# Patient Record
Sex: Female | Born: 1940
Health system: Southern US, Community
[De-identification: ages and names within clinical notes are randomized; demographics above are authoritative.]

## PROBLEM LIST (undated history)

## (undated) DIAGNOSIS — M069 Rheumatoid arthritis, unspecified: Secondary | ICD-10-CM

## (undated) DIAGNOSIS — K279 Peptic ulcer, site unspecified, unspecified as acute or chronic, without hemorrhage or perforation: Secondary | ICD-10-CM

## (undated) DIAGNOSIS — Z9289 Personal history of other medical treatment: Secondary | ICD-10-CM

## (undated) DIAGNOSIS — K635 Polyp of colon: Secondary | ICD-10-CM

## (undated) DIAGNOSIS — Z9221 Personal history of antineoplastic chemotherapy: Secondary | ICD-10-CM

## (undated) DIAGNOSIS — Z79811 Long term (current) use of aromatase inhibitors: Secondary | ICD-10-CM

## (undated) DIAGNOSIS — M1990 Unspecified osteoarthritis, unspecified site: Secondary | ICD-10-CM

## (undated) DIAGNOSIS — E039 Hypothyroidism, unspecified: Secondary | ICD-10-CM

## (undated) DIAGNOSIS — D649 Anemia, unspecified: Secondary | ICD-10-CM

## (undated) DIAGNOSIS — E785 Hyperlipidemia, unspecified: Secondary | ICD-10-CM

## (undated) DIAGNOSIS — I10 Essential (primary) hypertension: Secondary | ICD-10-CM

## (undated) DIAGNOSIS — K7689 Other specified diseases of liver: Secondary | ICD-10-CM

## (undated) DIAGNOSIS — Z923 Personal history of irradiation: Secondary | ICD-10-CM

## (undated) DIAGNOSIS — B9681 Helicobacter pylori [H. pylori] as the cause of diseases classified elsewhere: Secondary | ICD-10-CM

## (undated) DIAGNOSIS — C50919 Malignant neoplasm of unspecified site of unspecified female breast: Secondary | ICD-10-CM

## (undated) DIAGNOSIS — L02429 Furuncle of limb, unspecified: Secondary | ICD-10-CM

## (undated) DIAGNOSIS — I2510 Atherosclerotic heart disease of native coronary artery without angina pectoris: Secondary | ICD-10-CM

## (undated) DIAGNOSIS — G4730 Sleep apnea, unspecified: Secondary | ICD-10-CM

## (undated) DIAGNOSIS — E0500 Thyrotoxicosis with diffuse goiter without thyrotoxic crisis or storm: Secondary | ICD-10-CM

## (undated) DIAGNOSIS — R011 Cardiac murmur, unspecified: Secondary | ICD-10-CM

## (undated) DIAGNOSIS — Z8719 Personal history of other diseases of the digestive system: Secondary | ICD-10-CM

## (undated) DIAGNOSIS — M479 Spondylosis, unspecified: Secondary | ICD-10-CM

## (undated) DIAGNOSIS — K219 Gastro-esophageal reflux disease without esophagitis: Secondary | ICD-10-CM

## (undated) DIAGNOSIS — R7303 Prediabetes: Secondary | ICD-10-CM

## (undated) HISTORY — DX: Atherosclerotic heart disease of native coronary artery without angina pectoris: I25.10

## (undated) HISTORY — PX: PARTIAL HYSTERECTOMY: SHX80

## (undated) HISTORY — DX: Personal history of antineoplastic chemotherapy: Z92.21

## (undated) HISTORY — DX: Personal history of irradiation: Z92.3

## (undated) HISTORY — DX: Essential (primary) hypertension: I10

## (undated) HISTORY — PX: OTHER SURGICAL HISTORY: SHX169

## (undated) HISTORY — DX: Polyp of colon: K63.5

## (undated) HISTORY — PX: CARPAL TUNNEL RELEASE: SHX101

## (undated) HISTORY — DX: Hypothyroidism, unspecified: E03.9

## (undated) HISTORY — PX: CATARACT EXTRACTION, BILATERAL: SHX1313

## (undated) HISTORY — PX: LUMBAR LAMINECTOMY: SHX95

## (undated) HISTORY — DX: Long term (current) use of aromatase inhibitors: Z79.811

## (undated) HISTORY — DX: Sleep apnea, unspecified: G47.30

## (undated) HISTORY — PX: ABDOMINAL HYSTERECTOMY: SHX81

## (undated) HISTORY — PX: CARDIAC CATHETERIZATION: SHX172

## (undated) HISTORY — DX: Furuncle of limb, unspecified: L02.429

---

## 1996-12-21 HISTORY — PX: BREAST BIOPSY: SHX20

## 1998-09-17 ENCOUNTER — Encounter: Payer: Self-pay | Admitting: *Deleted

## 1998-09-17 ENCOUNTER — Ambulatory Visit (HOSPITAL_COMMUNITY): Admission: RE | Admit: 1998-09-17 | Discharge: 1998-09-17 | Payer: Self-pay | Admitting: *Deleted

## 1998-09-24 ENCOUNTER — Encounter: Payer: Self-pay | Admitting: *Deleted

## 1998-09-24 ENCOUNTER — Ambulatory Visit (HOSPITAL_COMMUNITY): Admission: RE | Admit: 1998-09-24 | Discharge: 1998-09-24 | Payer: Self-pay | Admitting: *Deleted

## 1999-01-27 ENCOUNTER — Ambulatory Visit (HOSPITAL_COMMUNITY): Admission: RE | Admit: 1999-01-27 | Discharge: 1999-01-27 | Payer: Self-pay | Admitting: *Deleted

## 1999-01-30 ENCOUNTER — Encounter: Payer: Self-pay | Admitting: *Deleted

## 1999-01-30 ENCOUNTER — Ambulatory Visit (HOSPITAL_COMMUNITY): Admission: RE | Admit: 1999-01-30 | Discharge: 1999-01-30 | Payer: Self-pay | Admitting: *Deleted

## 1999-01-31 ENCOUNTER — Encounter: Payer: Self-pay | Admitting: *Deleted

## 1999-01-31 ENCOUNTER — Ambulatory Visit (HOSPITAL_COMMUNITY): Admission: RE | Admit: 1999-01-31 | Discharge: 1999-01-31 | Payer: Self-pay | Admitting: *Deleted

## 1999-03-17 ENCOUNTER — Ambulatory Visit (HOSPITAL_COMMUNITY): Admission: RE | Admit: 1999-03-17 | Discharge: 1999-03-17 | Payer: Self-pay | Admitting: *Deleted

## 2000-02-11 ENCOUNTER — Emergency Department (HOSPITAL_COMMUNITY): Admission: EM | Admit: 2000-02-11 | Discharge: 2000-02-11 | Payer: Self-pay | Admitting: Emergency Medicine

## 2000-04-15 ENCOUNTER — Encounter: Payer: Self-pay | Admitting: Gynecology

## 2000-04-15 ENCOUNTER — Ambulatory Visit (HOSPITAL_COMMUNITY): Admission: RE | Admit: 2000-04-15 | Discharge: 2000-04-15 | Payer: Self-pay | Admitting: Gynecology

## 2000-10-06 ENCOUNTER — Other Ambulatory Visit: Admission: RE | Admit: 2000-10-06 | Discharge: 2000-10-06 | Payer: Self-pay | Admitting: Gynecology

## 2001-03-23 ENCOUNTER — Ambulatory Visit (HOSPITAL_COMMUNITY): Admission: RE | Admit: 2001-03-23 | Discharge: 2001-03-23 | Payer: Self-pay | Admitting: Gynecology

## 2001-03-23 ENCOUNTER — Encounter: Payer: Self-pay | Admitting: Gynecology

## 2001-10-10 ENCOUNTER — Other Ambulatory Visit: Admission: RE | Admit: 2001-10-10 | Discharge: 2001-10-10 | Payer: Self-pay | Admitting: Gynecology

## 2001-12-26 ENCOUNTER — Encounter: Payer: Self-pay | Admitting: Internal Medicine

## 2001-12-26 ENCOUNTER — Encounter: Admission: RE | Admit: 2001-12-26 | Discharge: 2001-12-26 | Payer: Self-pay | Admitting: Internal Medicine

## 2002-02-17 ENCOUNTER — Emergency Department (HOSPITAL_COMMUNITY): Admission: EM | Admit: 2002-02-17 | Discharge: 2002-02-17 | Payer: Self-pay | Admitting: Emergency Medicine

## 2002-02-17 ENCOUNTER — Encounter: Payer: Self-pay | Admitting: Emergency Medicine

## 2002-04-21 ENCOUNTER — Ambulatory Visit (HOSPITAL_COMMUNITY): Admission: RE | Admit: 2002-04-21 | Discharge: 2002-04-21 | Payer: Self-pay | Admitting: Gynecology

## 2002-04-21 ENCOUNTER — Encounter: Payer: Self-pay | Admitting: Gynecology

## 2003-01-18 ENCOUNTER — Other Ambulatory Visit: Admission: RE | Admit: 2003-01-18 | Discharge: 2003-01-18 | Payer: Self-pay | Admitting: Gynecology

## 2003-11-05 ENCOUNTER — Ambulatory Visit (HOSPITAL_COMMUNITY): Admission: RE | Admit: 2003-11-05 | Discharge: 2003-11-05 | Payer: Self-pay | Admitting: Gynecology

## 2003-12-03 ENCOUNTER — Ambulatory Visit (HOSPITAL_COMMUNITY): Admission: RE | Admit: 2003-12-03 | Discharge: 2003-12-03 | Payer: Self-pay | Admitting: Internal Medicine

## 2004-02-13 ENCOUNTER — Ambulatory Visit (HOSPITAL_COMMUNITY): Admission: RE | Admit: 2004-02-13 | Discharge: 2004-02-13 | Payer: Self-pay | Admitting: Specialist

## 2004-04-07 ENCOUNTER — Other Ambulatory Visit: Admission: RE | Admit: 2004-04-07 | Discharge: 2004-04-07 | Payer: Self-pay | Admitting: Gynecology

## 2004-05-02 ENCOUNTER — Encounter: Admission: RE | Admit: 2004-05-02 | Discharge: 2004-05-02 | Payer: Self-pay | Admitting: Cardiology

## 2004-05-07 ENCOUNTER — Ambulatory Visit (HOSPITAL_COMMUNITY): Admission: RE | Admit: 2004-05-07 | Discharge: 2004-05-07 | Payer: Self-pay | Admitting: Cardiology

## 2004-12-21 DIAGNOSIS — Z9289 Personal history of other medical treatment: Secondary | ICD-10-CM

## 2004-12-21 HISTORY — DX: Personal history of other medical treatment: Z92.89

## 2005-04-08 ENCOUNTER — Ambulatory Visit (HOSPITAL_COMMUNITY): Admission: RE | Admit: 2005-04-08 | Discharge: 2005-04-08 | Payer: Self-pay | Admitting: Specialist

## 2005-04-16 ENCOUNTER — Other Ambulatory Visit: Admission: RE | Admit: 2005-04-16 | Discharge: 2005-04-16 | Payer: Self-pay | Admitting: Gynecology

## 2005-05-08 ENCOUNTER — Ambulatory Visit (HOSPITAL_COMMUNITY): Admission: RE | Admit: 2005-05-08 | Discharge: 2005-05-08 | Payer: Self-pay | Admitting: Gynecology

## 2005-09-18 ENCOUNTER — Ambulatory Visit (HOSPITAL_BASED_OUTPATIENT_CLINIC_OR_DEPARTMENT_OTHER): Admission: RE | Admit: 2005-09-18 | Discharge: 2005-09-18 | Payer: Self-pay | Admitting: Internal Medicine

## 2005-09-27 ENCOUNTER — Ambulatory Visit: Payer: Self-pay | Admitting: Internal Medicine

## 2005-11-02 ENCOUNTER — Ambulatory Visit: Payer: Self-pay | Admitting: Internal Medicine

## 2006-07-28 ENCOUNTER — Inpatient Hospital Stay (HOSPITAL_COMMUNITY): Admission: RE | Admit: 2006-07-28 | Discharge: 2006-08-02 | Payer: Self-pay | Admitting: Orthopedic Surgery

## 2006-07-28 HISTORY — PX: TOTAL KNEE ARTHROPLASTY: SHX125

## 2007-08-23 ENCOUNTER — Encounter: Admission: RE | Admit: 2007-08-23 | Discharge: 2007-08-23 | Payer: Self-pay | Admitting: Gynecology

## 2007-09-05 ENCOUNTER — Other Ambulatory Visit: Admission: RE | Admit: 2007-09-05 | Discharge: 2007-09-05 | Payer: Self-pay | Admitting: Gynecology

## 2007-12-22 HISTORY — PX: BILATERAL OOPHORECTOMY: SHX1221

## 2008-08-14 HISTORY — PX: US ECHOCARDIOGRAPHY: HXRAD669

## 2008-11-09 ENCOUNTER — Encounter: Admission: RE | Admit: 2008-11-09 | Discharge: 2008-11-09 | Payer: Self-pay | Admitting: Internal Medicine

## 2009-08-08 ENCOUNTER — Ambulatory Visit (HOSPITAL_COMMUNITY): Admission: RE | Admit: 2009-08-08 | Discharge: 2009-08-08 | Payer: Self-pay | Admitting: Internal Medicine

## 2009-10-24 ENCOUNTER — Encounter: Admission: RE | Admit: 2009-10-24 | Discharge: 2009-11-22 | Payer: Self-pay | Admitting: Orthopedic Surgery

## 2010-02-21 ENCOUNTER — Ambulatory Visit (HOSPITAL_COMMUNITY): Admission: RE | Admit: 2010-02-21 | Discharge: 2010-02-21 | Payer: Self-pay | Admitting: Cardiology

## 2010-04-10 ENCOUNTER — Ambulatory Visit (HOSPITAL_COMMUNITY): Admission: RE | Admit: 2010-04-10 | Discharge: 2010-04-10 | Payer: Self-pay | Admitting: Cardiology

## 2011-03-10 LAB — BASIC METABOLIC PANEL
BUN: 16 mg/dL (ref 6–23)
CO2: 27 mEq/L (ref 19–32)
Calcium: 9.5 mg/dL (ref 8.4–10.5)
Chloride: 102 mEq/L (ref 96–112)
Creatinine, Ser: 0.87 mg/dL (ref 0.4–1.2)
GFR calc Af Amer: >60 mL/min (ref >60)
GFR calc non Af Amer: >60 mL/min (ref >60)
Glucose, Bld: 127 mg/dL — ABNORMAL HIGH (ref 70–99)
Potassium: 3.4 mEq/L — ABNORMAL LOW (ref 3.5–5.1)
Sodium: 138 mEq/L (ref 135–145)

## 2011-03-10 LAB — CBC
HCT: 39.4 % (ref 36.0–46.0)
Hemoglobin: 13.6 g/dL (ref 12.0–15.0)
MCHC: 34.6 g/dL (ref 30.0–36.0)
MCV: 89.9 fL (ref 78.0–100.0)
Platelets: 270 10*3/uL (ref 150–400)
RBC: 4.38 MIL/uL (ref 3.87–5.11)
RDW: 14.4 % (ref 11.5–15.5)
WBC: 6.2 10*3/uL (ref 4.0–10.5)

## 2011-03-10 LAB — PROTIME-INR
INR: 1.02 (ref 0.00–1.49)
Prothrombin Time: 13.3 seconds (ref 11.6–15.2)

## 2011-03-10 LAB — TSH: TSH: 2.87 u[IU]/mL (ref 0.350–4.500)

## 2011-03-10 LAB — GLUCOSE, CAPILLARY: Glucose-Capillary: 130 mg/dL — ABNORMAL HIGH (ref 70–99)

## 2011-05-08 NOTE — Discharge Summary (Signed)
[name], [name]                   ACCOUNT NO.:  [id number]   MEDICAL RECORD NO.:  [id number]          PATIENT TYPE:  INP   LOCATION:  1516                         FACILITY:  [facility]   PHYSICIAN:  Georges Lynch. Gioffre, M.D.DATE OF BIRTH:  21-Apr-1941   DATE OF ADMISSION:  07/28/2006  DATE OF DISCHARGE:  08/02/2006                                 DISCHARGE SUMMARY   ADMISSION DIAGNOSES:  1. End-stage osteoarthritis right knee.  2. Severe osteoarthritis left knee.  3. Asthma.  4. Hypertension.  5. Reflux disease.  6. Graves disease.   DISCHARGE DIAGNOSES:  1. Right total knee arthroplasty.  2. Postoperative blood loss anemia; corrected with 2 units of packed red      blood cells.  3. Severe osteoarthritis left knee.  4. Asthma.  5. Hypertension.  6. Reflux disease.  7. Graves disease.   HISTORY OF PRESENT ILLNESS:  The patient is a 70 year old female who has  been a patient of Dr. Jeannetta Ellis for a long period of time.  She has been  treated by conservative measures for severe bilateral knee osteoarthritis.  The patient has failed conservative treatment with her right knee and would  like to proceed with a total knee arthroplasty.  She has pain with range of  motion, ambulation, and grinding in the knee.   ALLERGIES:  Zocor.   CURRENT MEDICATIONS:  1. Aspirin 81 mg a day.  2. Benicar 20 mg a day.  3. Lipitor 20 mg a day.  4. Taztia 240 a day.  5. Prevacid.  6. Triamterene/hydrochlorothiazide 37.5/25 mg a day.  7. Tapazole 10 mg 1 tablet t.i.d.  8. Ativan.  9. Albuterol.   SURGICAL PROCEDURE:  On July 28, 2006 the patient was taken to the OR by  Dr. Ranee Gosselin assisted by Jamelle Rushing, P.A.-C.  Patient underwent a  right total knee arthroplasty under general anesthesia without any  complications with 50 mL of estimated blood loss.  The patient had the  following components implanted.  A size 4 femoral component. A size 4 keel  tibial tray.  A size 35-mm, 3-peg  patella.  A size 17.5 polyethelene  component.  All components were implanted with polymethyl methacrylate with  vancomycin impregnated.  The patient tolerated the procedure well and was  transferred to the recovery room to the orthopedic floor in good condition  with the total knee protocol.   CONSULTS:  The following routine consults were performed:  Physical therapy,  occupational therapy, and case management.   HOSPITAL COURSE:  On July 28, 2006 the patient was admitted to [facility] under the care of Dr. Ranee Gosselin.  The patient was taken to the  OR where a right total knee arthroplasty was performed without  complications.  The patient was transferred to the recovery room and then to  the orthopedic floor in good condition on total knee protocol, IV  antibiotics, pain medicines, Coumadin, and heparin for DVT prophylaxis.  The  patient had a total of 5 days postoperative course in which the patient did  have some  significant postoperative blood loss anemia.  Her hemoglobin  dropped to 7.8.  She was typed and crossed and transfused 2 units of packed  red blood cells.  Her hemoglobin improved to 9.5 without any untoward  events.  The patient's vital signs remained stable.  She had no other  symptomatic issues.  The patient's wound remained benign for any signs of  infection.  The leg remained neuromotorvascularly intact.  The patient  worked well with physical therapy.  The patient was able to transition from  IV medications to p.o. meds well without any issues.  It was felt on postop  day #5 that the patient was orthopedically and medically stable, and ready  for discharge home and she was discharged with outpatient home health  physical therapy per protocol.   LABS:  CBC on August 11 found her hemoglobin at 7.8.  She was typed,  crossed, and transfused 2 units of packed red blood cells.  The next day she  was up to 9.5 and 28.1.  INR on August 13 was 2.2.  Routine  chemistries on  admission: Sodium 138, potassium 3.9, __________ 15, BUN 29, creatinine  1.11.  Urinalysis preop and postop were normal.  Culture found 15,000  colonies, multiple bacteria.  The patient was typed, crossed, and transfused  2 units of packed red blood cells.  Postoperative knee x-ray showed good  position and alignment following total knee arthroplasty.   MEDICATIONS UPON DISCHARGE:  1. __________ 150 mg a day.  2. Zocor 40 mg a day.  3. Diltiazem 240 mg a day.  4. Protonix 40 mg a day.  5. Maxzide 1 tablet a day.  6. Albuterol 2 puffs a day.  7. Reglan 10 mg q.6 h. p.r.n.  8. Ferrous sulfate 325 mg p.o. t.i.d.  9. Percocet 1-2 tablets q.4-6 h. p.r.n.  10.Robaxin 500 mg p.o. q.6 h. p.r.n.  11.Advair 1 puff b.i.d.  12.Tapazole 10 mg p.o. t.i.d.  13.Lipitor 20 mg a day.  14.Tylenol 650 mg p.o. q.6 h. p.r.n.  15.Laxative or enema of choice p.r.n.  16.Coumadin 5 mg a day.   DISCHARGE INSTRUCTIONS:  1. Diet--no restrictions.  2. Activity--the patient is to be ambulating with the use of a walker and      with total knee safety protocol.  3. Wound care--the patient should change dressing daily.   MEDICATION ADDITION:  The patient is to continue meds as dispensed prior to  hospitalization, with the addition of:  1. Percocet 1 or 2 tablets q.4-6 h.  2. Robaxin 500 mg 1 tablet q.6 h. p.r.n.  3. Coumadin 5 mg a day unless changed by home health physical therapy.   FOLLOWUP:  1. The patient should followup with Dr. Darrelyn Hillock 2 weeks from date of      surgery, please call for an appointment.  2. Outpatient home health physical therapy provided by Genevieve Norlander per      physical therapy and Coumadin management per protocol.  3. The patient's condition on discharge home is improved and good.      Jamelle Rushing, P.A.    ______________________________  Georges Lynch Darrelyn Hillock, M.D.   RWK/MEDQ  D:  08/11/2006  T:  08/12/2006  Job:  [id number]

## 2011-05-08 NOTE — Op Note (Signed)
[name], [name]                   ACCOUNT NO.:  [id number]   MEDICAL RECORD NO.:  [id number]          PATIENT TYPE:  INP   LOCATION:  0002                         [facility]ACILITY:  [facility]   PHYSICIAN:  Georges Lynch. Gioffre, M.D.DATE OF BIRTH:  05-30-1941   DATE OF PROCEDURE:  DATE OF DISCHARGE:                                 OPERATIVE REPORT   DATE OF SURGERY:  07/28/2006.   SURGEON:  Georges Lynch. Darrelyn Hillock, M.D.   ASSISTANT:  Darrol Poke, PA.   PREOPERATIVE DIAGNOSES:  Severe degenerative arthritis, right knee.   POSTOPERATIVE DIAGNOSES:  Severe degenerative arthritis, right knee.   PROCEDURE PERFORMED:  Right total knee arthroplasty utilizing DePuy system,  all three components were cemented.  The tibial tray was a size 4, the  femoral component was a size 4, right posterior cruciate sacrificing type.  The polyethylene insert was a size 4 rotating platform 17.5 mm thickness.  The patella was a size 35 patella.  Vancomycin was used in the cement.   PROCEDURE:  Under general anesthesia, routine orthopedic prepping and  draping of the right lower extremity was carried out.  She had 1 gram of IV  Ancef.  At this time the leg was exsanguinated with an Esmarch.  Tourniquet  was elevated to 400 mm/Hg.  At this time, an incision was made over the  anterior aspect of the right knee.  Bleeders were identified, cauterized.  We then inserted self retaining retractors, carried out a median  parapatellar incision.  Following that, we reflected the patella laterally,  flexed the knee and did medial and lateral meniscectomies.  We also did an  extensive synovectomy and also removed multiple large spurs over the  patella, femur and tibia.  Once this was completed, we made our initial  drill hole in the intercondylar notch.  #1 jig was inserted. We removed 12  mm thickness off the distal femur because of a flexion contracture.  After  this cut was made, we then inserted our #2 jig for a size 4 right  femur  after the appropriate measurements were taken, and made our appropriate  anterior and posterior Chamfer cuts.  Following that, we the prepared the  tibia in the usual fashion.  Our key hole cut was made.  We utilized a size  4 tibial tray.  We then made our cut out of the distal femur in the usual  fashion.  We then inserted our trial components, went through range of  motion, had excellent stability.  We first tried a 15 mm.  It was somewhat  loose.  Then we utilized at 17.5 mm thickness which fit quite nicely.  We  then prepared our patella.  We measured the patella to be a size 35.  We  removed the appropriate amount of bone from the patella for resurfacing.  At  this time three drill holes then were made in the patella in the usual  fashion for a 35 mm patella.  All trial components were removed, thoroughly  Water-Picked out the knee.  We mixed Vancomycin with the cement, dried  the  knee out and then cemented all three components in simultaneously, removed  all loose pieces of cement.  We then went through trials again with a 17.5  mm thickness insert and it really fit quit nicely.  We had good stability,  good flexion, good extension.  We then inserted the Hemovac drain which was  later pulled out at the end of the procedure.  We then closed the wound in  layers in the usual fashion.  Skin was closed with metal staples, sterile  Neosporin dressing was applied.           ______________________________  Georges Lynch Darrelyn Hillock, M.D.     RAG/MEDQ  D:  07/28/2006  T:  07/28/2006  Job:  [id number]

## 2011-05-08 NOTE — H&P (Signed)
[name], [name]                   ACCOUNT NO.:  [id number]   MEDICAL RECORD NO.:  [id number]          PATIENT TYPE:  INP   LOCATION:  NA                           FACILITY:  [facility]   PHYSICIAN:  Georges Lynch. Gioffre, M.D.DATE OF BIRTH:  1941-06-15   DATE OF ADMISSION:  07/28/2006  DATE OF DISCHARGE:                                HISTORY & PHYSICAL   CHIEF COMPLAINT:  Painful right knee.   HISTORY OF PRESENT ILLNESS:  The patient is a 70 year old female who has  been a patient of Dr. Jeannetta Ellis for a long period of time.  He has been  monitoring and treating her for severe bilateral arthritis.  She has pain  with ambulation, range of motion, she does have grinding.  She is currently  wishing to proceed with a total knee arthroplasty due to her arthritic  issues in her right knee, which is severely symptomatic, her left has got  bad arthritis, but it is not as symptomatic.   ALLERGIES:  ZOCOR.   CURRENT MEDICATIONS:  1.  Aspirin 81 mg a day, currently on hold.  2.  Benicar 20 mg a day.  3.  Lipitor 20 mg a day.  4.  Taztia 240 mg a day, generic for Cardizem.  5.  Prevacid 30 mg a day.  6.  Triamterene/hydrochlorothiazide 37.5/25 mg a day.  7.  Tapazole 10 mg one tablet t.i.d., a Synthroid type medication.  8.  Ativan p.r.n.  9.  Albuterol p.r.n.   CURRENT MEDICAL HISTORY:  1.  Severe osteoarthritis bilateral knees.  2.  Asthma.  3.  Hypertension.  4.  Graves' disease.   PAST SURGICAL HISTORY:  1.  Lumbar surgery x2.  2.  Hysterectomy.  3.  Bilateral cataracts.  4.  Patient denies any complications with the above-mentioned surgical      procedures.   SOCIAL HISTORY:  Patient's father is deceased from an MI, patient's mother  is deceased from complications of CHF, patient has got two brothers with  coronary artery disease and CABGs, one sister with diabetes.   SOCIAL HISTORY:  Patient is not married.  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  She does not smoke or use  alcohol.  She has no children.  She lives in  a one story house.   PRIMARY CARE PHYSICIAN:  Mark A. Perini, M.D.   PULMONOLOGIST:  Cristal Deer, M.D.   REVIEW OF SYSTEMS:  Positive for occasional flare-up of her asthma related  to dust or cologne type sprays.  She has recently had her blood pressure  medications adjusted due to a drop in blood pressure when she uses all her  meds at the same time.  She is currently adjusting medications for her  Graves' disease.  Patient otherwise denies any cardiac, GI, GU, neurologic,  hematologic, pulmonary or endocrine issues that were not indicated above or  related to her current medications.   PHYSICAL EXAM:  VITALS:  Height is 5 feet 5 inches, weight is 262, blood  pressure is 162/78.  Patient indicates she did  not take her blood pressure  medication today.  Pulse of 84 and regular, respirations 12, patient is  afebrile.  GENERAL:  This is a heavyset female conscious, alert and appropriate.  She  does walk with a significant right-sided, right-to-left direction, swaying  type ambulation.  She gets herself on and off the exam table fairly easy  without any obvious distress.  HEENT:  Head is normocephalic.  Pupils are equal, round and reactive.  Extraocular movements intact.  Sclera is slightly icteric.  Oral buccal  mucosa is pink and moist without lesions.  NECK:  Supple with no palpable lymphadenopathy.  She was slightly sore over  the thyroid region but there was no goiter type masses in there.  She had  good range of motion of her cervical spine.  CHEST:  Lung sounds were clear and equal bilaterally.  No wheezes, rales,  rhonchi.  HEART:  Regular rate and rhythm.  No murmurs, rubs or gallops.  ABDOMEN:  Soft, nontender, no CVA region tenderness.  EXTREMITIES:  Upper extremities were symmetrically sized and shaped.  She  had good range of motion of both shoulders.  Motor strength was 5/5.  LOWER EXTREMITIES:  Right and left hip had  full extension, flexion up to 120  degrees with 20 degrees internal and external rotation without any  discomfort.  Bilateral knees were symmetrically sized and shaped, no signs  of erythema or ecchymosis, they were slightly swollen.  She had no effusion,  she was tender along the joint lines on both knees.  She had full extension.  Left knee she could bend back to about 100 degrees, right knee about 95  degrees.  She did have about 5-10 degrees of valgus, varus laxity with both  knees, no instability, the calves were nontender.  Ankles were symmetrical  with good __________ flexion.  PERIPHERAL VASCULAR:  Carotid pulses were 2+ __________ radial pulses were  2+, dorsalis pedis pulses were 2+.  She had no sign of pigmentation changes  or significant varicosities in the lower extremities.  NEURO:  The patient was conscious, alert and appropriate, good historian.  She had no other gross neurologic defects noted.  BREAST/RECTUM AND GU EXAMS:  Deferred at this time.   IMPRESSION:  1.  End-stage osteoarthritis, symptomatic, right knee.  2.  Severe osteoarthritis, left knee.  3.  Asthma.  4.  Hypertension.  5.  Reflux disease.  6.  Graves disease.   PLAN:  The patient has been evaluated by Dr. Waynard Edwards and cleared for this  upcoming surgical procedure.  She has been to [facility] for  preadmission History and Physical at this time with lab work.  The patient  will undergo a right total knee arthroplasty by Dr. Darrelyn Hillock on July 28, 2006, at [facility].      Jamelle Rushing, P.A.    ______________________________  Georges Lynch. Darrelyn Hillock, M.D.    RWK/MEDQ  D:  07/23/2006  T:  07/23/2006  Job:  [id number]   cc:   Windy Fast A. Darrelyn Hillock, M.D.  Fax: [phone]

## 2011-05-08 NOTE — Cardiovascular Report (Signed)
NAME:  MINETTE, MANDERS                             ACCOUNT NO.:  [id number]   MEDICAL RECORD NO.:  [id number]                   PATIENT TYPE:  OIB   LOCATION:  2899                                 FACILITY:  [facility]   PHYSICIAN:  Madaline Savage, M.D.             DATE OF BIRTH:  [birth year]   DATE OF PROCEDURE:  05/07/2004  DATE OF DISCHARGE:                              CARDIAC CATHETERIZATION   PROCEDURES PERFORMED:  1. Selective coronary angiography via Judkins technique.  2. Retrograde left heart catheterization.  3. Left ventricular angiography.  4. Abdominal aortography.  5. Right percutaneous femoral Perclose arteriotomy.   COMPLICATIONS:  None.   ENTRY SITE:  Right femoral.   DYE USED:  Omnipaque.   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  She has  a strong family history of coronary disease including a brother who has had  left anterior descending coronary artery stenting.  She also is obese and  had a recent Cardiolite stress test that changed from being normal several  years ago to showing anterobasal and anteroapical ischemia.  The patient  does not have chest pain.  Because of this new development on her stress  test and her family history and risk factors, the patient was brought  electively for outpatient cardiac catheterization today with an eye towards  possible intervention.  This procedure was performed without complication.   RESULTS:   PRESSURES:  Left ventricular pressure was 150/13, end-diastolic pressure 24.  Central aortic pressure 145/65, mean of 101.  No aortic valve gradient by  pullback technique.   ANGIOGRAPHIC RESULTS:  1. The patient's coronary arteries were normal.  2. The LAD gave rise to three septal perforator branches, one medium size     diagonal branch and the LAD wrapped around the apex and all vessels in     the LAD system and its  branches were normal.  3. The circumflex was nondominant bifurcating distally into two posterior     lateral branches.  No lesions were seen.  4. The right coronary artery was large and dominant giving rise to two acute     marginal branches and a posterior descending branch and a bifurcating     posterior lateral branch.  No lesions were seen.   The left ventricular angiogram showed normal contractility with ejection  fraction estimate of 65%.  No wall motion abnormalities.  No LV thrombus.  No mitral regurgitation.   Abdominal aortography showed normal abdominal aorta, normal renal arteries.   FINAL DIAGNOSES:  1. Angiographic patent coronary arteries.  2. Normal left ventricular systolic function.  3. False-positive Persantine Cardiolite stress test.  4. Normal abdominal aorta and renal arteries.   PLAN:  The patient is reassured.  She will be released in two hours and have  a groin followup  in the next 48 hours.  She will also followup her diabetic  care to Dr. Adrian Prince.                                               Madaline Savage, M.D.    WHG/MEDQ  D:  05/07/2004  T:  05/07/2004  Job:  [id number]   cc:   Tera Mater. Evlyn Kanner, M.D.  [address]  [place]  [place] [address]  Fax: [phone]

## 2011-05-08 NOTE — Procedures (Signed)
NAME:  [name], [name]                   ACCOUNT NO.:  [id number]   MEDICAL RECORD NO.:  [id number]          PATIENT TYPE:  OUT   LOCATION:  SLEEP CENTER                 FACILITY:  [facility]   PHYSICIAN:  Clinton D. Maple Hudson, M.D. DATE OF BIRTH:  1941-08-31   DATE OF STUDY:  09/18/2005                              NOCTURNAL POLYSOMNOGRAM   REFERRING PHYSICIAN:  Dr. Rodrigo Ran   DATE OF STUDY:  September 18, 2005   INDICATION FOR STUDY:  Hypersomnia with sleep apnea. Epworth sleepiness  score 7/24, BMI 42, weight 255 pounds.   SLEEP ARCHITECTURE:  Total sleep time 309 minutes with sleep efficiency 80%.  Stage I was 6%, stage II 60%, stages III and IV 26%, REM 9% of total sleep  time. Sleep latency 4 minutes, REM latency 178 minutes, awake after sleep  onset 76 minutes, arousal index 26. No bedtime medication taken.   SLEEP ARCHITECTURE:  Split study protocol. Apnea hypopnea index (AHI, RDI)  14.4 obstructive events per hour indicating mild to moderate obstructive  sleep apnea/hypopnea syndrome. There were two obstructive apneas and 30  hypopneas before CPAP. Most events were recorded while on left or right  sides. REM AHI 2.3 per hour. CPAP was titrated to 12 CWP, AHI 0 per hour. A  small Respironics ComfortGel nasal mask was used with heated humidifier.   OXYGEN DATA:  Moderate to loud snoring with an oxygen desaturation nadir of  84% before CPAP. After CPAP control snoring was prevented and oxygen  saturation held 97-98% on room air.   CARDIAC DATA:  Normal sinus rhythm.   MOVEMENT/PARASOMNIA:  Occasional leg jerk with little effect on sleep.   IMPRESSION/RECOMMENDATION:  1.  Mild to moderate obstructive sleep apnea/hypopnea syndrome, apnea-      hypopnea index of 14.4 per hour with moderate snoring and oxygen      desaturation to 84%. Events were not positional.  2.  Successful continuous positive airway pressure titration to 12 CWP,      apnea-hypopnea index O per hour. A small  Respironics ComfortGel nasal      mask was used with heated humidifier.      Clinton D. Maple Hudson, M.D.  Diplomate, Biomedical engineer of Sleep Medicine  Electronically Signed     CDY/MEDQ  D:  09/27/2005 09:12:17  T:  09/27/2005 04:54:09  Job:  [id number]

## 2011-10-28 ENCOUNTER — Ambulatory Visit
Admission: RE | Admit: 2011-10-28 | Discharge: 2011-10-28 | Disposition: A | Discharge status: Home / Self Care | Payer: Medicare Other | Source: Ambulatory Visit | Attending: Gynecology | Admitting: Gynecology

## 2011-10-28 ENCOUNTER — Other Ambulatory Visit: Payer: Self-pay | Admitting: Gynecology

## 2011-10-28 DIAGNOSIS — C50919 Malignant neoplasm of unspecified site of unspecified female breast: Secondary | ICD-10-CM

## 2011-10-28 DIAGNOSIS — N6315 Unspecified lump in the right breast, overlapping quadrants: Secondary | ICD-10-CM

## 2011-10-28 HISTORY — DX: Malignant neoplasm of unspecified site of unspecified female breast: C50.919

## 2011-10-29 ENCOUNTER — Other Ambulatory Visit: Payer: Self-pay | Admitting: Gynecology

## 2011-10-29 DIAGNOSIS — C50911 Malignant neoplasm of unspecified site of right female breast: Secondary | ICD-10-CM

## 2011-11-02 ENCOUNTER — Other Ambulatory Visit: Payer: Self-pay | Admitting: *Deleted

## 2011-11-02 ENCOUNTER — Telehealth: Payer: Self-pay | Admitting: *Deleted

## 2011-11-02 DIAGNOSIS — C50919 Malignant neoplasm of unspecified site of unspecified female breast: Secondary | ICD-10-CM

## 2011-11-02 NOTE — Telephone Encounter (Signed)
Confirmed [facility] for 11/04/11 at 1215 .  Instructions and contact information given.

## 2011-11-03 ENCOUNTER — Ambulatory Visit
Admission: RE | Admit: 2011-11-03 | Discharge: 2011-11-03 | Disposition: A | Discharge status: Home / Self Care | Payer: Medicare Other | Source: Ambulatory Visit | Attending: Gynecology | Admitting: Gynecology

## 2011-11-03 DIAGNOSIS — C50911 Malignant neoplasm of unspecified site of right female breast: Secondary | ICD-10-CM

## 2011-11-03 MED ORDER — GADOBENATE DIMEGLUMINE 529 MG/ML IV SOLN
20.0000 mL | Freq: Once | INTRAVENOUS | Status: AC | PRN
  Administered 2011-11-03: 20 mL via INTRAVENOUS

## 2011-11-04 ENCOUNTER — Ambulatory Visit: Payer: Medicare Other | Attending: General Surgery | Admitting: Physical Therapy

## 2011-11-04 ENCOUNTER — Ambulatory Visit: Payer: Medicare Other

## 2011-11-04 ENCOUNTER — Encounter: Payer: Self-pay | Admitting: Oncology

## 2011-11-04 ENCOUNTER — Telehealth: Payer: Self-pay | Admitting: *Deleted

## 2011-11-04 ENCOUNTER — Other Ambulatory Visit (HOSPITAL_BASED_OUTPATIENT_CLINIC_OR_DEPARTMENT_OTHER): Payer: Medicare Other | Admitting: Lab

## 2011-11-04 ENCOUNTER — Ambulatory Visit (HOSPITAL_BASED_OUTPATIENT_CLINIC_OR_DEPARTMENT_OTHER): Payer: Medicare Other | Admitting: Oncology

## 2011-11-04 ENCOUNTER — Ambulatory Visit
Admission: RE | Admit: 2011-11-04 | Discharge: 2011-11-04 | Disposition: A | Discharge status: Home / Self Care | Payer: Medicare Other | Source: Ambulatory Visit | Attending: Radiation Oncology | Admitting: Radiation Oncology

## 2011-11-04 ENCOUNTER — Encounter (INDEPENDENT_AMBULATORY_CARE_PROVIDER_SITE_OTHER): Payer: Self-pay | Admitting: General Surgery

## 2011-11-04 ENCOUNTER — Ambulatory Visit (HOSPITAL_BASED_OUTPATIENT_CLINIC_OR_DEPARTMENT_OTHER): Payer: Medicare Other | Admitting: General Surgery

## 2011-11-04 VITALS — BP 128/69 | HR 97 | Temp 98.6°F | Ht 64.5 in | Wt 237.1 lb

## 2011-11-04 DIAGNOSIS — Z1501 Genetic susceptibility to malignant neoplasm of breast: Secondary | ICD-10-CM

## 2011-11-04 DIAGNOSIS — C50919 Malignant neoplasm of unspecified site of unspecified female breast: Secondary | ICD-10-CM | POA: Insufficient documentation

## 2011-11-04 DIAGNOSIS — C50411 Malignant neoplasm of upper-outer quadrant of right female breast: Secondary | ICD-10-CM

## 2011-11-04 DIAGNOSIS — Z170 Estrogen receptor positive status [ER+]: Secondary | ICD-10-CM

## 2011-11-04 DIAGNOSIS — C50911 Malignant neoplasm of unspecified site of right female breast: Secondary | ICD-10-CM

## 2011-11-04 DIAGNOSIS — Z1502 Genetic susceptibility to malignant neoplasm of ovary: Secondary | ICD-10-CM

## 2011-11-04 DIAGNOSIS — C50419 Malignant neoplasm of upper-outer quadrant of unspecified female breast: Secondary | ICD-10-CM

## 2011-11-04 DIAGNOSIS — IMO0001 Reserved for inherently not codable concepts without codable children: Secondary | ICD-10-CM | POA: Insufficient documentation

## 2011-11-04 HISTORY — DX: Malignant neoplasm of upper-outer quadrant of right female breast: C50.411

## 2011-11-04 LAB — CBC WITH DIFFERENTIAL/PLATELET
Eosinophils Absolute: 0.2 10*3/uL (ref 0.0–0.5)
LYMPH%: 31.5 % (ref 14.0–49.7)
MCH: 30.2 pg (ref 25.1–34.0)
MCHC: 34 g/dL (ref 31.5–36.0)
MCV: 88.8 fL (ref 79.5–101.0)
MONO%: 6 % (ref 0.0–14.0)
NEUT#: 3.4 10*3/uL (ref 1.5–6.5)
Platelets: 299 10*3/uL (ref 145–400)
RBC: 4.5 10*6/uL (ref 3.70–5.45)

## 2011-11-04 LAB — COMPREHENSIVE METABOLIC PANEL
Alkaline Phosphatase: 83 U/L (ref 39–117)
Glucose, Bld: 175 mg/dL — ABNORMAL HIGH (ref 70–99)
Sodium: 139 mEq/L (ref 135–145)
Total Bilirubin: 0.3 mg/dL (ref 0.3–1.2)
Total Protein: 7.6 g/dL (ref 6.0–8.3)

## 2011-11-04 NOTE — Patient Instructions (Signed)
You have a 2.5 cm tumor in the right breast, upper outer quadrant. There is also a suspicious area of thickening behind your right nipple. You may still be a candidate for lumpectomy and sentinel lobe biopsy. The next step in your  care is to see me in the office in a few days and we will do a punch biopsy of the nipple to to determine the extent of your disease. We will discuss options for surgical intervention at that time.

## 2011-11-04 NOTE — Telephone Encounter (Signed)
Gave patient appointment for 12-11-2011.

## 2011-11-04 NOTE — Progress Notes (Signed)
No chief complaint on file.   HPI [name] is a 70 y.o. female.  She is a former [employer]. She is referred by Dr. Cain Saupe for evaluation of a new cancer in the right breast. Dr. Rodrigo Ran is her primary care physician. Dr. Chevis Pretty is her gynecologist. Caprice Kluver is her cardiologist. She is being seen in the breast multidisciplinary clinic by Dr. Roselind Messier and Dr. Welton Flakes and me.  The patient felt a mass in the right breast, upper outer quadrant fairly near to the nipple about 3 weeks ago. She has not had any breast problems in the past. She had mammograms and ultrasound which showed a mass in the upper outer quadrant of the right breast about 3 cm from the nipple. This is 1.9 cm by mammogram and ultrasound. Biopsy showed invasive ductal carcinoma, ER positive, PR week, Ki-67 15%, HER-2-negative. This is a clinical stage T2, N0 tumor.  MRI shows a 2.5 cm area of enhancement in the upper outer quadrant of the right breast. There is also a secondary area of enhancement which extends over to the subareolar and subnipple area which is thickened and felt to be highly suspicious. The maximum dimensions of from the outer edge of the primary tumor and the outer edge of the subareolar density is 7.5 cm. Her breasts are large.  We discussed her case in conference this morning and she is being seen in [facility] today.  Significant medical problems include coronary artery disease, status post cardiac cath x3. Obesity. Asthma. Sleep apnea. Hypertension.  Family history is positive for ovarian cancer in a cousin and a niece. Breast cancer in a cousin. Abdominal carcinomatosis in another cousin. HPI  Past Medical History  Diagnosis Date  . Hyperthyroidism   . Asthma   . Sleep apnea     Past Surgical History  Procedure Date  . Total abdominal hysterectomy   . Lumbar laminectomy   . Lumbar disc surgery   . Total knee arthroplasty   . Cardiac catheterization     No family history on  file.  Social History History  Substance Use Topics  . Smoking status: Never Smoker   . Smokeless tobacco: Not on file  . Alcohol Use: No    Allergies  Allergen Reactions  . Zocor (Simvastatin - High Dose)     Leg cramps    Current Outpatient Prescriptions  Medication Sig Dispense Refill  . albuterol (PROVENTIL) (5 MG/ML) 0.5% nebulizer solution Take by nebulization as needed.        Marland Kitchen aspirin 81 MG tablet Take 81 mg by mouth daily.        . cyclobenzaprine (FLEXERIL) 10 MG tablet Take 10 mg by mouth daily.        Marland Kitchen diltiazem (CARDIZEM CD) 240 MG 24 hr capsule Take 240 mg by mouth daily.        Marland Kitchen ezetimibe (ZETIA) 10 MG tablet Take 10 mg by mouth daily.        . Fluticasone-Salmeterol (ADVAIR) 100-50 MCG/DOSE AEPB Inhale 1 puff into the lungs every 12 (twelve) hours.        . hydrochlorothiazide (HYDRODIURIL) 25 MG tablet Take 25 mg by mouth daily.        . lansoprazole (PREVACID) 30 MG capsule Take 30 mg by mouth daily.        . meloxicam (MOBIC) 7.5 MG tablet Take 5 mg by mouth daily.        . rosuvastatin (CRESTOR) 5 MG  tablet Take 5 mg by mouth daily.          Review of Systems Review of Systems  Constitutional: Negative for fever, chills and unexpected weight change.  HENT: Negative for hearing loss, congestion, sore throat, trouble swallowing and voice change.   Eyes: Negative for visual disturbance.  Respiratory: Positive for shortness of breath. Negative for cough and wheezing.   Cardiovascular: Positive for leg swelling. Negative for chest pain and palpitations.  Gastrointestinal: Negative for nausea, vomiting, abdominal pain, diarrhea, constipation, blood in stool, abdominal distention and anal bleeding.  Genitourinary: Negative for hematuria, vaginal bleeding and difficulty urinating.  Musculoskeletal: Negative for arthralgias.  Skin: Negative for rash and wound.  Neurological: Negative for seizures, syncope and headaches.  Hematological: Negative for adenopathy.  Does not bruise/bleed easily.  Psychiatric/Behavioral: Negative for confusion.    There were no vitals taken for this visit.  Physical Exam Physical Exam  Constitutional: She is oriented to person, place, and time. She appears well-developed and well-nourished. No distress.       obese  HENT:  Head: Normocephalic and atraumatic.  Nose: Nose normal.  Mouth/Throat: No oropharyngeal exudate.  Eyes: Conjunctivae and EOM are normal. Pupils are equal, round, and reactive to light. Left eye exhibits no discharge. No scleral icterus.  Neck: Neck supple. No JVD present. No tracheal deviation present. No thyromegaly present.  Cardiovascular: Normal rate, regular rhythm, normal heart sounds and intact distal pulses.   No murmur heard. Pulmonary/Chest: Effort normal and breath sounds normal. No respiratory distress. She has no wheezes. She has no rales. She exhibits no tenderness.    Abdominal: Soft. Bowel sounds are normal. She exhibits no distension and no mass. There is no tenderness. There is no rebound and no guarding.  Musculoskeletal: She exhibits edema. She exhibits no tenderness.  Lymphadenopathy:    She has no cervical adenopathy.  Neurological: She is alert and oriented to person, place, and time. She exhibits normal muscle tone. Coordination normal.  Skin: Skin is warm. No rash noted. She is not diaphoretic. No erythema. No pallor.  Psychiatric: She has a normal mood and affect. Her behavior is normal. Judgment and thought content normal.    Data Reviewed I have reviewed her x-rays and pathology slides. I have discussed her case and conference this morning. I have consulted with Dr. Welton Flakes and Dr. Roselind Messier for decision making.  Assessment    Invasive ductal carcinoma right breast, upper outer quadrant. ER positive. PR week. Ki-67 15%. HER-2 negative.  Second area of enhancement in subareolar area suggests multifocal disease.  Physical exam suggestsw nipple involvement. Further  biopsy warranted.  Obesity  Sleep apnea  Asthma  Hypertension  Coronary disease.    Plan    She will return to see me in my office in 2 days and we will do a punch biopsy of the nipple. We did not have any equipment in the breast clinic today.  After we obtain those results we will discuss options. Because her breasts are so large we could consider a generous central lumpectomy, although she would probably lose 25% of her breast that she would probably need a left breast reduction for symmetry at some point. Alternatively we could consider mastectomy with or without reconstruction following consultation with plastic surgery.  We will discuss options for surgical intervention at her next office visit and after the biopsy results are known.  I will discuss plans for her care with Dr. Welton Flakes and Dr. Roselind Messier prior to surgery.  Donasia Wimes M 11/04/2011, 3:21 PM

## 2011-11-04 NOTE — Progress Notes (Signed)
CC:   Howard C. Mezer, M.D. Redge Gainer. Waynard Edwards, M.D. Drue Second, M.D. Angelia Mould. Derrell Lolling, M.D.  REFERRING PHYSICIAN:  Leatha Gilding. Mezer, M.D.   HISTORY OF PRESENT ILLNESS:  [name] is a very pleasant 70 year old female who is seen out of the courtesy of Dr. Chevis Pretty as part of the multidisciplinary breast clinic.  Earlier this year, [name] palpated a mass in the upper aspect of her right breast.  She subsequently underwent digital diagnostic bilateral mammography and bilateral breast ultrasound.  In the right breast, there was a spiculated mass with microcalcifications in the right anterior upper outer quadrant.  This measured approximately 2 cm.  In addition, there were fine microcalcifications extending to and into the nipple area from the dominant mass.  There was no abnormality noted in the left breast on mammography or ultrasonography.  The patient subsequently proceeded to undergo biopsy of the right breast mass  which revealed invasive mammary carcinoma, probably grade 2.  E-cadherin stain was performed which confirmed it to be positive, confirming the ductal nature of the malignancy.  The tumor was estrogen receptor positive at 100% and progesterone receptor positive at 3% with moderate staining intensity. Ki-67 was elevated at 38%.  In light of the above findings, the patient proceeded to undergo an MRI of the breast/chest area.  Within the right breast, the patient was noted to have a 2.5 x 2.4 x 1 cm biopsy-proven invasive ductal carcinoma.  This was located in the anterior aspect of the upper outer quadrant of the breast.  In addition, there was associated linear and nodular enhancing masses extending from the mass into the nipple-areolar complex area with abnormal thickening and enhancement of the nipple and areola area.  This was suspicious for tumor extension into the nipple-areolar complex area.  Maximum diameter of the combined area was large at 7.5 cm.  With the above  findings, the patient is now seen in the multidisciplinary breast clinic.  ALLERGIES:  The patient has an allergy to Zocor, which causes extreme leg cramps.  CURRENT MEDICATIONS:  Include Proventil inhaler, Advair, hydrochlorothiazide, Prevacid, Crestor, Zetia, aspirin, diltiazem, Mobic, Flexeril; doses are recorded in the electronic medical record.  MEDICAL HISTORY:  Significant for hyperthyroidism, asthma, sleep apnea. Prior surgeries include a hysterectomy, as well as bilateral ovarian removal.  The patient has had a prior lumbar laminectomy, discectomy, right total knee replacement.  She has had cardiac catheterization x3, which has been negative.  SOCIAL HISTORY:  The patient is a retired Nutritional therapist.  She worked approximately 35 years at [employer].  There is no history of tobacco use or alcohol intake.  The patient is single.  The patient is accompanied by her niece on evaluation today.  FAMILY HISTORY:  Significant for a niece with a history of ovarian cancer, first cousin with a history of breast cancer with subsequent spread to the brain, also significant for another first cousin with a history of ovarian cancer in her 66s to 34s, and also a questionable history of pancreatic cancer in a first cousin.  REVIEW OF SYSTEMS:  As above, the patient palpated the area of concern on self-exam.  The patient denied any nipple discharge or bleeding prior to diagnosis.  In addition, the patient did notice some changes in her nipple area which prompted the initial self-evaluation.  The patient denies any nipple discharge or bleeding from either breast.  The patient denies any new bony pain, headaches, dizziness or blurred vision.  A complete review of  systems is undertaken with the patient, reviewed, and placed in the electronic medical record.  PHYSICAL EXAMINATION:  General: This is a very pleasant 70 year old female in no acute distress.  Lymphatic:  Examination of the neck  and supraclavicular region reveals no evidence of adenopathy.  The axillary areas are free of adenopathy.  Respiratory:  Examination of the lungs reveals them to be clear.  Cardiovascular:  The heart has a regular rhythm and rate.  Breasts:  Examination of the left breast reveals it to be large and pendulous without mass or nipple discharge.  Examination of the right breast reveals a palpable mass in the upper outer quadrant adjacent to the areola border.  This is estimated to be approximately 3 x 3 cm in size.  In addition, there is a small nodule along the areolar area adjacent to this mass which is estimated to be approximately 2 to 3 mm.  The right nipple area does show some slight enlargement compared the left side, and it is somewhat firm and suspicious for possible tumor infiltration.  There is no nipple discharge or bleeding noted.  IMPRESSION AND PLAN:  Invasive ductal carcinoma of the right breast.  As above, on MRI, the patient may have an area of involvement extending over approximately 7 cm.  To further evaluate this issue, the patient will be seeing Dr. Derrell Lolling in the near future for a punch biopsy of the nipple area.  Depending on the results of this biopsy, additional therapy may be recommended.  Given the patient's rather large breast size, she could still proceed with breast conserving therapy with removal of the dominant mass within the central portion of the breast, as well as the nipple areolar complex area, if this is the patient's desire.  Alternatively, the patient could proceed with mastectomy as part of her primary local management.  Final details concerning patient's management are pending additional biopsy, as above.  At this point, I am unsure whether [name] would prefer to proceed with breast conserving therapy versus a mastectomy.    ______________________________ Billie Lade, Ph.D., M.D. JDK/MEDQ  D:  11/04/2011  T:  11/04/2011  Job:  [id number]

## 2011-11-05 ENCOUNTER — Encounter: Payer: Self-pay | Admitting: *Deleted

## 2011-11-05 NOTE — Progress Notes (Signed)
Mailed after appt letter to pt. 

## 2011-11-06 ENCOUNTER — Ambulatory Visit (INDEPENDENT_AMBULATORY_CARE_PROVIDER_SITE_OTHER): Payer: Medicare Other | Admitting: General Surgery

## 2011-11-06 ENCOUNTER — Encounter (INDEPENDENT_AMBULATORY_CARE_PROVIDER_SITE_OTHER): Payer: Self-pay | Admitting: General Surgery

## 2011-11-06 ENCOUNTER — Other Ambulatory Visit (INDEPENDENT_AMBULATORY_CARE_PROVIDER_SITE_OTHER): Payer: Self-pay | Admitting: General Surgery

## 2011-11-06 VITALS — BP 142/78 | HR 80 | Temp 96.6°F | Resp 18 | Ht 66.0 in | Wt 238.0 lb

## 2011-11-06 DIAGNOSIS — C50919 Malignant neoplasm of unspecified site of unspecified female breast: Secondary | ICD-10-CM

## 2011-11-06 DIAGNOSIS — C50911 Malignant neoplasm of unspecified site of right female breast: Secondary | ICD-10-CM

## 2011-11-06 NOTE — Patient Instructions (Signed)
The biopsy report on the right nipple should be available next Monday or Tuesday. Keep your appointment with Dr. Konrad Dolores, November 21 to decide about further treatments. And if you will  get preoperative chemotherapy or postop chemotherapy. I will see you back in the office in about 10-12 days for final planning. You may take a shower starting tomorrow. We will remove the suture at the next office visit.

## 2011-11-06 NOTE — Progress Notes (Signed)
Subjective:     Patient ID: [name], female   DOB: 1941/05/06, 70 y.o.   MRN: [id number]  HPI I saw this patient in the breast multidisciplinary clinic 2 days ago. She has large breasts and a 2.5 cm area in the upper-outer quadrant of the right breast which was biopsied and showed invasive ductal carcinoma. She also had a very suspicious area of enhancement immediately behind the nipple and areolar complex. We can feel a firmness and enlargement of the nipple although there is no ulceration. She is in the office today for biopsy of the nipple areola complex.  Review of Systems 12 system review of systems is performed and is negative except as described above.    Objective:   Physical Exam Constitutional:  The patient looks well and is in no distress.  Breasts:    the right breast is examined. Once again I can feel about a 3 cm area in the upper outer quadrant of the right breast about 3 or 4 cm away from the areolar margin. The nipple is enlarged and firm and little bit nodular but there is no drainage or ulceration.  The right breast was prepped with Betadine and anesthetized with 1% plain Xylocaine. I took a large punch biopsied at the base of the nipple at about the 8:00 position and got a nice biopsy. The skin was closed with a nylon suture. It was no bleeding. Neosporin ointment and a dry bandage was placed. She tolerated this very well.    Assessment:     Invasive ductal carcinoma right breast, upper outer quadrant, ER positive. PR week. Ki-67 50%. Her tonegative.  Suspect secondary area  of invasive cancer This suggests either confluent or multifocal disease.  Obesity  Sleep apnea  Asthma  Hypertension  Coronary artery disease.    Plan:     Right nipple biopsy performed today. Pathology report should be out Monday or Tuesday and we will call the patient with this.  Wound care advice discussed and written instructions given to patient  The patient will see Dr. Drue Second on November 21. At that time further decisions can be made as to whether she gets neoadjuvant chemotherapy, or whether we proceed with surgery first  and she gets postop adjuvant chemotherapy.  The patient has appointment to see me on November 26 to be sure that the treatment plan has been finalized.  She is aware that she is a candidate for a generous central partial mastectomy if she chooses, including sentinel node biopsy. She is also a candidate for mastectomy with or without reconstruction she is aware of that. We will discuss that further at the next visit.

## 2011-11-09 ENCOUNTER — Ambulatory Visit: Payer: Medicare Other

## 2011-11-09 ENCOUNTER — Telehealth: Payer: Self-pay | Admitting: *Deleted

## 2011-11-09 ENCOUNTER — Telehealth (INDEPENDENT_AMBULATORY_CARE_PROVIDER_SITE_OTHER): Payer: Self-pay

## 2011-11-09 NOTE — Telephone Encounter (Signed)
Unable to leave a message on home or cell # d/t full in boxes.  Will attempt to reach pt again to discuss [facility] from 11/04/11.

## 2011-11-09 NOTE — Progress Notes (Signed)
Robie Ridge M.D.  [name] M.D. Antony Blackbird M.D. Teodora Medici M.D. 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   REFERRING PHYSICIAN: Dr. Margaretmary Lombard  HISTORY OF PRESENT ILLNESS:  [name] is a 70 y.o. female.  Who is seen in the multidisciplinary breast clinic today with new diagnosis of invasive mammary carcinoma of the right breast. It does not involve the nipple of. Patient began having screening mammograms about 30 years ago. Her last Pap smear was in November 2012. She's also had her colonoscopies performed as well as bone density. Most recently patient felt a mass in her right breast. Because of this she went on to have a diagnostic mammogram performed the mammogram confirmed the mass. And she went on to have an ultrasound-guided biopsy of the mass at the 11:00 position 3 cm from the night from the right nipple. The pathology revealed an invasive mammary carcinoma with memory carcinoma in situ. Compatible with a grade 2. The tumor was estrogen receptor +100% progesterone receptor +3% proliferation marker 38% and HER-2/neu was not amplified. Patient went on to have MRI of the breasts performed was on 11/03/2011. The MRI showed 2.5 x 2.4 x 1.8 cm spiculated enhancing mass in the anterior aspect of the upper outer quadrant of the right breast appear there were also smaller linear and nodular enhancing masses extending to the biopsied mass to the right nipple. There was also diffuse enhancement and diffuse abnormal enhancement of the right nipple and areola. The combined diameter of the masses and abnormal nipple and aarolat enhancement was noted to be 7.5 cm. Patient is now seen in the multidisciplinary breast clinic by myself Dr. Derrell Lolling as well as Dr. Antony Blackbird.   Past Medical History  Diagnosis Date  . Hyperthyroidism    . Asthma   . Sleep apnea     Past Surgical History  Procedure Date  . Total abdominal hysterectomy   . Lumbar laminectomy   . Lumbar disc surgery   . Total knee arthroplasty   . Cardiac catheterization   . Punch biopsy 11/06/11    right breast/ in office    Family History: Patient's niece had ovarian cancer at the age of 54. 2 cousins had ovarian and breast cancer in their 61s. Patient's father deceased at age 95 one mother deceased at age 61. Patient has 2 brothers and one sister. Social History History  Substance Use Topics  . Smoking status: Never Smoker   . Smokeless tobacco: Not on file  . Alcohol Use: No   patient is retired she is single she has one foster daughter Lynann Beaver was 62 and lives in [place]. Patient is G0 P0 00.  She has never had a cancer in the past.  Allergies  Allergen Reactions  . Zocor (Simvastatin - High Dose)     Leg cramps    Current Outpatient Prescriptions  Medication Sig Dispense Refill  . albuterol (PROVENTIL) (5 MG/ML) 0.5% nebulizer solution Take by nebulization as needed.        Marland Kitchen aspirin 81 MG tablet Take 81 mg by mouth daily.        . cyclobenzaprine (FLEXERIL) 10 MG tablet Take 10 mg by mouth daily.        Marland Kitchen diltiazem (CARDIZEM CD) 240 MG 24 hr capsule Take 240 mg by mouth daily.        Marland Kitchen  ezetimibe (ZETIA) 10 MG tablet Take 10 mg by mouth daily.        . Fluticasone-Salmeterol (ADVAIR) 100-50 MCG/DOSE AEPB Inhale 1 puff into the lungs every 12 (twelve) hours.        . hydrochlorothiazide (HYDRODIURIL) 25 MG tablet Take 25 mg by mouth daily.        . lansoprazole (PREVACID) 30 MG capsule Take 30 mg by mouth daily.        . meloxicam (MOBIC) 7.5 MG tablet Take 5 mg by mouth daily.        . rosuvastatin (CRESTOR) 5 MG tablet Take 5 mg by mouth daily.        GYN HISTORY: Menarche at age 59. Patient had her last period at about 61 year she has been on hormone replacement therapy for about 4 years she stopped at  70 years of age.  REVIEW OF SYSTEMS:  Constitutional: negative Eyes: negative Ears, nose, mouth, throat, and face: negative Respiratory: negative Cardiovascular: negative Gastrointestinal: negative Genitourinary:negative Integument/breast: negative Hematologic/lymphatic: negative Musculoskeletal:negative Neurological: negative Behavioral/Psych: negative  PHYSICAL EXAMINATION:  ZOX:WRUEA, healthy, no distress, well nourished, well developed and smiling SKIN: skin color, texture, turgor are normal, no rashes or significant lesions HEAD: Normocephalic, No masses, lesions, tenderness or abnormalities EYES: normal, PERRLA, EOMI, Conjunctiva are pink and non-injected EARS: External ears normal OROPHARYNX:no exudate, no erythema, lips, buccal mucosa, and tongue normal and dentition normal  NECK: supple, no adenopathy, no bruits, no JVD, thyroid normal size, non-tender, without nodularity LYMPH:  no palpable lymphadenopathy BREAST: Left breast appears well without any masses nipple discharge or skin changes. Right breast does reveal a palpable mass in the upper-outer quadrant. It are also noted to be changes in the nipple with mild retraction. There is thickening of the skin. LUNGS: clear to auscultation , and palpation, clear to auscultation and percussion HEART: regular rate & rhythm, no murmurs and no gallops ABDOMEN:abdomen soft, non-tender, obese, normal bowel sounds and no masses or organomegaly BACK: Back symmetric, no curvature., No CVA tenderness, Range of motion is normal EXTREMITIES:no edema, no clubbing, no cyanosis  NEURO: alert & oriented x 3 with fluent speech, no focal motor/sensory deficits, gait normal, reflexes normal and symmetric    STUDIES/RESULTS: US Breast Bilateral  10/28/2011  *RADIOLOGY REPORT*  Clinical Data:  The patient has felt a lump in the right upper outer quadrant anteriorly for 1 week.  Her physician felt a lump in the left lower inner quadrant.   DIGITAL DIAGNOSTIC BILATERAL MAMMOGRAM WITH CAD AND BILATERAL BREAST ULTRASOUND:  Comparison:  08/08/2009, 08/23/2007  Findings:  There are scattered fibroglandular densities.  There is a spiculated mass with microcalcifications in the right anterior upper outer quadrant.  Mammographically this measures approximately 2 cm.  There are fine microcalcifications extending to and into the nipple from the mass.  No abnormality is noted on the left. Mammographic images were processed with CAD.  On physical exam, I palpate a 2 cm mass at 11 o'clock 3 cm from the right nipple. No mass is palpated in the left lower inner quadrant.  Ultrasound is performed, showing an irregularly marginated solid mass with posterior shadowing at 11 o'clock 3 cm from the right nipple measuring approximately 1.8 x 1.7 x 1.9 cm.  Sonography of the right axilla demonstrates no abnormal nodes. No abnormality is noted in the left lower inner quadrant.  Findings on the right are highly suspicious for invasive mammary carcinoma.  Biopsy is recommended.  Ultrasound-guided core needle biopsy was discussed  with the patient and she agreed with this plan.  IMPRESSION: Highly suspicious mass and microcalcifications in the right anterior upper outer quadrant.  Ultrasound-guided core needle biopsy is recommended and will be performed and reported separately.  No mammographic or sonographic evidence of malignancy on the left.  BI-RADS CATEGORY 5:  Highly suggestive of malignancy - appropriate action should be taken.  Original Report Authenticated By: [name], M.D.   Mr Breast Bilateral W Wo Contrast  11/03/2011  *RADIOLOGY REPORT*  Clinical Data: Recently diagnosed right breast invasive ductal carcinoma and ductal carcinoma in situ.  BUN and creatinine were obtained on site at [facility] at [address] Results:  BUN 13 mg/dL,  Creatinine 0.9 mg/dL.  BILATERAL BREAST MRI WITH AND WITHOUT CONTRAST  Technique: Multiplanar,  multisequence MR images of both breasts were obtained prior to and following the intravenous administration of 20ml of MultiHance.  Three dimensional images were evaluated at the independent DynaCad workstation.  Comparison:  Previous examinations, including the recent mammogram, ultrasound and biopsy examinations.  Findings: Minimal background parenchymal enhancement in both breasts.  2.5 x 2.4 x 1.8 cm spiculated enhancing mass in the anterior aspect of the upper outer quadrant of the right breast.  This contains a biopsy marker clip artifact and corresponds to the recently biopsied invasive ductal carcinoma and ductal carcinoma in situ. This has predominately persistent and plateau enhancement kinetics. There are smaller linear and nodular enhancing masses extending from the biopsied mass to the right nipple.  There is also diffuse enlargement and diffuse abnormal enhancement of the right nipple and areola.  The maximum combined diameter of the masses and abnormal nipple and areola enhancement is 7.5 cm.  No additional masses or areas of enhancement suspicious for malignancy in either breast.  No abnormal appearing lymph nodes.  IMPRESSION:  1.  2.5 x 2.4 x 1.7 cm biopsy-proven invasive ductal carcinoma and ductal carcinoma in situ in the anterior aspect of the upper outer quadrant of the right breast.  There are associated linear and nodular enhancing masses extending from this mass to the nipple/areolar complex with abnormal thickening and enhancement of the nipple and areola, compatible with extension of malignancy into the nipple/areolar complex.  The maximum diameter of the combined area of malignancy is 7.5 cm. 2.  Otherwise, unremarkable examination.  THREE-DIMENSIONAL MR IMAGE RENDERING ON INDEPENDENT WORKSTATION:  Three-dimensional MR images were rendered by post-processing of the original MR data on an independent workstation.  The three- dimensional MR images were interpreted, and findings were  reported in the accompanying complete MRI report for this study.  BI-RADS CATEGORY 6:  Known biopsy-proven malignancy - appropriate action should be taken.  Recommendation:  Treatment plan.  Original Report Authenticated By: Darrol Angel, M.D.   [place] Core Biopsy  10/30/2011  **ADDENDUM** CREATED: 10/30/2011 16:38:27  I spoke with the patient by telephone on 10/30/2011 at 4:45 p.m. to discuss pathology results at her request.  Pathology demonstrates invasive ductal carcinoma and DCIS, which is concordant with the imaging appearance.  She reports no problems at the biopsy site overnight.  Appointment at the multidisciplinary clinic has been scheduled on 11/04/2011 and bilateral breast MRI on 11/03/2011. All questions were answered.  Addended by:  Harrel Lemon, M.D. on 10/30/2011 16:38:27.  **END ADDENDUM** SIGNED BY: Harrel Lemon, M.D.   10/30/2011  *RADIOLOGY REPORT*  Clinical Data:  Palpable mass at 11 o'clock 3 cm from the right nipple.  ULTRASOUND GUIDED VACUUM ASSISTED CORE BIOPSY  OF THE RIGHT BREAST  The patient and I discussed the procedure of ultrasound-guided biopsy, including risks, benefits and alternatives.  Specifically, we discussed the risks of infection, bleeding, tissue injury, clip migration and inadequate sampling.  Informed written consent was given.  Using sterile technique, 2% lidocaine, ultrasound guidance and a 12 gauge vacuum assisted needle, biopsy was performed of the mass at 11 o'clock 3 cm from the right nipple.  At the conclusion of the procedure, a ribbon tissue marker clip was deployed into the biopsy cavity.  Follow-up 2-view mammogram was performed and dictated separately.  IMPRESSION: Ultrasound-guided biopsy of a mass at 11 o'clock 3 cm from the right nipple.  Pathologic results are pending.  No apparent complications.  Original Report Authenticated By: Harrel Lemon, M.D.   Mm Digital Diagnostic Bilat  10/28/2011  *RADIOLOGY REPORT*  Clinical Data:  The patient  has felt a lump in the right upper outer quadrant anteriorly for 1 week.  Her physician felt a lump in the left lower inner quadrant.  DIGITAL DIAGNOSTIC BILATERAL MAMMOGRAM WITH CAD AND BILATERAL BREAST ULTRASOUND:  Comparison:  08/08/2009, 08/23/2007  Findings:  There are scattered fibroglandular densities.  There is a spiculated mass with microcalcifications in the right anterior upper outer quadrant.  Mammographically this measures approximately 2 cm.  There are fine microcalcifications extending to and into the nipple from the mass.  No abnormality is noted on the left. Mammographic images were processed with CAD.  On physical exam, I palpate a 2 cm mass at 11 o'clock 3 cm from the right nipple. No mass is palpated in the left lower inner quadrant.  Ultrasound is performed, showing an irregularly marginated solid mass with posterior shadowing at 11 o'clock 3 cm from the right nipple measuring approximately 1.8 x 1.7 x 1.9 cm.  Sonography of the right axilla demonstrates no abnormal nodes. No abnormality is noted in the left lower inner quadrant.  Findings on the right are highly suspicious for invasive mammary carcinoma.  Biopsy is recommended.  Ultrasound-guided core needle biopsy was discussed with the patient and she agreed with this plan.  IMPRESSION: Highly suspicious mass and microcalcifications in the right anterior upper outer quadrant.  Ultrasound-guided core needle biopsy is recommended and will be performed and reported separately.  No mammographic or sonographic evidence of malignancy on the left.  BI-RADS CATEGORY 5:  Highly suggestive of malignancy - appropriate action should be taken.  Original Report Authenticated By: [name], M.D.   Mm Digital Diagnostic Unilat R  10/28/2011  *RADIOLOGY REPORT*  Clinical Data:  Suspicious palpable mass at 11 o'clock 3 cm from the right nipple.  DIGITAL DIAGNOSTIC RIGHT MAMMOGRAM  Comparison:  None.  Findings:  Films are performed following  ultrasound guided biopsy of a mass at 11 o'clock 3 cm from the right nipple.  The ribbon clip is appropriately positioned within the mass.  IMPRESSION: Appropriate clip placement following ultrasound-guided core needle biopsy of a mass at 11 o'clock, 3 cm from the right nipple.  Original Report Authenticated By: [name], M.D.    PATHOLOGY: Pathology from the 11:00 position 3 cm from the nipple on the right breast reveals an invasive mammary carcinoma with mammary carcinoma in situ. Estrogen receptor 100% progesterone receptor 3% proliferation marker 38% HER-2/neu negative.      ASSESSMENT    71 year old female with new diagnosis of what looks like a stage II or even a stage III invasive mammary carcinoma of the right breast with possibly  involvement of the nipple. By MRI the total tumor measured 7.5 cm. However it is unclear whether it whether it involves the nipple or not. Recommendation is to proceed with a biopsy of the nipple. Dr. [name] and Dr. Antony Blackbird  met and evaluated the patient as well    PLAN:    Plan at this time is first to proceed with the biopsy of the skin around the nipple an area LOC complex. If this is positive then the recommendation could possibly be either a lumpectomy or a mastectomy of front versus giving her as neoadjuvant chemotherapy. He did discuss today the rationale for neoadjuvant chemotherapy. She understands that it would be to shrink the tumor so that we could indeed do breast conservation. She has demonstrated understanding. She would like to proceed with the biopsy. We did try to attempt a biopsy here unfortunately there were not equipment problems. Therefore Dr. Derrell Lolling has opted to have the biopsy done in his office. Once the results of the biopsy are available then the final decision regarding what type of intervention should be made will be discussed with the patient by Dr. Derrell Lolling. If it is chosen that she should have neoadjuvant  chemotherapy then I will plan on seeing her sooner than December to discuss this with her. The patient and I discussed her family history she does have early-onset ovarian cancer history in the family as well as breast cancer. It was recommended that we certainly could get her to genetic testing and counseling. And she concurs and this referral will be made. I will plan on seeing the patient back as soon as possible but tentatively I have set her up to see me after Dr. Jacinto Halim visit.     All questions were answered. The patient knows to call the clinic with any problems, questions or concerns. We can certainly see the patient much sooner if necessary.  Thank you so much for allowing me to participate in the care of [name]. I will continue to follow up the patient with you and assist in her care.  I spent 60 minutes counseling the patient face to face. The total time spent in the appointment was 40 minutes. Drue Second, MD Medical/Oncology [facility] [phone] (beeper) [phone] (Office)  11/04/11

## 2011-11-09 NOTE — Telephone Encounter (Signed)
Pt called and given punch biopsy result. Pt advised that I have reviewed this with Dr Derrell Lolling and Dawn at [facility].  Pt states she wants to try to keep as much of her breast as possible. Pt advised that if she wants partial mastectomy, she will need to proceed with preop therapy under Dr Welton Flakes in hopes to shrink area. Pt understands this and wishes to proceed. I have called Dawn at [facility] and asked her to get pt in with DR Welton Flakes asap to set up pre op treatment plan. The pt is advised to keep appt on 11-26 with Dr Derrell Lolling to finalize plans and to check wound. Pt advised to let me know if she does not hear from [facility] by end of week.

## 2011-11-09 NOTE — Progress Notes (Signed)
Blood drawn for BRCA1/BRCA2 testing and sent to [facility]. TAT ~2 weeks. CSN: [id number]

## 2011-11-10 ENCOUNTER — Telehealth (INDEPENDENT_AMBULATORY_CARE_PROVIDER_SITE_OTHER): Payer: Self-pay

## 2011-11-10 ENCOUNTER — Telehealth: Payer: Self-pay | Admitting: *Deleted

## 2011-11-10 ENCOUNTER — Encounter: Payer: Self-pay | Admitting: *Deleted

## 2011-11-10 NOTE — Telephone Encounter (Signed)
Per [name] pt has been given appt with Dr [name] for 11-26 after office visit with Dr [name]. Seeing Dr [name] to discuss preop therapy.

## 2011-11-10 NOTE — Telephone Encounter (Signed)
Received message from Dr. Jacinto Halim nurse that pt would like to have BCT.  Informed Dr. Welton Flakes of pt wishes.  Dr. Welton Flakes will see pt on 11/26 @ 1000 to discuss neoadjuvant chemo.  Pt denies questions or needs from [facility] on 11/04/11.  Encourage pt to call with concerns.  Received verbal understanding.  Contact information given.

## 2011-11-11 ENCOUNTER — Telehealth: Payer: Self-pay | Admitting: Oncology

## 2011-11-11 NOTE — Telephone Encounter (Signed)
called and provided appt for 11/26

## 2011-11-16 ENCOUNTER — Other Ambulatory Visit: Payer: Medicare Other | Admitting: Lab

## 2011-11-16 ENCOUNTER — Encounter (INDEPENDENT_AMBULATORY_CARE_PROVIDER_SITE_OTHER): Payer: Self-pay | Admitting: General Surgery

## 2011-11-16 ENCOUNTER — Telehealth: Payer: Self-pay | Admitting: *Deleted

## 2011-11-16 ENCOUNTER — Ambulatory Visit (INDEPENDENT_AMBULATORY_CARE_PROVIDER_SITE_OTHER): Payer: Medicare Other | Admitting: General Surgery

## 2011-11-16 ENCOUNTER — Ambulatory Visit (HOSPITAL_BASED_OUTPATIENT_CLINIC_OR_DEPARTMENT_OTHER): Payer: Medicare Other | Admitting: Oncology

## 2011-11-16 VITALS — BP 133/70 | HR 94 | Temp 98.4°F | Ht 64.0 in | Wt 240.8 lb

## 2011-11-16 VITALS — BP 160/76 | HR 80 | Temp 97.3°F | Resp 18 | Ht 64.0 in | Wt 239.5 lb

## 2011-11-16 DIAGNOSIS — C50419 Malignant neoplasm of upper-outer quadrant of unspecified female breast: Secondary | ICD-10-CM

## 2011-11-16 DIAGNOSIS — C50919 Malignant neoplasm of unspecified site of unspecified female breast: Secondary | ICD-10-CM

## 2011-11-16 DIAGNOSIS — Z170 Estrogen receptor positive status [ER+]: Secondary | ICD-10-CM

## 2011-11-16 DIAGNOSIS — C50911 Malignant neoplasm of unspecified site of right female breast: Secondary | ICD-10-CM

## 2011-11-16 MED ORDER — DEXAMETHASONE 4 MG PO TABS: 4.0000 mg | ORAL_TABLET | Freq: Two times a day (BID) | ORAL | Status: AC

## 2011-11-16 MED ORDER — PROCHLORPERAZINE MALEATE 10 MG PO TABS: 10.0000 mg | ORAL_TABLET | Freq: Four times a day (QID) | ORAL | Status: DC | PRN

## 2011-11-16 MED ORDER — LIDOCAINE-PRILOCAINE 2.5-2.5 % EX CREA: TOPICAL_CREAM | CUTANEOUS | Status: DC | PRN

## 2011-11-16 MED ORDER — ONDANSETRON HCL 8 MG PO TABS: 8.0000 mg | ORAL_TABLET | Freq: Three times a day (TID) | ORAL | Status: AC | PRN

## 2011-11-16 NOTE — Progress Notes (Signed)
Subjective:     Patient ID: [name], female   DOB: [birth year], 70 y.o.   MRN: [id number]  HPI [name] returns to discuss her multifocal right breast cancer.  Punch biopsy of her right nipple was performed on November 06, 2011, and that is positive for invasive ductal carcinoma, receptor positive, HER-2/neu negative. We have discussed this with her by phone last week and again today in the office. It is her desire to pursue breast conservation if possible, and it is  her desire to have the least amount of tissue removed. Because of this we have arranged for office visit with me today and Dr. Drue Second today to discuss the role of neoadjuvant chemotherapy in her treatment.  We reviewed the fact that she has a biopsy-proven invasive ductal carcinoma in the upper outer quadrant of the right breast and also a subareolar cancer. These areas are probably just a few centimeters apart from each other and a generous central lobectomy can be considered. Theoretically less issue would be removed if the tumors are down staged by neoadjuvant chemotherapy. A decision will be made by Dr. Drue Second and the patient today.  The patient has not had any problems with wound healing from the nipple biopsy.  Review of Systems 12 system review of systems is performed and is negative except as described above.    Objective:   Physical Exam Gen.: The patient looks well and is in no distress. We had a very nice conversation. She has very good insight considering her medical background.  Breasts: the right breast is examined. It is large. The suture is removed from the nipple. There is no hematoma or infection. There is the area of palpable thickening just outside the areolar margin in the upper outer quadrant consistent with a biopsy-proven cancer.    Assessment:     Invasive ductal carcinoma right breast, multifocal but central, upper outer quadrant and subareolar involving the nipple. Receptor positive,  HER-2-negative  Patient desires breast conservation Genetic testing as been sent, but results are not known    Plan:     Tentatively, we will schedule her for Port-A-Cath insertion. I discussed the indiations and details of this procedure with her. Risks and complications have been outlined, including bleeding, infection, pneumothorax, catheter malfunction, and other unforseen problems. Her questions were answered. She agrees with this plan.  We discussed the genetic testing and I discussed the statistics, advising her that if she does have a positive gene mutation, she has an increased risk of developing a second cancer in the future. She is aware of this.  I told her that after neoadjuvant chemotherapy she would have an end of treatment MRI  I advised her that she once we completed her chemotherapy and restaged her we would proceed with a right partial mastectomy and sentinel node biopsy. This would be a generous central lumpectomy with removal of the nipple and areolar complex. I told her she would need postop radiation therapy, and most likely postop hormonal therapy.  She will discuss all these issues with Dr. Drue Second today and we will proceed with Port-A-Cath insertion unless there is a change of plans.  [name] M 11/16/2011 9:07 AM

## 2011-11-16 NOTE — Telephone Encounter (Signed)
RECEIVED A FAX FROM [facility] CONCERNING A PRIOR AUTHORIZATION FOR ONDANSETRON. THIS REQUEST WAS PLACED IN THE MANAGED CARE BIN. 

## 2011-11-16 NOTE — Telephone Encounter (Signed)
Gave patient appointments for 10-2011 thru 11-2011

## 2011-11-16 NOTE — Progress Notes (Signed)
OFFICE PROGRESS NOTE  CC Jasmine Eastern, MD [name], MD, MD [name] [facility] [place] [address]  DIAGNOSIS: 70 year old female with invasive ductal carcinoma of the right breast involving the right nipple diagnosed November 2012 appear  PRIOR THERAPY:   #1 status post ultrasound-guided core biopsy of the mass at the 11:00 position 3 cm from the right nipple. The pathology revealed invasive mammary carcinoma with carcinoma in situ grade 2 tumor was estrogen receptor +100% progesterone receptor +3% proliferation marker 38% HER-2/neu negative.  #2 MRI of the breast showed 2.5 x 2.4 x 1.8 cm spiculated enhancing mass in the anterior aspect of the upper outer quadrant of the right breast with smaller linear and nodular enhancing masses extending to the biopsied mass to the right nipple.  #3 patient is status post punch biopsy of the right nipple with the pathology revealing an invasive mammary carcinoma as well.  CURRENT THERAPY: Patient being considered for neoadjuvant chemotherapy.  INTERVAL HISTORY: [name] 70 y.o. female returns for followup visit today. She was seen by Dr. Daryl Gutierrez earlier. She is very much interested in getting breast conservation. She therefore is being considered for neoadjuvant chemotherapy. Clinically she seems to be doing well. She today has no complaints. She understands that neoadjuvant chemotherapy will reduce the size of the tumor allowing for breast conservation but it is not a guarantee. Notable is that her combined diameter of the mass and abnormal  enhancement was 7.5 cm. She has been seen by genetic counselor and has had genetic counseling as well as her blood drawn. We do not have her results as yet.  MEDICAL HISTORY: Past Medical History  Diagnosis Date  . Hyperthyroidism   . Asthma   . Sleep apnea     ALLERGIES:  is allergic to zocor.  MEDICATIONS:  Current Outpatient Prescriptions  Medication  Sig Dispense Refill  . albuterol (PROVENTIL) (5 MG/ML) 0.5% nebulizer solution Take by nebulization as needed.        Marland Kitchen aspirin 81 MG tablet Take 81 mg by mouth daily.        . cyclobenzaprine (FLEXERIL) 10 MG tablet Take 10 mg by mouth daily.        Marland Kitchen diltiazem (CARDIZEM CD) 240 MG 24 hr capsule Take 240 mg by mouth daily.        Marland Kitchen ezetimibe (ZETIA) 10 MG tablet Take 10 mg by mouth daily.        . Fluticasone-Salmeterol (ADVAIR) 100-50 MCG/DOSE AEPB Inhale 1 puff into the lungs every 12 (twelve) hours.        . hydrochlorothiazide (HYDRODIURIL) 25 MG tablet Take 25 mg by mouth daily.        . lansoprazole (PREVACID) 30 MG capsule Take 30 mg by mouth daily.        . meloxicam (MOBIC) 7.5 MG tablet Take 5 mg by mouth daily.        . metroNIDAZOLE (METROGEL) 0.75 % vaginal gel       . rosuvastatin (CRESTOR) 5 MG tablet Take 5 mg by mouth daily.          SURGICAL HISTORY:  Past Surgical History  Procedure Date  . Total abdominal hysterectomy   . Lumbar laminectomy   . Lumbar disc surgery   . Total knee arthroplasty   . Cardiac catheterization   . Punch biopsy 11/06/11    right breast/ in office        ECOG PERFORMANCE STATUS: 0 - Asymptomatic  Blood pressure 133/70, pulse 94, temperature 98.4 F (36.9 C), temperature source Oral, height 5\' 4"  (1.626 m), weight 240 lb 12.8 oz (109.226 kg).  LABORATORY DATA: Lab Results  Component Value Date   WBC 5.8 11/04/2011   HGB 13.6 11/04/2011   HCT 40.0 11/04/2011   MCV 88.8 11/04/2011   PLT 299 11/04/2011      Chemistry      Component Value Date/Time   NA 139 11/04/2011 1215   K 3.2* 11/04/2011 1215   CL 99 11/04/2011 1215   CO2 31 11/04/2011 1215   BUN 16 11/04/2011 1215   CREATININE 0.90 11/04/2011 1215      Component Value Date/Time   CALCIUM 10.2 11/04/2011 1215   ALKPHOS 83 11/04/2011 1215   AST 16 11/04/2011 1215   ALT 19 11/04/2011 1215   BILITOT 0.3 11/04/2011 1215       RADIOGRAPHIC STUDIES:  No results  found.  ASSESSMENT: 70 year old female with 7.5 cm mass in the right breast with positive skin biopsy at the nipple of the right breast. Patient is now being seen for neoadjuvant chemotherapy. I spent 50 minutes with the patient today discussing risks and benefits and rationale for neoadjuvant chemotherapy to she understands that she will receive chemotherapy all of it first. This will then be followed by her definitive surgery. Hopefully she can have a lumpectomy. However if that is not feasible then she will have to have a mastectomy. The type of surgery will eventually depend on the type of response we obtain with chemotherapy.   PLAN: I will plan on giving the patient Taxotere and Cytoxan combination chemotherapy. It will be given every 3 weeks with day 2 Neulasta. She will receive a total of 6 cycles. She will be seen by [place] almost every week for ongoing care. She and I discussed the side effects of treatment as well as the benefits. She demonstrated understanding. All questions were answered.  Patient will need a Port-A-Cath placed. This will be done in Dr. Marzella Schlein office. She will also need chemotherapy teaching class. I will get her set up for this.  My hope is to get her started on her chemotherapy in about 2 weeks' time. Today she will have a CBC CMET performed. I will plan on seeing her back in 2 weeks' time for followup as well as to begin her chemotherapy.  I will be prescribed her prescriptions for anti-emetics including Zofran and Compazine. We will give her EMLA cream for the port. She was given instructions on how to take her Decadron. Decadron will be prescribed as 8 mg twice a day starting on day before day of and day after chemotherapy. We also discussed use of tea tree oil to help with dry skin as well as nail changes.  Again I spent 45 minutes with the patient discussing all of the potential risks and benefits she demonstrated understanding. She knows to call me with any  problems.    All questions were answered. The patient knows to call the clinic with any problems, questions or concerns. We can certainly see the patient much sooner if necessary.  I spent 40 minutes counseling the patient face to face. The total time spent in the appointment was 55 minutes.    Drue Second, MD Medical/Oncology [facility] [phone] (beeper) [phone] (Office)  11/16/2011, 11:16 AM

## 2011-11-16 NOTE — Patient Instructions (Signed)
Our office will schedule you for Port-A-Cath insertion. This will be an outpatient surgery under anesthesia. You will discuss other aspects of your breast cancer staging and treatment with Dr. Drue Second today.

## 2011-11-17 ENCOUNTER — Other Ambulatory Visit: Payer: Self-pay | Admitting: *Deleted

## 2011-11-17 ENCOUNTER — Other Ambulatory Visit: Payer: Medicare Other

## 2011-11-17 ENCOUNTER — Encounter: Payer: Self-pay | Admitting: *Deleted

## 2011-11-17 NOTE — Progress Notes (Signed)
Pt. Here for chemo class.  Gave her script for decadron for 3 days and for a cranial prosthesis from Dr. Welton Flakes.

## 2011-11-18 ENCOUNTER — Telehealth: Payer: Self-pay | Admitting: Oncology

## 2011-11-18 ENCOUNTER — Telehealth: Payer: Self-pay | Admitting: *Deleted

## 2011-11-18 NOTE — Telephone Encounter (Signed)
Ondansetron 8mg  has been approved for one year until 11/16/12 per Victorino Dike, RN at [facility].  I called [facility] [phone] and spoke to Mia to let her know it was approved.

## 2011-11-19 ENCOUNTER — Encounter (HOSPITAL_COMMUNITY): Payer: Self-pay | Admitting: Pharmacy Technician

## 2011-11-20 ENCOUNTER — Telehealth: Payer: Self-pay | Admitting: Genetic Counselor

## 2011-11-23 ENCOUNTER — Encounter (HOSPITAL_BASED_OUTPATIENT_CLINIC_OR_DEPARTMENT_OTHER)
Admission: RE | Admit: 2011-11-23 | Discharge: 2011-11-23 | Disposition: A | Discharge status: Home / Self Care | Payer: Medicare Other | Source: Ambulatory Visit | Attending: General Surgery | Admitting: General Surgery

## 2011-11-23 ENCOUNTER — Encounter (HOSPITAL_BASED_OUTPATIENT_CLINIC_OR_DEPARTMENT_OTHER): Payer: Self-pay | Admitting: *Deleted

## 2011-11-23 ENCOUNTER — Ambulatory Visit
Admission: RE | Admit: 2011-11-23 | Discharge: 2011-11-23 | Disposition: A | Discharge status: Home / Self Care | Payer: Medicare Other | Source: Ambulatory Visit | Attending: General Surgery | Admitting: General Surgery

## 2011-11-23 LAB — BASIC METABOLIC PANEL
BUN: 18 mg/dL (ref 6–23)
Chloride: 100 mEq/L (ref 96–112)
GFR calc non Af Amer: 85 mL/min — ABNORMAL LOW (ref >90)
Glucose, Bld: 117 mg/dL — ABNORMAL HIGH (ref 70–99)
Potassium: 3.6 mEq/L (ref 3.5–5.1)

## 2011-11-23 NOTE — Pre-Procedure Instructions (Signed)
States is being treated for throat irritation/URI - will finish antibiotic 11/23/2011

## 2011-11-24 NOTE — Pre-Procedure Instructions (Signed)
Office note and EKG were req. from Dr. Mindi Junker Gutierrez's office 11/23/2011

## 2011-11-26 NOTE — Pre-Procedure Instructions (Signed)
EKG and office note received from Dr. Fredirick Maudlin office - EKG > 1 yr. old, will need EKG DOS

## 2011-11-26 NOTE — H&P (Signed)
[name]   11/04/2011 9:00 AM Office Visit  MRN: [id number]   Description: 70 year old female  Provider: Ernestene Mention, MD  Department: [facility]        Diagnoses     Breast cancer, right breast   - Primary    174.9           Progress Notes     Ernestene Mention, MD  11/04/2011  3:34 PM  Signed   HPI [name] is a 70 y.o. female.  She is a former [employer]. She is referred by Dr. Cain Saupe for evaluation of a new cancer in the right breast. Dr. Rodrigo Ran is her primary care physician. Dr. Chevis Pretty is her gynecologist. Caprice Kluver is her cardiologist. She is being seen in the breast multidisciplinary clinic by Dr. Roselind Messier and Dr. Welton Flakes and me.   The patient felt a mass in the right breast, upper outer quadrant fairly near to the nipple about 3 weeks ago. She has not had any breast problems in the past. She had mammograms and ultrasound which showed a mass in the upper outer quadrant of the right breast about 3 cm from the nipple. This is 1.9 cm by mammogram and ultrasound. Biopsy showed invasive ductal carcinoma, ER positive, PR week, Ki-67 15%, HER-2-negative. This is a clinical stage T2, N0 tumor.   MRI shows a 2.5 cm area of enhancement in the upper outer quadrant of the right breast. There is also a secondary area of enhancement which extends over to the subareolar and subnipple area which is thickened and felt to be highly suspicious. The maximum dimensions of from the outer edge of the primary tumor and the outer edge of the subareolar density is 7.5 cm. Her breasts are large.   We discussed her case in conference this morning and she is being seen in [facility] today.   Significant medical problems include coronary artery disease, status post cardiac cath x3. Obesity. Asthma. Sleep apnea. Hypertension.   Family history is positive for ovarian cancer in a cousin and a niece. Breast cancer in a cousin. Abdominal carcinomatosis in another  cousin. HPI    Past Medical History   Diagnosis  Date   .  Hyperthyroidism     .  Asthma     .  Sleep apnea         Past Surgical History   Procedure  Date   .  Total abdominal hysterectomy     .  Lumbar laminectomy     .  Lumbar disc surgery     .  Total knee arthroplasty     .  Cardiac catheterization        No family history on file.   Social History History   Substance Use Topics   .  Smoking status:  Never Smoker    .  Smokeless tobacco:  Not on file   .  Alcohol Use:  No       Allergies   Allergen  Reactions   .  Zocor (Simvastatin - High Dose)         Leg cramps       Current Outpatient Prescriptions   Medication  Sig  Dispense  Refill   .  albuterol (PROVENTIL) (5 MG/ML) 0.5% nebulizer solution  Take by nebulization as needed.           Marland Kitchen  aspirin 81 MG tablet  Take 81 mg by  mouth daily.           .  cyclobenzaprine (FLEXERIL) 10 MG tablet  Take 10 mg by mouth daily.           Marland Kitchen  diltiazem (CARDIZEM CD) 240 MG 24 hr capsule  Take 240 mg by mouth daily.           Marland Kitchen  ezetimibe (ZETIA) 10 MG tablet  Take 10 mg by mouth daily.           .  Fluticasone-Salmeterol (ADVAIR) 100-50 MCG/DOSE AEPB  Inhale 1 puff into the lungs every 12 (twelve) hours.           .  hydrochlorothiazide (HYDRODIURIL) 25 MG tablet  Take 25 mg by mouth daily.           .  lansoprazole (PREVACID) 30 MG capsule  Take 30 mg by mouth daily.           .  meloxicam (MOBIC) 7.5 MG tablet  Take 5 mg by mouth daily.           .  rosuvastatin (CRESTOR) 5 MG tablet  Take 5 mg by mouth daily.              Review of Systems  Constitutional: Negative for fever, chills and unexpected weight change.  HENT: Negative for hearing loss, congestion, sore throat, trouble swallowing and voice change.   Eyes: Negative for visual disturbance.  Respiratory: Positive for shortness of breath. Negative for cough and wheezing.   Cardiovascular: Positive for leg swelling. Negative for chest pain and palpitations.    Gastrointestinal: Negative for nausea, vomiting, abdominal pain, diarrhea, constipation, blood in stool, abdominal distention and anal bleeding.  Genitourinary: Negative for hematuria, vaginal bleeding and difficulty urinating.  Musculoskeletal: Negative for arthralgias.  Skin: Negative for rash and wound.  Neurological: Negative for seizures, syncope and headaches.  Hematological: Negative for adenopathy. Does not bruise/bleed easily.  Psychiatric/Behavioral: Negative for confusion.    There were no vitals taken for this visit.   Physical Exam  Constitutional: She is oriented to person, place, and time. She appears well-developed and well-nourished. No distress.       obese  HENT:   Head: Normocephalic and atraumatic.   Nose: Nose normal.   Mouth/Throat: No oropharyngeal exudate.  Eyes: Conjunctivae and EOM are normal. Pupils are equal, round, and reactive to light. Left eye exhibits no discharge. No scleral icterus.  Neck: Neck supple. No JVD present. No tracheal deviation present. No thyromegaly present.  Cardiovascular: Normal rate, regular rhythm, normal heart sounds and intact distal pulses.    No murmur heard. Pulmonary/Chest: Effort normal and breath sounds normal. No respiratory distress. She has no wheezes. She has no rales. She exhibits no tenderness.    Abdominal: Soft. Bowel sounds are normal. She exhibits no distension and no mass. There is no tenderness. There is no rebound and no guarding.  Musculoskeletal: She exhibits edema. She exhibits no tenderness.  Lymphadenopathy:    She has no cervical adenopathy.  Neurological: She is alert and oriented to person, place, and time. She exhibits normal muscle tone. Coordination normal.  Skin: Skin is warm. No rash noted. She is not diaphoretic. No erythema. No pallor.  Psychiatric: She has a normal mood and affect. Her behavior is normal. Judgment and thought content normal.    Data Reviewed I have reviewed her x-rays and  pathology slides. I have discussed her case and conference this morning. I have consulted with Dr.  Welton Flakes and Dr. Roselind Messier for decision making.   Assessment Invasive ductal carcinoma right breast, upper outer quadrant. ER positive. PR week. Ki-67 15%. HER-2 negative.   Second area of enhancement in subareolar area suggests multifocal disease.   Physical exam suggestsw nipple involvement. Further biopsy warranted.   Obesity   Sleep apnea   Asthma   Hypertension   Coronary disease.   Plan She will return to see me in my office in 2 days and we will do a punch biopsy of the nipple. We did not have any equipment in the breast clinic today.   After we obtain those results we will discuss options. Because her breasts are so large we could consider a generous central lumpectomy, although she would probably lose 25% of her breast that she would probably need a left breast reduction for symmetry at some point. Alternatively we could consider mastectomy with or without reconstruction following consultation with plastic surgery.   We will discuss options for surgical intervention at her next office visit and after the biopsy results are known.   I will discuss plans for her care with Dr. Welton Flakes and Dr. Roselind Messier prior to surgery.     Lissandro Dilorenzo M 11/04/2011, 3:21 PM               Patient Instructions     You have a 2.5 cm tumor in the right breast, upper outer quadrant. There is also a suspicious area of thickening behind your right nipple. You may still be a candidate for lumpectomy and sentinel lobe biopsy. The next step in your  care is to see me in the office in a few days and we will do a punch biopsy of the nipple to to determine the extent of your disease. We will discuss options for surgical intervention at that time.

## 2011-11-27 ENCOUNTER — Ambulatory Visit (HOSPITAL_BASED_OUTPATIENT_CLINIC_OR_DEPARTMENT_OTHER)
Admission: RE | Admit: 2011-11-27 | Discharge: 2011-11-27 | Disposition: A | Discharge status: Home / Self Care | Payer: Medicare Other | Source: Ambulatory Visit | Attending: General Surgery | Admitting: General Surgery

## 2011-11-27 ENCOUNTER — Other Ambulatory Visit: Payer: Self-pay

## 2011-11-27 ENCOUNTER — Encounter (HOSPITAL_BASED_OUTPATIENT_CLINIC_OR_DEPARTMENT_OTHER)
Admission: RE | Disposition: A | Discharge status: Home / Self Care | Payer: Self-pay | Source: Ambulatory Visit | Attending: General Surgery

## 2011-11-27 ENCOUNTER — Ambulatory Visit (HOSPITAL_COMMUNITY): Payer: Medicare Other

## 2011-11-27 ENCOUNTER — Ambulatory Visit (HOSPITAL_BASED_OUTPATIENT_CLINIC_OR_DEPARTMENT_OTHER): Payer: Medicare Other | Admitting: Anesthesiology

## 2011-11-27 ENCOUNTER — Encounter (HOSPITAL_BASED_OUTPATIENT_CLINIC_OR_DEPARTMENT_OTHER): Payer: Self-pay | Admitting: *Deleted

## 2011-11-27 ENCOUNTER — Encounter (HOSPITAL_BASED_OUTPATIENT_CLINIC_OR_DEPARTMENT_OTHER): Payer: Self-pay | Admitting: Anesthesiology

## 2011-11-27 DIAGNOSIS — I10 Essential (primary) hypertension: Secondary | ICD-10-CM | POA: Insufficient documentation

## 2011-11-27 DIAGNOSIS — G4730 Sleep apnea, unspecified: Secondary | ICD-10-CM | POA: Insufficient documentation

## 2011-11-27 DIAGNOSIS — K219 Gastro-esophageal reflux disease without esophagitis: Secondary | ICD-10-CM | POA: Insufficient documentation

## 2011-11-27 DIAGNOSIS — E119 Type 2 diabetes mellitus without complications: Secondary | ICD-10-CM | POA: Insufficient documentation

## 2011-11-27 DIAGNOSIS — Z01812 Encounter for preprocedural laboratory examination: Secondary | ICD-10-CM | POA: Insufficient documentation

## 2011-11-27 DIAGNOSIS — C50911 Malignant neoplasm of unspecified site of right female breast: Secondary | ICD-10-CM

## 2011-11-27 DIAGNOSIS — C50419 Malignant neoplasm of upper-outer quadrant of unspecified female breast: Secondary | ICD-10-CM | POA: Insufficient documentation

## 2011-11-27 DIAGNOSIS — C50919 Malignant neoplasm of unspecified site of unspecified female breast: Secondary | ICD-10-CM

## 2011-11-27 DIAGNOSIS — I2510 Atherosclerotic heart disease of native coronary artery without angina pectoris: Secondary | ICD-10-CM | POA: Insufficient documentation

## 2011-11-27 HISTORY — DX: Malignant neoplasm of unspecified site of unspecified female breast: C50.919

## 2011-11-27 HISTORY — DX: Spondylosis, unspecified: M47.9

## 2011-11-27 HISTORY — DX: Hyperlipidemia, unspecified: E78.5

## 2011-11-27 HISTORY — PX: PORTACATH PLACEMENT: SHX2246

## 2011-11-27 HISTORY — DX: Gastro-esophageal reflux disease without esophagitis: K21.9

## 2011-11-27 HISTORY — DX: Unspecified osteoarthritis, unspecified site: M19.90

## 2011-11-27 HISTORY — DX: Thyrotoxicosis with diffuse goiter without thyrotoxic crisis or storm: E05.00

## 2011-11-27 HISTORY — PX: LESION REMOVAL: SHX5196

## 2011-11-27 LAB — POCT HEMOGLOBIN-HEMACUE: Hemoglobin: 13.3 g/dL (ref 12.0–15.0)

## 2011-11-27 SURGERY — INSERTION, TUNNELED CENTRAL VENOUS DEVICE, WITH PORT
Anesthesia: General | Site: Face | Laterality: Right | Wound class: Clean

## 2011-11-27 MED ORDER — LACTATED RINGERS IV SOLN
INTRAVENOUS | Status: DC
  Administered 2011-11-27: 14:00:00 via INTRAVENOUS

## 2011-11-27 MED ORDER — EPHEDRINE SULFATE 50 MG/ML IJ SOLN
INTRAMUSCULAR | Status: DC | PRN
  Administered 2011-11-27: 15 mg via INTRAVENOUS

## 2011-11-27 MED ORDER — HEPARIN SOD (PORK) LOCK FLUSH 100 UNIT/ML IV SOLN
INTRAVENOUS | Status: DC | PRN
  Administered 2011-11-27: 500 [IU] via INTRAVENOUS

## 2011-11-27 MED ORDER — FENTANYL CITRATE 0.05 MG/ML IJ SOLN
INTRAMUSCULAR | Status: DC | PRN
  Administered 2011-11-27: 50 ug via INTRAVENOUS
  Administered 2011-11-27: 25 ug via INTRAVENOUS

## 2011-11-27 MED ORDER — HYDROCODONE-ACETAMINOPHEN 5-325 MG PO TABS: 1.0000 | ORAL_TABLET | ORAL | Status: AC | PRN

## 2011-11-27 MED ORDER — HEPARIN (PORCINE) IN NACL 2-0.9 UNIT/ML-% IJ SOLN
INTRAMUSCULAR | Status: DC | PRN
  Administered 2011-11-27: 1 via INTRAVENOUS

## 2011-11-27 MED ORDER — MIDAZOLAM HCL 2 MG/2ML IJ SOLN: 0.5000 mg | Freq: Once | INTRAMUSCULAR | Status: DC | PRN

## 2011-11-27 MED ORDER — PROMETHAZINE HCL 25 MG/ML IJ SOLN
6.2500 mg | INTRAMUSCULAR | Status: DC | PRN
  Administered 2011-11-27: 0.25 mg via INTRAVENOUS

## 2011-11-27 MED ORDER — PROPOFOL 10 MG/ML IV EMUL
INTRAVENOUS | Status: DC | PRN
  Administered 2011-11-27: 100 mg via INTRAVENOUS

## 2011-11-27 MED ORDER — MORPHINE SULFATE 2 MG/ML IJ SOLN: 0.0500 mg/kg | INTRAMUSCULAR | Status: DC | PRN

## 2011-11-27 MED ORDER — KETOROLAC TROMETHAMINE 30 MG/ML IJ SOLN
INTRAMUSCULAR | Status: DC | PRN
  Administered 2011-11-27: 30 mg via INTRAVENOUS

## 2011-11-27 MED ORDER — MEPERIDINE HCL 25 MG/ML IJ SOLN: 6.2500 mg | INTRAMUSCULAR | Status: DC | PRN

## 2011-11-27 MED ORDER — HYDROMORPHONE HCL PF 1 MG/ML IJ SOLN
0.2500 mg | INTRAMUSCULAR | Status: DC | PRN
  Administered 2011-11-27: 0.25 mg via INTRAVENOUS

## 2011-11-27 MED ORDER — ONDANSETRON HCL 4 MG/2ML IJ SOLN
INTRAMUSCULAR | Status: DC | PRN
  Administered 2011-11-27: 4 mg via INTRAVENOUS

## 2011-11-27 MED ORDER — BUPIVACAINE-EPINEPHRINE 0.25% -1:200000 IJ SOLN
INTRAMUSCULAR | Status: DC | PRN
  Administered 2011-11-27: 7 mL
  Administered 2011-11-27: 3 mL

## 2011-11-27 MED ORDER — CEFAZOLIN SODIUM-DEXTROSE 2-3 GM-% IV SOLR
2.0000 g | INTRAVENOUS | Status: AC
Start: 2011-11-27 — End: 2011-11-27
  Administered 2011-11-27: 2 g via INTRAVENOUS

## 2011-11-27 SURGICAL SUPPLY — 51 items
ADH SKN CLS APL DERMABOND .7 (GAUZE/BANDAGES/DRESSINGS) ×1
APL SKNCLS STERI-STRIP NONHPOA (GAUZE/BANDAGES/DRESSINGS) ×2
BAG DECANTER FOR FLEXI CONT (MISCELLANEOUS) ×3 IMPLANT
BENZOIN TINCTURE PRP APPL 2/3 (GAUZE/BANDAGES/DRESSINGS) ×3 IMPLANT
BLADE HEX COATED 2.75 (ELECTRODE) ×3 IMPLANT
BLADE SURG 15 STRL LF DISP TIS (BLADE) ×4 IMPLANT
BLADE SURG 15 STRL SS (BLADE) ×6
CANISTER SUCTION 1200CC (MISCELLANEOUS) IMPLANT
CHLORAPREP W/TINT 26ML (MISCELLANEOUS) ×3 IMPLANT
CLOTH BEACON ORANGE TIMEOUT ST (SAFETY) ×3 IMPLANT
COVER KIT LFREE 14X147CM (MISCELLANEOUS) ×3 IMPLANT
COVER MAYO STAND STRL (DRAPES) ×3 IMPLANT
COVER TABLE BACK 60X90 (DRAPES) ×3 IMPLANT
DECANTER SPIKE VIAL GLASS SM (MISCELLANEOUS) ×3 IMPLANT
DERMABOND ADVANCED (GAUZE/BANDAGES/DRESSINGS) ×1
DERMABOND ADVANCED .7 DNX12 (GAUZE/BANDAGES/DRESSINGS) ×2 IMPLANT
DRAPE C-ARM 42X72 X-RAY (DRAPES) ×3 IMPLANT
DRAPE LAPAROSCOPIC ABDOMINAL (DRAPES) ×3 IMPLANT
DRAPE UTILITY XL STRL (DRAPES) ×3 IMPLANT
DRSG TEGADERM 4X10 (GAUZE/BANDAGES/DRESSINGS) ×3 IMPLANT
ELECT REM PT RETURN 9FT ADLT (ELECTROSURGICAL) ×3
ELECTRODE REM PT RTRN 9FT ADLT (ELECTROSURGICAL) ×2 IMPLANT
GAUZE SPONGE 4X4 12PLY STRL LF (GAUZE/BANDAGES/DRESSINGS) ×3 IMPLANT
GLOVE BIO SURGEON STRL SZ7 (GLOVE) ×3 IMPLANT
GLOVE EUDERMIC 7 POWDERFREE (GLOVE) ×3 IMPLANT
GOWN PREVENTION PLUS XLARGE (GOWN DISPOSABLE) IMPLANT
GOWN PREVENTION PLUS XXLARGE (GOWN DISPOSABLE) ×6 IMPLANT
IV CATH PLACEMENT UNIT 16 GA (IV SOLUTION) IMPLANT
IV HEPARIN 1000UNITS/500ML (IV SOLUTION) ×3 IMPLANT
IV KIT MINILOC 20X1 SAFETY (NEEDLE) IMPLANT
KIT BARDPORT ISP (Port) IMPLANT
KIT POWER CATH 8FR (Catheter) ×3 IMPLANT
NEEDLE BLUNT 17GA (NEEDLE) IMPLANT
NEEDLE HYPO 22GX1.5 SAFETY (NEEDLE) ×3 IMPLANT
NEEDLE HYPO 25X1 1.5 SAFETY (NEEDLE) ×3 IMPLANT
PACK BASIN DAY SURGERY FS (CUSTOM PROCEDURE TRAY) ×3 IMPLANT
PENCIL BUTTON HOLSTER BLD 10FT (ELECTRODE) ×3 IMPLANT
SET SHEATH INTRODUCER 10FR (MISCELLANEOUS) IMPLANT
SHEATH COOK PEEL AWAY SET 9F (SHEATH) IMPLANT
SLEEVE SCD COMPRESS KNEE MED (MISCELLANEOUS) ×3 IMPLANT
STRIP CLOSURE SKIN 1/2X4 (GAUZE/BANDAGES/DRESSINGS) ×3 IMPLANT
SUT MNCRL AB 4-0 PS2 18 (SUTURE) ×3 IMPLANT
SUT PROLENE 2 0 CT2 30 (SUTURE) ×3 IMPLANT
SUT VICRYL 3-0 CR8 SH (SUTURE) ×3 IMPLANT
SYR 5ML LUER SLIP (SYRINGE) ×3 IMPLANT
SYR CONTROL 10ML LL (SYRINGE) ×3 IMPLANT
TOWEL OR 17X24 6PK STRL BLUE (TOWEL DISPOSABLE) ×6 IMPLANT
TOWEL OR NON WOVEN STRL DISP B (DISPOSABLE) ×3 IMPLANT
TUBE CONNECTING 20X1/4 (TUBING) IMPLANT
WATER STERILE IRR 1000ML POUR (IV SOLUTION) ×3 IMPLANT
YANKAUER SUCT BULB TIP NO VENT (SUCTIONS) IMPLANT

## 2011-11-27 NOTE — Interval H&P Note (Signed)
History and Physical Interval Note:  11/27/2011 2:53 PM  [name]  has presented today for surgery, with the diagnosis of breast cancer  The goals of treatment and the  various methods of treatment have been discussed with the patient and family. After consideration of risks, benefits and other options for treatment, the patient has consented to  Procedure(s): INSERTION PORT-A-CATH as a surgical intervention .  The patients' history has been reviewed today, the  patient examined today , there is no change in status, she is stable for surgery.  I have reviewed the patients' chart and labs.  Questions were answered to the patient's satisfaction.     Ernestene Mention 11/27/2011 2:54 PM

## 2011-11-27 NOTE — Interval H&P Note (Signed)
History and Physical Interval Note:  11/27/2011 3:04 PM  [name]  has presented today for surgery, with the diagnosis of breast cancer  The various methods of treatment have been discussed with the patient and family. After consideration of risks, benefits and other options for treatment, the patient has consented to  Procedure(s): INSERTION PORT-A-CATH as a surgical intervention .  The patients' history has been reviewed, patient examined, no change in status, stable for surgery.  I have reviewed the patients' chart and labs.  Questions were answered to the patient's satisfaction.    Today, the patient requests that we also remove a skin tag from her right face. I see a small skin tag on the lateral inferior orbital rim. I discussed this with the patient and she also wants this done. We discussed the goals of treatment and its risks and she is comfortable with all of this. All of her questions are answered. Ernestene Mention 11/27/2011 3:05 PM

## 2011-11-27 NOTE — Anesthesia Procedure Notes (Signed)
Procedure Name: LMA Insertion Performed by: Courtney Fenlon Pre-anesthesia Checklist: Patient identified, Timeout performed, Emergency Drugs available, Suction available and Patient being monitored Patient Re-evaluated:Patient Re-evaluated prior to inductionOxygen Delivery Method: Circle System Utilized Preoxygenation: Pre-oxygenation with 100% oxygen Intubation Type: IV induction Ventilation: Mask ventilation without difficulty LMA: LMA inserted and LMA with gastric port inserted LMA Size: 4.0 Number of attempts: 1 Placement Confirmation: breath sounds checked- equal and bilateral and positive ETCO2 Tube secured with: Tape Dental Injury: Teeth and Oropharynx as per pre-operative assessment      

## 2011-11-27 NOTE — Anesthesia Preprocedure Evaluation (Addendum)
Anesthesia Evaluation  Patient identified by MRN, date of birth, ID band Patient awake    Reviewed: Allergy & Precautions, H&P , NPO status , Patient's Chart, lab work & pertinent test results  Airway Mallampati: II      Dental  (+) Dental Advisory Given   Pulmonary asthma , sleep apnea ,  clear to auscultation        Cardiovascular hypertension, Pt. on medications + CAD Regular Normal    Neuro/Psych Negative Neurological ROS  Negative Psych ROS   GI/Hepatic GERD-  ,  Endo/Other  Diabetes mellitus-Hyperthyroidism   Renal/GU      Musculoskeletal negative musculoskeletal ROS (+)   Abdominal   Peds  Hematology negative hematology ROS (+)   Anesthesia Other Findings   Reproductive/Obstetrics                           Anesthesia Physical Anesthesia Plan  ASA: III  Anesthesia Plan: General   Post-op Pain Management:    Induction: Intravenous  Airway Management Planned: Mask  Additional Equipment:   Intra-op Plan:   Post-operative Plan:   Informed Consent: I have reviewed the patients History and Physical, chart, labs and discussed the procedure including the risks, benefits and alternatives for the proposed anesthesia with the patient or authorized representative who has indicated his/her understanding and acceptance.   Dental advisory given  Plan Discussed with: CRNA  Anesthesia Plan Comments:        Anesthesia Quick Evaluation

## 2011-11-27 NOTE — Transfer of Care (Signed)
Immediate Anesthesia Transfer of Care Note  Patient: [name]  Procedure(s) Performed:  INSERTION PORT-A-CATH; LESION REMOVAL - Skin tag removed from under right eye. No specimen  Patient Location: PACU  Anesthesia Type: General  Level of Consciousness: awake, alert  and oriented  Airway & Oxygen Therapy: Patient Spontanous Breathing and Patient connected to face mask oxygen  Post-op Assessment: Report given to PACU RN and Post -op Vital signs reviewed and stable  Post vital signs: Reviewed and stable  Complications: No apparent anesthesia complications

## 2011-11-27 NOTE — Op Note (Signed)
Patient Name:           [name]   Date of Surgery:        11/27/2011  Pre op Diagnosis:      Invasive ductal carcinoma right breast, multifocal, ER-positive, HER-2-negative, clinical stage T2,N0 Ki-67 15%.  Post op Diagnosis:      same  Procedure:                 Insertion of 8 French power port venous vascular access device, use of fluoroscopy for guidance and positioning of catheter.  Surgeon:                     Angelia Mould. Derrell Lolling, M.D., FACS  Assistant:                      Operative Indications:   This is a 70 year old African  American female who recently felt a mass in her right breast in the upper outer quadrant just outside the areolar margin. Imaging studies showed a 1.9 cm mass in the upper-outer quadrant right breast about 3 cm from the nipple. This area was biopsied and showed invasive ductal carcinoma with breast diagnostic profile as described above. MRI showed a 2.5 cm. area of enhancement in the upper outer quadrant of the right breast and also showed a  secondary area of enhancement extending into the subareolar and nipple area. I biopsied the skin of the nipple in the office and that was positive for cancer. She is felt to have multifocal disease but she desires breast conservation if possible. The decision was made to give her neoadjuvant chemotherapy and she is brought to the operating room for insertion of Port-A-Cath.  Operative Findings:         Procedure in Detail:          Following the induction of general endotracheal anesthesia the patient was positioned with her arms at her side and a roll between her shoulders. The neck and chest was prepped and draped in a sterile fashion. Intravenous antibiotics were given and a surgical time out was held. 0.5% Marcaine with epinephrine was used as a local infiltration anesthetic. A right subclavian venipuncture was performed with good blood return, a guidewire was inserted into the superior vena cava and fluoroscopic guidance was  used to confirm positioning of the catheter. A small skin incision was made at this site. A transverse incision was made about 2-1/2 cm below the medial aspect of the clavicle. The patient's subcutaneous tissue breast tissue was approximately 2.5 cm. thick.   . Dissection was carried down to the pectoralis fascia. I did review some of the subcutaneous fat. The catheter was drawn between the wire insertion site and the port pocket site using a tunneling device. I placed the catheter on the chest wall. I used fluoroscopy to mark a template where  the catheter should lie  with the tip should be in the superior vena cava just above the right atrium. I then positioned the catheter and cut it an appropriate length. The catheter was cut 25 cm in length. The catheter was secured to the port with the locking device and flushed with heparinized saline. The port was sutured to the pectoralis fascia with 3 interrupted sutures of 2-0 Prolene. With the patient back in Trendelenburg position I inserted the dilator and peel-away sheath over the guidewire into the central venous circulation. A dilator and wire were removed. The catheter was easily threaded  into the peel-away sheath the peel-away sheath removed. The catheter flushed easily and had excellent blood return. Fluoroscopy confirmed the catheter was positioned well without any deformity and that the tip was in the superior vena cava just above the right atrium. The catheter was flushed with heparinized saline. Subcutaneous tissue was closed with 3-0 Vicryl sutures and the skin incisions were closed with subcuticular sutures of 4-0 Monocryl and Steri-Strips. The patient was taken to recovery in stable condition. A chest x-ray is planned for the recovery room. EBL 15 cc. Counts correct. Complications none.     Angelia Mould. Derrell Lolling, M.D., FACS General and Minimally Invasive Surgery Breast and Colorectal Surgery  11/27/2011 3:55 PM

## 2011-11-27 NOTE — Anesthesia Postprocedure Evaluation (Signed)
  Anesthesia Post-op Note  Patient: [name]  Procedure(s) Performed:  INSERTION PORT-A-CATH; LESION REMOVAL - Skin tag removed from under right eye. No specimen  Patient Location: PACU  Anesthesia Type: General  Level of Consciousness: awake, alert  and oriented  Airway and Oxygen Therapy: Patient Spontanous Breathing  Post-op Pain: none  Post-op Assessment: Post-op Vital signs reviewed, Patient's Cardiovascular Status Stable, Respiratory Function Stable, Patent Airway, No signs of Nausea or vomiting and Pain level controlled  Post-op Vital Signs: Reviewed and stable  Complications: No apparent anesthesia complications

## 2011-11-30 ENCOUNTER — Telehealth (INDEPENDENT_AMBULATORY_CARE_PROVIDER_SITE_OTHER): Payer: Self-pay | Admitting: General Surgery

## 2011-12-01 ENCOUNTER — Encounter (HOSPITAL_BASED_OUTPATIENT_CLINIC_OR_DEPARTMENT_OTHER): Payer: Self-pay | Admitting: General Surgery

## 2011-12-02 ENCOUNTER — Ambulatory Visit (HOSPITAL_BASED_OUTPATIENT_CLINIC_OR_DEPARTMENT_OTHER): Payer: Medicare Other

## 2011-12-02 ENCOUNTER — Ambulatory Visit (HOSPITAL_BASED_OUTPATIENT_CLINIC_OR_DEPARTMENT_OTHER): Payer: Medicare Other | Admitting: Oncology

## 2011-12-02 ENCOUNTER — Telehealth: Payer: Self-pay | Admitting: *Deleted

## 2011-12-02 ENCOUNTER — Other Ambulatory Visit (HOSPITAL_BASED_OUTPATIENT_CLINIC_OR_DEPARTMENT_OTHER): Payer: Medicare Other

## 2011-12-02 ENCOUNTER — Other Ambulatory Visit: Payer: Self-pay | Admitting: *Deleted

## 2011-12-02 VITALS — BP 149/71 | HR 88 | Temp 98.4°F | Ht 64.5 in | Wt 238.3 lb

## 2011-12-02 VITALS — BP 118/66 | HR 67 | Temp 98.6°F

## 2011-12-02 DIAGNOSIS — C50019 Malignant neoplasm of nipple and areola, unspecified female breast: Secondary | ICD-10-CM

## 2011-12-02 DIAGNOSIS — C50419 Malignant neoplasm of upper-outer quadrant of unspecified female breast: Secondary | ICD-10-CM

## 2011-12-02 DIAGNOSIS — Z170 Estrogen receptor positive status [ER+]: Secondary | ICD-10-CM

## 2011-12-02 DIAGNOSIS — C50911 Malignant neoplasm of unspecified site of right female breast: Secondary | ICD-10-CM

## 2011-12-02 DIAGNOSIS — Z5111 Encounter for antineoplastic chemotherapy: Secondary | ICD-10-CM

## 2011-12-02 DIAGNOSIS — C50919 Malignant neoplasm of unspecified site of unspecified female breast: Secondary | ICD-10-CM

## 2011-12-02 DIAGNOSIS — Z09 Encounter for follow-up examination after completed treatment for conditions other than malignant neoplasm: Secondary | ICD-10-CM

## 2011-12-02 LAB — COMPREHENSIVE METABOLIC PANEL
ALT: 15 U/L (ref 0–35)
Albumin: 4.3 g/dL (ref 3.5–5.2)
CO2: 25 mEq/L (ref 19–32)
Calcium: 9.7 mg/dL (ref 8.4–10.5)
Chloride: 100 mEq/L (ref 96–112)
Creatinine, Ser: 0.81 mg/dL (ref 0.50–1.10)
Potassium: 4.1 mEq/L (ref 3.5–5.3)
Total Protein: 7.5 g/dL (ref 6.0–8.3)

## 2011-12-02 LAB — CBC WITH DIFFERENTIAL/PLATELET
Eosinophils Absolute: 0 10*3/uL (ref 0.0–0.5)
HCT: 38.8 % (ref 34.8–46.6)
LYMPH%: 8.7 % — ABNORMAL LOW (ref 14.0–49.7)
MONO#: 0.3 10*3/uL (ref 0.1–0.9)
NEUT#: 12.9 10*3/uL — ABNORMAL HIGH (ref 1.5–6.5)
NEUT%: 89.1 % — ABNORMAL HIGH (ref 38.4–76.8)
Platelets: 337 10*3/uL (ref 145–400)
RBC: 4.53 10*6/uL (ref 3.70–5.45)
WBC: 14.4 10*3/uL — ABNORMAL HIGH (ref 3.9–10.3)
lymph#: 1.3 10*3/uL (ref 0.9–3.3)
nRBC: 0 % (ref 0–0)

## 2011-12-02 MED ORDER — SODIUM CHLORIDE 0.9 % IV SOLN
Freq: Once | INTRAVENOUS | Status: AC
  Administered 2011-12-02: 13:00:00 via INTRAVENOUS

## 2011-12-02 MED ORDER — DEXAMETHASONE SODIUM PHOSPHATE 4 MG/ML IJ SOLN
20.0000 mg | Freq: Once | INTRAMUSCULAR | Status: AC
  Administered 2011-12-02: 20 mg via INTRAVENOUS

## 2011-12-02 MED ORDER — HEPARIN SOD (PORK) LOCK FLUSH 100 UNIT/ML IV SOLN
500.0000 [IU] | Freq: Once | INTRAVENOUS | Status: AC | PRN
  Administered 2011-12-02: 500 [IU]
  Filled 2011-12-02: qty 5

## 2011-12-02 MED ORDER — SODIUM CHLORIDE 0.9 % IV SOLN
600.0000 mg/m2 | Freq: Once | INTRAVENOUS | Status: AC
  Administered 2011-12-02: 1340 mg via INTRAVENOUS
  Filled 2011-12-02: qty 67

## 2011-12-02 MED ORDER — SODIUM CHLORIDE 0.9 % IJ SOLN
10.0000 mL | INTRAMUSCULAR | Status: DC | PRN
  Administered 2011-12-02: 10 mL
  Filled 2011-12-02: qty 10

## 2011-12-02 MED ORDER — DOCETAXEL CHEMO INJECTION 160 MG/16ML
75.0000 mg/m2 | Freq: Once | INTRAVENOUS | Status: AC
  Administered 2011-12-02: 170 mg via INTRAVENOUS
  Filled 2011-12-02: qty 17

## 2011-12-02 MED ORDER — ONDANSETRON 16 MG/50ML IVPB (CHCC)
16.0000 mg | Freq: Once | INTRAVENOUS | Status: AC
  Administered 2011-12-02: 16 mg via INTRAVENOUS

## 2011-12-02 NOTE — Patient Instructions (Signed)
Patient discharged home with no complaints; tolerated treatment well; patient has Rxs home for nausea and pain.

## 2011-12-02 NOTE — Telephone Encounter (Signed)
gave patient appointment for 11-2011 thru 02-2012 with chemo every three weeks starting in 12-23-2011 printed out calendar and gave to the patient in the chemo room

## 2011-12-02 NOTE — Telephone Encounter (Signed)
Na

## 2011-12-02 NOTE — Progress Notes (Signed)
OFFICE PROGRESS NOTE  CC Jasmine Eastern, MD Jasmine Kayser, MD, MD [name] [facility] [name] [address]  DIAGNOSIS: 70 year old female with invasive ductal carcinoma of the right breast involving the right nipple diagnosed November 2012 appear  PRIOR THERAPY:   #1 status post ultrasound-guided core biopsy of the mass at the 11:00 position 3 cm from the right nipple. The pathology revealed invasive mammary carcinoma with carcinoma in situ grade 2 tumor was estrogen receptor +100% progesterone receptor +3% proliferation marker 38% HER-2/neu negative.  #2 MRI of the breast showed 2.5 x 2.4 x 1.8 cm spiculated enhancing mass in the anterior aspect of the upper outer quadrant of the right breast with smaller linear and nodular enhancing masses extending to the biopsied mass to the right nipple.  #3 patient is status post punch biopsy of the right nipple with the pathology revealing an invasive mammary carcinoma as well.  CURRENT THERAPY: Patient here for cycle 1 day 1 of Taxotere and Cytoxan being given neoadjuvantly she will return tomorrow for day 2 Neulasta  INTERVAL HISTORY: [name] 70 y.o. female for followup visit today to begin her neoadjuvant chemotherapy consisting of Taxotere and Cytoxan. Overall she seems to be doing well and is without any complaints. She had her Port-A-Cath placed by Dr. [name]. It is on the right side. She overall feels well she does have her and look cream on. She is denying any nausea vomiting fevers chills night sweats headaches shortness of breath chest pains palpitations. She is appropriately premedicated with the dexamethasone as I have prescribed for her prior to infusion of the Taxotere. She currently denies any myalgias arthralgias or peripheral paresthesias.[name] of the 10 point review of systems is negative. MEDICAL HISTORY: Past Medical History  Diagnosis Date  . GERD (gastroesophageal reflux disease)    . Asthma     daily and prn inhalers  . Sleep apnea     uses CPAP occ. - to bring machine DOS  . Grave's disease     no current meds.  . Arthritis     osteo - s/p right total knee  . Spondylosis     cervical and lumbar  . Hyperlipidemia   . Breast cancer   . Diabetes mellitus     diet-controlled    ALLERGIES:  is allergic to zocor.  MEDICATIONS:  Current Outpatient Prescriptions  Medication Sig Dispense Refill  . albuterol (PROVENTIL HFA;VENTOLIN HFA) 108 (90 BASE) MCG/ACT inhaler Inhale 2 puffs into the lungs as needed.        Marland Kitchen aspirin 81 MG tablet Take 81 mg by mouth daily.       . cyclobenzaprine (FLEXERIL) 10 MG tablet Take 5 mg by mouth 3 (three) times daily as needed. For muscle spasms      . dexamethasone (DECADRON) 4 MG tablet Take 1 tablet (4 mg total) by mouth 2 (two) times daily. Take 2 pills po BID day before, day of, and day after chemotherapy then stop. Repeat as directed by MD each cycle of chemothrapy  40 tablet  4  . diltiazem (CARDIZEM CD) 240 MG 24 hr capsule Take 240 mg by mouth daily. AM      . ezetimibe (ZETIA) 10 MG tablet Take 10 mg by mouth daily. AM      . Fluticasone-Salmeterol (ADVAIR) 100-50 MCG/DOSE AEPB Inhale 1 puff into the lungs every 12 (twelve) hours.       Marland Kitchen HYDROcodone-acetaminophen (NORCO) 5-325 MG per tablet Take  1-2 tablets by mouth every 4 (four) hours as needed for pain.  40 tablet  1  . lansoprazole (PREVACID) 30 MG capsule Take 30 mg by mouth 2 (two) times daily.       Marland Kitchen lidocaine-prilocaine (EMLA) cream Apply topically as needed.  30 g  4  . meloxicam (MOBIC) 7.5 MG tablet Take 5 mg by mouth daily as needed. For knee tightness      . ondansetron (ZOFRAN) 8 MG tablet Take by mouth every 8 (eight) hours as needed.        . prochlorperazine (COMPAZINE) 10 MG tablet Take 10 mg by mouth every 6 (six) hours as needed.        . triamterene-hydrochlorothiazide (MAXZIDE-25) 37.5-25 MG per tablet Take 1 tablet by mouth daily. AM      .  rosuvastatin (CRESTOR) 5 MG tablet Take 5 mg by mouth daily. AM        SURGICAL HISTORY:  Past Surgical History  Procedure Date  . Lumbar laminectomy     back surg. x 2 - 1980's and 1999  . Total knee arthroplasty 07/28/2006    right  . Cataract extraction, bilateral   . Bilateral oophorectomy 2009  . Partial hysterectomy   . Cardiac catheterization 04/10/2010, 05/07/2004    LAD lesion, blood flow OK, per pt.  . Portacath placement 11/27/2011    Procedure: INSERTION PORT-A-CATH;  Surgeon: Ernestene Mention, MD;  Location: [facility];  Service: General;  Laterality: Right;  . Lesion removal 11/27/2011    Procedure: LESION REMOVAL;  Surgeon: Ernestene Mention, MD;  Location:  SURGERY CENTER;  Service: General;  Laterality: Right;  Skin tag removed from under right eye. No specimen        ECOG PERFORMANCE STATUS: 0 - Asymptomatic  Blood pressure 149/71, pulse 88, temperature 98.4 F (36.9 C), temperature source Oral, height 5' 4.5" (1.638 m), weight 238 lb 4.8 oz (108.092 kg).  HEENT exam: EOMI PERRLA sclerae anicteric no conjunctival pallor oral mucosa is moist neck is supple   LUNGS: are clear bilaterally to auscultation and percussion  CVS: is regular rate rhythm no murmurs gallops or rubs  ABDOMEN: is soft nontender nondistended bowel sounds are present no HSM  EXTREMITITES: no clubbing edema or cyanosis  BREAST EXAM:left breast is without any masses nipple discharge right breast does reveal him palpable mass with slight nipple retraction. No other skin changes   NEURO: patient's alert oriented x3 DTRs are +4 motor and sensory is intact strength is symmetrical in upper and lower extremities.  LABORATORY DATA: Lab Results  Component Value Date   WBC 14.4* 12/02/2011   HGB 13.4 12/02/2011   HCT 38.8 12/02/2011   MCV 85.7 12/02/2011   PLT 337 12/02/2011      Chemistry      Component Value Date/Time   NA 141 11/23/2011 1120   K 3.6 11/23/2011 1120    CL 100 11/23/2011 1120   CO2 30 11/23/2011 1120   BUN 18 11/23/2011 1120   CREATININE 0.74 11/23/2011 1120      Component Value Date/Time   CALCIUM 10.1 11/23/2011 1120   ALKPHOS 83 11/04/2011 1215   AST 16 11/04/2011 1215   ALT 19 11/04/2011 1215   BILITOT 0.3 11/04/2011 1215       RADIOGRAPHIC STUDIES:  No results found.  ASSESSMENT: 70 year old female with 7.5 cm mass in the right breast with positive skin biopsy at the nipple of the right breast. Patient is  now being seen for neoadjuvant chemotherapy. Patient today will begin her cycle 1 day 1 of neoadjuvant chemotherapy consisting of Taxotere and Cytoxan given every 21 days. Risks and benefits of treatment are discussed with the patient in great detail. She has had her Port-A-Cath in place chemotherapy teaching class has been done and she has all of her anti-emetics in place.    PLAN: Patient will help patient will proceed with her scheduled chemotherapy today. She will return in one week's time for followup in the interim lapse. She knows to call with any problems questions or concerns.  All questions were answered. The patient knows to call the clinic with any problems, questions or concerns. We can certainly see the patient much sooner if necessary.  I spent 20 minutes counseling the patient face to face. The total time spent in the appointment was 30 minutes.    Drue Second, MD Medical/Oncology [facility] [phone] (beeper) [phone] (Office)  12/02/2011, 12:35 PM

## 2011-12-02 NOTE — Telephone Encounter (Signed)
gave patient appointment for 11-2011 thru 02-2012 with chemo every three weeks starting in 12-23-2011 printed out calendar and gave to the patient in the chemo room 

## 2011-12-03 ENCOUNTER — Other Ambulatory Visit: Payer: Self-pay | Admitting: Oncology

## 2011-12-03 ENCOUNTER — Telehealth: Payer: Self-pay | Admitting: *Deleted

## 2011-12-03 ENCOUNTER — Ambulatory Visit (HOSPITAL_BASED_OUTPATIENT_CLINIC_OR_DEPARTMENT_OTHER): Payer: Medicare Other

## 2011-12-03 VITALS — BP 144/77 | HR 84 | Temp 98.3°F

## 2011-12-03 DIAGNOSIS — Z5189 Encounter for other specified aftercare: Secondary | ICD-10-CM

## 2011-12-03 DIAGNOSIS — C50419 Malignant neoplasm of upper-outer quadrant of unspecified female breast: Secondary | ICD-10-CM

## 2011-12-03 DIAGNOSIS — C50911 Malignant neoplasm of unspecified site of right female breast: Secondary | ICD-10-CM

## 2011-12-03 MED ORDER — PEGFILGRASTIM INJECTION 6 MG/0.6ML
6.0000 mg | Freq: Once | SUBCUTANEOUS | Status: AC
  Administered 2011-12-03: 6 mg via SUBCUTANEOUS
  Filled 2011-12-03: qty 0.6

## 2011-12-03 NOTE — Telephone Encounter (Signed)
Message copied by Augusto Garbe on Thu Dec 03, 2011  2:22 PM ------      Message from: Kallie Locks      Created: Wed Dec 02, 2011  4:11 PM      Regarding: Chemo Follow-Up Call       Patient received Taxotere and Cytoxan today (12/02/11)..per Dr. Welton Flakes.

## 2011-12-03 NOTE — Telephone Encounter (Signed)
Patient is doing well.  No problems, side effects or questions at this time.  Has taken anti-emetics as instructed by MD.  Has drank six out of eight glasses of water today.  Hopes all treatments go as well as the first.  Reviewed how to call after hours if needed.

## 2011-12-04 ENCOUNTER — Telehealth: Payer: Self-pay | Admitting: *Deleted

## 2011-12-04 NOTE — Telephone Encounter (Signed)
DISCUSSED WITH PT. FOODS WHICH ARE A GOOD SOURCE OF PROTEIN, GOOD HAND WASHING, AND AVOIDING CROWDS. SHE VOICES UNDERSTANDING.

## 2011-12-10 ENCOUNTER — Telehealth: Payer: Self-pay | Admitting: *Deleted

## 2011-12-10 NOTE — Telephone Encounter (Signed)
PT.STATES "SHE LOOKS WRINKLED." HER TOES ON BOTH FEET AND HER THUMB ON HER LEFT HAND ARE DRAWING." PT. HAS TAKEN HER FLUID PILL EVERY DAY UNTIL THIS MORNING. SHE HAS NOT TAKEN ANY MEDICATION TODAY. FLUID INTAKE FOR THE PAST 24 HOURS HAS BEEN 56 OUNCES. THIS NOTE TO DR.KHAN'S NURSE, Garald Braver.

## 2011-12-11 ENCOUNTER — Ambulatory Visit: Payer: Medicare Other | Admitting: Oncology

## 2011-12-11 ENCOUNTER — Encounter: Payer: Self-pay | Admitting: *Deleted

## 2011-12-11 ENCOUNTER — Other Ambulatory Visit: Payer: Medicare Other | Admitting: Lab

## 2011-12-11 ENCOUNTER — Other Ambulatory Visit (HOSPITAL_BASED_OUTPATIENT_CLINIC_OR_DEPARTMENT_OTHER): Payer: Medicare Other | Admitting: Lab

## 2011-12-11 DIAGNOSIS — C50919 Malignant neoplasm of unspecified site of unspecified female breast: Secondary | ICD-10-CM

## 2011-12-11 LAB — CBC WITH DIFFERENTIAL/PLATELET
BASO%: 0.2 % (ref 0.0–2.0)
EOS%: 0.2 % (ref 0.0–7.0)
HCT: 39.8 % (ref 34.8–46.6)
MCH: 29.8 pg (ref 25.1–34.0)
MCHC: 33.8 g/dL (ref 31.5–36.0)
MONO#: 0.6 10*3/uL (ref 0.1–0.9)
RBC: 4.52 10*6/uL (ref 3.70–5.45)
RDW: 14.4 % (ref 11.2–14.5)
WBC: 17.5 10*3/uL — ABNORMAL HIGH (ref 3.9–10.3)
lymph#: 1.8 10*3/uL (ref 0.9–3.3)

## 2011-12-11 LAB — COMPREHENSIVE METABOLIC PANEL
ALT: 21 U/L (ref 0–35)
AST: 20 U/L (ref 0–37)
CO2: 28 mEq/L (ref 19–32)
Calcium: 9.4 mg/dL (ref 8.4–10.5)
Chloride: 95 mEq/L — ABNORMAL LOW (ref 96–112)
Potassium: 3.5 mEq/L (ref 3.5–5.3)
Sodium: 137 mEq/L (ref 135–145)
Total Protein: 6.7 g/dL (ref 6.0–8.3)

## 2011-12-11 NOTE — Progress Notes (Signed)
PT. HERE FOR LAB AND ASKED TO SPEAK TO A TRIAGE NURSE. PT. MENTIONED SHE WAS HAVING SOME FLUTTERING IN HER CHEST. DR.KHAN INSTRUCTED PT. TO CONTACT HER PRIMARY CARE PHYSICIAN, DR.PERINI, OR GO TO THE EMERGENCY ROOM.

## 2011-12-21 ENCOUNTER — Other Ambulatory Visit: Payer: Self-pay | Admitting: Certified Registered Nurse Anesthetist

## 2011-12-23 ENCOUNTER — Encounter: Payer: Self-pay | Admitting: Family

## 2011-12-23 ENCOUNTER — Other Ambulatory Visit: Payer: Medicare Other | Admitting: Lab

## 2011-12-23 ENCOUNTER — Ambulatory Visit (HOSPITAL_BASED_OUTPATIENT_CLINIC_OR_DEPARTMENT_OTHER): Payer: Medicare Other

## 2011-12-23 ENCOUNTER — Other Ambulatory Visit: Payer: Self-pay | Admitting: Oncology

## 2011-12-23 ENCOUNTER — Ambulatory Visit (HOSPITAL_BASED_OUTPATIENT_CLINIC_OR_DEPARTMENT_OTHER): Payer: Medicare Other | Admitting: Family

## 2011-12-23 ENCOUNTER — Encounter: Payer: Self-pay | Admitting: *Deleted

## 2011-12-23 VITALS — BP 133/66 | HR 84 | Temp 98.2°F | Ht 64.5 in | Wt 232.9 lb

## 2011-12-23 DIAGNOSIS — C50911 Malignant neoplasm of unspecified site of right female breast: Secondary | ICD-10-CM

## 2011-12-23 DIAGNOSIS — Z5111 Encounter for antineoplastic chemotherapy: Secondary | ICD-10-CM

## 2011-12-23 DIAGNOSIS — C50919 Malignant neoplasm of unspecified site of unspecified female breast: Secondary | ICD-10-CM

## 2011-12-23 DIAGNOSIS — Z09 Encounter for follow-up examination after completed treatment for conditions other than malignant neoplasm: Secondary | ICD-10-CM

## 2011-12-23 LAB — CBC WITH DIFFERENTIAL/PLATELET
BASO%: 0.1 % (ref 0.0–2.0)
Basophils Absolute: 0 10*3/uL (ref 0.0–0.1)
EOS%: 0 % (ref 0.0–7.0)
Eosinophils Absolute: 0 10*3/uL (ref 0.0–0.5)
HCT: 35.9 % (ref 34.8–46.6)
HGB: 12.3 g/dL (ref 11.6–15.9)
LYMPH%: 5.6 % — ABNORMAL LOW (ref 14.0–49.7)
MCH: 29.1 pg (ref 25.1–34.0)
MCHC: 34.3 g/dL (ref 31.5–36.0)
MCV: 84.9 fL (ref 79.5–101.0)
MONO#: 0.6 10*3/uL (ref 0.1–0.9)
MONO%: 3.1 % (ref 0.0–14.0)
NEUT#: 18 10*3/uL — ABNORMAL HIGH (ref 1.5–6.5)
NEUT%: 91.2 % — ABNORMAL HIGH (ref 38.4–76.8)
Platelets: 315 10*3/uL (ref 145–400)
RBC: 4.23 10*6/uL (ref 3.70–5.45)
RDW: 15.4 % — ABNORMAL HIGH (ref 11.2–14.5)
WBC: 19.7 10*3/uL — ABNORMAL HIGH (ref 3.9–10.3)
lymph#: 1.1 10*3/uL (ref 0.9–3.3)
nRBC: 0 % (ref 0–0)

## 2011-12-23 LAB — BASIC METABOLIC PANEL WITH GFR
BUN: 21 mg/dL (ref 6–23)
CO2: 25 meq/L (ref 19–32)
Calcium: 9.8 mg/dL (ref 8.4–10.5)
Chloride: 101 meq/L (ref 96–112)
Creatinine, Ser: 0.85 mg/dL (ref 0.50–1.10)
Glucose, Bld: 146 mg/dL — ABNORMAL HIGH (ref 70–99)
Potassium: 3.9 meq/L (ref 3.5–5.3)
Sodium: 137 meq/L (ref 135–145)

## 2011-12-23 MED ORDER — SODIUM CHLORIDE 0.9 % IJ SOLN
10.0000 mL | INTRAMUSCULAR | Status: DC | PRN
  Administered 2011-12-23: 10 mL
  Filled 2011-12-23: qty 10

## 2011-12-23 MED ORDER — ONDANSETRON 16 MG/50ML IVPB (CHCC)
16.0000 mg | Freq: Once | INTRAVENOUS | Status: AC
  Administered 2011-12-23: 16 mg via INTRAVENOUS

## 2011-12-23 MED ORDER — HEPARIN SOD (PORK) LOCK FLUSH 100 UNIT/ML IV SOLN
500.0000 [IU] | Freq: Once | INTRAVENOUS | Status: AC | PRN
  Administered 2011-12-23: 500 [IU]
  Filled 2011-12-23: qty 5

## 2011-12-23 MED ORDER — SODIUM CHLORIDE 0.9 % IV SOLN
600.0000 mg/m2 | Freq: Once | INTRAVENOUS | Status: AC
  Administered 2011-12-23: 1340 mg via INTRAVENOUS
  Filled 2011-12-23: qty 67

## 2011-12-23 MED ORDER — DOCETAXEL CHEMO INJECTION 160 MG/16ML
75.0000 mg/m2 | Freq: Once | INTRAVENOUS | Status: AC
  Administered 2011-12-23: 170 mg via INTRAVENOUS
  Filled 2011-12-23: qty 17

## 2011-12-23 MED ORDER — SODIUM CHLORIDE 0.9 % IV SOLN
Freq: Once | INTRAVENOUS | Status: AC
  Administered 2011-12-23: 50 mL via INTRAVENOUS

## 2011-12-23 MED ORDER — DEXAMETHASONE SODIUM PHOSPHATE 4 MG/ML IJ SOLN
20.0000 mg | Freq: Once | INTRAMUSCULAR | Status: AC
  Administered 2011-12-23: 20 mg via INTRAVENOUS

## 2011-12-23 NOTE — Patient Instructions (Signed)
1410 Pt ambulatory upon discharge and verbalized understanding of next appt date/time and to call if any problems.

## 2011-12-23 NOTE — Progress Notes (Signed)
PORT PLACEMENT:  Xray on 12/7 noted per Dr. Rhodia Albright (tip is located a the T8 vertebral level just above the level of the superior cavoatrial junction, 3.8 cm below the level of the carina). Called and spoke to Dr. Kyung Rudd myself to clarify above.  Per.Dr. Kyung Rudd, it is near the bottome of SVC at close to [address]. It is in proper placement.  HL

## 2011-12-23 NOTE — Progress Notes (Signed)
[facility]  Name: [name]                  DATE: 12/23/2011 MRN: [id number]                      DOB: 12-Oct-1941  CC: [name], MD [name], MD          REFERRING PHYSICIAN: [name], MD  DIAGNOSIS: Patient Active Problem List  Diagnoses Date Noted  . Breast cancer, right breast 11/04/2011      CURRENT THERAPY: TC X 6 cycles, Neulasta day 2 each cycle. She is here to begin cycle 2.    INTERIM HISTORY: Status post ultrasound-guided core biopsy of a self-detected right mass, 11:00 position 3 cm from the nipple. Pathology revealed invasive mammary carcinoma with carcinoma in situ Grade 2, ER+ 100%, PR+ 3%, proliferation marker 38%, HER-2/neu negative. Subsequent MRI of the breast showed 2.5 x 2.4 x 1.8 cm spiculated enhancing mass in the anterior aspect, upper outer quadrant of the right breast with smaller linear and nodular enhancing masses extending to the right nipple. Punch biopsy of the right nipple revealed invasive mammary carcinoma. Genetic testing for BRCA 1/2 showed no deleterious mutation.  Port-a-Cath in the right subclavian worked well during round one of chemo.   Tolerated cycle 1 of TC well with minor complaints. Mistakenly took 2 doses of diuretic several days following chemo and felt like potassium was low. No nausea or vomiting. Bowel and bladder function are normal. She is drinking and eating normally.   CURRENT THERAPY: Patient here for cycle 2, day 1 of neoadjuvant Taxotere/Cytoxan. She has Neulasta ordered for day 2 of each cycle.    PHYSICAL EXAM: BP 133/66  Pulse 84  Temp(Src) 98.2 F (36.8 C) (Oral)  Ht 5' 4.5" (1.638 m)  Wt 232 lb 14.4 oz (105.643 kg)  BMI 39.36 kg/m2 General: Well developed, well nourished black female in no acute distress. Wears a wig for chemo-associated alopecia.  EENT: No ocular or oral lesions. No stomatitis. Port site in right subclavian without edema or signs/symptoms of infection.  Respiratory:  Lungs are clear to auscultation bilaterally with normal respiratory movement and no accessory muscle use. Cardiac: No murmur, rub or tachycardia. No upper or lower extremity edema.  GI: Abdomen soft, no palpable hepatosplenomegaly. No fluid wave. No tenderness. Musculoskeletal: No kyphosis, no tenderness over the spine, ribs or hips. Lymph: No cervical, infraclavicular, axillary or inguinal adenopathy. Neuro: No focal neurological deficits. Psych: Alert and oriented X 3, appropriate mood and affect.  BREAST EXAM: Breasts are large and difficult to examine. In the supine position, with the right arm over the head, right nipple is everted. No periareolar edema or nipple discharge. Right upper outer quadrant, 1 cm firm, tethered mass. Mo mass in any other quadrant or subareolar region. No redness of the skin. No right axillary adenopathy. With the left arm over the head, left nipple is everted. No periareolar edema or nipple discharge. No mass in any quadrant or subareolar region. No redness of the skin. No left axillary adenopathy.    SOCIAL HISTORY:  . Marital Status: Single   Occupational History  . Retired Designer, jewellery    Social History Main Topics  . Smoking status: Never Smoker   . Smokeless tobacco: Never Used  . Alcohol Use: No  . Drug Use: No   LABORATORY STUDIES:   Results for orders placed in visit on 12/23/11  CBC  WITH DIFFERENTIAL      Component Value Range   WBC 19.7 (*) 3.9 - 10.3 (10e3/uL)   NEUT# 18.0 (*) 1.5 - 6.5 (10e3/uL)   HGB 12.3  11.6 - 15.9 (g/dL)   HCT 81.1  91.4 - 78.2 (%)   Platelets 315  145 - 400 (10e3/uL)   MCV 84.9  79.5 - 101.0 (fL)   MCH 29.1  25.1 - 34.0 (pg)   MCHC 34.3  31.5 - 36.0 (g/dL)   RBC 9.56  2.13 - 0.86 (10e6/uL)   RDW 15.4 (*) 11.2 - 14.5 (%)   lymph# 1.1  0.9 - 3.3 (10e3/uL)   MONO# 0.6  0.1 - 0.9 (10e3/uL)   Eosinophils Absolute 0.0  0.0 - 0.5 (10e3/uL)   Basophils Absolute 0.0  0.0 - 0.1 (10e3/uL)   NEUT% 91.2 (*) 38.4 - 76.8  (%)   LYMPH% 5.6 (*) 14.0 - 49.7 (%)   MONO% 3.1  0.0 - 14.0 (%)   EOS% 0.0  0.0 - 7.0 (%)   BASO% 0.1  0.0 - 2.0 (%)   nRBC 0  0 - 0 (%)    IMPRESSION:  71 y/o female with:  1. Neglected right breast cancer, UOQ, ER/PR+, HER-2 not overexpressed, receiving neoadjuvant TC with good tolerance.  2. Good response to Neulasta after round 1 of chemo, WBC 19.7.   PLAN:   1. Treatment today.  2. Hold Neulasta tomorrow for WBC 19.7.  3. Return to clinic to see Dr. Welton Flakes 12/31/2011 prior to next treatment.

## 2011-12-23 NOTE — Progress Notes (Signed)
Per Colman Cater, pt.'s count good and will not required to have neulasta tomorrow 12/24/11. She informed pt. Herself.  Pt. aware appt. cancelled.

## 2011-12-24 ENCOUNTER — Ambulatory Visit: Payer: Medicare Other | Admitting: Family

## 2011-12-24 ENCOUNTER — Ambulatory Visit: Payer: Medicare Other

## 2011-12-31 ENCOUNTER — Other Ambulatory Visit (HOSPITAL_BASED_OUTPATIENT_CLINIC_OR_DEPARTMENT_OTHER): Payer: Medicare Other | Admitting: Lab

## 2011-12-31 ENCOUNTER — Ambulatory Visit (HOSPITAL_BASED_OUTPATIENT_CLINIC_OR_DEPARTMENT_OTHER): Payer: Medicare Other | Admitting: Oncology

## 2011-12-31 DIAGNOSIS — C50019 Malignant neoplasm of nipple and areola, unspecified female breast: Secondary | ICD-10-CM

## 2011-12-31 DIAGNOSIS — C50911 Malignant neoplasm of unspecified site of right female breast: Secondary | ICD-10-CM

## 2011-12-31 DIAGNOSIS — Z09 Encounter for follow-up examination after completed treatment for conditions other than malignant neoplasm: Secondary | ICD-10-CM

## 2011-12-31 DIAGNOSIS — D709 Neutropenia, unspecified: Secondary | ICD-10-CM

## 2011-12-31 DIAGNOSIS — C50919 Malignant neoplasm of unspecified site of unspecified female breast: Secondary | ICD-10-CM

## 2011-12-31 LAB — BASIC METABOLIC PANEL
CO2: 30 mEq/L (ref 19–32)
Calcium: 9.2 mg/dL (ref 8.4–10.5)
Creatinine, Ser: 1 mg/dL (ref 0.50–1.10)
Sodium: 136 mEq/L (ref 135–145)

## 2011-12-31 LAB — CBC WITH DIFFERENTIAL/PLATELET
BASO%: 2.5 % — ABNORMAL HIGH (ref 0.0–2.0)
Eosinophils Absolute: 0 10*3/uL (ref 0.0–0.5)
MCHC: 34.2 g/dL (ref 31.5–36.0)
MCV: 84.4 fL (ref 79.5–101.0)
MONO%: 10.1 % (ref 0.0–14.0)
NEUT#: 0 10*3/uL — CL (ref 1.5–6.5)
RBC: 4.3 10*6/uL (ref 3.70–5.45)
RDW: 14.9 % — ABNORMAL HIGH (ref 11.2–14.5)
WBC: 0.8 10*3/uL — CL (ref 3.9–10.3)
nRBC: 0 % (ref 0–0)

## 2011-12-31 MED ORDER — CIPROFLOXACIN HCL 500 MG PO TABS: 500.0000 mg | ORAL_TABLET | Freq: Two times a day (BID) | ORAL | Status: AC

## 2011-12-31 NOTE — Progress Notes (Signed)
OFFICE PROGRESS NOTE  CC Jasmine Eastern, MD Jasmine Kayser, MD, MD [name] [facility] [place] [address]  DIAGNOSIS: 71 year old female with invasive ductal carcinoma of the right breast involving the right nipple diagnosed November 2012 appear  PRIOR THERAPY:   #1 status post ultrasound-guided core biopsy of the mass at the 11:00 position 3 cm from the right nipple. The pathology revealed invasive mammary carcinoma with carcinoma in situ grade 2 tumor was estrogen receptor +100% progesterone receptor +3% proliferation marker 38% HER-2/neu negative.  #2 MRI of the breast showed 2.5 x 2.4 x 1.8 cm spiculated enhancing mass in the anterior aspect of the upper outer quadrant of the right breast with smaller linear and nodular enhancing masses extending to the biopsied mass to the right nipple.  #3 patient is status post punch biopsy of the right nipple with the pathology revealing an invasive mammary carcinoma as well.  CURRENT THERAPY: Patient is on neoadjuvant chemotherapy consisting of Taxotere and Cytoxan. She is status post cycle 2.  INTERVAL HISTORY: [name] 71 y.o. female returns for followup visit today. Patient is seen in followup visit today for interim labs. Clinically she seems to be doing quite well. She did have a temperature of 99 at home. She otherwise denies any headaches visual disturbances shortness of breath chest pains palpitations she has no myalgias or arthralgias. Overall she is tolerating her chemotherapy very well. She is denying any nausea or vomiting she denies any abdominal pain no hematuria hematochezia melena hemoptysis or hematemesis. Remainder of the 10 point review of systems is negative. MEDICAL HISTORY: Past Medical History  Diagnosis Date  . GERD (gastroesophageal reflux disease)   . Asthma     daily and prn inhalers  . Sleep apnea     uses CPAP occ. - to bring machine DOS  . Grave's disease     no current meds.    . Arthritis     osteo - s/p right total knee  . Spondylosis     cervical and lumbar  . Hyperlipidemia   . Breast cancer   . Diabetes mellitus     diet-controlled    ALLERGIES:  is allergic to zocor.  MEDICATIONS:  Current Outpatient Prescriptions  Medication Sig Dispense Refill  . albuterol (PROVENTIL HFA;VENTOLIN HFA) 108 (90 BASE) MCG/ACT inhaler Inhale 2 puffs into the lungs as needed.        Marland Kitchen aspirin 81 MG tablet Take 81 mg by mouth daily.       . ciprofloxacin (CIPRO) 500 MG tablet Take 1 tablet (500 mg total) by mouth 2 (two) times daily.  14 tablet  5  . cyclobenzaprine (FLEXERIL) 10 MG tablet Take 5 mg by mouth 3 (three) times daily as needed. For muscle spasms      . diltiazem (CARDIZEM CD) 240 MG 24 hr capsule Take 240 mg by mouth daily. AM      . ezetimibe (ZETIA) 10 MG tablet Take 10 mg by mouth daily. AM      . Fluticasone-Salmeterol (ADVAIR) 100-50 MCG/DOSE AEPB Inhale 1 puff into the lungs every 12 (twelve) hours.       . lansoprazole (PREVACID) 30 MG capsule Take 30 mg by mouth 2 (two) times daily.       Marland Kitchen lidocaine-prilocaine (EMLA) cream Apply topically as needed.  30 g  4  . meloxicam (MOBIC) 7.5 MG tablet Take 5 mg by mouth daily as needed. For knee tightness      .  ondansetron (ZOFRAN) 8 MG tablet Take by mouth every 8 (eight) hours as needed.        . prochlorperazine (COMPAZINE) 10 MG tablet Take 10 mg by mouth every 6 (six) hours as needed.        . rosuvastatin (CRESTOR) 5 MG tablet Take 5 mg by mouth daily. AM      . triamterene-hydrochlorothiazide (MAXZIDE-25) 37.5-25 MG per tablet Take 1 tablet by mouth daily. AM        SURGICAL HISTORY:  Past Surgical History  Procedure Date  . Lumbar laminectomy     back surg. x 2 - 1980's and 1999  . Total knee arthroplasty 07/28/2006    right  . Cataract extraction, bilateral   . Bilateral oophorectomy 2009  . Partial hysterectomy   . Cardiac catheterization 04/10/2010, 05/07/2004    LAD lesion, blood flow OK,  per pt.  . Portacath placement 11/27/2011    Procedure: INSERTION PORT-A-CATH;  Surgeon: Ernestene Mention, MD;  Location: [facility];  Service: General;  Laterality: Right;  . Lesion removal 11/27/2011    Procedure: LESION REMOVAL;  Surgeon: Ernestene Mention, MD;  Location: [facility];  Service: General;  Laterality: Right;  Skin tag removed from under right eye. No specimen  . Abdominal hysterectomy         ECOG PERFORMANCE STATUS: 0 - Asymptomatic  Blood pressure 123/69, pulse 106, temperature 99 F (37.2 C), temperature source Oral, height 5' 4.5" (1.638 m), weight 227 lb 3.2 oz (103.057 kg).  LABORATORY DATA: Lab Results  Component Value Date   WBC 0.8* 12/31/2011   HGB 12.4 12/31/2011   HCT 36.3 12/31/2011   MCV 84.4 12/31/2011   PLT 311 12/31/2011      Chemistry      Component Value Date/Time   NA 136 12/31/2011 1029   K 3.4* 12/31/2011 1029   CL 96 12/31/2011 1029   CO2 30 12/31/2011 1029   BUN 21 12/31/2011 1029   CREATININE 1.00 12/31/2011 1029      Component Value Date/Time   CALCIUM 9.2 12/31/2011 1029   ALKPHOS 87 12/11/2011 1009   AST 20 12/11/2011 1009   ALT 21 12/11/2011 1009   BILITOT 0.3 12/11/2011 1009       RADIOGRAPHIC STUDIES:  No results found.  ASSESSMENT: 71 year old female with 7.5 cm mass in the right breast with positive skin biopsy at the nipple of the right breast. Patient's tumor is ER positive PR positive HER-2/neu negative. She is now receiving neoadjuvant chemotherapy she has completed a total of 2 cycles. Patient is now neutropenic with a total white count of 0.8 and an ANC of 0.  PLAN: patient was and I discussed neutropenic precautions. She will take her temperature. She was given a prescription for Cipro 500 mg twice a day. She was instructed if she developed a temperature greater than 100.4 she is to call immediately. She will be seen back in 2 weeks' time for followup or sooner if need arises.  All questions  were answered. The patient knows to call the clinic with any problems, questions or concerns. We can certainly see the patient much sooner if necessary.  I spent 25 minutes counseling the patient and coordinating care with a 30 minute appointment.    Drue Second, MD Medical/Oncology [facility] [phone] (beeper) [phone] (Office)  12/31/2011, 6:12 PM

## 2012-01-05 NOTE — Telephone Encounter (Signed)
Error, no note.

## 2012-01-13 ENCOUNTER — Other Ambulatory Visit (HOSPITAL_BASED_OUTPATIENT_CLINIC_OR_DEPARTMENT_OTHER): Payer: Medicare Other | Admitting: Lab

## 2012-01-13 ENCOUNTER — Ambulatory Visit (HOSPITAL_BASED_OUTPATIENT_CLINIC_OR_DEPARTMENT_OTHER): Payer: Medicare Other | Admitting: Oncology

## 2012-01-13 ENCOUNTER — Encounter: Payer: Self-pay | Admitting: Oncology

## 2012-01-13 ENCOUNTER — Ambulatory Visit (HOSPITAL_BASED_OUTPATIENT_CLINIC_OR_DEPARTMENT_OTHER): Payer: Medicare Other

## 2012-01-13 VITALS — BP 143/63 | HR 101 | Temp 98.6°F | Ht 64.5 in | Wt 231.2 lb

## 2012-01-13 DIAGNOSIS — Z170 Estrogen receptor positive status [ER+]: Secondary | ICD-10-CM

## 2012-01-13 DIAGNOSIS — C50911 Malignant neoplasm of unspecified site of right female breast: Secondary | ICD-10-CM

## 2012-01-13 DIAGNOSIS — Z09 Encounter for follow-up examination after completed treatment for conditions other than malignant neoplasm: Secondary | ICD-10-CM

## 2012-01-13 DIAGNOSIS — C50919 Malignant neoplasm of unspecified site of unspecified female breast: Secondary | ICD-10-CM

## 2012-01-13 DIAGNOSIS — Z5111 Encounter for antineoplastic chemotherapy: Secondary | ICD-10-CM

## 2012-01-13 LAB — BASIC METABOLIC PANEL
BUN: 25 mg/dL — ABNORMAL HIGH (ref 6–23)
Chloride: 99 mEq/L (ref 96–112)
Glucose, Bld: 154 mg/dL — ABNORMAL HIGH (ref 70–99)
Potassium: 3.8 mEq/L (ref 3.5–5.3)
Sodium: 135 mEq/L (ref 135–145)

## 2012-01-13 LAB — CBC WITH DIFFERENTIAL/PLATELET
Basophils Absolute: 0 10*3/uL (ref 0.0–0.1)
Eosinophils Absolute: 0 10*3/uL (ref 0.0–0.5)
HCT: 35.2 % (ref 34.8–46.6)
HGB: 12.2 g/dL (ref 11.6–15.9)
MONO#: 0.3 10*3/uL (ref 0.1–0.9)
NEUT#: 18.4 10*3/uL — ABNORMAL HIGH (ref 1.5–6.5)
NEUT%: 91.9 % — ABNORMAL HIGH (ref 38.4–76.8)
RDW: 15.6 % — ABNORMAL HIGH (ref 11.2–14.5)
WBC: 20 10*3/uL — ABNORMAL HIGH (ref 3.9–10.3)
lymph#: 1.4 10*3/uL (ref 0.9–3.3)

## 2012-01-13 MED ORDER — ONDANSETRON 16 MG/50ML IVPB (CHCC)
16.0000 mg | Freq: Once | INTRAVENOUS | Status: AC
  Administered 2012-01-13: 16 mg via INTRAVENOUS

## 2012-01-13 MED ORDER — SODIUM CHLORIDE 0.9 % IV SOLN
Freq: Once | INTRAVENOUS | Status: AC
  Administered 2012-01-13: 15:00:00 via INTRAVENOUS

## 2012-01-13 MED ORDER — DEXAMETHASONE SODIUM PHOSPHATE 4 MG/ML IJ SOLN
20.0000 mg | Freq: Once | INTRAMUSCULAR | Status: AC
  Administered 2012-01-13: 20 mg via INTRAVENOUS

## 2012-01-13 MED ORDER — DOCETAXEL CHEMO INJECTION 160 MG/16ML
75.0000 mg/m2 | Freq: Once | INTRAVENOUS | Status: AC
  Administered 2012-01-13: 170 mg via INTRAVENOUS
  Filled 2012-01-13: qty 17

## 2012-01-13 MED ORDER — SODIUM CHLORIDE 0.9 % IV SOLN
600.0000 mg/m2 | Freq: Once | INTRAVENOUS | Status: AC
  Administered 2012-01-13: 1340 mg via INTRAVENOUS
  Filled 2012-01-13: qty 67

## 2012-01-13 NOTE — Progress Notes (Signed)
OFFICE PROGRESS NOTE  CC [name], MD [name], MD, MD [address] [name]. [facility] [place] [address]  DIAGNOSIS: 71 year old female with invasive ductal carcinoma of the right breast involving the right nipple diagnosed November 2012 appear  PRIOR THERAPY:   #1 status post ultrasound-guided core biopsy of the mass at the 11:00 position 3 cm from the right nipple. The pathology revealed invasive mammary carcinoma with carcinoma in situ grade 2 tumor was estrogen receptor +100% progesterone receptor +3% proliferation marker 38% HER-2/neu negative.  #2 MRI of the breast showed 2.5 x 2.4 x 1.8 cm spiculated enhancing mass in the anterior aspect of the upper outer quadrant of the right breast with smaller linear and nodular enhancing masses extending to the biopsied mass to the right nipple.  #3 patient is status post punch biopsy of the right nipple with the pathology revealing an invasive mammary carcinoma as well.  CURRENT THERAPY: Patient is on neoadjuvant chemotherapy consisting of Taxotere and Cytoxan. Here for cycle 3/6  INTERVAL HISTORY: [name] 71 y.o. female returns for followup visit today. Patient is seen in followup visit today for Scheduled chemotherapy. Overall she is doing well. She did develop significant neutropenia with her last chemotherapy as we had skipped the Neulasta. Today she is doing well she denies any fevers chills night sweats headaches no shortness of breath or chest pains or palpitations. She has no myalgias or arthralgias. She is quite pleased with the chemotherapy that she is receiving. She does complain of having some nailbed changes which is normal and she is reassured. She has no hematuria hematochezia melena hemoptysis or hematemesis. She has not noticed any vaginal discharge or bleeding. Remainder of the 10 point review of systems is negative.  MEDICAL HISTORY: Past Medical History  Diagnosis Date  . GERD  (gastroesophageal reflux disease)   . Asthma     daily and prn inhalers  . Sleep apnea     uses CPAP occ. - to bring machine DOS  . Grave's disease     no current meds.  . Arthritis     osteo - s/p right total knee  . Spondylosis     cervical and lumbar  . Hyperlipidemia   . Breast cancer   . Diabetes mellitus     diet-controlled    ALLERGIES:  is allergic to zocor.  MEDICATIONS:  Current Outpatient Prescriptions  Medication Sig Dispense Refill  . albuterol (PROVENTIL HFA;VENTOLIN HFA) 108 (90 BASE) MCG/ACT inhaler Inhale 2 puffs into the lungs as needed.        Marland Kitchen aspirin 81 MG tablet Take 81 mg by mouth daily.       . cyclobenzaprine (FLEXERIL) 10 MG tablet Take 5 mg by mouth 3 (three) times daily as needed. For muscle spasms      . diltiazem (CARDIZEM CD) 240 MG 24 hr capsule Take 240 mg by mouth daily. AM      . ezetimibe (ZETIA) 10 MG tablet Take 10 mg by mouth daily. AM      . Fluticasone-Salmeterol (ADVAIR) 100-50 MCG/DOSE AEPB Inhale 1 puff into the lungs every 12 (twelve) hours.       . lansoprazole (PREVACID) 30 MG capsule Take 30 mg by mouth 2 (two) times daily.       Marland Kitchen lidocaine-prilocaine (EMLA) cream Apply topically as needed.  30 g  4  . meloxicam (MOBIC) 7.5 MG tablet Take 5 mg by mouth daily as needed. For knee tightness      .  ondansetron (ZOFRAN) 8 MG tablet Take by mouth every 8 (eight) hours as needed.        . prochlorperazine (COMPAZINE) 10 MG tablet Take 10 mg by mouth every 6 (six) hours as needed.        . rosuvastatin (CRESTOR) 5 MG tablet Take 5 mg by mouth daily. AM      . triamterene-hydrochlorothiazide (MAXZIDE-25) 37.5-25 MG per tablet Take 1 tablet by mouth daily. AM        SURGICAL HISTORY:  Past Surgical History  Procedure Date  . Lumbar laminectomy     back surg. x 2 - 1980's and 1999  . Total knee arthroplasty 07/28/2006    right  . Cataract extraction, bilateral   . Bilateral oophorectomy 2009  . Partial hysterectomy   . Cardiac  catheterization 04/10/2010, 05/07/2004    LAD lesion, blood flow OK, per pt.  . Portacath placement 11/27/2011    Procedure: INSERTION PORT-A-CATH;  Surgeon: Ernestene Mention, MD;  Location: [facility];  Service: General;  Laterality: Right;  . Lesion removal 11/27/2011    Procedure: LESION REMOVAL;  Surgeon: Ernestene Mention, MD;  Location: [facility];  Service: General;  Laterality: Right;  Skin tag removed from under right eye. No specimen  . Abdominal hysterectomy         ECOG PERFORMANCE STATUS: 0 - Asymptomatic  Blood pressure 143/63, pulse 101, temperature 98.6 F (37 C), height 5' 4.5" (1.638 m), weight 231 lb 3.2 oz (104.872 kg).  LABORATORY DATA: Lab Results  Component Value Date   WBC 20.0* 01/13/2012   HGB 12.2 01/13/2012   HCT 35.2 01/13/2012   MCV 84.2 01/13/2012   PLT 314 01/13/2012      Chemistry      Component Value Date/Time   NA 136 12/31/2011 1029   K 3.4* 12/31/2011 1029   CL 96 12/31/2011 1029   CO2 30 12/31/2011 1029   BUN 21 12/31/2011 1029   CREATININE 1.00 12/31/2011 1029      Component Value Date/Time   CALCIUM 9.2 12/31/2011 1029   ALKPHOS 87 12/11/2011 1009   AST 20 12/11/2011 1009   ALT 21 12/11/2011 1009   BILITOT 0.3 12/11/2011 1009       RADIOGRAPHIC STUDIES:  No results found.  ASSESSMENT: 71 year old female with 7.5 cm mass in the right breast with positive skin biopsy at the nipple of the right breast. Patient's tumor is ER positive PR positive HER-2/neu negative. She is now receiving neoadjuvant chemotherapy she has completed a total of 2 cycles. She is now here for cycle #3 day 1. Overall she is doing well. With this cycle I will plan on giving her Neulasta on day 2 which will be tomorrow January 14 2012. I have gone over her schedule in great detail.  PLAN: #1 overall patient is doing well with her chemotherapy. Her CBC looks great her white count today is 20. We will proceed with her scheduled Taxotere and  Cytoxan today. She will come back tomorrow for day 2 Neulasta   #2 she will be seen back in one week's time for interim labs.  #3 once patient is close to completing her chemotherapy I will plan on sending her back to Dr. [name] for discussion of surgery.  All questions were answered. The patient knows to call the clinic with any problems, questions or concerns. We can certainly see the patient much sooner if necessary.  I spent 25 minutes counseling the  patient and coordinating care with a 30 minute appointment.    Drue Second, MD Medical/Oncology [facility] [phone] (beeper) [phone] (Office)  01/13/2012, 2:33 PM

## 2012-01-14 ENCOUNTER — Ambulatory Visit (HOSPITAL_BASED_OUTPATIENT_CLINIC_OR_DEPARTMENT_OTHER): Payer: Medicare Other

## 2012-01-14 VITALS — BP 139/72 | HR 92 | Temp 99.3°F

## 2012-01-14 DIAGNOSIS — Z5189 Encounter for other specified aftercare: Secondary | ICD-10-CM

## 2012-01-14 DIAGNOSIS — C50919 Malignant neoplasm of unspecified site of unspecified female breast: Secondary | ICD-10-CM

## 2012-01-14 DIAGNOSIS — C50911 Malignant neoplasm of unspecified site of right female breast: Secondary | ICD-10-CM

## 2012-01-14 MED ORDER — PEGFILGRASTIM INJECTION 6 MG/0.6ML
6.0000 mg | Freq: Once | SUBCUTANEOUS | Status: AC
  Administered 2012-01-14: 6 mg via SUBCUTANEOUS
  Filled 2012-01-14: qty 0.6

## 2012-01-20 ENCOUNTER — Other Ambulatory Visit (HOSPITAL_BASED_OUTPATIENT_CLINIC_OR_DEPARTMENT_OTHER): Payer: Medicare Other | Admitting: Lab

## 2012-01-20 ENCOUNTER — Ambulatory Visit (HOSPITAL_BASED_OUTPATIENT_CLINIC_OR_DEPARTMENT_OTHER): Payer: Medicare Other | Admitting: Oncology

## 2012-01-20 VITALS — BP 123/70 | HR 116 | Temp 98.8°F | Ht 64.5 in | Wt 225.3 lb

## 2012-01-20 DIAGNOSIS — Z09 Encounter for follow-up examination after completed treatment for conditions other than malignant neoplasm: Secondary | ICD-10-CM

## 2012-01-20 DIAGNOSIS — C50911 Malignant neoplasm of unspecified site of right female breast: Secondary | ICD-10-CM

## 2012-01-20 DIAGNOSIS — C50919 Malignant neoplasm of unspecified site of unspecified female breast: Secondary | ICD-10-CM

## 2012-01-20 LAB — CBC WITH DIFFERENTIAL/PLATELET
EOS%: 1 % (ref 0.0–7.0)
Eosinophils Absolute: 0.1 10*3/uL (ref 0.0–0.5)
MCV: 84.5 fL (ref 79.5–101.0)
MONO%: 22.6 % — ABNORMAL HIGH (ref 0.0–14.0)
NEUT#: 3.5 10*3/uL (ref 1.5–6.5)
RBC: 4.12 10*6/uL (ref 3.70–5.45)
RDW: 15.5 % — ABNORMAL HIGH (ref 11.2–14.5)
lymph#: 1.2 10*3/uL (ref 0.9–3.3)
nRBC: 3 % — ABNORMAL HIGH (ref 0–0)

## 2012-01-20 LAB — BASIC METABOLIC PANEL
Calcium: 9.5 mg/dL (ref 8.4–10.5)
Sodium: 137 mEq/L (ref 135–145)

## 2012-01-22 NOTE — Progress Notes (Signed)
OFFICE PROGRESS NOTE  CC Jasmine Kelp, MD [name], MD, MD [address] [name]. [facility] [place] [address]  DIAGNOSIS: 71 year old female with invasive ductal carcinoma of the right breast involving the right nipple diagnosed November 2012 appear  PRIOR THERAPY:   #1 status post ultrasound-guided core biopsy of the mass at the 11:00 position 3 cm from the right nipple. The pathology revealed invasive mammary carcinoma with carcinoma in situ grade 2 tumor was estrogen receptor +100% progesterone receptor +3% proliferation marker 38% HER-2/neu negative.  #2 MRI of the breast showed 2.5 x 2.4 x 1.8 cm spiculated enhancing mass in the anterior aspect of the upper outer quadrant of the right breast with smaller linear and nodular enhancing masses extending to the biopsied mass to the right nipple.  #3 patient is status post punch biopsy of the right nipple with the pathology revealing an invasive mammary carcinoma as well.  CURRENT THERAPY: Patient is on neoadjuvant chemotherapy consisting of Taxotere and Cytoxan. Status post cycle #3 she is here for interim labs and followup  INTERVAL HISTORY: [name] 71 y.o. female returns for followup visit today. Overall she is doing well she tolerated cycle 3 of her chemotherapy well without any significant complaints. Today she denies any fevers chills night sweats headaches shortness of breath chest pains palpitations no difficulty in swallowing her porta site cath looks great she has no nausea or vomiting no hematuria hematochezia melena hemoptysis hematemesis no easy bruising she has not noticed any skin changes.[name] of the 10 point review of systems is negative.  MEDICAL HISTORY: Past Medical History  Diagnosis Date  . GERD (gastroesophageal reflux disease)   . Asthma     daily and prn inhalers  . Sleep apnea     uses CPAP occ. - to bring machine DOS  . Grave's disease     no current meds.  . Arthritis       osteo - s/p right total knee  . Spondylosis     cervical and lumbar  . Hyperlipidemia   . Breast cancer   . Diabetes mellitus     diet-controlled    ALLERGIES:  is allergic to zocor.  MEDICATIONS:  Current Outpatient Prescriptions  Medication Sig Dispense Refill  . albuterol (PROVENTIL HFA;VENTOLIN HFA) 108 (90 BASE) MCG/ACT inhaler Inhale 2 puffs into the lungs as needed.        Marland Kitchen aspirin 81 MG tablet Take 81 mg by mouth daily.       . cyclobenzaprine (FLEXERIL) 10 MG tablet Take 5 mg by mouth 3 (three) times daily as needed. For muscle spasms      . diltiazem (CARDIZEM CD) 240 MG 24 hr capsule Take 240 mg by mouth daily. AM      . ezetimibe (ZETIA) 10 MG tablet Take 10 mg by mouth daily. AM      . Fluticasone-Salmeterol (ADVAIR) 100-50 MCG/DOSE AEPB Inhale 1 puff into the lungs every 12 (twelve) hours.       . lansoprazole (PREVACID) 30 MG capsule Take 30 mg by mouth 2 (two) times daily.       Marland Kitchen lidocaine-prilocaine (EMLA) cream Apply topically as needed.  30 g  4  . meloxicam (MOBIC) 7.5 MG tablet Take 5 mg by mouth daily as needed. For knee tightness      . ondansetron (ZOFRAN) 8 MG tablet Take by mouth every 8 (eight) hours as needed.        . prochlorperazine (COMPAZINE) 10  MG tablet Take 10 mg by mouth every 6 (six) hours as needed.        . rosuvastatin (CRESTOR) 5 MG tablet Take 5 mg by mouth daily. AM      . triamterene-hydrochlorothiazide (MAXZIDE-25) 37.5-25 MG per tablet Take 1 tablet by mouth daily. AM        SURGICAL HISTORY:  Past Surgical History  Procedure Date  . Lumbar laminectomy     back surg. x 2 - 1980's and 1999  . Total knee arthroplasty 07/28/2006    right  . Cataract extraction, bilateral   . Bilateral oophorectomy 2009  . Partial hysterectomy   . Cardiac catheterization 04/10/2010, 05/07/2004    LAD lesion, blood flow OK, per pt.  . Portacath placement 11/27/2011    Procedure: INSERTION PORT-A-CATH;  Surgeon: Ernestene Mention, MD;  Location: [facility];  Service: General;  Laterality: Right;  . Lesion removal 11/27/2011    Procedure: LESION REMOVAL;  Surgeon: Ernestene Mention, MD;  Location: [facility];  Service: General;  Laterality: Right;  Skin tag removed from under right eye. No specimen  . Abdominal hysterectomy         ECOG PERFORMANCE STATUS: 0 - Asymptomatic  Blood pressure 123/70, pulse 116, temperature 98.8 F (37.1 C), temperature source Oral, height 5' 4.5" (1.638 m), weight 225 lb 4.8 oz (102.195 kg). Physical examination:  EOMI PERRLA sclerae anicteric no conjunctival pallor oral mucosa is moist neck is supple lungs are clear bilaterally to auscultation and percussion cardiovascular is regular rate rhythm no murmurs gallops or rubs abdomen is soft nontender nondistended bowel sounds are present no hepatosplenomegaly extremities no clubbing edema or cyanosis neuro patient's alert oriented x3 strength is symmetrical in upper and lower extremities.  Breast examination left breast no masses nipple discharge right breast no masses nipple discharge no skin changes or  LABORATORY DATA: Lab Results  Component Value Date   WBC 6.2 01/20/2012   HGB 11.9 01/20/2012   HCT 34.8 01/20/2012   MCV 84.5 01/20/2012   PLT 201 01/20/2012      Chemistry      Component Value Date/Time   NA 137 01/20/2012 1443   K 3.2* 01/20/2012 1443   CL 96 01/20/2012 1443   CO2 32 01/20/2012 1443   BUN 19 01/20/2012 1443   CREATININE 1.07 01/20/2012 1443      Component Value Date/Time   CALCIUM 9.5 01/20/2012 1443   ALKPHOS 87 12/11/2011 1009   AST 20 12/11/2011 1009   ALT 21 12/11/2011 1009   BILITOT 0.3 12/11/2011 1009       RADIOGRAPHIC STUDIES:  No results found.  ASSESSMENT: 71 year old female with 1.  7.5 cm mass in the right breast with positive skin biopsy at the nipple of the right breast. Patient's tumor is ER positive PR positive HER-2/neu negative.   2. She is now receiving neoadjuvant  chemotherapy she has completed a total of 2 cycles. She is s/p cycle #3.  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 She did receive Neulasta will with her cycle #3  PLAN: #1 overall patient is doing well with her chemotherapy. Her CBC looks great her white count today is 6.2.   #2 overall patient is tolerating her chemotherapy extremely well. Her counts looked terrific today.  #3 once patient is close to completing her chemotherapy I will plan on sending her back to Dr. Claud Gutierrez for discussion of surgery.  All questions were answered. The patient  knows to call the clinic with any problems, questions or concerns. We can certainly see the patient much sooner if necessary.  I spent 25 minutes counseling the patient and coordinating care with a 30 minute appointment.    Drue Second, MD Medical/Oncology [facility] [phone] (beeper) [phone] (Office)  01/22/2012, 12:44 AM

## 2012-01-25 ENCOUNTER — Encounter: Payer: Self-pay | Admitting: Oncology

## 2012-01-25 NOTE — Progress Notes (Signed)
Faxed disability form to [employer] @ [phone]; put original in registration desk.

## 2012-01-27 ENCOUNTER — Ambulatory Visit: Payer: Medicare Other | Admitting: Oncology

## 2012-01-27 ENCOUNTER — Other Ambulatory Visit: Payer: Medicare Other | Admitting: Lab

## 2012-01-27 ENCOUNTER — Telehealth (INDEPENDENT_AMBULATORY_CARE_PROVIDER_SITE_OTHER): Payer: Self-pay

## 2012-01-27 ENCOUNTER — Ambulatory Visit: Payer: Medicare Other

## 2012-01-27 NOTE — Telephone Encounter (Signed)
Received request for hydrocodone refill. Last refill was 11-26-12. I called pt to discuss need for narcotic pain med. Pt states she uses it for joint pain. I advised pt she will need to refer this request to her PCP or Dr Welton Flakes. Pt states she will contact Dr Milta Deiters office.

## 2012-01-28 ENCOUNTER — Ambulatory Visit: Payer: Medicare Other

## 2012-02-03 ENCOUNTER — Ambulatory Visit (HOSPITAL_BASED_OUTPATIENT_CLINIC_OR_DEPARTMENT_OTHER): Payer: Medicare Other | Admitting: Oncology

## 2012-02-03 ENCOUNTER — Encounter: Payer: Self-pay | Admitting: Oncology

## 2012-02-03 ENCOUNTER — Other Ambulatory Visit (HOSPITAL_BASED_OUTPATIENT_CLINIC_OR_DEPARTMENT_OTHER): Payer: Medicare Other | Admitting: Lab

## 2012-02-03 ENCOUNTER — Ambulatory Visit (HOSPITAL_BASED_OUTPATIENT_CLINIC_OR_DEPARTMENT_OTHER): Payer: Medicare Other

## 2012-02-03 ENCOUNTER — Telehealth: Payer: Self-pay | Admitting: *Deleted

## 2012-02-03 VITALS — BP 128/70 | HR 102 | Temp 98.6°F | Ht 64.5 in | Wt 228.1 lb

## 2012-02-03 DIAGNOSIS — C50919 Malignant neoplasm of unspecified site of unspecified female breast: Secondary | ICD-10-CM

## 2012-02-03 DIAGNOSIS — C50911 Malignant neoplasm of unspecified site of right female breast: Secondary | ICD-10-CM

## 2012-02-03 DIAGNOSIS — Z09 Encounter for follow-up examination after completed treatment for conditions other than malignant neoplasm: Secondary | ICD-10-CM

## 2012-02-03 DIAGNOSIS — Z5111 Encounter for antineoplastic chemotherapy: Secondary | ICD-10-CM

## 2012-02-03 DIAGNOSIS — Z170 Estrogen receptor positive status [ER+]: Secondary | ICD-10-CM

## 2012-02-03 LAB — COMPREHENSIVE METABOLIC PANEL
AST: 15 U/L (ref 0–37)
BUN: 25 mg/dL — ABNORMAL HIGH (ref 6–23)
Calcium: 9.9 mg/dL (ref 8.4–10.5)
Chloride: 99 mEq/L (ref 96–112)
Creatinine, Ser: 1 mg/dL (ref 0.50–1.10)
Total Bilirubin: 0.3 mg/dL (ref 0.3–1.2)

## 2012-02-03 LAB — CBC WITH DIFFERENTIAL/PLATELET
BASO%: 0.1 % (ref 0.0–2.0)
Basophils Absolute: 0 10*3/uL (ref 0.0–0.1)
EOS%: 0 % (ref 0.0–7.0)
Eosinophils Absolute: 0 10*3/uL (ref 0.0–0.5)
HCT: 32.3 % — ABNORMAL LOW (ref 34.8–46.6)
HGB: 11.3 g/dL — ABNORMAL LOW (ref 11.6–15.9)
LYMPH%: 5 % — ABNORMAL LOW (ref 14.0–49.7)
MCH: 29.5 pg (ref 25.1–34.0)
MCHC: 35 g/dL (ref 31.5–36.0)
MCV: 84.3 fL (ref 79.5–101.0)
MONO#: 0.8 10*3/uL (ref 0.1–0.9)
MONO%: 3.9 % (ref 0.0–14.0)
NEUT#: 17.4 10*3/uL — ABNORMAL HIGH (ref 1.5–6.5)
NEUT%: 91 % — ABNORMAL HIGH (ref 38.4–76.8)
Platelets: 335 10*3/uL (ref 145–400)
RBC: 3.83 10*6/uL (ref 3.70–5.45)
RDW: 17.2 % — ABNORMAL HIGH (ref 11.2–14.5)
WBC: 19.1 10*3/uL — ABNORMAL HIGH (ref 3.9–10.3)
lymph#: 1 10*3/uL (ref 0.9–3.3)

## 2012-02-03 MED ORDER — SODIUM CHLORIDE 0.9 % IV SOLN: Freq: Once | INTRAVENOUS | Status: DC

## 2012-02-03 MED ORDER — SODIUM CHLORIDE 0.9 % IV SOLN
600.0000 mg/m2 | Freq: Once | INTRAVENOUS | Status: AC
  Administered 2012-02-03: 1340 mg via INTRAVENOUS
  Filled 2012-02-03: qty 67

## 2012-02-03 MED ORDER — HEPARIN SOD (PORK) LOCK FLUSH 100 UNIT/ML IV SOLN
500.0000 [IU] | Freq: Once | INTRAVENOUS | Status: AC | PRN
  Administered 2012-02-03: 500 [IU]
  Filled 2012-02-03: qty 5

## 2012-02-03 MED ORDER — DEXAMETHASONE SODIUM PHOSPHATE 4 MG/ML IJ SOLN
20.0000 mg | Freq: Once | INTRAMUSCULAR | Status: AC
  Administered 2012-02-03: 20 mg via INTRAVENOUS

## 2012-02-03 MED ORDER — ONDANSETRON 16 MG/50ML IVPB (CHCC)
16.0000 mg | Freq: Once | INTRAVENOUS | Status: AC
Start: 2012-02-03 — End: 2012-02-03
  Administered 2012-02-03: 16 mg via INTRAVENOUS

## 2012-02-03 MED ORDER — DOCETAXEL CHEMO INJECTION 160 MG/16ML
75.0000 mg/m2 | Freq: Once | INTRAVENOUS | Status: AC
  Administered 2012-02-03: 170 mg via INTRAVENOUS
  Filled 2012-02-03: qty 17

## 2012-02-03 MED ORDER — SODIUM CHLORIDE 0.9 % IJ SOLN
10.0000 mL | INTRAMUSCULAR | Status: DC | PRN
  Administered 2012-02-03: 10 mL
  Filled 2012-02-03: qty 10

## 2012-02-03 NOTE — Telephone Encounter (Signed)
gave patient appointments for 01-2012 thru 02-2012 printed out calendar and gave to the patient

## 2012-02-03 NOTE — Progress Notes (Signed)
OFFICE PROGRESS NOTE  CC [name], MD [name], MD, MD [address] [name]. [facility] [place] [address]  DIAGNOSIS: 71 year old female with invasive ductal carcinoma of the right breast involving the right nipple diagnosed November 2012   PRIOR THERAPY:   #1 status post ultrasound-guided core biopsy of the mass at the 11:00 position 3 cm from the right nipple. The pathology revealed invasive mammary carcinoma with carcinoma in situ grade 2 tumor was estrogen receptor +100% progesterone receptor +3% proliferation marker 38% HER-2/neu negative.  #2 MRI of the breast showed 2.5 x 2.4 x 1.8 cm spiculated enhancing mass in the anterior aspect of the upper outer quadrant of the right breast with smaller linear and nodular enhancing masses extending to the biopsied mass to the right nipple.  #3 patient is status post punch biopsy of the right nipple with the pathology revealing an invasive mammary carcinoma as well.  CURRENT THERAPY: Patient is on neoadjuvant chemotherapy consisting of Taxotere and Cytoxan. She is here for cycle #4/6   INTERVAL HISTORY: [name] 71 y.o. female returns for followup visit today. Overall she is doing well she tolerated cycle 3 of her chemotherapy well without any significant complaints. Today she denies any fevers chills night sweats headaches shortness of breath chest pains palpitations no difficulty in swallowing her porta site cath looks great she has no nausea or vomiting no hematuria hematochezia melena hemoptysis hematemesis no easy bruising she has not noticed any skin changes. Remainder of the 10 point review of systems is negative.  MEDICAL HISTORY: Past Medical History  Diagnosis Date  . GERD (gastroesophageal reflux disease)   . Asthma     daily and prn inhalers  . Sleep apnea     uses CPAP occ. - to bring machine DOS  . Grave's disease     no current meds.  . Arthritis     osteo - s/p right total knee  .  Spondylosis     cervical and lumbar  . Hyperlipidemia   . Breast cancer   . Diabetes mellitus     diet-controlled    ALLERGIES:  is allergic to zocor.  MEDICATIONS:  Current Outpatient Prescriptions  Medication Sig Dispense Refill  . albuterol (PROVENTIL HFA;VENTOLIN HFA) 108 (90 BASE) MCG/ACT inhaler Inhale 2 puffs into the lungs as needed.        Marland Kitchen aspirin 81 MG tablet Take 81 mg by mouth daily.       . cyclobenzaprine (FLEXERIL) 10 MG tablet Take 5 mg by mouth 3 (three) times daily as needed. For muscle spasms      . diltiazem (CARDIZEM CD) 240 MG 24 hr capsule Take 240 mg by mouth daily. AM      . ezetimibe (ZETIA) 10 MG tablet Take 10 mg by mouth daily. AM      . Fluticasone-Salmeterol (ADVAIR) 100-50 MCG/DOSE AEPB Inhale 1 puff into the lungs every 12 (twelve) hours.       . lansoprazole (PREVACID) 30 MG capsule Take 30 mg by mouth 2 (two) times daily.       Marland Kitchen lidocaine-prilocaine (EMLA) cream Apply topically as needed.  30 g  4  . meloxicam (MOBIC) 7.5 MG tablet Take 5 mg by mouth daily as needed. For knee tightness      . ondansetron (ZOFRAN) 8 MG tablet Take by mouth every 8 (eight) hours as needed.        . prochlorperazine (COMPAZINE) 10 MG tablet Take 10 mg by  mouth every 6 (six) hours as needed.        . rosuvastatin (CRESTOR) 5 MG tablet Take 5 mg by mouth daily. AM      . triamterene-hydrochlorothiazide (MAXZIDE-25) 37.5-25 MG per tablet Take 1 tablet by mouth daily. AM        SURGICAL HISTORY:  Past Surgical History  Procedure Date  . Lumbar laminectomy     back surg. x 2 - 1980's and 1999  . Total knee arthroplasty 07/28/2006    right  . Cataract extraction, bilateral   . Bilateral oophorectomy 2009  . Partial hysterectomy   . Cardiac catheterization 04/10/2010, 05/07/2004    LAD lesion, blood flow OK, per pt.  . Portacath placement 11/27/2011    Procedure: INSERTION PORT-A-CATH;  Surgeon: Ernestene Mention, MD;  Location: [facility];  Service:  General;  Laterality: Right;  . Lesion removal 11/27/2011    Procedure: LESION REMOVAL;  Surgeon: Ernestene Mention, MD;  [facility];  Service: General;  Laterality: Right;  Skin tag removed from under right eye. No specimen  . Abdominal hysterectomy         ECOG PERFORMANCE STATUS: 0 - Asymptomatic  Blood pressure 128/70, pulse 102, temperature 98.6 F (37 C), temperature source Oral, height 5' 4.5" (1.638 m), weight 228 lb 1.6 oz (103.465 kg). Physical examination:  EOMI PERRLA sclerae anicteric no conjunctival pallor oral mucosa is moist neck is supple lungs are clear bilaterally to auscultation and percussion cardiovascular is regular rate rhythm no murmurs gallops or rubs abdomen is soft nontender nondistended bowel sounds are present no hepatosplenomegaly extremities no clubbing edema or cyanosis neuro patient's alert oriented x3 strength is symmetrical in upper and lower extremities.  Breast examination left breast no masses nipple discharge right breast no masses nipple discharge no skin changes or  LABORATORY DATA: Lab Results  Component Value Date   WBC 19.1* 02/03/2012   HGB 11.3* 02/03/2012   HCT 32.3* 02/03/2012   MCV 84.3 02/03/2012   PLT 335 02/03/2012      Chemistry      Component Value Date/Time   NA 137 01/20/2012 1443   K 3.2* 01/20/2012 1443   CL 96 01/20/2012 1443   CO2 32 01/20/2012 1443   BUN 19 01/20/2012 1443   CREATININE 1.07 01/20/2012 1443      Component Value Date/Time   CALCIUM 9.5 01/20/2012 1443   ALKPHOS 87 12/11/2011 1009   AST 20 12/11/2011 1009   ALT 21 12/11/2011 1009   BILITOT 0.3 12/11/2011 1009       RADIOGRAPHIC STUDIES:  No results found.  ASSESSMENT: 71 year old female with 1.  7.5 cm mass in the right breast with positive skin biopsy at the nipple of the right breast. Patient's tumor is ER positive PR positive HER-2/neu negative.   2. She is now receiving neoadjuvant chemotherapy she has completed a total  of 3 cycles. She is s/p cycle #4.  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 She will receive Neulasta with her cycle #4  PLAN: #1 overall patient is doing well with her chemotherapy. Proceed with cycle #4 of scheduled  treatemnt  #2 overall patient is tolerating her chemotherapy extremely well. Her counts looked terrific today.  #3 once patient is close to completing her chemotherapy I will plan on sending her back to Dr. [name] for discussion of surgery.  #4 She will need a surgical planning MRI of the breasts, I have ordered it for  02/29/12, POF to schedulers  All questions were answered. The patient knows to call the clinic with any problems, questions or concerns. We can certainly see the patient much sooner if necessary.  I spent 25 minutes counseling the patient and coordinating care with a 30 minute appointment.    Drue Second, MD Medical/Oncology [facility] [phone] (beeper) [phone] (Office)  02/03/2012, 11:04 AM

## 2012-02-04 ENCOUNTER — Telehealth: Payer: Self-pay | Admitting: Oncology

## 2012-02-04 ENCOUNTER — Ambulatory Visit (HOSPITAL_BASED_OUTPATIENT_CLINIC_OR_DEPARTMENT_OTHER): Payer: Medicare Other

## 2012-02-04 ENCOUNTER — Telehealth: Payer: Self-pay | Admitting: *Deleted

## 2012-02-04 VITALS — BP 128/77 | HR 74 | Temp 98.3°F

## 2012-02-04 DIAGNOSIS — C50919 Malignant neoplasm of unspecified site of unspecified female breast: Secondary | ICD-10-CM

## 2012-02-04 DIAGNOSIS — Z5189 Encounter for other specified aftercare: Secondary | ICD-10-CM

## 2012-02-04 DIAGNOSIS — C50911 Malignant neoplasm of unspecified site of right female breast: Secondary | ICD-10-CM

## 2012-02-04 MED ORDER — PEGFILGRASTIM INJECTION 6 MG/0.6ML
6.0000 mg | Freq: Once | SUBCUTANEOUS | Status: AC
  Administered 2012-02-04: 6 mg via SUBCUTANEOUS
  Filled 2012-02-04: qty 0.6

## 2012-02-04 NOTE — Telephone Encounter (Signed)
Pt called left message regarding " someone called me from this number." Called pt, reviewed last entries, spoke with scheduling -pt was called and per last note entered Regarding her appt with Dr. Derrell Lolling. Pt verbalized she was already aware of this appt and thanked [place] for the call. Pt denied needing further asistance

## 2012-02-04 NOTE — Telephone Encounter (Signed)
S/w blanca from [facility] regarding the pt's appt with dr Derrell Lolling for march per refferal

## 2012-02-04 NOTE — Telephone Encounter (Signed)
lmonvm adviisng the pt of her appt on 03/03/2012 with dr Derrell Lolling

## 2012-02-10 ENCOUNTER — Encounter: Payer: Self-pay | Admitting: Oncology

## 2012-02-10 ENCOUNTER — Ambulatory Visit: Payer: Medicare Other

## 2012-02-10 ENCOUNTER — Ambulatory Visit (HOSPITAL_BASED_OUTPATIENT_CLINIC_OR_DEPARTMENT_OTHER): Payer: Medicare Other | Admitting: Oncology

## 2012-02-10 ENCOUNTER — Other Ambulatory Visit (HOSPITAL_BASED_OUTPATIENT_CLINIC_OR_DEPARTMENT_OTHER): Payer: Medicare Other | Admitting: Lab

## 2012-02-10 VITALS — BP 121/69 | HR 102 | Temp 99.3°F | Ht 64.5 in | Wt 225.3 lb

## 2012-02-10 DIAGNOSIS — C50911 Malignant neoplasm of unspecified site of right female breast: Secondary | ICD-10-CM

## 2012-02-10 DIAGNOSIS — C50919 Malignant neoplasm of unspecified site of unspecified female breast: Secondary | ICD-10-CM

## 2012-02-10 DIAGNOSIS — Z09 Encounter for follow-up examination after completed treatment for conditions other than malignant neoplasm: Secondary | ICD-10-CM

## 2012-02-10 DIAGNOSIS — Z170 Estrogen receptor positive status [ER+]: Secondary | ICD-10-CM

## 2012-02-10 LAB — CBC WITH DIFFERENTIAL/PLATELET
BASO%: 1.2 % (ref 0.0–2.0)
EOS%: 1.8 % (ref 0.0–7.0)
MCH: 29.2 pg (ref 25.1–34.0)
MCHC: 34.2 g/dL (ref 31.5–36.0)
MONO#: 1.2 10*3/uL — ABNORMAL HIGH (ref 0.1–0.9)
RBC: 3.73 10*6/uL (ref 3.70–5.45)
WBC: 4.9 10*3/uL (ref 3.9–10.3)
lymph#: 1.3 10*3/uL (ref 0.9–3.3)
nRBC: 5 % — ABNORMAL HIGH (ref 0–0)

## 2012-02-10 LAB — COMPREHENSIVE METABOLIC PANEL
ALT: 15 U/L (ref 0–35)
AST: 14 U/L (ref 0–37)
Alkaline Phosphatase: 72 U/L (ref 39–117)
Calcium: 9.2 mg/dL (ref 8.4–10.5)
Chloride: 99 mEq/L (ref 96–112)
Creatinine, Ser: 0.78 mg/dL (ref 0.50–1.10)
Potassium: 3.5 mEq/L (ref 3.5–5.3)

## 2012-02-10 NOTE — Patient Instructions (Signed)
Lab Results  Component Value Date   WBC 4.9 02/10/2012   HGB 10.9* 02/10/2012   HCT 31.9* 02/10/2012   MCV 85.5 02/10/2012   PLT 185 02/10/2012   1. You are doing well with the chemotherapy. Counts look great today.  2. You will be back on 02/24/12 for cycle 5 of your chemotherapy  3. Use over the counter artifical tear drops for the eyes as needed, if vision worsens call  3. Call with any problems to the cancer center at [phone] and ask for triage nurse or Dr. Rosine Beat nurses [facility] or [place])

## 2012-02-10 NOTE — Progress Notes (Signed)
OFFICE PROGRESS NOTE  CC Jasmine Kelp, MD [name], MD, MD [address] [name]. [facility], [place] [place] [address]  DIAGNOSIS: 71 year old female with invasive ductal carcinoma of the right breast involving the right nipple diagnosed November 2012   PRIOR THERAPY:   #1 status post ultrasound-guided core biopsy of the mass at the 11:00 position 3 cm from the right nipple. The pathology revealed invasive mammary carcinoma with carcinoma in situ grade 2 tumor was estrogen receptor +100% progesterone receptor +3% proliferation marker 38% HER-2/neu negative.  #2 MRI of the breast showed 2.5 x 2.4 x 1.8 cm spiculated enhancing mass in the anterior aspect of the upper outer quadrant of the right breast with smaller linear and nodular enhancing masses extending to the biopsied mass to the right nipple.  #3 patient is status post punch biopsy of the right nipple with the pathology revealing an invasive mammary carcinoma as well.  CURRENT THERAPY: Patient is on neoadjuvant chemotherapy consisting of Taxotere and Cytoxan. Status Post cycle #4/6  INTERVAL HISTORY: [name] 71 y.o. female returns for followup visit today. Overall she is doing well she tolerated cycle 4 of her chemotherapy well without any significant complaints. Today she denies any fevers chills night sweats headaches shortness of breath chest pains palpitations no difficulty in swallowing her porta site cath looks great she has no nausea or vomiting no hematuria hematochezia melena hemoptysis hematemesis no easy bruising she has not noticed any skin changes. Has some blurring of vision, complain of dry eyes. Still having some tingling in the fingertips but no numbness or any worsening in the pain of the fingertips. No headaches or dizziness, no bruising or bleeding. Remainder of the 10 point review of systems is negative.  MEDICAL HISTORY: Past Medical History  Diagnosis Date  . GERD (gastroesophageal reflux  disease)   . Asthma     daily and prn inhalers  . Sleep apnea     uses CPAP occ. - to bring machine DOS  . Grave's disease     no current meds.  . Arthritis     osteo - s/p right total knee  . Spondylosis     cervical and lumbar  . Hyperlipidemia   . Breast cancer   . Diabetes mellitus     diet-controlled    ALLERGIES:  is allergic to zocor.  MEDICATIONS:  Current Outpatient Prescriptions  Medication Sig Dispense Refill  . albuterol (PROVENTIL HFA;VENTOLIN HFA) 108 (90 BASE) MCG/ACT inhaler Inhale 2 puffs into the lungs as needed.        Marland Kitchen aspirin 81 MG tablet Take 81 mg by mouth daily.       . cyclobenzaprine (FLEXERIL) 10 MG tablet Take 5 mg by mouth 3 (three) times daily as needed. For muscle spasms      . diltiazem (CARDIZEM CD) 240 MG 24 hr capsule Take 240 mg by mouth daily. AM      . ezetimibe (ZETIA) 10 MG tablet Take 10 mg by mouth daily. AM      . Fluticasone-Salmeterol (ADVAIR) 100-50 MCG/DOSE AEPB Inhale 1 puff into the lungs every 12 (twelve) hours.       . lansoprazole (PREVACID) 30 MG capsule Take 30 mg by mouth 2 (two) times daily.       Marland Kitchen lidocaine-prilocaine (EMLA) cream Apply topically as needed.  30 g  4  . meloxicam (MOBIC) 7.5 MG tablet Take 5 mg by mouth daily as needed. For knee tightness      .  ondansetron (ZOFRAN) 8 MG tablet Take by mouth every 8 (eight) hours as needed.        . prochlorperazine (COMPAZINE) 10 MG tablet Take 10 mg by mouth every 6 (six) hours as needed.        . rosuvastatin (CRESTOR) 5 MG tablet Take 5 mg by mouth daily. AM      . triamterene-hydrochlorothiazide (MAXZIDE-25) 37.5-25 MG per tablet Take 1 tablet by mouth daily. AM        SURGICAL HISTORY:  Past Surgical History  Procedure Date  . Lumbar laminectomy     back surg. x 2 - 1980's and 1999  . Total knee arthroplasty 07/28/2006    right  . Cataract extraction, bilateral   . Bilateral oophorectomy 2009  . Partial hysterectomy   . Cardiac catheterization 04/10/2010,  05/07/2004    LAD lesion, blood flow OK, per pt.  . Portacath placement 11/27/2011    Procedure: INSERTION PORT-A-CATH;  Surgeon: Ernestene Mention, MD;  Location: [facility];  Service: General;  Laterality: Right;  . Lesion removal 11/27/2011    Procedure: LESION REMOVAL;  Surgeon: Ernestene Mention, MD;  Location: [facility];  Service: General;  Laterality: Right;  Skin tag removed from under right eye. No specimen  . Abdominal hysterectomy         ECOG PERFORMANCE STATUS: 0 - Asymptomatic  Blood pressure 121/69, pulse 102, temperature 99.3 F (37.4 C), temperature source Oral, height 5' 4.5" (1.638 m), weight 225 lb 4.8 oz (102.195 kg). Physical examination:   HEENT: EOMI PERRLA sclerae anicteric no conjunctival pallor oral mucosa is moist neck is supple NO palpable adenopathy LUNGS: are clear bilaterally to auscultation and percussion CVS: is regular rate rhythm no murmurs gallops or rubs GI: abdomen is soft nontender nondistended bowel sounds are present no hepatosplenomegaly  EXTREMITIES: extremities no clubbing edema or cyanosis CNS: neuro patient's alert oriented x3 strength is symmetrical in upper and lower extremities. Breast examination left breast no masses nipple discharge right breast no masses nipple discharge no skin changes   LABORATORY DATA: Lab Results  Component Value Date   WBC 4.9 02/10/2012   HGB 10.9* 02/10/2012   HCT 31.9* 02/10/2012   MCV 85.5 02/10/2012   PLT 185 02/10/2012      Chemistry      Component Value Date/Time   NA 140 02/03/2012 1016   K 3.5 02/03/2012 1016   CL 99 02/03/2012 1016   CO2 26 02/03/2012 1016   BUN 25* 02/03/2012 1016   CREATININE 1.00 02/03/2012 1016      Component Value Date/Time   CALCIUM 9.9 02/03/2012 1016   ALKPHOS 77 02/03/2012 1016   AST 15 02/03/2012 1016   ALT 16 02/03/2012 1016   BILITOT 0.3 02/03/2012 1016       RADIOGRAPHIC STUDIES:  No results found.  ASSESSMENT: 71 year old female  with 1.  7.5 cm mass in the right breast with positive skin biopsy at the nipple of the right breast. Patient's tumor is ER positive PR positive HER-2/neu negative.   2. She is now receiving neoadjuvant chemotherapy she has completed a total of 3 cycles. She is s/p cycle #4.  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   PLAN: #1 overall patient is doing well with her chemotherapy. Tolerated cycle 4 well, blood counts have held up well.  #2 overall patient is tolerating her chemotherapy extremely well. Her counts looked terrific today.  #3 once patient is close to completing  her chemotherapy I will plan on sending her back to Dr. Claud Gutierrez for discussion of surgery.  #4 She will need a surgical planning MRI of the breasts, I have ordered it for 02/29/12, POF to schedulers  #5. She will return on 02/24/12 and receive cycle 5 of TC.   All questions were answered. The patient knows to call the clinic with any problems, questions or concerns. We can certainly see the patient much sooner if necessary.  I spent 25 minutes counseling the patient and coordinating care with a 30 minute appointment.    Drue Second, MD Medical/Oncology [facility] [phone] (beeper) [phone] (Office)  02/10/2012, 1:16 PM

## 2012-02-11 ENCOUNTER — Ambulatory Visit: Payer: Medicare Other

## 2012-02-24 ENCOUNTER — Ambulatory Visit (HOSPITAL_BASED_OUTPATIENT_CLINIC_OR_DEPARTMENT_OTHER): Payer: Medicare Other

## 2012-02-24 ENCOUNTER — Ambulatory Visit: Payer: Medicare Other | Admitting: Oncology

## 2012-02-24 ENCOUNTER — Other Ambulatory Visit (HOSPITAL_BASED_OUTPATIENT_CLINIC_OR_DEPARTMENT_OTHER): Payer: Medicare Other | Admitting: Lab

## 2012-02-24 ENCOUNTER — Ambulatory Visit (HOSPITAL_BASED_OUTPATIENT_CLINIC_OR_DEPARTMENT_OTHER): Payer: Medicare Other | Admitting: Family

## 2012-02-24 VITALS — BP 125/68 | HR 100 | Temp 98.3°F | Ht 64.5 in | Wt 232.7 lb

## 2012-02-24 DIAGNOSIS — M2550 Pain in unspecified joint: Secondary | ICD-10-CM

## 2012-02-24 DIAGNOSIS — C50919 Malignant neoplasm of unspecified site of unspecified female breast: Secondary | ICD-10-CM

## 2012-02-24 DIAGNOSIS — Z09 Encounter for follow-up examination after completed treatment for conditions other than malignant neoplasm: Secondary | ICD-10-CM

## 2012-02-24 DIAGNOSIS — C50911 Malignant neoplasm of unspecified site of right female breast: Secondary | ICD-10-CM

## 2012-02-24 DIAGNOSIS — Z5111 Encounter for antineoplastic chemotherapy: Secondary | ICD-10-CM

## 2012-02-24 LAB — CBC WITH DIFFERENTIAL/PLATELET
BASO%: 0.1 % (ref 0.0–2.0)
Basophils Absolute: 0 10*3/uL (ref 0.0–0.1)
EOS%: 0 % (ref 0.0–7.0)
HCT: 30.6 % — ABNORMAL LOW (ref 34.8–46.6)
HGB: 10.4 g/dL — ABNORMAL LOW (ref 11.6–15.9)
LYMPH%: 3.8 % — ABNORMAL LOW (ref 14.0–49.7)
MCH: 29.3 pg (ref 25.1–34.0)
MCHC: 34 g/dL (ref 31.5–36.0)
MCV: 86.2 fL (ref 79.5–101.0)
MONO%: 3.2 % (ref 0.0–14.0)
NEUT%: 92.9 % — ABNORMAL HIGH (ref 38.4–76.8)
lymph#: 0.6 10*3/uL — ABNORMAL LOW (ref 0.9–3.3)

## 2012-02-24 LAB — COMPREHENSIVE METABOLIC PANEL
AST: 16 U/L (ref 0–37)
Albumin: 4.3 g/dL (ref 3.5–5.2)
Alkaline Phosphatase: 78 U/L (ref 39–117)
BUN: 21 mg/dL (ref 6–23)
Creatinine, Ser: 0.85 mg/dL (ref 0.50–1.10)
Glucose, Bld: 131 mg/dL — ABNORMAL HIGH (ref 70–99)
Potassium: 3.6 mEq/L (ref 3.5–5.3)
Total Bilirubin: 0.2 mg/dL — ABNORMAL LOW (ref 0.3–1.2)

## 2012-02-24 MED ORDER — TRAMADOL HCL 50 MG PO TABS: 50.0000 mg | ORAL_TABLET | Freq: Four times a day (QID) | ORAL | Status: AC | PRN

## 2012-02-24 MED ORDER — SODIUM CHLORIDE 0.9 % IJ SOLN
10.0000 mL | INTRAMUSCULAR | Status: DC | PRN
  Administered 2012-02-24: 10 mL
  Filled 2012-02-24: qty 10

## 2012-02-24 MED ORDER — HEPARIN SOD (PORK) LOCK FLUSH 100 UNIT/ML IV SOLN
500.0000 [IU] | Freq: Once | INTRAVENOUS | Status: AC | PRN
  Administered 2012-02-24: 500 [IU]
  Filled 2012-02-24: qty 5

## 2012-02-24 MED ORDER — DEXAMETHASONE SODIUM PHOSPHATE 4 MG/ML IJ SOLN
20.0000 mg | Freq: Once | INTRAMUSCULAR | Status: AC
  Administered 2012-02-24: 20 mg via INTRAVENOUS

## 2012-02-24 MED ORDER — SODIUM CHLORIDE 0.9 % IV SOLN
Freq: Once | INTRAVENOUS | Status: AC
  Administered 2012-02-24: 11:00:00 via INTRAVENOUS

## 2012-02-24 MED ORDER — ONDANSETRON 16 MG/50ML IVPB (CHCC)
16.0000 mg | Freq: Once | INTRAVENOUS | Status: AC
  Administered 2012-02-24: 16 mg via INTRAVENOUS

## 2012-02-24 MED ORDER — SODIUM CHLORIDE 0.9 % IV SOLN
600.0000 mg/m2 | Freq: Once | INTRAVENOUS | Status: AC
  Administered 2012-02-24: 1340 mg via INTRAVENOUS
  Filled 2012-02-24: qty 67

## 2012-02-24 MED ORDER — DOCETAXEL CHEMO INJECTION 160 MG/16ML
75.0000 mg/m2 | Freq: Once | INTRAVENOUS | Status: AC
  Administered 2012-02-24: 170 mg via INTRAVENOUS
  Filled 2012-02-24: qty 17

## 2012-02-24 NOTE — Patient Instructions (Signed)
Patient aware of next appointment; discharged home alone with no complaints.

## 2012-02-25 ENCOUNTER — Encounter: Payer: Self-pay | Admitting: Family

## 2012-02-25 ENCOUNTER — Ambulatory Visit (HOSPITAL_BASED_OUTPATIENT_CLINIC_OR_DEPARTMENT_OTHER): Payer: Medicare Other

## 2012-02-25 VITALS — BP 126/67 | HR 94 | Temp 97.6°F

## 2012-02-25 DIAGNOSIS — Z5189 Encounter for other specified aftercare: Secondary | ICD-10-CM

## 2012-02-25 DIAGNOSIS — C50911 Malignant neoplasm of unspecified site of right female breast: Secondary | ICD-10-CM

## 2012-02-25 DIAGNOSIS — C50919 Malignant neoplasm of unspecified site of unspecified female breast: Secondary | ICD-10-CM

## 2012-02-25 MED ORDER — PEGFILGRASTIM INJECTION 6 MG/0.6ML
6.0000 mg | Freq: Once | SUBCUTANEOUS | Status: AC
  Administered 2012-02-25: 6 mg via SUBCUTANEOUS
  Filled 2012-02-25: qty 0.6

## 2012-02-25 NOTE — Progress Notes (Signed)
OFFICE PROGRESS NOTE  CC [name], MD [name], MD, MD [address] [name]. [facility] [place] [address]  DIAGNOSIS: Invasive ductal carcinoma of the right breast involving the right nipple, diagnosed November 2012   PRIOR THERAPY:  1. Ultrasound-guided core biopsy, 11 o'clock position, 3 cm from the right nipple. Pathology revealed invasive mammary carcinoma with carcinoma in situ, grade 2, ER +100% PR +3%, proliferation marker 38% HER-2/neu negative. 2. MRI of the breast showed 2.5 x 2.4 x 1.8 cm spiculated enhancing mass in the anterior aspect of the upper outer quadrant of the right breast with smaller linear and nodular enhancing masses extending to the biopsied mass to the right nipple. 3. Punch biopsy of the right nipple with pathology revealing invasive mammary carcinoma.  CURRENT THERAPY: Neoadjuvant chemotherapy consisting of Taxotere and Cytoxan. This will be cycle 5 of 6 planned.   INTERVAL HISTORY: Overall doing well, tolerating chemotherapy well without any significant complaints. Denies fevers, chills, night sweats, headaches, shortness of breath, chest pains, palpitations. No difficulty swallowing. Port-a- site cath without signs or symptoms of infection. No nausea or vomiting, hematuria, hematochezia, melena, hemoptysis, hematemesis. No easy bruising or skin changes. Appetite waxes and wans, complains food "has no taste". Mild - moderate arthralgias/myalgias.  Slight  tingling in the fingertips, no numbness or worsening in the pain of the fingertips. Remainder of the 10 point review of systems is negative.  MEDICAL HISTORY: Past Medical History  Diagnosis Date  . GERD (gastroesophageal reflux disease)   . Asthma     daily and prn inhalers  . Sleep apnea     uses CPAP occ. - to bring machine DOS  . Grave's disease     no current meds.  . Arthritis     osteo - s/p right total knee  . Spondylosis     cervical and lumbar  . Hyperlipidemia    . Breast cancer   . Diabetes mellitus     diet-controlled    ALLERGIES:  is allergic to zocor.  SURGICAL HISTORY:  Past Surgical History  Procedure Date  . Lumbar laminectomy     back surg. x 2 - 1980's and 1999  . Total knee arthroplasty 07/28/2006    right  . Cataract extraction, bilateral   . Bilateral oophorectomy 2009  . Partial hysterectomy   . Cardiac catheterization 04/10/2010, 05/07/2004    LAD lesion, blood flow OK, per pt.  . Portacath placement 11/27/2011    Procedure: INSERTION PORT-A-CATH;  Surgeon: Ernestene Mention, MD;  Location: [facility];  Service: General;  Laterality: Right;  . Lesion removal 11/27/2011    Procedure: LESION REMOVAL;  Surgeon: Ernestene Mention, MD;  Location:  SUR[facility]ERY [facility];  Service: General;  Laterality: Right;  Skin tag removed from under right eye. No specimen  . Abdominal hysterectomy    ECOG PERFORMANCE STATUS: 0 - Asymptomatic  Blood pressure 125/68, pulse 100, temperature 98.3 F (36.8 C), temperature source Oral, height 5' 4.5" (1.638 m), weight 232 lb 11.2 oz (105.552 kg). Physical examination: General: Well developed, well nourished, in no acute distress.  EENT: No ocular or oral lesions. No stomatitis.  Respiratory: Lungs are clear to auscultation bilaterally with normal respiratory movement and no accessory muscle use. Cardiac: No murmur, rub or tachycardia. No upper or lower extremity edema.  GI: Abdomen is soft, no palpable hepatosplenomegaly. No fluid wave. No tenderness. Musculoskeletal: No kyphosis, no tenderness over the spine, ribs or hips. Lymph: No cervical,  infraclavicular, axillary or inguinal adenopathy. Neuro: No focal neurological deficits. Psych: Alert and oriented X 3, appropriate mood and affect.    LABORATORY DATA: Lab Results  Component Value Date   WBC 14.6* 02/24/2012   HGB 10.4* 02/24/2012   HCT 30.6* 02/24/2012   MCV 86.2 02/24/2012   PLT 295 02/24/2012      Chemistry        Component Value Date/Time   NA 139 02/24/2012 0957   K 3.6 02/24/2012 0957   CL 98 02/24/2012 0957   CO2 27 02/24/2012 0957   BUN 21 02/24/2012 0957   CREATININE 0.85 02/24/2012 0957      Component Value Date/Time   CALCIUM 9.8 02/24/2012 0957   ALKPHOS 78 02/24/2012 0957   AST 16 02/24/2012 0957   ALT 17 02/24/2012 0957   BILITOT 0.2* 02/24/2012 0957     ASSESSMENT: 71 year old female with 1.  Right breast cancer, receiving neoadjuvant chemotherapy, completed 4 cycles.  2. Arthralgias/myalgias. Has used Vicodin in the past.  3. Labs OK to treat.   PLAN: 1. Treatment today. Will return in 1 week for lab and appt with me. She will see Dr Welton Flakes prior to final treatment.  2. Surgical planning MRI of the breasts, 02/29/12. 3. Prescription for Ultram 50 mg every 6 hrs as needed for pain. RX is printed and given to pt. She has used this in the past with good results.    All questions were answered. The patient knows to call the clinic with any problems, questions or concerns. We can certainly see the patient much sooner if necessary.  I

## 2012-02-29 ENCOUNTER — Ambulatory Visit (HOSPITAL_COMMUNITY): Payer: Medicare Other

## 2012-02-29 ENCOUNTER — Other Ambulatory Visit: Payer: Self-pay | Admitting: Oncology

## 2012-02-29 ENCOUNTER — Ambulatory Visit
Admission: RE | Admit: 2012-02-29 | Discharge: 2012-02-29 | Disposition: A | Discharge status: Home / Self Care | Payer: Medicare Other | Source: Ambulatory Visit | Attending: Oncology | Admitting: Oncology

## 2012-02-29 ENCOUNTER — Ambulatory Visit (HOSPITAL_COMMUNITY)
Admission: RE | Admit: 2012-02-29 | Discharge: 2012-02-29 | Disposition: A | Discharge status: Home / Self Care | Payer: Medicare Other | Source: Ambulatory Visit | Attending: Oncology | Admitting: Oncology

## 2012-02-29 DIAGNOSIS — C50911 Malignant neoplasm of unspecified site of right female breast: Secondary | ICD-10-CM

## 2012-02-29 DIAGNOSIS — C50919 Malignant neoplasm of unspecified site of unspecified female breast: Secondary | ICD-10-CM

## 2012-02-29 MED ORDER — GADOBENATE DIMEGLUMINE 529 MG/ML IV SOLN
20.0000 mL | Freq: Once | INTRAVENOUS | Status: AC | PRN
  Administered 2012-02-29: 20 mL via INTRAVENOUS

## 2012-03-02 ENCOUNTER — Ambulatory Visit (HOSPITAL_BASED_OUTPATIENT_CLINIC_OR_DEPARTMENT_OTHER): Payer: Medicare Other | Admitting: Family

## 2012-03-02 ENCOUNTER — Encounter: Payer: Self-pay | Admitting: Family

## 2012-03-02 ENCOUNTER — Other Ambulatory Visit (HOSPITAL_BASED_OUTPATIENT_CLINIC_OR_DEPARTMENT_OTHER): Payer: Medicare Other | Admitting: Lab

## 2012-03-02 VITALS — BP 113/63 | HR 109 | Temp 98.8°F | Ht 64.5 in | Wt 222.5 lb

## 2012-03-02 DIAGNOSIS — C50919 Malignant neoplasm of unspecified site of unspecified female breast: Secondary | ICD-10-CM

## 2012-03-02 DIAGNOSIS — Z170 Estrogen receptor positive status [ER+]: Secondary | ICD-10-CM

## 2012-03-02 DIAGNOSIS — Z09 Encounter for follow-up examination after completed treatment for conditions other than malignant neoplasm: Secondary | ICD-10-CM

## 2012-03-02 DIAGNOSIS — D702 Other drug-induced agranulocytosis: Secondary | ICD-10-CM

## 2012-03-02 LAB — CBC WITH DIFFERENTIAL/PLATELET
BASO%: 1.1 % (ref 0.0–2.0)
HCT: 31.2 % — ABNORMAL LOW (ref 34.8–46.6)
MCHC: 34 g/dL (ref 31.5–36.0)
MONO#: 1 10*3/uL — ABNORMAL HIGH (ref 0.1–0.9)
NEUT#: 0.8 10*3/uL — ABNORMAL LOW (ref 1.5–6.5)
NEUT%: 30.7 % — ABNORMAL LOW (ref 38.4–76.8)
RBC: 3.58 10*6/uL — ABNORMAL LOW (ref 3.70–5.45)
WBC: 2.7 10*3/uL — ABNORMAL LOW (ref 3.9–10.3)
lymph#: 0.8 10*3/uL — ABNORMAL LOW (ref 0.9–3.3)
nRBC: 9 % — ABNORMAL HIGH (ref 0–0)

## 2012-03-02 LAB — COMPREHENSIVE METABOLIC PANEL
ALT: 15 U/L (ref 0–35)
AST: 12 U/L (ref 0–37)
Albumin: 4.1 g/dL (ref 3.5–5.2)
Calcium: 9.5 mg/dL (ref 8.4–10.5)
Chloride: 98 mEq/L (ref 96–112)
Potassium: 3.5 mEq/L (ref 3.5–5.3)
Total Protein: 6.3 g/dL (ref 6.0–8.3)

## 2012-03-02 MED ORDER — CIPROFLOXACIN HCL 500 MG PO TABS: 500.0000 mg | ORAL_TABLET | Freq: Two times a day (BID) | ORAL | Status: AC

## 2012-03-02 NOTE — Progress Notes (Signed)
OFFICE PROGRESS NOTE  CC [name], MD [name], MD, MD [address] [name]. [facility] [place] [address]  DIAGNOSIS: Invasive ductal carcinoma of the right breast involving the right nipple, diagnosed November 2012   PRIOR THERAPY:  1. Ultrasound-guided core biopsy, 11 o'clock position, 3 cm from the right nipple. Pathology revealed invasive mammary carcinoma with carcinoma in situ, grade 2, ER +100% PR +3%, proliferation marker 38% HER-2/neu negative. 2. MRI of the breast showed 2.5 x 2.4 x 1.8 cm spiculated enhancing mass in the anterior aspect of the upper outer quadrant of the right breast with smaller linear and nodular enhancing masses extending to the biopsied mass to the right nipple. 3. Punch biopsy of the right nipple with pathology revealing invasive mammary carcinoma.  CURRENT THERAPY: Neoadjuvant chemotherapy consisting of Taxotere and Cytoxan. This will be cycle 5 of 6 planned.   INTERVAL HISTORY: Overall doing well, tolerating chemotherapy well without any significant complaints. Denies fevers, chills, night sweats, headaches, shortness of breath, chest pains, palpitations. No difficulty swallowing. Port-a- site cath without signs or symptoms of infection. No nausea or vomiting, hematuria, hematochezia, melena, hemoptysis, hematemesis. No easy bruising or skin changes. Appetite waxes and wans, complains food "has no taste". Mild - moderate arthralgias/myalgias.  Slight  tingling in the fingertips, no numbness or worsening in the pain of the fingertips. Remainder of the 10 point review of systems is negative.  Has appt with Dr. Derrell Lolling 3/14 to plan surgery. MRI done in preparation for that visit showed good response to chemo with reduction in size of cancer.   Received Neulasta 3/7 after chemo.     MEDICAL HISTORY: Past Medical History  Diagnosis Date  . GERD (gastroesophageal reflux disease)   . Asthma     daily and prn inhalers  . Sleep  apnea     uses CPAP occ. - to bring machine DOS  . Grave's disease     no current meds.  . Arthritis     osteo - s/p right total knee  . Spondylosis     cervical and lumbar  . Hyperlipidemia   . Breast cancer   . Diabetes mellitus     diet-controlled    ALLERGIES:  is allergic to zocor.  SURGICAL HISTORY:  Past Surgical History  Procedure Date  . Lumbar laminectomy     back surg. x 2 - 1980's and 1999  . Total knee arthroplasty 07/28/2006    right  . Cataract extraction, bilateral   . Bilateral oophorectomy 2009  . Partial hysterectomy   . Cardiac catheterization 04/10/2010, 05/07/2004    LAD lesion, blood flow OK, per pt.  . Portacath placement 11/27/2011    Procedure: INSERTION PORT-A-CATH;  Surgeon: Ernestene Mention, MD;  Location: [facility];  Service: General;  Laterality: Right;  . Lesion removal 11/27/2011    Procedure: LESION REMOVAL;  Surgeon: Ernestene Mention, MD;  Location: [facility];  Service: General;  Laterality: Right;  Skin tag removed from under right eye. No specimen  . Abdominal hysterectomy    ECOG PERFORMANCE STATUS: 0 - Asymptomatic  Blood pressure 113/63, pulse 109, temperature 98.8 F (37.1 C), temperature source Oral, height 5' 4.5" (1.638 m), weight 222 lb 8 oz (100.925 kg). Physical examination: General: Well developed, well nourished, in no acute distress.  EENT: No ocular or oral lesions. No stomatitis.  Respiratory: Lungs are clear to auscultation bilaterally with normal respiratory movement and no accessory muscle use. Cardiac:  No murmur, rub or tachycardia. No upper or lower extremity edema.  GI: Abdomen is soft, no palpable hepatosplenomegaly. No fluid wave. No tenderness. Musculoskeletal: No kyphosis, no tenderness over the spine, ribs or hips. Lymph: No cervical, infraclavicular, axillary or inguinal adenopathy. Neuro: No focal neurological deficits. Psych: Alert and oriented X 3, appropriate mood and affect.      LABORATORY DATA: Lab Results  Component Value Date   WBC 2.7*  ANC 0.8 03/02/2012   HGB 10.6* 03/02/2012   HCT 31.2* 03/02/2012   MCV 87.2 03/02/2012   PLT 211 03/02/2012      Chemistry      Component Value Date/Time   NA 140 03/02/2012 1017   K 3.5 03/02/2012 1017   CL 98 03/02/2012 1017   CO2 31 03/02/2012 1017   BUN 12 03/02/2012 1017   CREATININE 0.89 03/02/2012 1017      Component Value Date/Time   CALCIUM 9.5 03/02/2012 1017   ALKPHOS 80 03/02/2012 1017   AST 12 03/02/2012 1017   ALT 15 03/02/2012 1017   BILITOT 0.4 03/02/2012 1017     ASSESSMENT: 71 year old female with 1.  Right breast cancer, receiving neoadjuvant chemotherapy,final cycle planned for 3/27.  2. Neutropenic, received Neulasta 3/7.  PLAN: 1. Cipro 500 mg bid X 7 days, prophylaxis for neutropenia. 2. Return 3/27 for appt with Dr. Welton Flakes with lab and final cycle of chemo.    All questions were answered. The patient knows to call the clinic with any problems, questions or concerns. We can certainly see the patient much sooner if necessary.  I

## 2012-03-03 ENCOUNTER — Encounter: Payer: Self-pay | Admitting: Family

## 2012-03-03 ENCOUNTER — Encounter (INDEPENDENT_AMBULATORY_CARE_PROVIDER_SITE_OTHER): Payer: Medicare Other | Admitting: General Surgery

## 2012-03-03 ENCOUNTER — Ambulatory Visit (INDEPENDENT_AMBULATORY_CARE_PROVIDER_SITE_OTHER): Payer: Medicare Other | Admitting: General Surgery

## 2012-03-03 ENCOUNTER — Encounter (INDEPENDENT_AMBULATORY_CARE_PROVIDER_SITE_OTHER): Payer: Self-pay | Admitting: General Surgery

## 2012-03-03 VITALS — BP 146/72 | HR 106 | Temp 97.6°F | Resp 18 | Ht 64.5 in | Wt 221.2 lb

## 2012-03-03 DIAGNOSIS — C50911 Malignant neoplasm of unspecified site of right female breast: Secondary | ICD-10-CM

## 2012-03-03 DIAGNOSIS — C50919 Malignant neoplasm of unspecified site of unspecified female breast: Secondary | ICD-10-CM

## 2012-03-03 NOTE — Progress Notes (Signed)
Subjective:     Patient ID: [name], female   DOB: 1941-10-15, 71 y.o.   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 She had a palpable mass in the right breast in the upper outer quadrant just outside the areolar margin. Image guided biopsy showed invasive mammary carcinoma, ER 100%, PR 3%, HER-2-negative, Ki-67 38%. She also had cancer involving the nipple and a skin punch biopsy was positive for cancer. Initial MRI showed a 2.5 x 2.4 x 1.8 cm mass in the upper-outer quadrant of the right breast.  She went on to have Port-A-Cath placement. There have been no problems with the port. She has received 5 of 6 total planned cycles of chemotherapy. The last cycle is scheduled for March 27. She receives Neulasta after each treatment.  Genetic testing has been done and reportedly this is negative.  Recent MRI on February 29, 2012 shows that the tumor is responding. There is no adenopathy. The tumor is 1.3 x 1.6 x 1.1 cm.  The patient is still motivated to have breast conservation surgery. Review of Systems 12 system review of systems is performed and is negative except as described above.    Objective:   Physical Exam The patient looks well. In no distress.  Neck no adenopathy no mass no jugular venous tension  Breast right breast is examined. The breasts are large. There is no palpable mass anywhere. The skin of the nipple  looked less thickened. No gross evidence of cancer by physical exam. No axillary adenopathy.    Assessment:     Invasive mammary carcinoma right breast, upper outer quadrant, with involvement of skin the nipple. Receptor positive, HER-2-negative, Ki-67 38%. Pretreatment clinical stage T4, N0;  IIIB  Satisfactory downstaging from neoadjuvant chemotherapy by physical exam and by MRI.  I think that she will be a candidate for a breast conservation procedure. This will require central  lumpectomy with removal of the nipple areolar complex and wire localization and sentinel node biopsy. That is her desire as well.  Negative genetic testing for genetic mutation.    Plan:     For now, I advised that she complete her sixth and final cycle of neoadjuvant chemotherapy which is scheduled for March 27.  I will request that her case be. discussed at breast conference tumor board next Wednesday.  Return to see me first week of April after she completes her chemotherapy. At that point in time I anticipate we will schedule for right partial mastectomy with needle localization procedure (Central lumpectomy) , sentinel node biopsy. We will leave her port in until Dr. Welton Flakes advises [place] that it may be removed, and the patient is aware of this.   I've discussed indications and details and risks of the surgery with her in great detail. She understands these issues. Her questions were answered. She agrees with the plan as outlined.   Angelia Mould. Derrell Lolling, M.D., [facility] General and Minimally invasive Surgery Breast and Colorectal Surgery Office:   [phone] Pager:   [phone]

## 2012-03-03 NOTE — Patient Instructions (Signed)
Your right breast cancer  is smaller, and appears to be definitely responding to the chemotherapy. For now, I would recommend taking the sixth and final treatment on March 27 as outlined.  Return to see Dr. Derrell Lolling the first week of April to discuss final plans.  We will ask that your case be discussed at breast cancer tumor board conference next week.  I think that you will be a good candidate for a partial mastectomy and sentinel node biopsy. He will lose your entire nipple and areola with this operation because of the presence of cancer in the skin of the nipple.

## 2012-03-09 ENCOUNTER — Telehealth (INDEPENDENT_AMBULATORY_CARE_PROVIDER_SITE_OTHER): Payer: Self-pay | Admitting: General Surgery

## 2012-03-09 ENCOUNTER — Other Ambulatory Visit (INDEPENDENT_AMBULATORY_CARE_PROVIDER_SITE_OTHER): Payer: Self-pay | Admitting: General Surgery

## 2012-03-09 DIAGNOSIS — C50911 Malignant neoplasm of unspecified site of right female breast: Secondary | ICD-10-CM

## 2012-03-09 NOTE — Telephone Encounter (Signed)
Discussed care plan with Dr. Welton Flakes and patient.  No more chemotherapy is indicated. Will cancel chemo for March 27 and will cancel office visit with me.  Will schedule right partial mastectomy with needle localization and sentinal node biopsy.

## 2012-03-10 ENCOUNTER — Other Ambulatory Visit (INDEPENDENT_AMBULATORY_CARE_PROVIDER_SITE_OTHER): Payer: Self-pay | Admitting: General Surgery

## 2012-03-10 DIAGNOSIS — C50911 Malignant neoplasm of unspecified site of right female breast: Secondary | ICD-10-CM

## 2012-03-16 ENCOUNTER — Telehealth: Payer: Self-pay | Admitting: *Deleted

## 2012-03-16 ENCOUNTER — Encounter: Payer: Self-pay | Admitting: Oncology

## 2012-03-16 ENCOUNTER — Ambulatory Visit: Payer: Medicare Other

## 2012-03-16 ENCOUNTER — Other Ambulatory Visit (HOSPITAL_BASED_OUTPATIENT_CLINIC_OR_DEPARTMENT_OTHER): Payer: Medicare Other | Admitting: Lab

## 2012-03-16 ENCOUNTER — Ambulatory Visit (HOSPITAL_BASED_OUTPATIENT_CLINIC_OR_DEPARTMENT_OTHER): Payer: Medicare Other | Admitting: Oncology

## 2012-03-16 VITALS — BP 133/69 | HR 109 | Temp 97.6°F | Ht 64.5 in | Wt 224.9 lb

## 2012-03-16 DIAGNOSIS — C50911 Malignant neoplasm of unspecified site of right female breast: Secondary | ICD-10-CM

## 2012-03-16 DIAGNOSIS — C50919 Malignant neoplasm of unspecified site of unspecified female breast: Secondary | ICD-10-CM

## 2012-03-16 DIAGNOSIS — Z09 Encounter for follow-up examination after completed treatment for conditions other than malignant neoplasm: Secondary | ICD-10-CM

## 2012-03-16 LAB — CBC WITH DIFFERENTIAL/PLATELET
BASO%: 0.4 % (ref 0.0–2.0)
EOS%: 0.3 % (ref 0.0–7.0)
LYMPH%: 13.8 % — ABNORMAL LOW (ref 14.0–49.7)
MCH: 30.4 pg (ref 25.1–34.0)
MCHC: 33.2 g/dL (ref 31.5–36.0)
MONO#: 0.6 10*3/uL (ref 0.1–0.9)
MONO%: 9.7 % (ref 0.0–14.0)
NEUT%: 75.8 % (ref 38.4–76.8)
Platelets: 333 10*3/uL (ref 145–400)
RBC: 3.48 10*6/uL — ABNORMAL LOW (ref 3.70–5.45)
WBC: 6.6 10*3/uL (ref 3.9–10.3)

## 2012-03-16 LAB — COMPREHENSIVE METABOLIC PANEL
ALT: 11 U/L (ref 0–35)
AST: 15 U/L (ref 0–37)
CO2: 29 mEq/L (ref 19–32)
Chloride: 103 mEq/L (ref 96–112)
Sodium: 139 mEq/L (ref 135–145)
Total Bilirubin: 0.3 mg/dL (ref 0.3–1.2)
Total Protein: 6.2 g/dL (ref 6.0–8.3)

## 2012-03-16 NOTE — Patient Instructions (Addendum)
1. You will proceed with your surgery scheduled on 04/01/12 with Dr. Derrell Lolling  2. About 1 - 2 weeks after the surgery I will see you back to discuss the pathology.  3. You will be referred back to Dr. Roselind Messier after your surgery as well for radiation re-consultation  4. You will begin anti-estrogen therapy when radiation therapy is completed.  5. The anti-estrogen being considered are:    1. Tamoxifin  2. Letrozole  The information for these drugs are as below:  Tamoxifen oral tablet What is this medicine? TAMOXIFEN (ta MOX i fen) blocks the effects of estrogen. It is commonly used to treat breast cancer. It is also used to decrease the chance of breast cancer coming back in women who have received treatment for the disease. It may also help prevent breast cancer in women who have a high risk of developing breast cancer. This medicine may be used for other purposes; ask your health care provider or pharmacist if you have questions. What should I tell my health care provider before I take this medicine? They need to know if you have any of these conditions: -blood clots -blood disease -cataracts or impaired eyesight -endometriosis -high calcium levels -high cholesterol -irregular menstrual cycles -liver disease -stroke -uterine fibroids -an unusual or allergic reaction to tamoxifen, other medicines, foods, dyes, or preservatives -pregnant or trying to get pregnant -breast-feeding How should I use this medicine? Take this medicine by mouth with a glass of water. Follow the directions on the prescription label. You can take it with or without food. Take your medicine at regular intervals. Do not take your medicine more often than directed. Do not stop taking except on your doctor's advice. A special MedGuide will be given to you by the pharmacist with each prescription and refill. Be sure to read this information carefully each time. Talk to your pediatrician regarding the use of this  medicine in children. While this drug may be prescribed for selected conditions, precautions do apply. Overdosage: If you think you have taken too much of this medicine contact a poison control center or emergency room at once. NOTE: This medicine is only for you. Do not share this medicine with others. What if I miss a dose? If you miss a dose, take it as soon as you can. If it is almost time for your next dose, take only that dose. Do not take double or extra doses. What may interact with this medicine? -aminoglutethimide -bromocriptine -chemotherapy drugs -female hormones, like estrogens and birth control pills -letrozole -medroxyprogesterone -phenobarbital -rifampin -warfarin This list may not describe all possible interactions. Give your health care provider a list of all the medicines, herbs, non-prescription drugs, or dietary supplements you use. Also tell them if you smoke, drink alcohol, or use illegal drugs. Some items may interact with your medicine. What should I watch for while using this medicine? Visit your doctor or health care professional for regular checks on your progress. You will need regular pelvic exams, breast exams, and mammograms. If you are taking this medicine to reduce your risk of getting breast cancer, you should know that this medicine does not prevent all types of breast cancer. If breast cancer or other problems occur, there is no guarantee that it will be found at an early stage. Do not become pregnant while taking this medicine or for 2 months after stopping this medicine. Stop taking this medicine if you get pregnant or think you are pregnant and contact your doctor. This medicine  may harm your unborn baby. Women who can possibly become pregnant should use birth control methods that do not use hormones during tamoxifen treatment and for 2 months after therapy has stopped. Talk with your health care provider for birth control advice. Do not breast feed while  taking this medicine. What side effects may I notice from receiving this medicine? Side effects that you should report to your doctor or health care professional as soon as possible: -changes in vision (blurred vision) -changes in your menstrual cycle -difficulty breathing or shortness of breath -difficulty walking or talking -new breast lumps -numbness -pelvic pain or pressure -redness, blistering, peeling or loosening of the skin, including inside the mouth -skin rash or itching (hives) -sudden chest pain -swelling of lips, face, or tongue -swelling, pain or tenderness in your calf or leg -unusual bruising or bleeding -vaginal discharge that is bloody, brown, or rust -weakness -yellowing of the whites of the eyes or skin Side effects that usually do not require medical attention (report to your doctor or health care professional if they continue or are bothersome): -fatigue -hair loss, although uncommon and is usually mild -headache -hot flashes -impotence (in men) -nausea, vomiting (mild) -vaginal discharge (white or clear) This list may not describe all possible side effects. Call your doctor for medical advice about side effects. You may report side effects to FDA at 1-800-FDA-1088. Where should I keep my medicine? Keep out of the reach of children. Store at room temperature between 20 and 25 degrees C (68 and 77 degrees F). Protect from light. Keep container tightly closed. Throw away any unused medicine after the expiration date. NOTE: This sheet is a summary. It may not cover all possible information. If you have questions about this medicine, talk to your doctor, pharmacist, or health care provider.  2012, Elsevier/Gold Standard. (08/23/2008 12:01:56 PM)  Letrozole tablets What is this medicine? LETROZOLE (LET roe zole) blocks the production of estrogen. Certain types of breast cancer grow under the influence of estrogen. Letrozole helps block tumor growth. This medicine  is used to treat advanced breast cancer in postmenopausal women. This medicine may be used for other purposes; ask your health care provider or pharmacist if you have questions. What should I tell my health care provider before I take this medicine? They need to know if you have any of these conditions: -liver disease -osteoporosis (weak bones) -an unusual or allergic reaction to letrozole, other medicines, foods, dyes, or preservatives -pregnant or trying to get pregnant -breast-feeding How should I use this medicine? Take this medicine by mouth with a glass of water. You may take it with or without food. Follow the directions on the prescription label. Take your medicine at regular intervals. Do not take your medicine more often than directed. Do not stop taking except on your doctor's advice. Talk to your pediatrician regarding the use of this medicine in children. Special care may be needed. Overdosage: If you think you have taken too much of this medicine contact a poison control center or emergency room at once. NOTE: This medicine is only for you. Do not share this medicine with others. What if I miss a dose? If you miss a dose, take it as soon as you can. If it is almost time for your next dose, take only that dose. Do not take double or extra doses. What may interact with this medicine? Do not take this medicine with any of the following medications: -estrogens, like hormone replacement therapy or birth control  pills This medicine may also interact with the following medications: -dietary supplements such as androstenedione or DHEA -prasterone -tamoxifen This list may not describe all possible interactions. Give your health care provider a list of all the medicines, herbs, non-prescription drugs, or dietary supplements you use. Also tell them if you smoke, drink alcohol, or use illegal drugs. Some items may interact with your medicine. What should I watch for while using this  medicine? Visit your doctor or health care professional for regular check-ups to monitor your condition. Do not use this drug if you are pregnant. Serious side effects to an unborn child are possible. Talk to your doctor or pharmacist for more information. You may get drowsy or dizzy. Do not drive, use machinery, or do anything that needs mental alertness until you know how this medicine affects you. Do not stand or sit up quickly, especially if you are an older patient. This reduces the risk of dizzy or fainting spells. What side effects may I notice from receiving this medicine? Side effects that you should report to your doctor or health care professional as soon as possible: -allergic reactions like skin rash, itching, or hives -bone fracture -chest pain -difficulty breathing or shortness of breath -severe pain, swelling, warmth in the leg -unusually weak or tired -vaginal bleeding Side effects that usually do not require medical attention (report to your doctor or health care professional if they continue or are bothersome): -bone, back, joint, or muscle pain -dizziness -fatigue -fluid retention -headache -hot flashes, night sweats -nausea -weight gain This list may not describe all possible side effects. Call your doctor for medical advice about side effects. You may report side effects to FDA at 1-800-FDA-1088. Where should I keep my medicine? Keep out of the reach of children. Store between 15 and 30 degrees C (59 and 86 degrees F). Throw away any unused medicine after the expiration date. NOTE: This sheet is a summary. It may not cover all possible information. If you have questions about this medicine, talk to your doctor, pharmacist, or health care provider.  2012, Elsevier/Gold Standard. (02/17/2008 4:43:44 PM)

## 2012-03-16 NOTE — Telephone Encounter (Signed)
gave patient appointment for 04-15-2012 printed out calendar and gave to the patient

## 2012-03-16 NOTE — Progress Notes (Signed)
OFFICE PROGRESS NOTE  CC Jasmine Kelp, MD [name], MD, MD [address] [name]. [facility] [place] [address]  DIAGNOSIS: 71 year old female with invasive ductal carcinoma of the right breast involving the right nipple diagnosed November 2012   PRIOR THERAPY:   #1 status post ultrasound-guided core biopsy of the mass at the 11:00 position 3 cm from the right nipple. The pathology revealed invasive mammary carcinoma with carcinoma in situ grade 2 tumor was estrogen receptor +100% progesterone receptor +3% proliferation marker 38% HER-2/neu negative.  #2 MRI of the breast showed 2.5 x 2.4 x 1.8 cm spiculated enhancing mass in the anterior aspect of the upper outer quadrant of the right breast with smaller linear and nodular enhancing masses extending to the biopsied mass to the right nipple.  #3 patient is status post punch biopsy of the right nipple with the pathology revealing an invasive mammary carcinoma as well.  #4 S/P 5 cycles of TC last given on 02/24/12  #5. Post chemotherapy MRI on 02/29/12 with decrease in size of the breast mass.  CURRENT THERAPY: . Status Post cycle #5 of TC  INTERVAL HISTORY: [name] 71 y.o. female returns for followup visit today. Overall she is doing well she tolerated cycle 5 of her chemotherapy well without any significant complaints. Today she denies any fevers chills night sweats headaches shortness of breath chest pains palpitations no difficulty in swallowing her porta site cath looks great she has no nausea or vomiting no hematuria hematochezia melena hemoptysis hematemesis no easy bruising she has not noticed any skin changes. Has some blurring of vision, complain of dry eyes. Still having some tingling in the fingertips but no numbness or any worsening in the pain of the fingertips. No headaches or dizziness, no bruising or bleeding. Remainder of the 10 point review of systems is negative.  MEDICAL HISTORY: Past Medical  History  Diagnosis Date  . GERD (gastroesophageal reflux disease)   . Asthma     daily and prn inhalers  . Sleep apnea     uses CPAP occ. - to bring machine DOS  . Grave's disease     no current meds.  . Arthritis     osteo - s/p right total knee  . Spondylosis     cervical and lumbar  . Hyperlipidemia   . Diabetes mellitus     diet-controlled  . Breast cancer     right breast  . Hypertension     ALLERGIES:  is allergic to zocor.  MEDICATIONS:  Current Outpatient Prescriptions  Medication Sig Dispense Refill  . albuterol (PROVENTIL HFA;VENTOLIN HFA) 108 (90 BASE) MCG/ACT inhaler Inhale 2 puffs into the lungs as needed.        Marland Kitchen aspirin 81 MG tablet Take 81 mg by mouth daily.       . cyclobenzaprine (FLEXERIL) 10 MG tablet Take 5 mg by mouth 3 (three) times daily as needed. For muscle spasms      . diltiazem (CARDIZEM CD) 240 MG 24 hr capsule Take 240 mg by mouth daily. AM      . ezetimibe (ZETIA) 10 MG tablet Take 10 mg by mouth daily. AM      . Fluticasone-Salmeterol (ADVAIR) 100-50 MCG/DOSE AEPB Inhale 1 puff into the lungs every 12 (twelve) hours.       . lansoprazole (PREVACID) 30 MG capsule Take 30 mg by mouth 2 (two) times daily.       Marland Kitchen lidocaine-prilocaine (EMLA) cream Apply topically as  needed.  30 g  4  . meloxicam (MOBIC) 7.5 MG tablet Take 5 mg by mouth daily as needed. For knee tightness      . ondansetron (ZOFRAN) 8 MG tablet Take by mouth every 8 (eight) hours as needed.        . prochlorperazine (COMPAZINE) 10 MG tablet Take 10 mg by mouth every 6 (six) hours as needed.        . rosuvastatin (CRESTOR) 5 MG tablet Take 5 mg by mouth daily. AM      . triamterene-hydrochlorothiazide (MAXZIDE-25) 37.5-25 MG per tablet Take 1 tablet by mouth daily. AM        SURGICAL HISTORY:  Past Surgical History  Procedure Date  . Lumbar laminectomy     back surg. x 2 - 1980's and 1999  . Total knee arthroplasty 07/28/2006    right  . Cataract extraction, bilateral   .  Bilateral oophorectomy 2009  . Partial hysterectomy   . Cardiac catheterization 04/10/2010, 05/07/2004    LAD lesion, blood flow OK, per pt.  . Portacath placement 11/27/2011    Procedure: INSERTION PORT-A-CATH;  Surgeon: Ernestene Mention, MD;  Location: [facility];  Service: General;  Laterality: Right;  . Lesion removal 11/27/2011    Procedure: LESION REMOVAL;  Surgeon: Ernestene Mention, MD;  Location: [facility];  Service: General;  Laterality: Right;  Skin tag removed from under right eye. No specimen  . Abdominal hysterectomy         ECOG PERFORMANCE STATUS: 0 - Asymptomatic  Blood pressure 133/69, pulse 109, temperature 97.6 F (36.4 C), height 5' 4.5" (1.638 m), weight 224 lb 14.4 oz (102.014 kg). Physical examination:   HEENT: EOMI PERRLA sclerae anicteric no conjunctival pallor oral mucosa is moist neck is supple NO palpable adenopathy LUNGS: are clear bilaterally to auscultation and percussion CVS: is regular rate rhythm no murmurs gallops or rubs GI: abdomen is soft nontender nondistended bowel sounds are present no hepatosplenomegaly  EXTREMITIES: extremities no clubbing edema or cyanosis CNS: neuro patient's alert oriented x3 strength is symmetrical in upper and lower extremities. Breast examination left breast no masses nipple discharge right breast no masses nipple discharge no skin changes   LABORATORY DATA: Lab Results  Component Value Date   WBC 6.6 03/16/2012   HGB 10.6* 03/16/2012   HCT 31.9* 03/16/2012   MCV 91.8 03/16/2012   PLT 333 03/16/2012      Chemistry      Component Value Date/Time   NA 140 03/02/2012 1017   K 3.5 03/02/2012 1017   CL 98 03/02/2012 1017   CO2 31 03/02/2012 1017   BUN 12 03/02/2012 1017   CREATININE 0.89 03/02/2012 1017      Component Value Date/Time   CALCIUM 9.5 03/02/2012 1017   ALKPHOS 80 03/02/2012 1017   AST 12 03/02/2012 1017   ALT 15 03/02/2012 1017   BILITOT 0.4 03/02/2012 1017       RADIOGRAPHIC  STUDIES: Clinical Data: Known right breast invasive ductal carcinoma and  DCIS. Response to neoadjuvant chemotherapy.  BILATERAL BREAST MRI WITH AND WITHOUT CONTRAST  Technique: Multiplanar, multisequence MR images of both breasts  were obtained prior to and following the intravenous administration  of 20ml of multihance. Three dimensional images were evaluated at  the independent DynaCad workstation.  Comparison: Prior MRI dated 11/03/2011 and mammograms dated  10/28/2011, 08/08/2009 and 08/23/2007.Findings: Mild background  parenchymal enhancement pattern.  The spiculated, enhancing mass in the anterior  aspect of the upper  outer quadrant of the right breast is significantly smaller and  shows less enhancement compared to the prior MRI dated 11/03/2011.  It measures 1.3 x 1.6 x 1.1 cm compared to 2.5 x 2.4 x 1.8 cm on  the prior MRI. The smaller areas of linear and nodular enhancement  extending from the mass to the nipple is less prominent and there  is less enhancement and edema of the nipple areolar complex. No  enlarged axillary or internal mammary adenopathy is detected.  No abnormal enhancement is seen in the left breast.  Right-sided Port-A-Cath.  IMPRESSION:  Significant interval reduction in the size and enhancement of the  mass in the upper outer quadrant of the right breast with extension  into the nipple areolar complex. Continued treatment planning is  recommended.  THREE-DIMENSIONAL MR IMAGE RENDERING ON INDEPENDENT WORKSTATION:  Three-dimensional MR images were rendered by post-processing of the  original MR data on an independent workstation. The three-  dimensional MR images were interpreted, and findings were reported  in the accompanying complete MRI report for this study.  BI-RADS CATEGORY 6: Known biopsy-proven malignancy - appropriate  action should be taken.   ASSESSMENT: 71 year old female with 1.  7.5 cm mass in the right breast with positive skin biopsy  at the nipple of the right breast. Patient's tumor is ER positive PR positive HER-2/neu negative.   #3 s/p 5 cycles of neoadjuvant chemotherapy consisting of taxotere and cytoxan. Last cycle given on 02/24/12  PLAN: #1 Patient has completed all of her chemotherapy. She had MRI of the breasts performed and her case was discussed at the Multidisciplinary breast conference. The mass in the right breast has shrunk significantly.  2. She will now proceed with her surgery. A lumpectomy is planned for 4/12, after that I will see her back and she will also be seen by RT for post lumpectomy radiation therapy  3. She has had genetic testing done and she was negative for  BRCA 1 and BRCA2   4. Port can be removed at the time of her surgery for the breast on 4/12  All questions were answered. The patient knows to call the clinic with any problems, questions or concerns. We can certainly see the patient much sooner if necessary.  I spent 25 minutes counseling the patient and coordinating care with a 30 minute appointment.    Drue Second, MD Medical/Oncology [facility] [phone] (beeper) [phone] (Office)  03/16/2012, 3:18 PM

## 2012-03-17 ENCOUNTER — Ambulatory Visit: Payer: Medicare Other

## 2012-03-21 HISTORY — PX: BREAST LUMPECTOMY: SHX2

## 2012-03-22 ENCOUNTER — Encounter: Payer: Self-pay | Admitting: Family

## 2012-03-22 ENCOUNTER — Ambulatory Visit (HOSPITAL_BASED_OUTPATIENT_CLINIC_OR_DEPARTMENT_OTHER): Payer: Medicare Other | Admitting: Family

## 2012-03-22 ENCOUNTER — Other Ambulatory Visit (HOSPITAL_BASED_OUTPATIENT_CLINIC_OR_DEPARTMENT_OTHER): Payer: Medicare Other | Admitting: Lab

## 2012-03-22 VITALS — BP 124/68 | HR 98 | Temp 98.5°F | Ht 64.5 in | Wt 225.2 lb

## 2012-03-22 DIAGNOSIS — C50919 Malignant neoplasm of unspecified site of unspecified female breast: Secondary | ICD-10-CM

## 2012-03-22 DIAGNOSIS — Z09 Encounter for follow-up examination after completed treatment for conditions other than malignant neoplasm: Secondary | ICD-10-CM

## 2012-03-22 DIAGNOSIS — Z170 Estrogen receptor positive status [ER+]: Secondary | ICD-10-CM

## 2012-03-22 LAB — CBC WITH DIFFERENTIAL/PLATELET
BASO%: 0.9 % (ref 0.0–2.0)
EOS%: 4.8 % (ref 0.0–7.0)
HCT: 31.4 % — ABNORMAL LOW (ref 34.8–46.6)
MCH: 30.3 pg (ref 25.1–34.0)
MCHC: 33.1 g/dL (ref 31.5–36.0)
MONO#: 0.7 10*3/uL (ref 0.1–0.9)
RBC: 3.43 10*6/uL — ABNORMAL LOW (ref 3.70–5.45)
RDW: 19.2 % — ABNORMAL HIGH (ref 11.2–14.5)
WBC: 5.2 10*3/uL (ref 3.9–10.3)
lymph#: 0.6 10*3/uL — ABNORMAL LOW (ref 0.9–3.3)
nRBC: 0 % (ref 0–0)

## 2012-03-22 LAB — COMPREHENSIVE METABOLIC PANEL
ALT: 12 U/L (ref 0–35)
AST: 13 U/L (ref 0–37)
Albumin: 4.1 g/dL (ref 3.5–5.2)
Alkaline Phosphatase: 62 U/L (ref 39–117)
Calcium: 9.8 mg/dL (ref 8.4–10.5)
Chloride: 99 mEq/L (ref 96–112)
Potassium: 2.9 mEq/L — ABNORMAL LOW (ref 3.5–5.3)
Sodium: 140 mEq/L (ref 135–145)

## 2012-03-22 NOTE — Progress Notes (Signed)
OFFICE PROGRESS NOTE  CC [name], MD [name], MD, MD [address] [name]. [facility] [place] [address]  DIAGNOSIS: Invasive ductal carcinoma of the right breast involving the right nipple, diagnosed November 2012   PRIOR THERAPY:  1. Ultrasound-guided core biopsy, 11 o'clock position, 3 cm from the right nipple. Pathology revealed invasive mammary carcinoma with carcinoma in situ, grade 2, ER +100% PR +3%, proliferation marker 38% HER-2/neu negative. 2. MRI of the breast showed 2.5 x 2.4 x 1.8 cm spiculated enhancing mass in the anterior aspect of the upper outer quadrant of the right breast with smaller linear and nodular enhancing masses extending to the biopsied mass to the right nipple. 3. Punch biopsy of the right nipple with pathology revealing invasive mammary carcinoma. 4. Neoadjuvant chemotherapy consisting of Taxotere and Cytoxan, 5 cycles.   CURRENT THERAPY: Completed chemo, awaiting surgery.   INTERVAL HISTORY: Overall doing well, tolerated chemotherapy well without any significant complaints. Denies fevers, chills, night sweats, headaches, shortness of breath, chest pains, palpitations. No difficulty swallowing. Port-a- site cath without signs or symptoms of infection. No nausea or vomiting, hematuria, hematochezia, melena, hemoptysis, hematemesis. No easy bruising or skin changes. Appetite waxes and wans, complains food "has no taste". No real improvement since completing chemo. Mild - moderate arthralgias/myalgias.  Mild tingling in the fingertips, no numbness or worsening in the pain of the fingertips. Remainder of the 10 point review of systems is negative.  Scheduled for wire localized partial mastectomy and sentinel node biopsy 04/01/12 with Dr. Derrell Lolling. Will need radiation consult post-op.   MRI done in prior to surgery showed good response to chemo with reduction in size of cancer.   MEDICAL HISTORY: Past Medical History  Diagnosis Date    . GERD (gastroesophageal reflux disease)   . Asthma     daily and prn inhalers  . Sleep apnea     uses CPAP occ. - to bring machine DOS  . Grave's disease     no current meds.  . Arthritis     osteo - s/p right total knee  . Spondylosis     cervical and lumbar  . Hyperlipidemia   . Diabetes mellitus     diet-controlled  . Breast cancer     right breast  . Hypertension     ALLERGIES:  is allergic to zocor.  SURGICAL HISTORY:  Past Surgical History  Procedure Date  . Lumbar laminectomy     back surg. x 2 - 1980's and 1999  . Total knee arthroplasty 07/28/2006    right  . Cataract extraction, bilateral   . Bilateral oophorectomy 2009  . Partial hysterectomy   . Cardiac catheterization 04/10/2010, 05/07/2004    LAD lesion, blood flow OK, per pt.  . Portacath placement 11/27/2011    Procedure: INSERTION PORT-A-CATH;  Surgeon: Ernestene Mention, MD;  Location: [facility];  Service: General;  Laterality: Right;  . Lesion removal 11/27/2011    Procedure: LESION REMOVAL;  Surgeon: Ernestene Mention, MD;  Location: [facility];  Service: General;  Laterality: Right;  Skin tag removed from under right eye. No specimen  . Abdominal hysterectomy    ECOG PERFORMANCE STATUS: 0 - Asymptomatic  Blood pressure 124/68, pulse 98, temperature 98.5 F (36.9 C), temperature source Oral, height 5' 4.5" (1.638 m), weight 225 lb 3.2 oz (102.15 kg). Physical examination: General: Well developed, well nourished, in no acute distress.  EENT: No ocular or oral lesions. No stomatitis.  Respiratory: Lungs are clear to auscultation bilaterally with normal respiratory movement and no accessory muscle use. Cardiac: No murmur, rub or tachycardia. No upper or lower extremity edema.  GI: Abdomen is soft, no palpable hepatosplenomegaly. No fluid wave. No tenderness. Musculoskeletal: No kyphosis, no tenderness over the spine, ribs or hips. Lymph: No cervical, infraclavicular,  axillary or inguinal adenopathy. Neuro: No focal neurological deficits. Psych: Alert and oriented X 3, appropriate mood and affect.    LABORATORY DATA: Lab Results  Component Value Date   WBC 5.2 03/22/2012   HGB 10.4* 03/22/2012   HCT 31.4* 03/22/2012   MCV 91.7 03/22/2012   PLT 333 03/22/2012   ASSESSMENT: 71 year old female with 1. Right breast cancer, completed neoadjuvant chemotherapy.  2. Definitive surgery planned for 04/01/12.  3. Labs are recovering, adequate for surgery.  PLAN: 1.  Proceed with surgery. 2. Return to clinic 4/26 with Dr. Welton Flakes for pathology review and recommendations based on those results. No lab needed for that visit.  3. She will need radiation consult following surgery.    All questions were answered. The patient knows to call the clinic with any problems, questions or concerns. We can certainly see the patient much sooner if necessary.  I

## 2012-03-23 ENCOUNTER — Other Ambulatory Visit: Payer: Self-pay | Admitting: *Deleted

## 2012-03-23 MED ORDER — POTASSIUM CHLORIDE ER 10 MEQ PO TBCR: 20.0000 meq | EXTENDED_RELEASE_TABLET | Freq: Two times a day (BID) | ORAL | Status: DC

## 2012-03-24 ENCOUNTER — Other Ambulatory Visit: Payer: Self-pay | Admitting: *Deleted

## 2012-03-24 ENCOUNTER — Ambulatory Visit (INDEPENDENT_AMBULATORY_CARE_PROVIDER_SITE_OTHER): Payer: Medicare Other | Admitting: General Surgery

## 2012-03-24 DIAGNOSIS — C50911 Malignant neoplasm of unspecified site of right female breast: Secondary | ICD-10-CM

## 2012-03-29 ENCOUNTER — Telehealth: Payer: Self-pay | Admitting: *Deleted

## 2012-03-29 ENCOUNTER — Encounter (HOSPITAL_BASED_OUTPATIENT_CLINIC_OR_DEPARTMENT_OTHER): Payer: Self-pay | Admitting: *Deleted

## 2012-03-29 NOTE — Pre-Procedure Instructions (Signed)
To come for CBC, CMET, UA, CXR

## 2012-03-29 NOTE — Pre-Procedure Instructions (Signed)
Last office note and recent cardiac testing req. from Dr. Mindi Junker Little's office

## 2012-03-29 NOTE — Telephone Encounter (Signed)
Pt called lmovm advised she will have labs drawn at surgery center and since they will be drawing the K+level in the lab she would rather have it drawn just once."

## 2012-03-31 ENCOUNTER — Ambulatory Visit
Admission: RE | Admit: 2012-03-31 | Discharge: 2012-03-31 | Disposition: A | Discharge status: Home / Self Care | Payer: Medicare Other | Source: Ambulatory Visit | Attending: General Surgery | Admitting: General Surgery

## 2012-03-31 ENCOUNTER — Encounter (HOSPITAL_BASED_OUTPATIENT_CLINIC_OR_DEPARTMENT_OTHER)
Admission: RE | Admit: 2012-03-31 | Discharge: 2012-03-31 | Disposition: A | Discharge status: Home / Self Care | Payer: Medicare Other | Source: Ambulatory Visit | Attending: General Surgery | Admitting: General Surgery

## 2012-03-31 ENCOUNTER — Other Ambulatory Visit: Payer: Medicare Other | Admitting: Lab

## 2012-03-31 LAB — URINE MICROSCOPIC-ADD ON

## 2012-03-31 LAB — COMPREHENSIVE METABOLIC PANEL
Albumin: 3.9 g/dL (ref 3.5–5.2)
Alkaline Phosphatase: 73 U/L (ref 39–117)
BUN: 16 mg/dL (ref 6–23)
Calcium: 9.5 mg/dL (ref 8.4–10.5)
Potassium: 4.3 mEq/L (ref 3.5–5.1)
Sodium: 138 mEq/L (ref 135–145)
Total Protein: 6.5 g/dL (ref 6.0–8.3)

## 2012-03-31 LAB — CBC
HCT: 32 % — ABNORMAL LOW (ref 36.0–46.0)
MCH: 29.9 pg (ref 26.0–34.0)
MCHC: 32.8 g/dL (ref 30.0–36.0)
RDW: 16.8 % — ABNORMAL HIGH (ref 11.5–15.5)

## 2012-03-31 LAB — URINALYSIS, ROUTINE W REFLEX MICROSCOPIC
Glucose, UA: NEGATIVE mg/dL
Ketones, ur: NEGATIVE mg/dL
Nitrite: NEGATIVE
Specific Gravity, Urine: 1.014 (ref 1.005–1.030)
pH: 5.5 (ref 5.0–8.0)

## 2012-03-31 NOTE — H&P (Signed)
Diagnoses  Reason for Visit    Breast cancer, right breast - Primary  Breast Cancer   174.9  new breast cancer eval breast and check PAC           Vitals        BP  Pulse  Temp(Src)  Resp  Ht  Wt    146/72  106  97.6 F (36.4 C) (Temporal)  18  5' 4.5" (1.638 m)  221 lb 3.2 oz (100.336 kg)           BMI               37.38 kg/m2                   History and Physical    Ernestene Mention, MD   Subjective:    Patient ID: [name], female DOB: 1941-06-12, 71 y.o. 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 She had a palpable mass in the right breast in the upper outer quadrant just outside the areolar margin. Image guided biopsy showed invasive mammary carcinoma, ER 100%, PR 3%, HER-2-negative, Ki-67 38%. She also had cancer involving the nipple and a skin punch biopsy was positive for cancer. Initial MRI showed a 2.5 x 2.4 x 1.8 cm mass in the upper-outer quadrant of the right breast.  She went on to have Port-A-Cath placement. There have been no problems with the port. She has received 5 of 6 total planned cycles of chemotherapy. The last cycle is scheduled for March 27. She receives Neulasta after each treatment.  Genetic testing has been done and reportedly this is negative.  Recent MRI on February 29, 2012 shows that the tumor is responding. There is no adenopathy. The tumor is 1.3 x 1.6 x 1.1 cm.  The patient is still motivated to have breast conservation surgery.  Review of Systems  12 system review of systems is performed and is negative except as described above.    Past Medical History  Diagnosis Date  . GERD (gastroesophageal reflux disease)   . Asthma     daily and prn inhalers  . Grave's disease     no current meds.  . Arthritis     osteo - s/p right total knee  . Spondylosis     cervical and lumbar  .  Hyperlipidemia   . Breast cancer     right breast  . Hypertension     under control with med.  . Sleep apnea sleep study 09/18/2005    uses CPAP occ. - to bring machine DOS  . S/P chemotherapy, time since 4-12 weeks finished 02/20/2011  . Diabetes mellitus     diet-controlled   No current facility-administered medications for this encounter.   Current Outpatient Prescriptions  Medication Sig Dispense Refill  . albuterol (PROVENTIL HFA;VENTOLIN HFA) 108 (90 BASE) MCG/ACT inhaler Inhale 2 puffs into the lungs as needed.        Marland Kitchen aspirin 81 MG tablet Take 81 mg by mouth daily.       . cyclobenzaprine (FLEXERIL) 10 MG tablet Take 5 mg by mouth as needed. For muscle spasms      . diltiazem (CARDIZEM CD) 240 MG 24 hr  capsule Take 240 mg by mouth daily. AM      . ezetimibe (ZETIA) 10 MG tablet Take 10 mg by mouth daily. AM      . Fluticasone-Salmeterol (ADVAIR) 100-50 MCG/DOSE AEPB Inhale 1 puff into the lungs every 12 (twelve) hours.       . lansoprazole (PREVACID) 30 MG capsule Take 30 mg by mouth 2 (two) times daily.       . meloxicam (MOBIC) 7.5 MG tablet Take 5 mg by mouth as needed. For knee tightness      . potassium chloride (K-DUR) 10 MEQ tablet Take 2 tablets (20 mEq total) by mouth 2 (two) times daily.  20 tablet  0  . rosuvastatin (CRESTOR) 5 MG tablet Take 5 mg by mouth daily. AM      . triamterene-hydrochlorothiazide (MAXZIDE-25) 37.5-25 MG per tablet Take 1 tablet by mouth daily. AM       Allergies  Allergen Reactions  . Lactose Intolerance (Gi) Other (See Comments)    ABD. CRAMPS  . Lipitor (Atorvastatin Calcium) Other (See Comments)    LEG CRAMPS  . Zocor (Simvastatin - High Dose) Other (See Comments)    Leg cramps   Family History  Problem Relation Age of Onset  . Cancer Maternal Grandfather     unaware  . Anesthesia problems Cousin     pt. states she is not aware of any family anes. prob., does not know where this comment came from   History  Substance Use Topics    . Smoking status: Never Smoker   . Smokeless tobacco: Never Used  . Alcohol Use: No      Objective:    Physical Exam  The patient looks well. In no distress.  Neck no adenopathy no mass no jugular venous tension  Breast right breast is examined. The breasts are large. There is no palpable mass anywhere. The skin of the nipple looked less thickened. No gross evidence of cancer by physical exam. No axillary adenopathy.     Assessment:     Invasive mammary carcinoma right breast, upper outer quadrant, with involvement of skin the nipple. Receptor positive, HER-2-negative, Ki-67 38%. Pretreatment clinical stage T4, N0; IIIB  Satisfactory downstaging from neoadjuvant chemotherapy by physical exam and by MRI.  I think that she will be a candidate for a breast conservation procedure. This will require central lumpectomy with removal of the nipple areolar complex and wire localization and sentinel node biopsy. That is her desire as well.  Negative genetic testing for genetic mutation.     Plan:     For now, I advised that she complete her sixth and final cycle of neoadjuvant chemotherapy which is scheduled for March 27.  I will request that her case be. discussed at breast conference tumor board next Wednesday.  Return to see me first week of April after she completes her chemotherapy. At that point in time I anticipate we will schedule for right partial mastectomy with needle localization procedure (Central lumpectomy) , sentinel node biopsy. We will leave her port in until Dr. Welton Flakes advises [place] that it may be removed, and the patient is aware of this.  I've discussed indications and details and risks of the surgery with her in great detail. She understands these issues. Her questions were answered. She agrees with the plan as outlined.    Angelia Mould. Derrell Lolling, M.D., [facility]  General and Minimally invasive Surgery  Breast and Colorectal Surgery  Office: [phone]  Pager: [phone]

## 2012-04-01 ENCOUNTER — Ambulatory Visit
Admission: RE | Admit: 2012-04-01 | Discharge: 2012-04-01 | Disposition: A | Discharge status: Home / Self Care | Payer: Medicare Other | Source: Ambulatory Visit | Attending: General Surgery | Admitting: General Surgery

## 2012-04-01 ENCOUNTER — Telehealth (INDEPENDENT_AMBULATORY_CARE_PROVIDER_SITE_OTHER): Payer: Self-pay

## 2012-04-01 ENCOUNTER — Ambulatory Visit (HOSPITAL_COMMUNITY)
Admission: RE | Admit: 2012-04-01 | Discharge: 2012-04-01 | Disposition: A | Discharge status: Home / Self Care | Payer: Medicare Other | Source: Ambulatory Visit | Attending: General Surgery | Admitting: General Surgery

## 2012-04-01 ENCOUNTER — Encounter (HOSPITAL_BASED_OUTPATIENT_CLINIC_OR_DEPARTMENT_OTHER): Payer: Self-pay | Admitting: Certified Registered Nurse Anesthetist

## 2012-04-01 ENCOUNTER — Encounter (HOSPITAL_BASED_OUTPATIENT_CLINIC_OR_DEPARTMENT_OTHER)
Admission: RE | Disposition: A | Discharge status: Home / Self Care | Payer: Self-pay | Source: Ambulatory Visit | Attending: General Surgery

## 2012-04-01 ENCOUNTER — Encounter (HOSPITAL_BASED_OUTPATIENT_CLINIC_OR_DEPARTMENT_OTHER): Payer: Self-pay | Admitting: *Deleted

## 2012-04-01 ENCOUNTER — Ambulatory Visit (HOSPITAL_BASED_OUTPATIENT_CLINIC_OR_DEPARTMENT_OTHER): Payer: Medicare Other | Admitting: Certified Registered Nurse Anesthetist

## 2012-04-01 ENCOUNTER — Ambulatory Visit (HOSPITAL_BASED_OUTPATIENT_CLINIC_OR_DEPARTMENT_OTHER)
Admission: RE | Admit: 2012-04-01 | Discharge: 2012-04-01 | Disposition: A | Discharge status: Home / Self Care | Payer: Medicare Other | Source: Ambulatory Visit | Attending: General Surgery | Admitting: General Surgery

## 2012-04-01 DIAGNOSIS — R443 Hallucinations, unspecified: Secondary | ICD-10-CM | POA: Insufficient documentation

## 2012-04-01 DIAGNOSIS — C50419 Malignant neoplasm of upper-outer quadrant of unspecified female breast: Secondary | ICD-10-CM | POA: Insufficient documentation

## 2012-04-01 DIAGNOSIS — C50911 Malignant neoplasm of unspecified site of right female breast: Secondary | ICD-10-CM

## 2012-04-01 DIAGNOSIS — G4730 Sleep apnea, unspecified: Secondary | ICD-10-CM | POA: Insufficient documentation

## 2012-04-01 DIAGNOSIS — Z01812 Encounter for preprocedural laboratory examination: Secondary | ICD-10-CM | POA: Insufficient documentation

## 2012-04-01 DIAGNOSIS — J45909 Unspecified asthma, uncomplicated: Secondary | ICD-10-CM | POA: Insufficient documentation

## 2012-04-01 DIAGNOSIS — K219 Gastro-esophageal reflux disease without esophagitis: Secondary | ICD-10-CM | POA: Insufficient documentation

## 2012-04-01 DIAGNOSIS — C50919 Malignant neoplasm of unspecified site of unspecified female breast: Secondary | ICD-10-CM

## 2012-04-01 DIAGNOSIS — E119 Type 2 diabetes mellitus without complications: Secondary | ICD-10-CM | POA: Insufficient documentation

## 2012-04-01 DIAGNOSIS — E0500 Thyrotoxicosis with diffuse goiter without thyrotoxic crisis or storm: Secondary | ICD-10-CM | POA: Insufficient documentation

## 2012-04-01 DIAGNOSIS — I10 Essential (primary) hypertension: Secondary | ICD-10-CM | POA: Insufficient documentation

## 2012-04-01 HISTORY — DX: Personal history of antineoplastic chemotherapy: Z92.21

## 2012-04-01 HISTORY — PX: MASTECTOMY PARTIAL / LUMPECTOMY: SUR851

## 2012-04-01 SURGERY — PARTIAL MASTECTOMY WITH AXILLARY SENTINEL LYMPH NODE BIOPSY
Anesthesia: General | Site: Breast | Laterality: Right | Wound class: Clean

## 2012-04-01 MED ORDER — MORPHINE SULFATE 2 MG/ML IJ SOLN: 2.0000 mg | INTRAMUSCULAR | Status: DC | PRN

## 2012-04-01 MED ORDER — SODIUM CHLORIDE 0.9 % IV SOLN: INTRAVENOUS | Status: DC

## 2012-04-01 MED ORDER — ONDANSETRON HCL 4 MG/2ML IJ SOLN: 4.0000 mg | Freq: Four times a day (QID) | INTRAMUSCULAR | Status: DC | PRN

## 2012-04-01 MED ORDER — PROPOFOL 10 MG/ML IV EMUL
INTRAVENOUS | Status: DC | PRN
  Administered 2012-04-01: 200 mg via INTRAVENOUS

## 2012-04-01 MED ORDER — TECHNETIUM TC 99M SULFUR COLLOID FILTERED
1.0000 | Freq: Once | INTRAVENOUS | Status: AC | PRN
  Administered 2012-04-01: 1 via INTRADERMAL

## 2012-04-01 MED ORDER — LACTATED RINGERS IV SOLN
INTRAVENOUS | Status: DC
  Administered 2012-04-01 (×3): via INTRAVENOUS

## 2012-04-01 MED ORDER — HEPARIN SODIUM (PORCINE) 5000 UNIT/ML IJ SOLN
5000.0000 [IU] | Freq: Once | INTRAMUSCULAR | Status: AC
  Administered 2012-04-01: 5000 [IU] via SUBCUTANEOUS

## 2012-04-01 MED ORDER — PROMETHAZINE HCL 25 MG/ML IJ SOLN
6.2500 mg | INTRAMUSCULAR | Status: DC | PRN
  Administered 2012-04-01: 6.25 mg via INTRAVENOUS

## 2012-04-01 MED ORDER — OXYCODONE HCL 5 MG PO TABS: 5.0000 mg | ORAL_TABLET | ORAL | Status: DC | PRN

## 2012-04-01 MED ORDER — SODIUM CHLORIDE 0.9 % IV SOLN: 250.0000 mL | INTRAVENOUS | Status: DC | PRN

## 2012-04-01 MED ORDER — SODIUM CHLORIDE 0.9 % IJ SOLN: 3.0000 mL | INTRAMUSCULAR | Status: DC | PRN

## 2012-04-01 MED ORDER — FENTANYL CITRATE 0.05 MG/ML IJ SOLN
50.0000 ug | INTRAMUSCULAR | Status: DC | PRN
  Administered 2012-04-01: 50 ug via INTRAVENOUS

## 2012-04-01 MED ORDER — 0.9 % SODIUM CHLORIDE (POUR BTL) OPTIME
TOPICAL | Status: DC | PRN
  Administered 2012-04-01: 1000 mL

## 2012-04-01 MED ORDER — MIDAZOLAM HCL 2 MG/2ML IJ SOLN
1.0000 mg | INTRAMUSCULAR | Status: DC | PRN
  Administered 2012-04-01: 1 mg via INTRAVENOUS

## 2012-04-01 MED ORDER — HYDROCODONE-ACETAMINOPHEN 5-325 MG PO TABS: 1.0000 | ORAL_TABLET | ORAL | Status: AC | PRN

## 2012-04-01 MED ORDER — MEPERIDINE HCL 25 MG/ML IJ SOLN: 6.2500 mg | INTRAMUSCULAR | Status: DC | PRN

## 2012-04-01 MED ORDER — LIDOCAINE HCL (CARDIAC) 20 MG/ML IV SOLN
INTRAVENOUS | Status: DC | PRN
  Administered 2012-04-01: 60 mg via INTRAVENOUS

## 2012-04-01 MED ORDER — CHLORHEXIDINE GLUCONATE 4 % EX LIQD: 1.0000 "application " | Freq: Once | CUTANEOUS | Status: DC

## 2012-04-01 MED ORDER — ONDANSETRON HCL 4 MG/2ML IJ SOLN
INTRAMUSCULAR | Status: DC | PRN
  Administered 2012-04-01 (×2): 4 mg via INTRAVENOUS

## 2012-04-01 MED ORDER — METOCLOPRAMIDE HCL 5 MG/ML IJ SOLN
INTRAMUSCULAR | Status: DC | PRN
  Administered 2012-04-01: 10 mg via INTRAVENOUS

## 2012-04-01 MED ORDER — HYDROMORPHONE HCL PF 1 MG/ML IJ SOLN: 0.2500 mg | INTRAMUSCULAR | Status: DC | PRN

## 2012-04-01 MED ORDER — ACETAMINOPHEN 650 MG RE SUPP: 650.0000 mg | RECTAL | Status: DC | PRN

## 2012-04-01 MED ORDER — CEFAZOLIN SODIUM-DEXTROSE 2-3 GM-% IV SOLR
2.0000 g | INTRAVENOUS | Status: AC
  Administered 2012-04-01: 2 g via INTRAVENOUS

## 2012-04-01 MED ORDER — ACETAMINOPHEN 10 MG/ML IV SOLN
INTRAVENOUS | Status: DC | PRN
  Administered 2012-04-01: 1000 mg via INTRAVENOUS

## 2012-04-01 MED ORDER — FENTANYL CITRATE 0.05 MG/ML IJ SOLN
INTRAMUSCULAR | Status: DC | PRN
  Administered 2012-04-01: 25 ug via INTRAVENOUS
  Administered 2012-04-01 (×2): 50 ug via INTRAVENOUS

## 2012-04-01 MED ORDER — SODIUM CHLORIDE 0.9 % IJ SOLN
INTRAMUSCULAR | Status: DC | PRN
  Administered 2012-04-01: 3 mL via INTRAVENOUS

## 2012-04-01 MED ORDER — BUPIVACAINE-EPINEPHRINE 0.5% -1:200000 IJ SOLN
INTRAMUSCULAR | Status: DC | PRN
  Administered 2012-04-01: 17 mL

## 2012-04-01 MED ORDER — ACETAMINOPHEN 325 MG PO TABS: 650.0000 mg | ORAL_TABLET | ORAL | Status: DC | PRN

## 2012-04-01 MED ORDER — METHYLENE BLUE 1 % INJ SOLN
INTRAMUSCULAR | Status: DC | PRN
  Administered 2012-04-01: 2 mL via SUBMUCOSAL

## 2012-04-01 MED ORDER — SODIUM CHLORIDE 0.9 % IJ SOLN: 3.0000 mL | Freq: Two times a day (BID) | INTRAMUSCULAR | Status: DC

## 2012-04-01 SURGICAL SUPPLY — 65 items
APL SKNCLS STERI-STRIP NONHPOA (GAUZE/BANDAGES/DRESSINGS)
APPLIER CLIP 11 MED OPEN (CLIP)
APR CLP MED 11 20 MLT OPN (CLIP)
BANDAGE ELASTIC 6 VELCRO ST LF (GAUZE/BANDAGES/DRESSINGS) IMPLANT
BENZOIN TINCTURE PRP APPL 2/3 (GAUZE/BANDAGES/DRESSINGS) IMPLANT
BINDER BREAST XXLRG (GAUZE/BANDAGES/DRESSINGS) ×2 IMPLANT
BLADE HEX COATED 2.75 (ELECTRODE) ×2 IMPLANT
BLADE SURG 10 STRL SS (BLADE) IMPLANT
BLADE SURG 15 STRL LF DISP TIS (BLADE) ×2 IMPLANT
BLADE SURG 15 STRL SS (BLADE) ×4
CANISTER SUCTION 1200CC (MISCELLANEOUS) ×2 IMPLANT
CHLORAPREP W/TINT 26ML (MISCELLANEOUS) ×2 IMPLANT
CLIP APPLIE 11 MED OPEN (CLIP) IMPLANT
CLIP TI MEDIUM 6 (CLIP) IMPLANT
CLOTH BEACON ORANGE TIMEOUT ST (SAFETY) ×2 IMPLANT
COVER MAYO STAND STRL (DRAPES) ×2 IMPLANT
COVER PROBE W GEL 5X96 (DRAPES) ×2 IMPLANT
COVER TABLE BACK 60X90 (DRAPES) ×2 IMPLANT
DECANTER SPIKE VIAL GLASS SM (MISCELLANEOUS) IMPLANT
DERMABOND ADVANCED (GAUZE/BANDAGES/DRESSINGS) ×2
DERMABOND ADVANCED .7 DNX12 (GAUZE/BANDAGES/DRESSINGS) ×2 IMPLANT
DEVICE DUBIN W/COMP PLATE 8390 (MISCELLANEOUS) ×2 IMPLANT
DRAIN CHANNEL 19F RND (DRAIN) IMPLANT
DRAIN HEMOVAC 1/8 X 5 (WOUND CARE) IMPLANT
DRAPE LAPAROSCOPIC ABDOMINAL (DRAPES) ×2 IMPLANT
DRAPE UTILITY XL STRL (DRAPES) ×2 IMPLANT
DRSG PAD ABDOMINAL 8X10 ST (GAUZE/BANDAGES/DRESSINGS) ×4 IMPLANT
ELECT REM PT RETURN 9FT ADLT (ELECTROSURGICAL) ×2
ELECTRODE REM PT RTRN 9FT ADLT (ELECTROSURGICAL) ×1 IMPLANT
EVACUATOR SILICONE 100CC (DRAIN) IMPLANT
GAUZE SPONGE 4X4 12PLY STRL LF (GAUZE/BANDAGES/DRESSINGS) IMPLANT
GAUZE SPONGE 4X4 16PLY XRAY LF (GAUZE/BANDAGES/DRESSINGS) IMPLANT
GLOVE ECLIPSE 6.5 STRL STRAW (GLOVE) ×2 IMPLANT
GLOVE EUDERMIC 7 POWDERFREE (GLOVE) ×2 IMPLANT
GOWN PREVENTION PLUS XLARGE (GOWN DISPOSABLE) ×2 IMPLANT
GOWN PREVENTION PLUS XXLARGE (GOWN DISPOSABLE) ×2 IMPLANT
KIT MARKER MARGIN INK (KITS) ×2 IMPLANT
NDL SAFETY ECLIPSE 18X1.5 (NEEDLE) ×1 IMPLANT
NEEDLE HYPO 18GX1.5 SHARP (NEEDLE) ×2
NEEDLE HYPO 25X1 1.5 SAFETY (NEEDLE) ×4 IMPLANT
NS IRRIG 1000ML POUR BTL (IV SOLUTION) ×2 IMPLANT
PACK BASIN DAY SURGERY FS (CUSTOM PROCEDURE TRAY) ×2 IMPLANT
PAD ALCOHOL SWAB (MISCELLANEOUS) ×2 IMPLANT
PENCIL BUTTON HOLSTER BLD 10FT (ELECTRODE) ×2 IMPLANT
PIN SAFETY STERILE (MISCELLANEOUS) IMPLANT
SLEEVE SCD COMPRESS KNEE MED (MISCELLANEOUS) ×2 IMPLANT
SPONGE GAUZE 4X4 12PLY (GAUZE/BANDAGES/DRESSINGS) ×2 IMPLANT
SPONGE LAP 18X18 X RAY DECT (DISPOSABLE) IMPLANT
SPONGE LAP 4X18 X RAY DECT (DISPOSABLE) ×4 IMPLANT
STAPLER VISISTAT 35W (STAPLE) ×2 IMPLANT
STRIP CLOSURE SKIN 1/2X4 (GAUZE/BANDAGES/DRESSINGS) IMPLANT
SUT ETHILON 3 0 FSL (SUTURE) IMPLANT
SUT MNCRL AB 4-0 PS2 18 (SUTURE) ×4 IMPLANT
SUT SILK 2 0 SH (SUTURE) ×2 IMPLANT
SUT VIC AB 2-0 CT1 27 (SUTURE)
SUT VIC AB 2-0 CT1 TAPERPNT 27 (SUTURE) IMPLANT
SUT VIC AB 3-0 SH 27 (SUTURE)
SUT VIC AB 3-0 SH 27X BRD (SUTURE) IMPLANT
SUT VICRYL 3-0 CR8 SH (SUTURE) ×4 IMPLANT
SYR CONTROL 10ML LL (SYRINGE) ×4 IMPLANT
TOWEL OR 17X24 6PK STRL BLUE (TOWEL DISPOSABLE) ×2 IMPLANT
TOWEL OR NON WOVEN STRL DISP B (DISPOSABLE) ×2 IMPLANT
TUBE CONNECTING 20X1/4 (TUBING) ×2 IMPLANT
WATER STERILE IRR 1000ML POUR (IV SOLUTION) IMPLANT
YANKAUER SUCT BULB TIP NO VENT (SUCTIONS) ×2 IMPLANT

## 2012-04-01 NOTE — Transfer of Care (Signed)
Immediate Anesthesia Transfer of Care Note  Patient: [name]  Procedure(s) Performed: Procedure(s) (LRB): PARTIAL MASTECTOMY WITH AXILLARY SENTINEL LYMPH NODE BIOPSY (Right)  Patient Location: PACU  Anesthesia Type: General  Level of Consciousness: awake, alert , oriented and patient cooperative  Airway & Oxygen Therapy: Patient Spontanous Breathing and Patient connected to face mask oxygen  Post-op Assessment: Report given to PACU RN and Post -op Vital signs reviewed and stable  Post vital signs: Reviewed and stable  Complications: No apparent anesthesia complications

## 2012-04-01 NOTE — Anesthesia Postprocedure Evaluation (Signed)
Anesthesia Post Note  Patient: [name]  Procedure(s) Performed: Procedure(s) (LRB): PARTIAL MASTECTOMY WITH AXILLARY SENTINEL LYMPH NODE BIOPSY (Right)  Anesthesia type: General  Patient location: PACU  Post pain: Pain level controlled  Post assessment: Patient's Cardiovascular Status Stable  Last Vitals:  Filed Vitals:   04/01/12 1315  BP: 140/59  Pulse:   Temp: 36.7 C  Resp: 16    Post vital signs: Reviewed and stable  Level of consciousness: alert  Complications: No apparent anesthesia complications

## 2012-04-01 NOTE — Anesthesia Procedure Notes (Signed)
Procedure Name: LMA Insertion Date/Time: 04/01/2012 9:36 AM Performed by: Lue Sykora D Pre-anesthesia Checklist: Patient identified, Emergency Drugs available, Suction available and Patient being monitored Patient Re-evaluated:Patient Re-evaluated prior to inductionOxygen Delivery Method: Circle System Utilized Preoxygenation: Pre-oxygenation with 100% oxygen Intubation Type: IV induction Ventilation: Mask ventilation without difficulty LMA: LMA inserted LMA Size: 4.0 Number of attempts: 1 Placement Confirmation: positive ETCO2 Tube secured with: Tape Dental Injury: Teeth and Oropharynx as per pre-operative assessment

## 2012-04-01 NOTE — Anesthesia Preprocedure Evaluation (Signed)
Anesthesia Evaluation  Patient identified by MRN, date of birth, ID band Patient awake    Reviewed: Allergy & Precautions, H&P , NPO status , Patient's Chart, lab work & pertinent test results  History of Anesthesia Complications Negative for: history of anesthetic complications  Airway Mallampati: I  Neck ROM: Full    Dental  (+) Teeth Intact and Missing   Pulmonary asthma , sleep apnea and Continuous Positive Airway Pressure Ventilation ,  breath sounds clear to auscultation        Cardiovascular hypertension, Rhythm:Regular Rate:Normal     Neuro/Psych    GI/Hepatic Neg liver ROS, GERD-  Controlled,  Endo/Other  Diabetes mellitus-Hyperthyroidism Morbid obesity  Renal/GU      Musculoskeletal   Abdominal (+) + obese,   Peds  Hematology Anemia   Anesthesia Other Findings   Reproductive/Obstetrics                           Anesthesia Physical Anesthesia Plan  ASA: III  Anesthesia Plan: General   Post-op Pain Management:    Induction: Intravenous  Airway Management Planned: LMA  Additional Equipment:   Intra-op Plan:   Post-operative Plan: Extubation in OR  Informed Consent: I have reviewed the patients History and Physical, chart, labs and discussed the procedure including the risks, benefits and alternatives for the proposed anesthesia with the patient or authorized representative who has indicated his/her understanding and acceptance.   Dental advisory given  Plan Discussed with: CRNA and Surgeon  Anesthesia Plan Comments:         Anesthesia Quick Evaluation

## 2012-04-01 NOTE — Telephone Encounter (Signed)
Pt home doing well. PO appt made. 

## 2012-04-01 NOTE — Discharge Instructions (Signed)
[facility] Office Phone Number [phone]  BREAST BIOPSY/ PARTIAL MASTECTOMY: POST OP INSTRUCTIONS  Always review your discharge instruction sheet given to you by the facility where your surgery was performed.  IF YOU HAVE DISABILITY OR FAMILY LEAVE FORMS, YOU MUST BRING THEM TO THE OFFICE FOR PROCESSING.  DO NOT GIVE THEM TO YOUR DOCTOR.  1. A prescription for pain medication may be given to you upon discharge.  Take your pain medication as prescribed, if needed.  If narcotic pain medicine is not needed, then you may take acetaminophen (Tylenol) or ibuprofen (Advil) as needed. 2. Take your usually prescribed medications unless otherwise directed 3. If you need a refill on your pain medication, please contact your pharmacy.  They will contact our office to request authorization.  Prescriptions will not be filled after 5pm or on week-ends. 4. You should eat very light the first 24 hours after surgery, such as soup, crackers, pudding, etc.  Resume your normal diet the day after surgery. 5. Most patients will experience some swelling and bruising in the breast.  Ice packs and a good support bra will help.  Swelling and bruising can take several days to resolve.  6. It is common to experience some constipation if taking pain medication after surgery.  Increasing fluid intake and taking a stool softener will usually help or prevent this problem from occurring.  A mild laxative (Milk of Magnesia or Miralax) should be taken according to package directions if there are no bowel movements after 48 hours. 7. Unless discharge instructions indicate otherwise, you may remove your bandages 24-48 hours after surgery, and you may shower at that time.  You may have steri-strips (small skin tapes) in place directly over the incision.  These strips should be left on the skin for 7-10 days.  If your surgeon used skin glue on the incision, you may shower in 24 hours.  The glue will flake off over the  next 2-3 weeks.  Any sutures or staples will be removed at the office during your follow-up visit. 8. ACTIVITIES:  You may resume regular daily activities (gradually increasing) beginning the next day.  Wearing a good support bra or sports bra minimizes pain and swelling.  You may have sexual intercourse when it is comfortable. a. You may drive when you no longer are taking prescription pain medication, you can comfortably wear a seatbelt, and you can safely maneuver your car and apply brakes. b. RETURN TO WORK:  ______________________________________________________________________________________ 9. You should see your doctor in the office for a follow-up appointment approximately two weeks after your surgery.  Your doctor's nurse will typically make your follow-up appointment when she calls you with your pathology report.  Expect your pathology report 2-3 business days after your surgery.  You may call to check if you do not hear from [place] after three days. 10. OTHER INSTRUCTIONS: ____You may drive a car in 2-5 days. No sports or heavy lifting for 3 weeks. You may shower.___________________________________________________________________________________________ _____________________________________________________________________________________________________________________________________ _____________________________________________________________________________________________________________________________________ _____________________________________________________________________________________________________________________________________  WHEN TO CALL YOUR DOCTOR: 1. Fever over 101.0 2. Nausea and/or vomiting. 3. Extreme swelling or bruising. 4. Continued bleeding from incision. 5. Increased pain, redness, or drainage from the incision.  The clinic staff is available to answer your questions during regular business hours.  Please don't hesitate to call and ask to speak to one of  the nurses for clinical concerns.  If you have a medical emergency, go to the nearest emergency room or call 911.  A surgeon from  [facility] is always on call at the hospital.  For further questions, please visit [email or web address]   Post Anesthesia Home Care Instructions  Activity: Get plenty of rest for the remainder of the day. A responsible adult should stay with you for 24 hours following the procedure.  For the next 24 hours, DO NOT: -Drive a car -Advertising copywriter -Drink alcoholic beverages -Take any medication unless instructed by your physician -Make any legal decisions or sign important papers.  Meals: Start with liquid foods such as gelatin or soup. Progress to regular foods as tolerated. Avoid greasy, spicy, heavy foods. If nausea and/or vomiting occur, drink only clear liquids until the nausea and/or vomiting subsides. Call your physician if vomiting continues.  Special Instructions/Symptoms: Your throat may feel dry or sore from the anesthesia or the breathing tube placed in your throat during surgery. If this causes discomfort, gargle with warm salt water. The discomfort should disappear within 24 hours.

## 2012-04-01 NOTE — Op Note (Signed)
Patient Name:           [name]   Date of Surgery:        04/01/2012  Pre op Diagnosis:      Invasive mammary carcinoma right breast, upper outer quadrant, biopsy-proven nipple involvement, receptor positive, HER-2-negative. Pretreatment clinical stage T4, N0, stage IIIB  Post op Diagnosis:    same  Procedure:                 Inject blue dye right rest, right central lumpectomy with needle localization, right axillary sentinel lead biopsy.  Surgeon:                     Angelia Mould. Derrell Lolling, M.D., FACS  Assistant:                      none  Operative Indications:   This is a 71 year old woman who was diagnosed with right breast cancer in November 2012. She had a palpable mass in the right breast in the upper-outer quadrant just outside the areolar margin. Biopsy showed invasive mammary carcinoma, receptor positive, HER-2/neu negative. She also had cancer involving the nipple which was proven by punch biopsy. Initial MRI showed a 2.5 cm mass. She had a Port-A-Cath placed and has undergone neoadjuvant chemotherapy. Recent  MRI shows the tumor has responded and is now 1.6 cm in maximum dimension. She is strongly motivated to have breast conservation surgery. She is advised she will need to have a significant central lumpectomy with resection of the entire nipple areolar complex. Because the breast is quite large both she and I are comfortable with this approach. She is brought to the operating room electively.  Operative Findings:       We were able to resect the tumor and the nipple and areolar complex in one lumpectomy and specimen mammogram looked good. We found 2 sentinel lymph nodes.  Procedure in Detail:          The patient underwent wire localization at the breast center of  [name] this morning. The wire entered superiolaterally and was directed posteriorly and slightly inferiorly. The wire was about 3 cm outside the areolar margin. The patient was brought to [facility] day surgery center where  the nuclear medicine technician injected radionuclide into the right breast. The patient was taken to the operating room. Gen. Anesthesia was induced. A timeout was performed. Following alcohol prep I injected 5 cc of blue dye on into the right breast, subareolar area, and massaged the breast for 5 minutes. The right breast and axilla and chest wall were then prepped and draped in a sterile fashion. Intravenous antibiotics were given prior to the procedure  0.5% Marcaine with epinephrine was used as a local infiltration anesthetic. Using a marking pen I marked the area of the tumor and the entire nipple areolar complex and drew  a transverse elliptical incision to encompass everything. I then made a transverse elliptical incision. I dissected widely around the tumor and the breast tissue. The incision itself was probably almost 20 cm in length. The specimen was removed and marked with the 6 hour Marker kit. The specimen mammogram was performed and showed that the marker clip and wire were completely contained within the specimen. The specimen was then sent to the lab. The breast incision was irrigated with saline. Hemostasis was excellent. The breast tissue was closed in 3 layers, a deep layer of interrupted 3-0 Vicryl, superficial layer of 3-0  Vicryl, and the skin was closed with a running subcuticular suture of 4-0 Monocryl and Dermabond.  I made a transverse skin crease incision in the right axilla just at the hairline. Using the neoprobe I dissected down to the axilla and found 2 sentinel lymph nodes. One was very hot and very blue. The second one was smaller and was very hot and had very little blue in it. After both of these nodes were removed there was almost no  radioactivity left. The axillary incision was irrigated with saline. Hemostasis was excellent. The axillary soft tissue was closed with interrupted sutures of 3-0 Vicryl and the skin closed with a running subcuticular suture of 4-0 Monocryl and  Dermabond. ABD pads and a breast binder were placed. The patient was taken to recovery room in stable condition. EBL 25 cc. Complications none. Counts correct.     Angelia Mould. Derrell Lolling, M.D., FACS General and Minimally Invasive Surgery Breast and Colorectal Surgery  04/01/2012 10:55 AM

## 2012-04-01 NOTE — Interval H&P Note (Signed)
History and Physical Interval Note:  04/01/2012 8:53 AM  [name]  has presented today for surgery, with the diagnosis of cancer right breast   The goals of treatment and the various methods of treatment have been discussed with the patient and family. After consideration of risks, benefits and other options for treatment, the patient has consented to  Procedure(s) (LRB): PARTIAL MASTECTOMY WITH AXILLARY SENTINEL LYMPH NODE BIOPSY (Right) as a surgical intervention .  The patients' history has been reviewed and the patient examined today, no change in status, stable for surgery.  I have reviewed the patients' chart and labs.  Questions were answered to the patient's satisfaction.     Ernestene Mention

## 2012-04-05 NOTE — Progress Notes (Signed)
Quick Note:  Inform patient of Pathology report,. ______ 

## 2012-04-06 ENCOUNTER — Telehealth (INDEPENDENT_AMBULATORY_CARE_PROVIDER_SITE_OTHER): Payer: Self-pay

## 2012-04-06 NOTE — Telephone Encounter (Signed)
Pt notified of path result and po appt given.

## 2012-04-07 ENCOUNTER — Encounter: Payer: Self-pay | Admitting: *Deleted

## 2012-04-08 ENCOUNTER — Telehealth: Payer: Self-pay | Admitting: Oncology

## 2012-04-08 NOTE — Telephone Encounter (Signed)
S/w the pt and she is aware of the new appt time on 04/15/2012 from 3:00pm to 1:00pm

## 2012-04-15 ENCOUNTER — Encounter: Payer: Self-pay | Admitting: Oncology

## 2012-04-15 ENCOUNTER — Ambulatory Visit (HOSPITAL_BASED_OUTPATIENT_CLINIC_OR_DEPARTMENT_OTHER): Payer: Medicare Other | Admitting: Oncology

## 2012-04-15 ENCOUNTER — Telehealth: Payer: Self-pay | Admitting: Oncology

## 2012-04-15 VITALS — BP 134/73 | HR 96 | Temp 98.0°F | Ht 65.5 in | Wt 225.8 lb

## 2012-04-15 DIAGNOSIS — Z170 Estrogen receptor positive status [ER+]: Secondary | ICD-10-CM

## 2012-04-15 DIAGNOSIS — C50019 Malignant neoplasm of nipple and areola, unspecified female breast: Secondary | ICD-10-CM

## 2012-04-15 DIAGNOSIS — C50911 Malignant neoplasm of unspecified site of right female breast: Secondary | ICD-10-CM

## 2012-04-15 NOTE — Telephone Encounter (Signed)
gve the pt her July 2013 appt calendar °

## 2012-04-15 NOTE — Patient Instructions (Signed)
1. We diiscussed your pathology and a copy was given to you  2. Referral to Radiation Oncology was made.  3. I will see you back in 2 months

## 2012-04-15 NOTE — Progress Notes (Signed)
OFFICE PROGRESS NOTE  CC [name], MD [name], MD, MD [address] [name]. [facility] [place] [address]  DIAGNOSIS: 71 year old female with invasive ductal carcinoma of the right breast involving the right nipple diagnosed November 2012   PRIOR THERAPY:   #1 status post ultrasound-guided core biopsy of the mass at the 11:00 position 3 cm from the right nipple. The pathology revealed invasive mammary carcinoma with carcinoma in situ grade 2 tumor was estrogen receptor +100% progesterone receptor +3% proliferation marker 38% HER-2/neu negative.  #2 MRI of the breast showed 2.5 x 2.4 x 1.8 cm spiculated enhancing mass in the anterior aspect of the upper outer quadrant of the right breast with smaller linear and nodular enhancing masses extending to the biopsied mass to the right nipple.  #3 patient is status post punch biopsy of the right nipple with the pathology revealing an invasive mammary carcinoma as well.  #4 S/P 5 cycles of TC last given on 02/24/12  #5. Post chemotherapy MRI on 02/29/12 with decrease in size of the breast mass.  #6 patient is now status post right  lumpectomy with sentinel node biopsy. The final pathology revealed an invasive ductal carcinoma with lymphovascular space invasion tumor measured 1.9 cm 2 sentinel nodes were negative for metastatic disease ER positive PR positive HER-2/neu negative.  CURRENT THERAPY: . Patient to be referred to radiation oncology for  post lumpectomy Radiation  INTERVAL HISTORY: OPAL DINNING 71 y.o. female returns for followup visit today. She has had her lumpectomy. She tolerated the procedure well. Her case was discussed at the multidisciplinary breast conference and her scans and pathology were reviewed. She did have a 1.9 cm residual tumor. Final pathology is T4 based on skin involvement. She herself is feeling well she tolerated surgery well without any significant problems. There is a significant  asymmetry of the right breast in comparison to the left breast. We did discuss reconstruction to make the breasts were symmetrical. However patient states that under close she feels that the breasts look equal in size. She denies any nausea vomiting fevers chills night sweats headaches shortness of breath chest pains palpitations no myalgias or arthralgias. Remainder of the 10 point review of systems is negative.  MEDICAL HISTORY: Past Medical History  Diagnosis Date  . GERD (gastroesophageal reflux disease)   . Asthma     daily and prn inhalers  . Grave's disease     no current meds.  . Arthritis     osteo - s/p right total knee  . Spondylosis     cervical and lumbar  . Hyperlipidemia   . Breast cancer     right breast  . Hypertension     under control with med.  . Sleep apnea sleep study 09/18/2005    uses CPAP occ. - to bring machine DOS  . S/P chemotherapy, time since 4-12 weeks finished 02/20/2011  . Diabetes mellitus     diet-controlled    ALLERGIES:  is allergic to lactose intolerance (gi); lipitor; and zocor.  MEDICATIONS:  Current Outpatient Prescriptions  Medication Sig Dispense Refill  . albuterol (PROVENTIL HFA;VENTOLIN HFA) 108 (90 BASE) MCG/ACT inhaler Inhale 2 puffs into the lungs as needed.        Marland Kitchen aspirin 81 MG tablet Take 81 mg by mouth daily.       . cyclobenzaprine (FLEXERIL) 10 MG tablet Take 5 mg by mouth as needed. For muscle spasms      . diltiazem (CARDIZEM CD)  240 MG 24 hr capsule Take 240 mg by mouth daily. AM      . ezetimibe (ZETIA) 10 MG tablet Take 10 mg by mouth daily. AM      . Fluticasone-Salmeterol (ADVAIR) 100-50 MCG/DOSE AEPB Inhale 1 puff into the lungs every 12 (twelve) hours.       Marland Kitchen HYDROcodone-acetaminophen (VICODIN) 5-500 MG per tablet Take 1 tablet by mouth every 6 (six) hours as needed.      . lansoprazole (PREVACID) 30 MG capsule Take 30 mg by mouth 2 (two) times daily.       . meloxicam (MOBIC) 7.5 MG tablet Take 5 mg by mouth as  needed. For knee tightness      . rosuvastatin (CRESTOR) 5 MG tablet Take 5 mg by mouth daily. AM      . triamterene-hydrochlorothiazide (MAXZIDE-25) 37.5-25 MG per tablet Take 1 tablet by mouth daily. AM      . potassium chloride (K-DUR) 10 MEQ tablet Take 2 tablets (20 mEq total) by mouth 2 (two) times daily.  20 tablet  0    SURGICAL HISTORY:  Past Surgical History  Procedure Date  . Lumbar laminectomy     back surg. x 2 - 1980's and 1999  . Total knee arthroplasty 07/28/2006    right  . Cataract extraction, bilateral   . Bilateral oophorectomy 2009  . Partial hysterectomy   . Cardiac catheterization 04/10/2010, 05/07/2004    LAD lesion, blood flow OK, per pt.  . Portacath placement 11/27/2011    Procedure: INSERTION PORT-A-CATH;  Surgeon: Ernestene Mention, MD;  Location: [facility];  Service: General;  Laterality: Right;  . Lesion removal 11/27/2011    Procedure: LESION REMOVAL;  Surgeon: Ernestene Mention, MD;  Location: [facility];  Service: General;  Laterality: Right;  Skin tag removed from under right eye. No specimen  . Abdominal hysterectomy   . Carpal tunnel release     left        ECOG PERFORMANCE STATUS: 0 - Asymptomatic  Blood pressure 134/73, pulse 96, temperature 98 F (36.7 C), temperature source Oral, height 5' 5.5" (1.664 m), weight 225 lb 12.8 oz (102.422 kg). Physical examination:   HEENT: EOMI PERRLA sclerae anicteric no conjunctival pallor oral mucosa is moist neck is supple NO palpable adenopathy LUNGS: are clear bilaterally to auscultation and percussion CVS: is regular rate rhythm no murmurs gallops or rubs GI: abdomen is soft nontender nondistended bowel sounds are present no hepatosplenomegaly  EXTREMITIES: extremities no clubbing edema or cyanosis CNS: neuro patient's alert oriented x3 strength is symmetrical in upper and lower extremities. Breast examination left breast no masses nipple discharge right breast no masses  nipple discharge no skin changes   LABORATORY DATA: Lab Results  Component Value Date   WBC 4.5 03/31/2012   HGB 10.5* 03/31/2012   HCT 32.0* 03/31/2012   MCV 91.2 03/31/2012   PLT 366 03/31/2012      Chemistry      Component Value Date/Time   NA 138 03/31/2012 0910   K 4.3 03/31/2012 0910   CL 101 03/31/2012 0910   CO2 27 03/31/2012 0910   BUN 16 03/31/2012 0910   CREATININE 0.82 03/31/2012 0910      Component Value Date/Time   CALCIUM 9.5 03/31/2012 0910   ALKPHOS 73 03/31/2012 0910   AST 16 03/31/2012 0910   ALT 14 03/31/2012 0910   BILITOT 0.3 03/31/2012 0910     ADDITIONAL INFORMATION: 1. PROGNOSTIC INDICATORS -  ACIS Results IMMUNOHISTOCHEMICAL AND MORPHOMETRIC ANALYSIS BY THE AUTOMATED CELLULAR IMAGING SYSTEM (ACIS) Estrogen Receptor (Negative, <1%): 90%, STRONG (MANUALLY) STAINING INTENSITY Progesterone Receptor (Negative, <1%): 34%, MODERATE STAINING INTENSITY All controls stained appropriately Pecola Leisure MD Pathologist, Electronic Signature ( Signed 04/10/2012) 1. CHROMOGENIC IN-SITU HYBRIDIZATION Interpretation HER-2/NEU BY CISH - NO AMPLIFICATION OF HER-2 DETECTED. THE RATIO OF HER-2: CEP 17 SIGNALS WAS 1.03. Reference range: Ratio: HER2:CEP17 < 1.8 - gene amplification not observed Ratio: HER2:CEP 17 1.8-2.2 - equivocal result Ratio: HER2:CEP17 > 2.2 - gene amplification observed Pecola Leisure MD Pathologist, Electronic Signature ( Signed 04/10/2012) 1 of 4 FINAL for [place], [place] ([id number]) FINAL DIAGNOSIS Diagnosis 1. Breast, lumpectomy, Central right - INVASIVE DUCTAL CARCINOMA (1.9 CM), SEE COMMENT. - INVASIVE TUMOR IS 0.5 CM FROM NEAREST MARGIN (SUPERIOR MARGIN) - LYMPHOVASCULAR AND PERINEURAL INVASION PRESENT. - TUMOR INVOLVES SKIN/NIPPLE - TUMOR INVOLVES NIPPLE DERMAL LYMPHATICS - DUCTAL CARCINOMA IN SITU - SEE TUMOR SYNOPTIC TEMPLATE BELOW 2. Lymph node, sentinel, biopsy, Right axillary - ONE LYMPH NODE, NEGATIVE FOR TUMOR (0/1) 3. Lymph node,  sentinel, biopsy, Right axillary #2 - ONE LYMPH NODE, NEGATIVE FOR TUMOR (0/1) Microscopic Comment 1. BREAST, INVASIVE TUMOR, WITH LYMPH NODE SAMPLING Specimen, including laterality: Right breast Procedure: Lumpectomy Grade: II of III Tubule formation: 3 Nuclear pleomorphism: 2 Mitotic: 1 Tumor size (gross measurement): 1.9 cm Margins: Invasive, distance to closest margin: 0.5 cm In-situ, distance to closest margin: 0.5 cm If margin positive, focally or broadly: N/A Lymphovascular invasion: Present Ductal carcinoma in situ: Present Grade: II of III Extensive intraductal component: Absent Lobular neoplasia: Absent Tumor focality: Unifocal Treatment effect: Present If present, treatment effect in breast tissue, lymph nodes or both: Breast tissue and lymph nodes. Extent of tumor: Skin: Positive for tumor Nipple: Positive for tumor Skeletal muscle: N/A Lymph nodes: # examined: 2 number positive: 0 Breast prognostic profile: Estrogen receptor: Repeated, previous study demonstrated 100% positivity ([id number]) Progesterone receptor: Repeated, previous study demonstrated 3% positivity ([id number]) Her 2 neu: Repeated, previous study demonstrated no amplification ([id number]). Ki-67: Not repeated, previous study demonstrated 38% proliferation rate ([id number]). Non-neoplastic breast: Neoadjuvant related change and calcifications. Previous biopsy site identified. TNM: ypT4, pN0, pMX Comments: None (CR:kh 04-05-12) [place] RUND DO Pathologist, Electronic Signature (Case signed 04/05/2012) 2 of 4   ASSESSMENT: 71 year old female with 1.  7.5 cm mass in the right breast with positive skin biopsy at the nipple of the right breast. Patient's tumor is ER positive PR positive HER-2/neu negative.   #2 s/p 5 cycles of neoadjuvant chemotherapy consisting of taxotere and cytoxan. Last cycle given on 02/24/12  #3 S/P right breast lumpectomy with sentinel node biopsy The final pathology  revealed a 1.9 cm residual disease 2 sentinel nodes were negative for metastatic disease. Final staging is ypT4N0. Tumor is estrogen receptor positive progesterone receptor positive HER-2/neu negative.  PLAN: #1 patient will now proceed to getting radiation therapy. A referral to Dr. Antony Blackbird has been made.  #2 once she completes radiation therapy then I will plan on starting her on antiestrogen therapy. She will receive aromatase inhibitor Arimidex 1 mg daily.  #3 patient has been set up to see me back in 2 months time in followup.   All questions were answered. The patient knows to call the clinic with any problems, questions or concerns. We can certainly see the patient much sooner if necessary.  I spent 25 minutes counseling the patient and coordinating care with a 30 minute appointment.    Drue Second, MD Medical/Oncology [facility] [phone] (beeper) [phone] (Office)  04/15/2012, 1:56 PM

## 2012-04-18 ENCOUNTER — Encounter (INDEPENDENT_AMBULATORY_CARE_PROVIDER_SITE_OTHER): Payer: Self-pay | Admitting: General Surgery

## 2012-04-18 ENCOUNTER — Ambulatory Visit (INDEPENDENT_AMBULATORY_CARE_PROVIDER_SITE_OTHER): Payer: Medicare Other | Admitting: General Surgery

## 2012-04-18 VITALS — BP 142/68 | HR 80 | Temp 97.2°F | Resp 18 | Ht 65.5 in | Wt 224.2 lb

## 2012-04-18 DIAGNOSIS — C50911 Malignant neoplasm of unspecified site of right female breast: Secondary | ICD-10-CM

## 2012-04-18 DIAGNOSIS — C50919 Malignant neoplasm of unspecified site of unspecified female breast: Secondary | ICD-10-CM

## 2012-04-18 NOTE — Patient Instructions (Signed)
The incision in your right breast and right axilla are healing without any complication. You have full range of motion of your right shoulder.  If you change your mind and would like physical therapy to your shoulder, please call me and I will arrange that.  Keep her appointments with Dr. Park Breed and Dr. Roselind Messier  Return to see Dr. Derrell Lolling in 4 months.

## 2012-04-18 NOTE — Progress Notes (Signed)
Subjective:     Patient ID: [name], female   DOB: 27-Aug-1941, 71 y.o.   MRN: [id number]  HPI [name] underwent right partial mastectomy and sentinel node biopsy on April 01, 2012. She had a central lumpectomy with removal of the nipple and areolar complex. She has done well from surgery. She has no pain. She has no arm swelling. She has no arm numbness. She has full range of motion of her right shoulder.  Final pathology shows invasive ductal carcinoma which had been down staged 1.9 cm in diameter. Margins were negative.2 sentinel lymph nodes were negative.  Lymphovascular invasion was positive. The tumor did involve the skin of the nipple and areola.Final pathology ypT4, yp N0.  Review of Systems     Objective:   Physical Exam Patient looks well. She is in no distress.  Breast: Right breast shows transverse central lumpetomy incision healing uneventfully. No hematoma or infection. Right axillary incision healing uneventfully. No swelling. Full range of motion right shoulder. No arm swelling. The right breast is smaller than the left but it is not dramatically smaller.    Assessment:     Invasive ductal carcinoma right breast, upper outer quadrant and involving nipple and areola. Status post neoadjuvant therapy.  Healing uneventfully following recent right partial mastectomy and sentinel node biopsy. The patient is satisfied with her breast for now and states that her bra fits well. No interest in reconstructive issues at this time.  Final pathology ypT4, ypN0, negative margins, 1.9 cm tumor, negative sentinel nodes.  S/p PAC insertion, no problems with port    Plan:     She has followup appointment with Dr. Park Breed and Dr. Roselind Messier.  She will need adjuvant radiation therapy  We can remove her Port-A-Cath when Dr. Welton Flakes is through giving her chemotherapy  Return to see me in 4 months.   Angelia Mould. Derrell Lolling, M.D., [facility] General and Minimally invasive  Surgery Breast and Colorectal Surgery Office:   [phone] Pager:   [phone]

## 2012-04-19 ENCOUNTER — Encounter: Payer: Self-pay | Admitting: Radiation Oncology

## 2012-04-19 DIAGNOSIS — C50919 Malignant neoplasm of unspecified site of unspecified female breast: Secondary | ICD-10-CM | POA: Insufficient documentation

## 2012-04-20 ENCOUNTER — Encounter: Payer: Self-pay | Admitting: Radiation Oncology

## 2012-04-20 ENCOUNTER — Ambulatory Visit
Admission: RE | Admit: 2012-04-20 | Discharge: 2012-04-20 | Disposition: A | Discharge status: Home / Self Care | Payer: Medicare Other | Source: Ambulatory Visit | Attending: Radiation Oncology | Admitting: Radiation Oncology

## 2012-04-20 VITALS — BP 109/71 | HR 76 | Temp 98.4°F | Wt 226.1 lb

## 2012-04-20 DIAGNOSIS — L989 Disorder of the skin and subcutaneous tissue, unspecified: Secondary | ICD-10-CM | POA: Insufficient documentation

## 2012-04-20 DIAGNOSIS — C50911 Malignant neoplasm of unspecified site of right female breast: Secondary | ICD-10-CM

## 2012-04-20 DIAGNOSIS — Z510 Encounter for antineoplastic radiation therapy: Secondary | ICD-10-CM | POA: Insufficient documentation

## 2012-04-20 DIAGNOSIS — L723 Sebaceous cyst: Secondary | ICD-10-CM | POA: Insufficient documentation

## 2012-04-20 DIAGNOSIS — C50919 Malignant neoplasm of unspecified site of unspecified female breast: Secondary | ICD-10-CM | POA: Insufficient documentation

## 2012-04-20 DIAGNOSIS — Z853 Personal history of malignant neoplasm of breast: Secondary | ICD-10-CM | POA: Insufficient documentation

## 2012-04-20 DIAGNOSIS — C50419 Malignant neoplasm of upper-outer quadrant of unspecified female breast: Secondary | ICD-10-CM

## 2012-04-20 NOTE — Progress Notes (Signed)
Here for radiation consultation and planning for diagnosis of right breast cancer(DCIS).Patient has completed chemotherapy which she tolerated well except for some discomfort from injection.

## 2012-04-20 NOTE — Progress Notes (Signed)
Radiation Oncology         [phone] ________________________________  Name: [name] MRN: [id number]  Date: 04/20/2012  DOB: 02-12-1941  Follow-Up Visit Note  CC: [name], MD, MD  Victorino December, MD  Diagnosis:   ypT4, pNo, pMx right breast cancer    Narrative:  The patient returns today for  follow-up. She has completed her neoadjuvant chemotherapy and definitive surgery including a right partial mastectomy and sentinel node procedure.   The patient's tumor did shrink with neoadjuvant therapy. Post treatment pathologic size was 1.9 cm. The tumor however did involve the skin and therefore pathologic stage yP T4. Given the location the patient did have the nipple areolar complex as well as additional skin removed at the time of her surgery. The patient's sentinel node the specimen showed no evidence of metastatic spread. The surgical margins were clear with the closest margin being 0.5 cm that being the superior margin.                          ALLERGIES:  is allergic to lactose intolerance (gi); lipitor; and zocor.  Meds: Current Outpatient Prescriptions  Medication Sig Dispense Refill  . albuterol (PROVENTIL HFA;VENTOLIN HFA) 108 (90 BASE) MCG/ACT inhaler Inhale 2 puffs into the lungs as needed.        Marland Kitchen aspirin 81 MG tablet Take 81 mg by mouth daily.       . cyclobenzaprine (FLEXERIL) 10 MG tablet Take 5 mg by mouth as needed. For muscle spasms      . diltiazem (CARDIZEM CD) 240 MG 24 hr capsule Take 240 mg by mouth daily. AM      . ezetimibe (ZETIA) 10 MG tablet Take 10 mg by mouth daily. AM      . Fluticasone-Salmeterol (ADVAIR) 100-50 MCG/DOSE AEPB Inhale 1 puff into the lungs every 12 (twelve) hours.       Marland Kitchen HYDROcodone-acetaminophen (VICODIN) 5-500 MG per tablet Take 1 tablet by mouth every 6 (six) hours as needed.      . lansoprazole (PREVACID) 30 MG capsule Take 30 mg by mouth 2 (two) times daily.       . meloxicam (MOBIC) 7.5 MG tablet Take 5 mg by mouth as needed.  For knee tightness      . rosuvastatin (CRESTOR) 5 MG tablet Take 5 mg by mouth daily. AM      . triamterene-hydrochlorothiazide (MAXZIDE-25) 37.5-25 MG per tablet Take 1 tablet by mouth daily. AM        Physical Findings: The patient is in no acute distress. Patient is alert and oriented.  weight is 226 lb 1.6 oz (102.558 kg). Her temperature is 98.4 F (36.9 C). Her blood pressure is 109/71 and her pulse is 76. Marland Kitchen  No palpable cervical subclavicular I. asked her adenopathy. The lungs are clear to auscultation. The heart has regular rhythm and rate. Examination of left breast reveals be large and pendulous without mass or nipple discharge. Examination of the right breast reveals the nipple areolar area to be surgically absent with the long horizontal scar along the anterior portion of the breast. This is healing well showing no signs of drainage or infection.  Lab Findings: Lab Results  Component Value Date   WBC 4.5 03/31/2012   HGB 10.5* 03/31/2012   HCT 32.0* 03/31/2012   MCV 91.2 03/31/2012   PLT 366 03/31/2012    @LASTCHEM @  Radiographic Findings: Chest 2 View  03/31/2012  *  RADIOLOGY REPORT*  Clinical Data: Preop for surgery for breast carcinoma  CHEST - 2 VIEW  Comparison: Portable chest x-ray of 11/27/2011  Findings: The lungs are clear.  Mediastinal contours appear normal. A right-sided power port Port-A-Cath is present with the tip just above the expected SVC - RA junction.  The heart is within normal limits in size.  No bony abnormality is seen.  There are degenerative changes however throughout the thoracic spine.  IMPRESSION: No active lung disease.  Power port Port-A-Cath tip in lower SVC.  Original Report Authenticated By: Juline Patch, M.D.   Mm Breast Surgical Specimen  04/01/2012  *RADIOLOGY REPORT*  Clinical Data:  Patient presents for needle localization of a biopsy-proven malignancy over the upper outer right periareolar region. The mass/clip will be localized.  NEEDLE  LOCALIZATION WITH MAMMOGRAPHIC GUIDANCE AND SPECIMEN RADIOGRAPH  Patient presents for needle localization prior to surgical excision.  I met with the patient and we discussed the procedure of needle localization including benefits and alternatives. We discussed the high likelihood of a successful procedure. We discussed the risks of the procedure, including infection, bleeding, tissue injury, and further surgery. Informed, written consent was given.  Using mammographic guidance, sterile technique, 2% lidocaine and a 7 cm modified Kopans needle, the mass/clip was localized using a superior to inferior approach.  The films are marked for Dr. Derrell Lolling.  Specimen radiograph was performed at , and confirms the mass/clip present in the tissue sample.  The specimen is marked for pathology.  IMPRESSION: Needle localization right  breast.  No apparent complications.  Original Report Authenticated By: Elba Barman, M.D.   Mm Breast Wire Localization Right  04/01/2012  *RADIOLOGY REPORT*  Clinical Data:  Patient presents for needle localization of a biopsy-proven malignancy over the upper outer right periareolar region. The mass/clip will be localized.  NEEDLE LOCALIZATION WITH MAMMOGRAPHIC GUIDANCE AND SPECIMEN RADIOGRAPH  Patient presents for needle localization prior to surgical excision.  I met with the patient and we discussed the procedure of needle localization including benefits and alternatives. We discussed the high likelihood of a successful procedure. We discussed the risks of the procedure, including infection, bleeding, tissue injury, and further surgery. Informed, written consent was given.  Using mammographic guidance, sterile technique, 2% lidocaine and a 7 cm modified Kopans needle, the mass/clip was localized using a superior to inferior approach.  The films are marked for Dr. Derrell Lolling.  Specimen radiograph was performed at , and confirms the mass/clip present in the tissue sample.  The specimen is marked  for pathology.  IMPRESSION: Needle localization right  breast.  No apparent complications.  Original Report Authenticated By: Elba Barman, M.D.    Impression: This  Patient would be reasonable candidate for breast conserving therapy with radiation therapy directed to the right breast. As above patient did have skin involvement of time for surgery but surgical margins were clear, after she had the nipple areola complex removed with additional skin.   Plan:  Treatment planning and simulation in the near future. The patient's therapy will be directed at the right breast area, with consideration for elective coverage of the high axilla and supraclavicular region given the patient's initial clinical stage and presence of lymphovascular space invasion on the final pathology specimen.  _____________________________________  Billie Lade, M.D.

## 2012-04-20 NOTE — Progress Notes (Signed)
Please see the Nurse Progress Note in the MD Initial Consult Encounter for this patient. 

## 2012-04-25 ENCOUNTER — Ambulatory Visit
Admission: RE | Admit: 2012-04-25 | Discharge: 2012-04-25 | Disposition: A | Discharge status: Home / Self Care | Payer: Medicare Other | Source: Ambulatory Visit | Attending: Radiation Oncology | Admitting: Radiation Oncology

## 2012-04-25 DIAGNOSIS — C50911 Malignant neoplasm of unspecified site of right female breast: Secondary | ICD-10-CM

## 2012-04-25 NOTE — Progress Notes (Signed)
Met with patient to discuss RO billing.  Patient had no concerns today. 

## 2012-04-25 NOTE — Progress Notes (Signed)
  Radiation Oncology         [phone] ________________________________  Name: [name] MRN: [id number]  Date: 04/25/2012  DOB: 12/18/1941  SIMULATION AND TREATMENT PLANNING NOTE  DIAGNOSIS:  ypT4 right breast cancer  NARRATIVE:  The patient was brought to the CT Simulation planning suite.  Identity was confirmed.  All relevant records and images related to the planned course of therapy were reviewed.  The patient freely provided informed written consent to proceed with treatment after reviewing the details related to the planned course of therapy. The consent form was witnessed and verified by the simulation staff.  Then, the patient was set-up in a stable reproducible  supine position for radiation therapy.  CT images were obtained.  Surface markings were placed.  The CT images were loaded into the planning software.  Then the target and avoidance structures were contoured.  Treatment planning then occurred.  The radiation prescription was entered and confirmed.  A total of 4 complex treatment devices were fabricated ( 3 MLC's and 1 custom neck mold). I have requested : Isodose Plan.     PLAN:  The patient will receive 63.0 Gy in 34 fractions.  ________________________________  -----------------------------------  Billie Lade, PhD, MD

## 2012-04-26 ENCOUNTER — Telehealth (INDEPENDENT_AMBULATORY_CARE_PROVIDER_SITE_OTHER): Payer: Self-pay

## 2012-04-26 NOTE — Telephone Encounter (Signed)
Pt had lumpectomy 3 weeks ago and was calling to see what restrictions she will have. Pt advised she can do minor lifting such as a gallon of milk. Pt advised she should be doing slow gentle stretching above head to encourage return of full use of shoulder. Pt advised that by 6 weeks she can return to full activities as long as she has no complications.

## 2012-04-29 ENCOUNTER — Encounter: Payer: Self-pay | Admitting: Radiation Oncology

## 2012-05-02 ENCOUNTER — Ambulatory Visit
Admission: RE | Admit: 2012-05-02 | Discharge: 2012-05-02 | Disposition: A | Discharge status: Home / Self Care | Payer: Medicare Other | Source: Ambulatory Visit | Attending: Radiation Oncology | Admitting: Radiation Oncology

## 2012-05-02 ENCOUNTER — Encounter: Payer: Self-pay | Admitting: Radiation Oncology

## 2012-05-02 NOTE — Progress Notes (Signed)
  Radiation Oncology         [phone] ________________________________  Name: [name] MRN: [id number]  Date: 04/29/2012  DOB: 1941/01/05  Simulation Verification Note  Status: outpatient  NARRATIVE: The patient was brought to the treatment unit and placed in the planned treatment position. The clinical setup was verified. Then port films were obtained and uploaded to the radiation oncology medical record software.  The treatment beams were carefully compared against the planned radiation fields. The position location and shape of the radiation fields was reviewed. They targeted volume of tissue appears to be appropriately covered by the radiation beams. Organs at risk appear to be excluded as planned.  Based on my personal review, I approved the simulation verification. The patient's treatment to the breast will proceed as planned.  ------------------------------------------------  Artist Pais Kathrynn Running, M.D.

## 2012-05-03 ENCOUNTER — Ambulatory Visit
Admission: RE | Admit: 2012-05-03 | Discharge: 2012-05-03 | Disposition: A | Discharge status: Home / Self Care | Payer: Medicare Other | Source: Ambulatory Visit | Attending: Radiation Oncology | Admitting: Radiation Oncology

## 2012-05-03 DIAGNOSIS — C50919 Malignant neoplasm of unspecified site of unspecified female breast: Secondary | ICD-10-CM

## 2012-05-03 MED ORDER — RADIAPLEXRX EX GEL
Freq: Once | CUTANEOUS | Status: AC
  Administered 2012-05-03: 1 via TOPICAL

## 2012-05-03 MED ORDER — ALRA NON-METALLIC DEODORANT (RAD-ONC)
1.0000 "application " | Freq: Once | TOPICAL | Status: AC
  Administered 2012-05-03: 1 via TOPICAL

## 2012-05-03 NOTE — Progress Notes (Signed)
Weekly Management Note Current Dose:  1.8 Gy  Projected Dose: 63 Gy   Narrative:  The patient presents for routine under treatment assessment.  CBCT/MVCT images/Port film x-rays were reviewed.  The chart was checked. Doing well. First tx today. Post sim education after this. Asked about what type of radiation she was having and how many treatments.  Physical Findings: No changes. Alert and oriented.   Impression:  The patient is tolerating radiation.  Plan:  Continue treatment as planned.

## 2012-05-03 NOTE — Progress Notes (Signed)
HERE TODAY FOR PUT OF RIGHT BREAST.  FIRST TX.  NO C/O

## 2012-05-04 ENCOUNTER — Ambulatory Visit
Admission: RE | Admit: 2012-05-04 | Discharge: 2012-05-04 | Disposition: A | Discharge status: Home / Self Care | Payer: Medicare Other | Source: Ambulatory Visit | Attending: Radiation Oncology | Admitting: Radiation Oncology

## 2012-05-05 ENCOUNTER — Ambulatory Visit
Admission: RE | Admit: 2012-05-05 | Discharge: 2012-05-05 | Disposition: A | Discharge status: Home / Self Care | Payer: Medicare Other | Source: Ambulatory Visit | Attending: Radiation Oncology | Admitting: Radiation Oncology

## 2012-05-05 ENCOUNTER — Telehealth (INDEPENDENT_AMBULATORY_CARE_PROVIDER_SITE_OTHER): Payer: Self-pay

## 2012-05-05 NOTE — Telephone Encounter (Signed)
Pt called stating she feels a firm area under her incision. No redness,swelling,drainage or pain. Pt has started her radiation this week. I advised pt she may be feeling the suture bed where deep tissue was closed and she will still have some fluid under her wound for a few weeks. Pt to have radiology ck her when she goes in Tuesday for nex treatment. Pt advised if they have any concern to call and we will be glad to see her.

## 2012-05-06 ENCOUNTER — Ambulatory Visit
Admission: RE | Admit: 2012-05-06 | Discharge: 2012-05-06 | Disposition: A | Discharge status: Home / Self Care | Payer: Medicare Other | Source: Ambulatory Visit | Attending: Radiation Oncology | Admitting: Radiation Oncology

## 2012-05-09 ENCOUNTER — Ambulatory Visit
Admission: RE | Admit: 2012-05-09 | Discharge: 2012-05-09 | Disposition: A | Discharge status: Home / Self Care | Payer: Medicare Other | Source: Ambulatory Visit | Attending: Radiation Oncology | Admitting: Radiation Oncology

## 2012-05-09 ENCOUNTER — Telehealth: Payer: Self-pay | Admitting: *Deleted

## 2012-05-09 DIAGNOSIS — C50911 Malignant neoplasm of unspecified site of right female breast: Secondary | ICD-10-CM

## 2012-05-09 NOTE — Progress Notes (Signed)
   Department of Radiation Oncology  Phone:  [phone] Fax:        [phone]   Weekly Management Note Current Dose: 7.2  Gy  Projected Dose: 63.0 Gy   Narrative:  The patient presents for routine under treatment assessment. Port film x-rays were reviewed.  The chart was checked.  The patient is seen prior to her fifth treatment. Over the weekend the patient developed increasing discomfort in the right axillary area and noticed question of a previously noted pilonidal cyst/boil. Patient also noticed some greenish drainage from this area. She denies any obvious chills or fever.  Physical Findings: Afebrile. Examination of the right axillary area reveals a boil measuring approximately 1-1/2-2 cm in size. There is some yellowish to greenish drainage from the area.  Impression:  The patient has had progression of a pilonidal cyst/boil in the right axillary area. . This is in the treatment area and  I will hold off on treatment today and tomorrow. Patient is scheduled to see Dr. Derrell Lolling tomorrow for further evaluation of this issue.  Plan:  Radiation therapy on hold for the next 2 days pending surgical evaluation  ----------------------------------  Billie Lade, PhD, MD

## 2012-05-09 NOTE — Telephone Encounter (Signed)
Patient called stating she had a boil under her right arm that opened up over the weekend and wants to see MD before  [facility] if she should have treatment, advised patient  to come 20 minutes  Before, patient agreed 8:32 AM

## 2012-05-09 NOTE — Progress Notes (Signed)
HERE TODAY FOR PUT BUT HAS NOT HAD TX YET DUE TO "BOIL" UNDER RIGHT ARM.  SHE SAYS IT OPENED YESTERDAY AND DRAINED A LITTLE BROWNISH GREEN MUCUS.  THIS IS TX #5 TODAY AND SKIN IS DOING FINE.  HAS PAIN AT THIS AREA RATES 4/10 .  TAKES VICODIN ROUTINELY FOR HIP PAIN AND THIS HAS HELPED.  USING RADIAPLEX.  NOTED RAISED AREA UNDER RIGHT ARM ABOUT THE SIZE OF A DIME WITH RED OPENING IN CENTER, NOTED ODOR FROM IT ALSO

## 2012-05-10 ENCOUNTER — Ambulatory Visit: Payer: Medicare Other

## 2012-05-10 ENCOUNTER — Encounter (INDEPENDENT_AMBULATORY_CARE_PROVIDER_SITE_OTHER): Payer: Self-pay | Admitting: General Surgery

## 2012-05-10 ENCOUNTER — Ambulatory Visit (INDEPENDENT_AMBULATORY_CARE_PROVIDER_SITE_OTHER): Payer: Medicare Other | Admitting: Internal Medicine

## 2012-05-10 ENCOUNTER — Telehealth: Payer: Self-pay | Admitting: *Deleted

## 2012-05-10 VITALS — BP 138/68 | HR 83 | Temp 97.0°F | Ht 65.5 in | Wt 227.2 lb

## 2012-05-10 DIAGNOSIS — L089 Local infection of the skin and subcutaneous tissue, unspecified: Secondary | ICD-10-CM

## 2012-05-10 DIAGNOSIS — L723 Sebaceous cyst: Secondary | ICD-10-CM

## 2012-05-10 NOTE — Telephone Encounter (Signed)
Patient called and went and saw Dr.Ingram, she had a sebaceous cyst under her arm, and was told "no radiation treatment for 1 week, willl see Dr. Derrell Lolling  Next week., she is doing ok, will inform Dr.Kinard ,thanked her for the call back 4:26 PM

## 2012-05-10 NOTE — Patient Instructions (Signed)
Patient will remove a small amount of the packing every day approximately half a centimeter. She will change the dressing twice a day with dry gauze. She may shower.  She will follow up in one week or sooner if problems occur.

## 2012-05-10 NOTE — Progress Notes (Signed)
Subjective:     Patient ID: [name], female   DOB: 05/08/1941, 71 y.o.   MRN: [id number]  HPI Patient is a 71 year old female who is had a small sized not under her right axilla for approximately one year. It originally was the size of a pea however after she started radiation on May 15 it began to enlarge significantly and has become the size of a golf ball. It is very tender especially over the last several days. She was seen by her chemotherapy physician and suggested followup with [place]. She had some drainage out of it over the weekend. She denies fevers or chills. Review of Systems Review of systems is negative except as indicated    Objective:   Physical Exam  Constitutional: She appears well-developed and well-nourished. No distress.  Skin:      Procedure:  The right axilla is prepped in a sterile fashion. 1% lidocaine with epi is infiltrated into the area and once adequate anesthesia is obtained a small incision is made across the area of fluctuance. A large amount of purulent material as well as cyst material is expressed with a foul odor present. The cyst wall is disrupted to the best of my abilities. The area is cleaned and a dressing is applied. Patient tolerated the procedure well.    Assessment:     Infected sebaceous cyst    Plan:     The patient will remove a small portion of packing every day. She will change the dressing twice daily. She may shower. She will followup in one week for recheck or sooner if needed.      Thomasenia Dowse, Hendricks Limes 05/10/2012 4:01 PM

## 2012-05-11 ENCOUNTER — Ambulatory Visit: Payer: Medicare Other

## 2012-05-12 ENCOUNTER — Ambulatory Visit: Payer: Medicare Other

## 2012-05-13 ENCOUNTER — Ambulatory Visit: Payer: Medicare Other

## 2012-05-17 ENCOUNTER — Ambulatory Visit (INDEPENDENT_AMBULATORY_CARE_PROVIDER_SITE_OTHER): Payer: Medicare Other | Admitting: General Surgery

## 2012-05-17 ENCOUNTER — Encounter (INDEPENDENT_AMBULATORY_CARE_PROVIDER_SITE_OTHER): Payer: Self-pay | Admitting: General Surgery

## 2012-05-17 ENCOUNTER — Ambulatory Visit: Payer: Medicare Other

## 2012-05-17 VITALS — BP 152/80 | HR 80 | Temp 96.8°F | Resp 18 | Ht 64.5 in | Wt 219.0 lb

## 2012-05-17 DIAGNOSIS — L02411 Cutaneous abscess of right axilla: Secondary | ICD-10-CM

## 2012-05-17 DIAGNOSIS — IMO0002 Reserved for concepts with insufficient information to code with codable children: Secondary | ICD-10-CM

## 2012-05-17 NOTE — Progress Notes (Signed)
CALL FROM KATIE IN RADIATION,SHE WANTS TO KNOW WHAT TO DO ABOUT Oda.  SAID Alainah CALLED TO SAY THAT HER SURGEON , DR. INGRAM CALLED HER TO SAY THAT SHE IS NOT TO [facility] FOR 10 - 15 DAYS AND HE WOULD EMAIL IT TO DR. KINARD.  I CHECKED WITH DR. KINARD AND HE SAID HE GOT THE EMAIL.  HE SAID TO TELL RADIATION TO HOLD HER TX UNTIL June 10TH.  I RELAYED THIS MESSAGE TO KATIE IN [facility].  SHE SAID SHE WOULD CALL MS. Dobias AND LET HER KNOW.

## 2012-05-17 NOTE — Patient Instructions (Signed)
You had an infected sebaceous cyst of the right axilla. The infection seems to be improving following the drainage and debridement last week.  Right now it looks like you do not need any further surgery.  Warm soaks on the right axillary wound 2 or 3 times a day.  Start doxycycline 100 mg twice a day for the next 8 days  You may resume radiation therapy in 2 weeks.  I'll see you in July, sooner if this does not promptly heal.

## 2012-05-17 NOTE — Progress Notes (Signed)
Subjective:     Patient ID: [name], female   DOB: 1941-11-27, 71 y.o.   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 She started her radiation therapy 2 weeks ago and she noted swelling in her right axilla. She saw Clance Boll, PA in our office who drained the abscessed sebaceous cyst and debrided the sebaceous material. She is not on antibiotics.  She states that Dr. Roselind Messier has suspended her radiation therapy and wanted my opinion as to when that could be restarted.  She states that the swelling was almost as big as a golf ball it is much smaller now and she feels better.  Review of Systems     Objective:   Physical Exam Patient looks well. She is in no distress.  Right axilla shows a 5 mm incision and open wound with minimal cellulitis. There is a slight amount of thickening but this is no more than about 6 or 7 mm in diameter. I explored the wound with a hemostat and there was no more purulence or sebaceous material.    Assessment:     Abscessed sebaceous cyst, right axilla, this appeared to be adequately debrided and drained although still has a low-grade infection of the soft tissue. No further surgical debridement as necessary.  Status post recent neoadjuvant chemotherapy, subsequent right partial mastectomy and node biopsy.    Plan:     Warm soaks twice daily.  Doxycycline 100 mg p.o. b.i.d. x8 days  Anticipate this will spontaneously heal without further surgery in less than 2 weeks  I think we should hold off on radiation therapy this week and next week and then restart the week after.  Return to see me in early July, sooner if there are any further problems.    Angelia Mould. Derrell Lolling, M.D., [facility] General and Minimally invasive Surgery Breast and Colorectal Surgery Office:   [phone] Pager:   [phone]

## 2012-05-18 ENCOUNTER — Telehealth: Payer: Self-pay | Admitting: Oncology

## 2012-05-18 ENCOUNTER — Ambulatory Visit: Payer: Medicare Other

## 2012-05-18 NOTE — Telephone Encounter (Signed)
S/w the pt and she is aware of her cancelled July appts that have been r/s to aug due to the md is out of the office

## 2012-05-19 ENCOUNTER — Ambulatory Visit: Payer: Medicare Other

## 2012-05-20 ENCOUNTER — Ambulatory Visit: Payer: Medicare Other

## 2012-05-20 NOTE — Progress Notes (Signed)
Encounter addended by: Delynn Flavin, RN on: 05/20/2012  6:10 PM<BR>     Documentation filed: Charges VN

## 2012-05-23 ENCOUNTER — Ambulatory Visit: Payer: Medicare Other

## 2012-05-24 ENCOUNTER — Ambulatory Visit: Admission: RE | Admit: 2012-05-24 | Payer: Medicare Other | Source: Ambulatory Visit

## 2012-05-25 ENCOUNTER — Ambulatory Visit: Payer: Medicare Other

## 2012-05-26 ENCOUNTER — Ambulatory Visit: Payer: Medicare Other

## 2012-05-27 ENCOUNTER — Ambulatory Visit: Payer: Medicare Other

## 2012-05-30 ENCOUNTER — Ambulatory Visit
Admission: RE | Admit: 2012-05-30 | Discharge: 2012-05-30 | Disposition: A | Discharge status: Home / Self Care | Payer: Medicare Other | Source: Ambulatory Visit | Attending: Radiation Oncology | Admitting: Radiation Oncology

## 2012-05-30 VITALS — BP 123/60 | HR 78 | Temp 98.5°F | Wt 220.9 lb

## 2012-05-30 DIAGNOSIS — C50419 Malignant neoplasm of upper-outer quadrant of unspecified female breast: Secondary | ICD-10-CM

## 2012-05-30 NOTE — Progress Notes (Signed)
    Weekly Management Note, right breast cancer Current Dose:  720 cGy  Projected Dose:  6300 cGy   Narrative:  The patient presents for routine under treatment assessment.  CBCT/MVCT images/Port film x-rays were reviewed.  The chart was checked. She is status post incision and drainage of sebaceous cyst with debridement of right axilla  in Dr.  Jacinto Halim office. I have reviewed Dr. Jacinto Halim notes. She has completed Doxcycline as of one week ago and now here for assessment to restart Radiation Therapy. She initially received 4 fractions to right breast.   Physical Findings:  weight is 220 lb 14.4 oz (100.2 kg). Her temperature is 98.5 F (36.9 C). Her blood pressure is 123/60 and her pulse is 78.  the area in her right upper axilla appears to have healed. There is a small palpable nodule about the size of an M&M consistent with either a small cyst or scar tissue.  Impression:  The patient has healed and is ready to resume radiotherapy.  Plan: Resume radiotherapy, Continue radiotherapy as planned.  ________________________________   Lonie Peak, M.D.

## 2012-05-30 NOTE — Progress Notes (Addendum)
Returns today after drainage of sebaceous cyst with debridement of right axilla.  in Dr. Margarita Sermons office.  She has as completed Doxcycline and now here for assessment to restart Radiation Therapy.  Initially received 4 fractions to right breast.   Area healed.

## 2012-05-31 ENCOUNTER — Ambulatory Visit
Admission: RE | Admit: 2012-05-31 | Discharge: 2012-05-31 | Disposition: A | Discharge status: Home / Self Care | Payer: Medicare Other | Source: Ambulatory Visit | Attending: Radiation Oncology | Admitting: Radiation Oncology

## 2012-05-31 DIAGNOSIS — C50911 Malignant neoplasm of unspecified site of right female breast: Secondary | ICD-10-CM

## 2012-05-31 NOTE — Progress Notes (Signed)
HERE FOR PUT OF RIGHT BREAST TODAY.  NO C/O.  SKIN LOOKS GOOD ON BREAST.  SKIN UNDER ARM LOOKS GOOD AT SITE OF LANCING BOIL.

## 2012-05-31 NOTE — Progress Notes (Signed)
   Department of Radiation Oncology  Phone:  [phone] Fax:        [phone]   Weekly Management Note Current Dose: 10.8  Gy  Projected Dose: 63.0 Gy   Narrative:  The patient presents for routine under treatment assessment.  Port film x-rays were reviewed.  The chart was checked.  She is tolerating treatments well at this time without any side effects. She denies any further pain in the right axillary area. Patient is off antibiotics. Patient did have lancing of her sebaceous cyst requiring a break in her treatment.  Physical Findings: The lungs are clear. The heart has a regular rhythm and rate. Examination right breast area reveals no appreciable reaction at this point. The patient's nipple areolar complex area is surgically absent. Patient has mild induration at the site of her prior sebaceous cyst in the axillary region.  Impression:  The patient is tolerating radiation.  Plan:  Continue treatment as planned.   -----------------------------------  Billie Lade, PhD, MD

## 2012-06-01 ENCOUNTER — Ambulatory Visit
Admission: RE | Admit: 2012-06-01 | Discharge: 2012-06-01 | Disposition: A | Discharge status: Home / Self Care | Payer: Medicare Other | Source: Ambulatory Visit | Attending: Radiation Oncology | Admitting: Radiation Oncology

## 2012-06-02 ENCOUNTER — Ambulatory Visit
Admission: RE | Admit: 2012-06-02 | Discharge: 2012-06-02 | Disposition: A | Discharge status: Home / Self Care | Payer: Medicare Other | Source: Ambulatory Visit | Attending: Radiation Oncology | Admitting: Radiation Oncology

## 2012-06-03 ENCOUNTER — Ambulatory Visit
Admission: RE | Admit: 2012-06-03 | Discharge: 2012-06-03 | Disposition: A | Discharge status: Home / Self Care | Payer: Medicare Other | Source: Ambulatory Visit | Attending: Radiation Oncology | Admitting: Radiation Oncology

## 2012-06-06 ENCOUNTER — Ambulatory Visit
Admission: RE | Admit: 2012-06-06 | Discharge: 2012-06-06 | Disposition: A | Discharge status: Home / Self Care | Payer: Medicare Other | Source: Ambulatory Visit | Attending: Radiation Oncology | Admitting: Radiation Oncology

## 2012-06-07 ENCOUNTER — Ambulatory Visit
Admission: RE | Admit: 2012-06-07 | Discharge: 2012-06-07 | Disposition: A | Discharge status: Home / Self Care | Payer: Medicare Other | Source: Ambulatory Visit | Attending: Radiation Oncology | Admitting: Radiation Oncology

## 2012-06-07 VITALS — BP 140/78 | HR 84 | Temp 97.9°F | Wt 223.1 lb

## 2012-06-07 DIAGNOSIS — C50911 Malignant neoplasm of unspecified site of right female breast: Secondary | ICD-10-CM

## 2012-06-07 NOTE — Progress Notes (Signed)
   Department of Radiation Oncology  Phone:  [phone] Fax:        [phone]   Weekly Management Note Current Dose: 19.8  Gy  Projected Dose: 63.0  Gy   Narrative:  The patient presents for routine under treatment assessment.  Port film x-rays were reviewed.  The chart was checked.  She is tolerating treatment well. She has noticed an occasional sharp shooting pains within the breast but nothing consistent. She denies any discomfort in the axillary area where she had the I&D  Physical Findings: Weight: 223 lb 1.6 oz (101.197 kg). The lungs are clear. The heart has a regular rhythm and rate. Examination right breast reveals some mild hyperpigmentation changes.  Impression:  The patient is tolerating radiation.  Plan:  Continue treatment as planned.   -----------------------------------  Billie Lade, PhD, MD

## 2012-06-07 NOTE — Progress Notes (Signed)
Patient  Here for weekly under treat visit of right breast cancer..skin with hyperpigmentation but no peeling. Denies pain.

## 2012-06-08 ENCOUNTER — Ambulatory Visit
Admission: RE | Admit: 2012-06-08 | Discharge: 2012-06-08 | Disposition: A | Discharge status: Home / Self Care | Payer: Medicare Other | Source: Ambulatory Visit | Attending: Radiation Oncology | Admitting: Radiation Oncology

## 2012-06-09 ENCOUNTER — Ambulatory Visit
Admission: RE | Admit: 2012-06-09 | Discharge: 2012-06-09 | Disposition: A | Discharge status: Home / Self Care | Payer: Medicare Other | Source: Ambulatory Visit | Attending: Radiation Oncology | Admitting: Radiation Oncology

## 2012-06-10 ENCOUNTER — Ambulatory Visit
Admission: RE | Admit: 2012-06-10 | Discharge: 2012-06-10 | Disposition: A | Discharge status: Home / Self Care | Payer: Medicare Other | Source: Ambulatory Visit | Attending: Radiation Oncology | Admitting: Radiation Oncology

## 2012-06-13 ENCOUNTER — Ambulatory Visit
Admission: RE | Admit: 2012-06-13 | Discharge: 2012-06-13 | Disposition: A | Discharge status: Home / Self Care | Payer: Medicare Other | Source: Ambulatory Visit | Attending: Radiation Oncology | Admitting: Radiation Oncology

## 2012-06-14 ENCOUNTER — Ambulatory Visit
Admission: RE | Admit: 2012-06-14 | Discharge: 2012-06-14 | Disposition: A | Discharge status: Home / Self Care | Payer: Medicare Other | Source: Ambulatory Visit | Attending: Radiation Oncology | Admitting: Radiation Oncology

## 2012-06-14 DIAGNOSIS — C50911 Malignant neoplasm of unspecified site of right female breast: Secondary | ICD-10-CM

## 2012-06-14 NOTE — Progress Notes (Signed)
Weekly Management Note Current Dose:   28.8 Gy  Projected Dose: 63 Gy   Narrative:  The patient presents for routine under treatment assessment.  CBCT/MVCT images/Port film x-rays were reviewed.  The chart was checked. Doing well.  No complaints.  Worried about blisters under her breast. No complaints.   Physical Findings: Slightly dark breast.  Impression:  The patient is tolerating radiation.  Plan:  Continue treatment as planned. Using radiaplex.  Start cornstarch.

## 2012-06-14 NOTE — Progress Notes (Signed)
HERE TODAY FOR PUT OF RIGHT BREAST.  SKIN A LITTLE DARKER WITH NO DESQUAMATION NOTED.  NO OTHER C/O VOICED.

## 2012-06-15 ENCOUNTER — Ambulatory Visit
Admission: RE | Admit: 2012-06-15 | Discharge: 2012-06-15 | Disposition: A | Discharge status: Home / Self Care | Payer: Medicare Other | Source: Ambulatory Visit | Attending: Radiation Oncology | Admitting: Radiation Oncology

## 2012-06-16 ENCOUNTER — Ambulatory Visit
Admission: RE | Admit: 2012-06-16 | Discharge: 2012-06-16 | Disposition: A | Discharge status: Home / Self Care | Payer: Medicare Other | Source: Ambulatory Visit | Attending: Radiation Oncology | Admitting: Radiation Oncology

## 2012-06-17 ENCOUNTER — Ambulatory Visit
Admission: RE | Admit: 2012-06-17 | Discharge: 2012-06-17 | Disposition: A | Discharge status: Home / Self Care | Payer: Medicare Other | Source: Ambulatory Visit | Attending: Radiation Oncology | Admitting: Radiation Oncology

## 2012-06-20 ENCOUNTER — Ambulatory Visit
Admission: RE | Admit: 2012-06-20 | Discharge: 2012-06-20 | Disposition: A | Discharge status: Home / Self Care | Payer: Medicare Other | Source: Ambulatory Visit | Attending: Radiation Oncology | Admitting: Radiation Oncology

## 2012-06-21 ENCOUNTER — Ambulatory Visit
Admission: RE | Admit: 2012-06-21 | Discharge: 2012-06-21 | Disposition: A | Discharge status: Home / Self Care | Payer: Medicare Other | Source: Ambulatory Visit | Attending: Radiation Oncology | Admitting: Radiation Oncology

## 2012-06-21 ENCOUNTER — Encounter: Payer: Self-pay | Admitting: *Deleted

## 2012-06-21 DIAGNOSIS — C50419 Malignant neoplasm of upper-outer quadrant of unspecified female breast: Secondary | ICD-10-CM

## 2012-06-21 DIAGNOSIS — C50911 Malignant neoplasm of unspecified site of right female breast: Secondary | ICD-10-CM

## 2012-06-21 MED ORDER — RADIAPLEXRX EX GEL
Freq: Once | CUTANEOUS | Status: AC
  Administered 2012-06-21: 11:00:00 via TOPICAL

## 2012-06-21 NOTE — Progress Notes (Addendum)
ENERGY FINE TODAY, NO N/V.   WT.  222.4 LB,  SKIN .Marland KitchenMarland KitchenNOTED SOME DRY DESQUAMATION UNDER BREAST, COLOR IS DARKER.  USING RADIAPLEX.

## 2012-06-21 NOTE — Progress Notes (Signed)
   Department of Radiation Oncology  Phone:  [phone] Fax:        [phone]   Weekly Management Note Current Dose: 37.8  Gy  Projected Dose: 63.0 Gy   Narrative:  The patient presents for routine under treatment assessment.  CBCT/MVCT images/Port film x-rays were reviewed.  The chart was checked.  She is tolerating the treatments well. She has had some mild discomfort and itching within the inframammary fold.  Physical Findings: The lungs are clear. The heart has a regular rhythm and rate. Examination right breast reveals some hyperpigmentation changes and dry desquamation. The inframammary fold shows no obvious moist desquamation at this time.  Impression:  The patient is tolerating radiation.  Plan:  Continue treatment as planned.   -----------------------------------  Billie Lade, PhD, MD

## 2012-06-22 ENCOUNTER — Other Ambulatory Visit (HOSPITAL_COMMUNITY): Payer: Self-pay | Admitting: Internal Medicine

## 2012-06-22 ENCOUNTER — Ambulatory Visit
Admission: RE | Admit: 2012-06-22 | Discharge: 2012-06-22 | Disposition: A | Discharge status: Home / Self Care | Payer: Medicare Other | Source: Ambulatory Visit | Attending: Radiation Oncology | Admitting: Radiation Oncology

## 2012-06-22 DIAGNOSIS — K3189 Other diseases of stomach and duodenum: Secondary | ICD-10-CM

## 2012-06-22 DIAGNOSIS — R1013 Epigastric pain: Secondary | ICD-10-CM

## 2012-06-24 ENCOUNTER — Encounter: Payer: Self-pay | Admitting: *Deleted

## 2012-06-24 ENCOUNTER — Ambulatory Visit
Admission: RE | Admit: 2012-06-24 | Discharge: 2012-06-24 | Disposition: A | Discharge status: Home / Self Care | Payer: Medicare Other | Source: Ambulatory Visit | Attending: Radiation Oncology | Admitting: Radiation Oncology

## 2012-06-24 DIAGNOSIS — C50919 Malignant neoplasm of unspecified site of unspecified female breast: Secondary | ICD-10-CM

## 2012-06-24 MED ORDER — BIAFINE EX EMUL
Freq: Two times a day (BID) | CUTANEOUS | Status: DC
  Administered 2012-06-24: 10:00:00 via TOPICAL

## 2012-06-24 NOTE — Progress Notes (Signed)
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 She has been applying Radiaplex to right breast but c/o burning, itching. Gave pt Biafine lotion w/instructions. Pt has small area moist desquamation under right breast; advised she apply antibiotic ointment, gave her samples. Pt has been placing non-stick pads over this area to protect from bra irritation. Gave pt foam pads to place under bra as well, for comfort. Pt verbalized understanding of instructions.

## 2012-06-27 ENCOUNTER — Other Ambulatory Visit: Payer: Medicare Other | Admitting: Lab

## 2012-06-27 ENCOUNTER — Ambulatory Visit (HOSPITAL_COMMUNITY)
Admission: RE | Admit: 2012-06-27 | Discharge: 2012-06-27 | Disposition: A | Discharge status: Home / Self Care | Payer: Medicare Other | Source: Ambulatory Visit | Attending: Internal Medicine | Admitting: Internal Medicine

## 2012-06-27 ENCOUNTER — Ambulatory Visit: Payer: Medicare Other | Admitting: Oncology

## 2012-06-27 ENCOUNTER — Ambulatory Visit
Admission: RE | Admit: 2012-06-27 | Discharge: 2012-06-27 | Disposition: A | Discharge status: Home / Self Care | Payer: Medicare Other | Source: Ambulatory Visit | Attending: Radiation Oncology | Admitting: Radiation Oncology

## 2012-06-27 DIAGNOSIS — K3189 Other diseases of stomach and duodenum: Secondary | ICD-10-CM

## 2012-06-27 DIAGNOSIS — K7689 Other specified diseases of liver: Secondary | ICD-10-CM | POA: Insufficient documentation

## 2012-06-28 ENCOUNTER — Encounter: Payer: Self-pay | Admitting: Radiation Oncology

## 2012-06-28 ENCOUNTER — Ambulatory Visit: Payer: Medicare Other | Admitting: Radiation Oncology

## 2012-06-28 ENCOUNTER — Ambulatory Visit
Admission: RE | Admit: 2012-06-28 | Discharge: 2012-06-28 | Disposition: A | Discharge status: Home / Self Care | Payer: Medicare Other | Source: Ambulatory Visit | Attending: Radiation Oncology | Admitting: Radiation Oncology

## 2012-06-28 VITALS — BP 133/75 | HR 79 | Temp 98.5°F | Resp 20 | Wt 219.7 lb

## 2012-06-28 DIAGNOSIS — C50911 Malignant neoplasm of unspecified site of right female breast: Secondary | ICD-10-CM

## 2012-06-28 NOTE — Progress Notes (Signed)
Per Dr Roselind Messier, pt to been seen tomorrow for physician undertreat assessment. Pt verbalized understanding,agreement.

## 2012-06-28 NOTE — Progress Notes (Signed)
This patient was not seen by me.

## 2012-06-29 ENCOUNTER — Ambulatory Visit
Admission: RE | Admit: 2012-06-29 | Discharge: 2012-06-29 | Disposition: A | Discharge status: Home / Self Care | Payer: Medicare Other | Source: Ambulatory Visit | Attending: Radiation Oncology | Admitting: Radiation Oncology

## 2012-06-29 ENCOUNTER — Encounter: Payer: Self-pay | Admitting: Radiation Oncology

## 2012-06-29 VITALS — BP 117/77 | HR 80 | Temp 98.2°F | Resp 20 | Wt 219.1 lb

## 2012-06-29 DIAGNOSIS — C50911 Malignant neoplasm of unspecified site of right female breast: Secondary | ICD-10-CM

## 2012-06-29 NOTE — Progress Notes (Signed)
Pt applying Biafine to right breast, Neosporin to small area under breast where she has moist desquamation. Pt reports she noticed her "face is darkening". She wants to apply Biafine; informed her that would be acceptable, but to apply after radiation tx. Fatigued, "tenderness in right axilla", some loss of appetite.

## 2012-06-29 NOTE — Progress Notes (Signed)
Weekly Management Note Current Dose:  47.0 Gy  Projected Dose:63.0  Gy   Narrative:  The patient presents for routine under treatment assessment.  CBCT/MVCT images/Port film x-rays were reviewed.  The chart was checked. She is having some soreness in the low axillary area. The patient is placing Neosporin ointment in the inframammary fold region. She has started on her boost field at this time.  Physical Findings: Weight: 219 lb 1.6 oz (99.383 kg). Hyperpigmentation changes throughout the right breast. There is some minimal moist desquamation in the inframammary fold area.  Impression:  The patient is tolerating radiation.  Plan:  Continue treatment as planned.   -----------------------------------  Billie Lade, PhD, MD

## 2012-06-30 ENCOUNTER — Ambulatory Visit
Admission: RE | Admit: 2012-06-30 | Discharge: 2012-06-30 | Disposition: A | Discharge status: Home / Self Care | Payer: Medicare Other | Source: Ambulatory Visit | Attending: Radiation Oncology | Admitting: Radiation Oncology

## 2012-07-01 ENCOUNTER — Ambulatory Visit
Admission: RE | Admit: 2012-07-01 | Discharge: 2012-07-01 | Disposition: A | Discharge status: Home / Self Care | Payer: Medicare Other | Source: Ambulatory Visit | Attending: Radiation Oncology | Admitting: Radiation Oncology

## 2012-07-04 ENCOUNTER — Ambulatory Visit
Admission: RE | Admit: 2012-07-04 | Discharge: 2012-07-04 | Disposition: A | Discharge status: Home / Self Care | Payer: Medicare Other | Source: Ambulatory Visit | Attending: Radiation Oncology | Admitting: Radiation Oncology

## 2012-07-05 ENCOUNTER — Ambulatory Visit
Admission: RE | Admit: 2012-07-05 | Discharge: 2012-07-05 | Disposition: A | Discharge status: Home / Self Care | Payer: Medicare Other | Source: Ambulatory Visit | Attending: Radiation Oncology | Admitting: Radiation Oncology

## 2012-07-05 DIAGNOSIS — C50911 Malignant neoplasm of unspecified site of right female breast: Secondary | ICD-10-CM

## 2012-07-05 NOTE — Progress Notes (Signed)
HERE TODAY FOR PUT OF RIGHT BREAST.  SKIN DARK WITH SOME SMALL AREAS OF DRY DESQUAMATION UNDER ARM, UNDER BREAST AND ON CLAVICLE AREA; USING BIAFINE.

## 2012-07-05 NOTE — Progress Notes (Signed)
Weekly Management Note Current Dose: 55.0  Gy  Projected Dose: 63.0 Gy   Narrative:  The patient presents for routine under treatment assessment.  CBCT/MVCT images/Port film x-rays were reviewed.  The chart was checked. She doing well this week with minimal discomfort and dryness within the breast. She continues to use Biafine as well as Neosporin for areas of  moist desquamation  Physical Findings: The lungs are clear. The heart has regular rhythm and rate. Patient has small areas of moist desquamation along the right breast area in the inframammary fold and upper outer aspect of the breast. In the remainder of the breast there is some dry desquamation and hyperpigmentation changes.  Impression:  The patient is tolerating radiation.  Plan:  Continue treatment as planned.

## 2012-07-06 ENCOUNTER — Ambulatory Visit
Admission: RE | Admit: 2012-07-06 | Discharge: 2012-07-06 | Disposition: A | Discharge status: Home / Self Care | Payer: Medicare Other | Source: Ambulatory Visit | Attending: Radiation Oncology | Admitting: Radiation Oncology

## 2012-07-07 ENCOUNTER — Ambulatory Visit: Payer: Medicare Other

## 2012-07-07 ENCOUNTER — Ambulatory Visit
Admission: RE | Admit: 2012-07-07 | Discharge: 2012-07-07 | Disposition: A | Discharge status: Home / Self Care | Payer: Medicare Other | Source: Ambulatory Visit | Attending: Radiation Oncology | Admitting: Radiation Oncology

## 2012-07-08 ENCOUNTER — Ambulatory Visit: Payer: Medicare Other

## 2012-07-08 ENCOUNTER — Ambulatory Visit
Admission: RE | Admit: 2012-07-08 | Discharge: 2012-07-08 | Disposition: A | Discharge status: Home / Self Care | Payer: Medicare Other | Source: Ambulatory Visit | Attending: Radiation Oncology | Admitting: Radiation Oncology

## 2012-07-11 ENCOUNTER — Ambulatory Visit
Admission: RE | Admit: 2012-07-11 | Discharge: 2012-07-11 | Disposition: A | Discharge status: Home / Self Care | Payer: Medicare Other | Source: Ambulatory Visit | Attending: Radiation Oncology | Admitting: Radiation Oncology

## 2012-07-11 ENCOUNTER — Ambulatory Visit (INDEPENDENT_AMBULATORY_CARE_PROVIDER_SITE_OTHER): Payer: Medicare Other | Admitting: General Surgery

## 2012-07-11 ENCOUNTER — Ambulatory Visit (HOSPITAL_BASED_OUTPATIENT_CLINIC_OR_DEPARTMENT_OTHER)
Admission: RE | Admit: 2012-07-11 | Discharge: 2012-07-11 | Disposition: A | Discharge status: Home / Self Care | Payer: Medicare Other | Source: Ambulatory Visit | Attending: Radiation Oncology | Admitting: Radiation Oncology

## 2012-07-11 ENCOUNTER — Encounter (INDEPENDENT_AMBULATORY_CARE_PROVIDER_SITE_OTHER): Payer: Self-pay | Admitting: General Surgery

## 2012-07-11 ENCOUNTER — Encounter: Payer: Self-pay | Admitting: Radiation Oncology

## 2012-07-11 ENCOUNTER — Ambulatory Visit: Payer: Medicare Other

## 2012-07-11 VITALS — BP 129/76 | HR 91 | Temp 96.8°F | Ht 65.5 in | Wt 222.0 lb

## 2012-07-11 VITALS — BP 136/81 | HR 82 | Temp 97.9°F | Wt 222.4 lb

## 2012-07-11 DIAGNOSIS — C50911 Malignant neoplasm of unspecified site of right female breast: Secondary | ICD-10-CM

## 2012-07-11 DIAGNOSIS — C50419 Malignant neoplasm of upper-outer quadrant of unspecified female breast: Secondary | ICD-10-CM

## 2012-07-11 MED ORDER — BIAFINE EX EMUL
CUTANEOUS | Status: DC | PRN
  Administered 2012-07-11: 1 via TOPICAL

## 2012-07-11 NOTE — Progress Notes (Signed)
°  Radiation Oncology         [phone] ________________________________  Name: [name] MRN: [id number]  Date: 06/28/2012  DOB: Aug 29, 1941  Simulation Verification Note  Status: outpatient  NARRATIVE: The patient was brought to the treatment unit and placed in the planned treatment position. The clinical setup was verified. Then port films were obtained and uploaded to the radiation oncology medical record software.  The treatment beams were carefully compared against the planned radiation fields. The position location and shape of the radiation fields was reviewed. They targeted volume of tissue appears to be appropriately covered by the radiation beams. Organs at risk appear to be excluded as planned.  Based on my personal review, I approved the simulation verification. The patient's treatment will proceed as planned.  -----------------------------------  Billie Lade, PhD, MD

## 2012-07-11 NOTE — Progress Notes (Signed)
  Radiation Oncology         [phone] ________________________________  Name: [name] MRN: [id number]  Date: 05/02/2012  DOB: 11-02-1941  Simulation Verification Note  Status: outpatient  NARRATIVE: The patient fields were opened at the treatment unit and placed in the planned treatment position. The clinical setup was verified. Then port films were obtained and uploaded to the radiation oncology medical record software.  The treatment beams were carefully compared against the planned radiation fields. The position location and shape of the radiation fields was reviewed. The targeted volume of tissue appears to be appropriately covered by the radiation beams. Organs at risk appear to be excluded as planned.  Based on my personal review, I approved the simulation verification. The patient's treatment will proceed as planned.  ------------------------------------------------  Artist Pais Kathrynn Running, M.D.

## 2012-07-11 NOTE — Progress Notes (Signed)
   Department of Radiation Oncology  Phone:  [phone] Fax:        [phone]   Weekly Management Note Current Dose:63.0 Gy  Projected Dose: 63.0 Gy  Narrative:  The patient presents for routine under treatment assessment.  CBCT/MVCT images/Port film x-rays were reviewed.  The chart was checked.  She completed her last radiation treatment earlier today. Patient denies any significant itching or discomfort in the breast area. She has minimal fatigue. She is happy to complete her treatment today.  Physical Findings: Weight: 222 lb 6.4 oz (100.88 kg). Lungs are clear. The heart has a regular rhythm and rate. Examination right breast area reveals areas of dry desquamation and hyperpigmentation no moist desquamation at this point.  Impression:  The patient has completed her radiation therapy.  Plan:  Routine followup in one month.

## 2012-07-11 NOTE — Progress Notes (Signed)
Subjective:     Patient ID: [name], female   DOB: 1941/07/14, 71 y.o.   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 Biopsy of the areola was positive. Port-A-Cath was inserted she underwent neoadjuvant chemotherapy.  On 04/01/2012 she underwent right partial mastectomy and sentinel node biopsy. This required a fairly generous central lumpectomy including the nipple areolar complex. She did well from that surgery. Final pathologic diagnosis was ypT4, ypN0.  She thinks that she has completed her chemotherapy. The radiation therapy was completed just today.  She was seen for an abscess in her right axilla in May of this year but that has healed following drainage and doxycycline.  She is scheduled to see Dr. Welton Flakes in August.  Review of Systems     Objective:   Physical Exam The patient looks well. She is in no distress.  Neck radiation changes in the right neck and right supraclavicular area are noted. No adenopathy.  Chest lungs clear to auscultation. Port left infraclavicular area looks fine.  Breasts right breast reveals extreme tanning but no desquamation. No infection. Minimal tenderness. Incisions are well healed. No fluid collection.    Assessment:     Invasive ductal carcinoma right breast, upper outer quadrant and involving nipple and areola. Status post neoadjuvant therapy.   Healing uneventfully following right partial mastectomy and sentinel node biopsy. The patient is satisfied with her breast for now and states that her bra fits well. No interest in reconstructive issues at this time.   Final pathology ypT4, ypN0, negative margins, 1.9 cm tumor, negative sentinel nodes.   S/p PAC insertion, no problems with port  Postop abscess right axilla, resolved     Plan:     I advised this patient to call me when Dr. Welton Flakes decides that the Port-A-Cath can be removed, and we  will set that up as a minor procedure  She will see Dr. Welton Flakes in August of this years for followup  Will schedule bilateral diagnostic mammograms in November 2013.  Followup with me in the office December 2013.    Angelia Mould. Derrell Lolling, M.D., [facility] General and Minimally invasive Surgery Breast and Colorectal Surgery Office:   [phone] Pager:   [phone]

## 2012-07-11 NOTE — Progress Notes (Incomplete)
°  Radiation Oncology         [phone] ________________________________  Name: [name] MRN: [id number]  Date: 07/11/2012  DOB: March 01, 1941  End of Treatment Note  Diagnosis:  ypT4, pNo, pMx right breast cancer  Indication for treatment:  ***       Radiation treatment dates:   05/03/2012-07/11/2012  Site/dose:   ***  Beams/energy:   ***  Narrative: The patient tolerated radiation treatment relatively well.   ***  Plan: The patient has completed radiation treatment. The patient will return to radiation oncology clinic for routine followup in one month. I advised them to call or return sooner if they have any questions or concerns related to their recovery or treatment.  -----------------------------------  Billie Lade, PhD, MD

## 2012-07-11 NOTE — Patient Instructions (Signed)
Your physical exam shows no evidence of cancer. Skin changes from your radiation therapy are obvious, but there are no obvious complications of the radiation therapy.. The Port-A-Cath site looks normal.  Please call me when Dr. Welton Flakes decides that the Port-A-Cath can be removed, and we will arrange for that to be done as a minor procedure.  We will schedule you for bilateral diagnostic mammograms in November of 2013.  Return to see Dr. Derrell Lolling in December 2013.

## 2012-07-11 NOTE — Progress Notes (Signed)
Patient completes treatment today.Completes 34 right breast treatments. Skin very dark with dry patches. Denies pain.Will continue application of biafine 2 to 3 times daily. To follow up in one month.

## 2012-07-21 ENCOUNTER — Telehealth: Payer: Self-pay | Admitting: *Deleted

## 2012-07-21 ENCOUNTER — Ambulatory Visit (HOSPITAL_BASED_OUTPATIENT_CLINIC_OR_DEPARTMENT_OTHER): Payer: Medicare Other | Admitting: Oncology

## 2012-07-21 ENCOUNTER — Ambulatory Visit: Payer: Medicare Other | Admitting: Lab

## 2012-07-21 ENCOUNTER — Encounter: Payer: Self-pay | Admitting: Oncology

## 2012-07-21 VITALS — BP 139/80 | HR 88 | Temp 98.5°F | Ht 65.5 in | Wt 221.5 lb

## 2012-07-21 DIAGNOSIS — C50919 Malignant neoplasm of unspecified site of unspecified female breast: Secondary | ICD-10-CM

## 2012-07-21 DIAGNOSIS — Z170 Estrogen receptor positive status [ER+]: Secondary | ICD-10-CM

## 2012-07-21 DIAGNOSIS — C50911 Malignant neoplasm of unspecified site of right female breast: Secondary | ICD-10-CM

## 2012-07-21 LAB — CBC WITH DIFFERENTIAL/PLATELET
Basophils Absolute: 0 10*3/uL (ref 0.0–0.1)
EOS%: 2.4 % (ref 0.0–7.0)
Eosinophils Absolute: 0.1 10*3/uL (ref 0.0–0.5)
HGB: 13.3 g/dL (ref 11.6–15.9)
NEUT#: 1.9 10*3/uL (ref 1.5–6.5)
RBC: 4.63 10*6/uL (ref 3.70–5.45)
RDW: 16.4 % — ABNORMAL HIGH (ref 11.2–14.5)
lymph#: 0.7 10*3/uL — ABNORMAL LOW (ref 0.9–3.3)

## 2012-07-21 LAB — COMPREHENSIVE METABOLIC PANEL
AST: 15 U/L (ref 0–37)
Albumin: 4.2 g/dL (ref 3.5–5.2)
BUN: 17 mg/dL (ref 6–23)
Calcium: 10.4 mg/dL (ref 8.4–10.5)
Chloride: 101 mEq/L (ref 96–112)
Glucose, Bld: 104 mg/dL — ABNORMAL HIGH (ref 70–99)
Potassium: 3.9 mEq/L (ref 3.5–5.3)
Sodium: 141 mEq/L (ref 135–145)
Total Protein: 6.8 g/dL (ref 6.0–8.3)

## 2012-07-21 MED ORDER — LETROZOLE 2.5 MG PO TABS: 2.5000 mg | ORAL_TABLET | Freq: Every day | ORAL | Status: AC

## 2012-07-21 NOTE — Telephone Encounter (Signed)
Gave patient appointment for 10-24-2012 called dr.ingram office to inform them the order is in the system for getting the patient's port a cath removed

## 2012-07-21 NOTE — Patient Instructions (Addendum)
1. You are doing well, now you will go on the anti-estrogen therapy to help prevent the breast cancer from coming back. Your drug is Letrozole (femara)  Letrozole tablets What is this medicine? LETROZOLE (LET roe zole) blocks the production of estrogen. Certain types of breast cancer grow under the influence of estrogen. Letrozole helps block tumor growth. This medicine is used to treat advanced breast cancer in postmenopausal women. This medicine may be used for other purposes; ask your health care provider or pharmacist if you have questions. What should I tell my health care provider before I take this medicine? They need to know if you have any of these conditions: -liver disease -osteoporosis (weak bones) -an unusual or allergic reaction to letrozole, other medicines, foods, dyes, or preservatives -pregnant or trying to get pregnant -breast-feeding How should I use this medicine? Take this medicine by mouth with a glass of water. You may take it with or without food. Follow the directions on the prescription label. Take your medicine at regular intervals. Do not take your medicine more often than directed. Do not stop taking except on your doctor's advice. Talk to your pediatrician regarding the use of this medicine in children. Special care may be needed. Overdosage: If you think you have taken too much of this medicine contact a poison control center or emergency room at once. NOTE: This medicine is only for you. Do not share this medicine with others. What if I miss a dose? If you miss a dose, take it as soon as you can. If it is almost time for your next dose, take only that dose. Do not take double or extra doses. What may interact with this medicine? Do not take this medicine with any of the following medications: -estrogens, like hormone replacement therapy or birth control pills This medicine may also interact with the following medications: -dietary supplements such as  androstenedione or DHEA -prasterone -tamoxifen This list may not describe all possible interactions. Give your health care provider a list of all the medicines, herbs, non-prescription drugs, or dietary supplements you use. Also tell them if you smoke, drink alcohol, or use illegal drugs. Some items may interact with your medicine. What should I watch for while using this medicine? Visit your doctor or health care professional for regular check-ups to monitor your condition. Do not use this drug if you are pregnant. Serious side effects to an unborn child are possible. Talk to your doctor or pharmacist for more information. You may get drowsy or dizzy. Do not drive, use machinery, or do anything that needs mental alertness until you know how this medicine affects you. Do not stand or sit up quickly, especially if you are an older patient. This reduces the risk of dizzy or fainting spells. What side effects may I notice from receiving this medicine? Side effects that you should report to your doctor or health care professional as soon as possible: -allergic reactions like skin rash, itching, or hives -bone fracture -chest pain -difficulty breathing or shortness of breath -severe pain, swelling, warmth in the leg -unusually weak or tired -vaginal bleeding Side effects that usually do not require medical attention (report to your doctor or health care professional if they continue or are bothersome): -bone, back, joint, or muscle pain -dizziness -fatigue -fluid retention -headache -hot flashes, night sweats -nausea -weight gain This list may not describe all possible side effects. Call your doctor for medical advice about side effects. You may report side effects to FDA at  1-800-FDA-1088. Where should I keep my medicine? Keep out of the reach of children. Store between 15 and 30 degrees C (59 and 86 degrees F). Throw away any unused medicine after the expiration date. NOTE: This sheet is a  summary. It may not cover all possible information. If you have questions about this medicine, talk to your doctor, pharmacist, or health care provider.  2012, Elsevier/Gold Standard. (02/17/2008 4:43:44 PM)  2. Your pathology is below:  ADDITIONAL INFORMATION: 1. PROGNOSTIC INDICATORS - ACIS Results IMMUNOHISTOCHEMICAL AND MORPHOMETRIC ANALYSIS BY THE AUTOMATED CELLULAR IMAGING SYSTEM (ACIS) Estrogen Receptor (Negative, <1%): 90%, STRONG (MANUALLY) STAINING INTENSITY Progesterone Receptor (Negative, <1%): 34%, MODERATE STAINING INTENSITY All controls stained appropriately Jasmine Leisure MD Pathologist, Electronic Signature ( Signed 04/10/2012) 1. CHROMOGENIC IN-SITU HYBRIDIZATION Interpretation HER-2/NEU BY CISH - NO AMPLIFICATION OF HER-2 DETECTED. THE RATIO OF HER-2: CEP 17 SIGNALS WAS 1.03. Reference range: Ratio: HER2:CEP17 < 1.8 - gene amplification not observed Ratio: HER2:CEP 17 1.8-2.2 - equivocal result Ratio: HER2:CEP17 > 2.2 - gene amplification observed Jasmine Leisure MD Pathologist, Electronic Signature ( Signed 04/10/2012) 1 of 4 FINAL for [name], [name] ([id number]) FINAL DIAGNOSIS Diagnosis 1. Breast, lumpectomy, Central right - INVASIVE DUCTAL CARCINOMA (1.9 CM), SEE COMMENT. - INVASIVE TUMOR IS 0.5 CM FROM NEAREST MARGIN (SUPERIOR MARGIN) - LYMPHOVASCULAR AND PERINEURAL INVASION PRESENT. - TUMOR INVOLVES SKIN/NIPPLE - TUMOR INVOLVES NIPPLE DERMAL LYMPHATICS - DUCTAL CARCINOMA IN SITU - SEE TUMOR SYNOPTIC TEMPLATE BELOW 2. Lymph node, sentinel, biopsy, Right axillary - ONE LYMPH NODE, NEGATIVE FOR TUMOR (0/1) 3. Lymph node, sentinel, biopsy, Right axillary #2 - ONE LYMPH NODE, NEGATIVE FOR TUMOR (0/1) Microscopic Comment 1. BREAST, INVASIVE TUMOR, WITH LYMPH NODE SAMPLING Specimen, including laterality: Right breast Procedure: Lumpectomy Grade: II of III Tubule formation: 3 Nuclear pleomorphism: 2 Mitotic: 1 Tumor size (gross measurement): 1.9  cm Margins: Invasive, distance to closest margin: 0.5 cm In-situ, distance to closest margin: 0.5 cm If margin positive, focally or broadly: N/A Lymphovascular invasion: Present Ductal carcinoma in situ: Present Grade: II of III Extensive intraductal component: Absent Lobular neoplasia: Absent Tumor focality: Unifocal Treatment effect: Present If present, treatment effect in breast tissue, lymph nodes or both: Breast tissue and lymph nodes. Extent of tumor: Skin: Positive for tumor Nipple: Positive for tumor Skeletal muscle: N/A Lymph nodes: # examined: 2 number positive: 0 Breast prognostic profile: Estrogen receptor: Repeated, previous study demonstrated 100% positivity ([id number]) Progesterone receptor: Repeated, previous study demonstrated 3% positivity ([id number]) Her 2 neu: Repeated, previous study demonstrated no amplification ([id number]). Ki-67: Not repeated, previous study demonstrated 38% proliferation rate ([id number]). Non-neoplastic breast: Neoadjuvant related change and calcifications. Previous biopsy site identified. TNM: ypT4, pN0, pMX Comments: None (CR:kh 04-05-12) [place] RUND DO Pathologist, Electronic Signature (Case signed 04/05/2012) 2 of 4 FINAL for [name], [name] ([id number]) Specimen Gross and Clinical Information Specimen(s) Obtained: 1. Breast, lumpectomy, Central right 2. Lymph node, sentinel, biopsy, Right axillary 3. Lymph node, sentinel, biopsy, Right axillary #2 Specimen Clinical Information 1. Cancer right breast (tl) Gross 1. Specimen type: Right breast lumpectomy, central, which includes overlying skin with nipple and areola. Size: 14 x 12 x 4 cm. Orientation: The portion of skin is 12 x 5 cm with a 5 cm in diameter unremarkable nipple and surrounding areola. The specimen was also previously inked as follows: Blue inferior, orange lateral, yellow medial, black posterior, red superior. Localized area: There is an inserted wire and one  needle which localizes a clip on specimen radiograph. Cut surface: On sectioning through the  localized area, there is a 1.9 x 1.7 x 1.5 cm gray white to hyperemic indurated, nodular area of fibrous tissue with a centrally embedded ribbon clip. The remaining specimen consists predominantly of soft fatty tissue and a small amount of gray white, soft to rubbery fibrous tissue. Sections through the nipple are unremarkable. Margins: The area of localized tissue containing the clip is 0.5 cm from the superior margin and greater than 1 cm from all other margins. Prognostic indicators: Not taken at time of gross. Block summary: A - D = area of tissue containing clip. E - I = superior margin nearest area of tissue containing clip. J - L = posterior margin nearest localized tissue. M = inferior margin nearest localized tissue. N = medial margin nearest localized tissue. O = lateral margin nearest localized tissue. P - R = tissue between area of localized tissue and nipple. S = nipple and underlying tissue. Total = 19 blocks. 2. Rapid Intraoperative Consult performed (Yes or No): No. Specimen: Right axillary sentinel node. Number and size: One node, 1.7 cm. Cut Surface(s): Tan white to yellow red, soft to rubbery, focally blue. Block Summary: The node is bisected and submitted in one block labeled SLN for routine histology. 3. Rapid Intraoperative Consult performed (Yes or No): No. Specimen: Right axillary sentinel node. Number and size: One node, 0.7 cm. Cut Surface(s): Tan pink, soft, non-blue. Block Summary: The node is bisected and submitted in one block labeled SLN for routine histology. (SW:eps 04/01/12) Stain(s) used in Diagnosis: The following stain(s) were used in diagnosing the case: ER-ACIS, PR-ACIS, CISH. The control(s) stained appropriately. Disclaimer PR progesterone receptor (PgR 636), immunohistochemical stains are performed on formalin fixed, paraffin embedded tissue using a  3,3"-diaminobenzidine (DAB) chromogen and DAKO Autostainer System. The staining intensity of the nucleus is scored morphometrically using the Automated Cellular Imaging System (ACIS) and is reported as the percentage of tumor cell nuclei demonstrating specific nuclear staining. 3 of 4 FINAL for [name], [name] ([id number]) Disclaimer(continued) Estrogen receptor (SP1), immunohistochemical stains are performed on formalin fixed, paraffin embedded tissue using a 3,3"-diaminobenzidine (DAB) chromogen and DAKO Autostainer System. The staining intensity of the nucleus is scored morphometrically using the Automated Cellular Imaging System (ACIS) and is reported as the percentage of tumor cell nuclei demonstrating specific nuclear staining. Her2Neu by CISH (chromogenic in-situ hybridization) is performed at [facility], using the Her2CISH pharmDx Kit (code number [id number]) Report signed out from the following location(s) [facility] [address] [place], [place], [place] [address]. CLIA #: [id number], 4 of   3. Port removal by Dr. Derrell Lolling  4. I will see you back in 3 months

## 2012-07-21 NOTE — Progress Notes (Signed)
OFFICE PROGRESS NOTE  CC Jasmine Kelp, MD Jasmine Kayser, MD [address] [place] [address]  DIAGNOSIS: 71 year old female with invasive ductal carcinoma of the right breast involving the right nipple diagnosed November 2012   PRIOR THERAPY:   #1 status post ultrasound-guided core biopsy of the mass at the 11:00 position 3 cm from the right nipple. The pathology revealed invasive mammary carcinoma with carcinoma in situ grade 2 tumor was estrogen receptor +100% progesterone receptor +3% proliferation marker 38% HER-2/neu negative.  #2 MRI of the breast showed 2.5 x 2.4 x 1.8 cm spiculated enhancing mass in the anterior aspect of the upper outer quadrant of the right breast with smaller linear and nodular enhancing masses extending to the biopsied mass to the right nipple.  #3 patient is status post punch biopsy of the right nipple with the pathology revealing an invasive mammary carcinoma as well.  #4 S/P 5 cycles of TC last given on 02/24/12  #5. Post chemotherapy MRI on 02/29/12 with decrease in size of the breast mass.  #6 patient is now status post right  lumpectomy with sentinel node biopsy. The final pathology revealed an invasive ductal carcinoma with lymphovascular space invasion tumor measured 1.9 cm 2 sentinel nodes were negative for metastatic disease ER positive PR positive HER-2/neu negative.  #7 patient is now status post radiation therapy to the right breast overall she tolerated it well. Her treatment ended on 07/11/2012.  #8 a she will begin adjuvant antiestrogen therapy with letrozole 2.5 mg daily starting 07/21/2012.  CURRENT THERAPY: . Letrozole 2.5 mg daily  INTERVAL HISTORY: [name] 71 y.o. female returns for followup visit today. Overall patient is doing well she tolerated the radiation quite nicely. She has significant darkening of the skin she does have some itching now. She otherwise denies any fevers chills night sweats headaches she has no  shortness of breath no chest pains no palpitations she does have some fatigue. She is denying any peripheral paresthesias. She has no bleeding. She has no aches or pains. Remainder of the 10 point review of systems is unremarkable.  MEDICAL HISTORY: Past Medical History  Diagnosis Date  . GERD (gastroesophageal reflux disease)   . Asthma     daily and prn inhalers  . Grave's disease     no current meds.  . Arthritis     osteo - s/p right total knee  . Spondylosis     cervical and lumbar  . Hyperlipidemia   . Breast cancer 10/28/11    right breast  . Hypertension     under control with med.  . Sleep apnea sleep study 09/18/2005    uses CPAP occ. - to bring machine DOS  . S/P chemotherapy, time since 4-12 weeks finished 02/20/2011  . Diabetes mellitus     diet-controlled  . History of chemotherapy     neoadjuvant, last dose 02/24/12  . Boil, axilla     ALLERGIES:  is allergic to lactose intolerance (gi); lipitor; and zocor.  MEDICATIONS:  Current Outpatient Prescriptions  Medication Sig Dispense Refill  . albuterol (PROVENTIL HFA;VENTOLIN HFA) 108 (90 BASE) MCG/ACT inhaler Inhale 2 puffs into the lungs as needed.        Marland Kitchen aspirin 81 MG tablet Take 81 mg by mouth daily.       . cyclobenzaprine (FLEXERIL) 10 MG tablet Take 5 mg by mouth as needed. For muscle spasms      . diltiazem (TIAZAC) 240 MG 24 hr capsule       .  emollient (BIAFINE) cream Apply topically 2 (two) times daily.      Marland Kitchen ezetimibe (ZETIA) 10 MG tablet Take 10 mg by mouth daily. AM      . Fluticasone-Salmeterol (ADVAIR) 100-50 MCG/DOSE AEPB Inhale 1 puff into the lungs every 12 (twelve) hours.       . hyaluronate sodium (RADIAPLEXRX) GEL Apply 1 application topically 2 (two) times daily. 2nd tube given, applys to skin area bid,after rad tx and bedtime, and prn but not 3 hours prior to rad tx      . HYDROcodone-acetaminophen (VICODIN) 5-500 MG per tablet Take 1 tablet by mouth every 6 (six) hours as needed.      .  lansoprazole (PREVACID) 30 MG capsule Take 30 mg by mouth 2 (two) times daily.       . meloxicam (MOBIC) 7.5 MG tablet Take 5 mg by mouth as needed. For knee tightness      . rosuvastatin (CRESTOR) 5 MG tablet Take 5 mg by mouth daily. AM      . traMADol (ULTRAM) 50 MG tablet Take 50 mg by mouth every 6 (six) hours as needed.      . triamterene-hydrochlorothiazide (MAXZIDE-25) 37.5-25 MG per tablet Take 1 tablet by mouth daily. AM        SURGICAL HISTORY:  Past Surgical History  Procedure Date  . Lumbar laminectomy     back surg. x 2 - 1980's and 1999  . Total knee arthroplasty 07/28/2006    right  . Cataract extraction, bilateral   . Bilateral oophorectomy 2009  . Partial hysterectomy     BSO  . Cardiac catheterization 04/10/2010, 05/07/2004    LAD lesion, blood flow OK, per pt.  . Portacath placement 11/27/2011    Procedure: INSERTION PORT-A-CATH;  Surgeon: Ernestene Mention, MD;  Location: [facility];  Service: General;  Laterality: Right;  . Lesion removal 11/27/2011    Procedure: LESION REMOVAL;  Surgeon: Ernestene Mention, MD;  Location: [facility];  Service: General;  Laterality: Right;  Skin tag removed from under right eye. No specimen  . Abdominal hysterectomy   . Carpal tunnel release     left  . Mastectomy partial / lumpectomy 04/01/12    right breast, inv ductal ca, ER/PR  +, HER 2 -   . Breast surgery         ECOG PERFORMANCE STATUS: 0 - Asymptomatic  Blood pressure 139/80, pulse 88, temperature 98.5 F (36.9 C), temperature source Oral, height 5' 5.5" (1.664 m), weight 221 lb 8 oz (100.472 kg). Physical examination:   HEENT: EOMI PERRLA sclerae anicteric no conjunctival pallor oral mucosa is moist neck is supple NO palpable adenopathy LUNGS: are clear bilaterally to auscultation and percussion CVS: is regular rate rhythm no murmurs gallops or rubs GI: abdomen is soft nontender nondistended bowel sounds are present no hepatosplenomegaly   EXTREMITIES: extremities no clubbing edema or cyanosis CNS: neuro patient's alert oriented x3 strength is symmetrical in upper and lower extremities. Breast examination left breast no masses nipple discharge right breast no masses nipple discharge no skin changes   LABORATORY DATA: Lab Results  Component Value Date   WBC 3.0* 07/21/2012   HGB 13.3 07/21/2012   HCT 39.6 07/21/2012   MCV 85.7 07/21/2012   PLT 260 07/21/2012      Chemistry      Component Value Date/Time   NA 138 03/31/2012 0910   K 4.3 03/31/2012 0910   CL 101 03/31/2012 0910  CO2 27 03/31/2012 0910   BUN 16 03/31/2012 0910   CREATININE 0.82 03/31/2012 0910      Component Value Date/Time   CALCIUM 9.5 03/31/2012 0910   ALKPHOS 73 03/31/2012 0910   AST 16 03/31/2012 0910   ALT 14 03/31/2012 0910   BILITOT 0.3 03/31/2012 0910     ADDITIONAL INFORMATION: 1. PROGNOSTIC INDICATORS - ACIS Results IMMUNOHISTOCHEMICAL AND MORPHOMETRIC ANALYSIS BY THE AUTOMATED CELLULAR IMAGING SYSTEM (ACIS) Estrogen Receptor (Negative, <1%): 90%, STRONG (MANUALLY) STAINING INTENSITY Progesterone Receptor (Negative, <1%): 34%, MODERATE STAINING INTENSITY All controls stained appropriately Pecola Leisure MD Pathologist, Electronic Signature ( Signed 04/10/2012) 1. CHROMOGENIC IN-SITU HYBRIDIZATION Interpretation HER-2/NEU BY CISH - NO AMPLIFICATION OF HER-2 DETECTED. THE RATIO OF HER-2: CEP 17 SIGNALS WAS 1.03. Reference range: Ratio: HER2:CEP17 < 1.8 - gene amplification not observed Ratio: HER2:CEP 17 1.8-2.2 - equivocal result Ratio: HER2:CEP17 > 2.2 - gene amplification observed Pecola Leisure MD Pathologist, Electronic Signature ( Signed 04/10/2012) 1 of 4 FINAL for PETRITA, BLUNCK ([id number]) FINAL DIAGNOSIS Diagnosis 1. Breast, lumpectomy, Central right - INVASIVE DUCTAL CARCINOMA (1.9 CM), SEE COMMENT. - INVASIVE TUMOR IS 0.5 CM FROM NEAREST MARGIN (SUPERIOR MARGIN) - LYMPHOVASCULAR AND PERINEURAL INVASION PRESENT. - TUMOR INVOLVES  SKIN/NIPPLE - TUMOR INVOLVES NIPPLE DERMAL LYMPHATICS - DUCTAL CARCINOMA IN SITU - SEE TUMOR SYNOPTIC TEMPLATE BELOW 2. Lymph node, sentinel, biopsy, Right axillary - ONE LYMPH NODE, NEGATIVE FOR TUMOR (0/1) 3. Lymph node, sentinel, biopsy, Right axillary #2 - ONE LYMPH NODE, NEGATIVE FOR TUMOR (0/1) Microscopic Comment 1. BREAST, INVASIVE TUMOR, WITH LYMPH NODE SAMPLING Specimen, including laterality: Right breast Procedure: Lumpectomy Grade: II of III Tubule formation: 3 Nuclear pleomorphism: 2 Mitotic: 1 Tumor size (gross measurement): 1.9 cm Margins: Invasive, distance to closest margin: 0.5 cm In-situ, distance to closest margin: 0.5 cm If margin positive, focally or broadly: N/A Lymphovascular invasion: Present Ductal carcinoma in situ: Present Grade: II of III Extensive intraductal component: Absent Lobular neoplasia: Absent Tumor focality: Unifocal Treatment effect: Present If present, treatment effect in breast tissue, lymph nodes or both: Breast tissue and lymph nodes. Extent of tumor: Skin: Positive for tumor Nipple: Positive for tumor Skeletal muscle: N/A Lymph nodes: # examined: 2 number positive: 0 Breast prognostic profile: Estrogen receptor: Repeated, previous study demonstrated 100% positivity ([id number]) Progesterone receptor: Repeated, previous study demonstrated 3% positivity ([id number]) Her 2 neu: Repeated, previous study demonstrated no amplification ([id number]). Ki-67: Not repeated, previous study demonstrated 38% proliferation rate ([id number]). Non-neoplastic breast: Neoadjuvant related change and calcifications. Previous biopsy site identified. TNM: ypT4, pN0, pMX Comments: None (CR:kh 04-05-12) [place] RUND DO Pathologist, Electronic Signature (Case signed 04/05/2012) 2 of 4   ASSESSMENT: 71 year old female with 1.  7.5 cm mass in the right breast with positive skin biopsy at the nipple of the right breast. Patient's tumor is ER  positive PR positive HER-2/neu negative.   #2 s/p 5 cycles of neoadjuvant chemotherapy consisting of taxotere and cytoxan. Last cycle given on 02/24/12  #3 S/P right breast lumpectomy with sentinel node biopsy The final pathology revealed a 1.9 cm residual disease 2 sentinel nodes were negative for metastatic disease. Final staging is ypT4N0. Tumor is estrogen receptor positive progesterone receptor positive HER-2/neu negative.  #4 patient is now status post radiation therapy to the right breast completed on 07/11/2012.  #5 because her tumor was question receptor positive progesterone receptor positive she will now begin antiestrogen therapy with letrozole 2.5 mg daily. Risks and benefits of this treatment were discussed with the patient and information  has been disseminated to her. She demonstrated understanding. I also gave her a copy of her pathology report today.  PLAN: #1Patient will begin letrozole 2.5 mg daily. A total of 5 years of therapy is planned presently. Risks and benefits of treatment were discussed with her. Patient has  had a bone density scan performed by her primary care physician Dr. Guillermina City. She has also  on vitamin D And calcium.  #2 we discussed exercise weight loss healthy diet as well as a way of her reducing her risk of recurrence.  #3 patient will also have her Port-A-Cath removed and a referral to Dr. Derrell Lolling has been made.  #4 I will plan on seeing the patient back in 3 months time in followup or sooner if she starts having side effects from her antiestrogen therapy.   All questions were answered. The patient knows to call the clinic with any problems, questions or concerns. We can certainly see the patient much sooner if necessary.  I spent 25 minutes counseling the patient and coordinating care with a 30 minute appointment.    Drue Second, MD Medical/Oncology [facility] [phone] (beeper) [phone] (Office)  07/21/2012, 11:24 AM

## 2012-07-22 ENCOUNTER — Telehealth (INDEPENDENT_AMBULATORY_CARE_PROVIDER_SITE_OTHER): Payer: Self-pay | Admitting: General Surgery

## 2012-07-22 NOTE — Telephone Encounter (Signed)
Called patient and left message on both the home and cell phone asking for return call. Patient called back before completion of entry and advised that she agrees to have the port-a-cath removed under local anesthesia. Advised patient I would forward the information to Dr. Derrell Lolling to advise.

## 2012-07-22 NOTE — Telephone Encounter (Signed)
Left message in relation to previous call with patient. Left message advising of appointment made and for her to call back to confirm the appointment or to request change if it does not work for her.  Appointment made for 08/01/12 at 3:00.

## 2012-07-24 ENCOUNTER — Encounter: Payer: Self-pay | Admitting: Radiation Oncology

## 2012-07-24 NOTE — Progress Notes (Signed)
   Department of Radiation Oncology  Phone:  [phone] Fax:        [phone]   Simulation note  On July 3 the patient underwent additional planning for radiation therapy directed at the right breast area the patient's treatment planning CT scan was reviewed and she  had set up of a boost field directed at the site of presentation within the central aspect of the breast area. This will be treated with an AP and right posterior oblique field using 18 in the photons. A computerized isodose plan is requested for treatment.  -----------------------------------  Billie Lade, PhD, MD

## 2012-07-24 NOTE — Progress Notes (Signed)
  Radiation Oncology         [phone] ________________________________  Name: [name] MRN: [id number]  Date: 07/21/2012  DOB: 1941-07-20  Simulation Verification Note  Status: outpatient simulation verification performed on 06/28/2012  NARRATIVE: The patient was brought to the treatment unit and placed in the planned treatment position. The clinical setup was verified. Then port films were obtained and uploaded to the radiation oncology medical record software.  The treatment beams were carefully compared against the planned radiation fields. The position location and shape of the radiation fields was reviewed. They targeted volume of tissue appears to be appropriately covered by the radiation beams. Organs at risk appear to be excluded as planned.  Based on my personal review, I approved the simulation verification. The patient's treatment will proceed as planned.  -----------------------------------  Billie Lade, PhD, MD

## 2012-07-24 NOTE — Progress Notes (Signed)
  Radiation Oncology         [phone] ________________________________  Name: [name] MRN: [id number]  Date: 07/21/2012  DOB: 1941-02-27  End of Treatment Note  Diagnosis:   Right breast cancer     Indication for treatment:  Breast conservation therapy       Radiation treatment dates:   05/03/2012 through 07/11/2012  Site/dose:   Right breast high axilla and supraclavicular region 4500 cGy in 25 fractions,. the site of presentation in the central aspect of the breast area was boosted further to a  dose of 6300 cGy.  Beams/energy:   The right breast area was treated with tangential beams using 6 and 18 MV photons. The high axilla and right supraclavicular region was treated with a left anterior oblique field using 10 MV photons.  The patient was treated with reduced field photon beam arrangement for her boost field. This was treated with an AP and right posterior oblique field using a combination of 18 MV photons.  Narrative: The patient tolerated radiation treatment relatively well.   She had some mild fatigue as well as some itching and discomfort in the breast area.  Plan: The patient has completed radiation treatment. The patient will return to radiation oncology clinic for routine followup in one month. I advised them to call or return sooner if they have any questions or concerns related to their recovery or treatment.  -----------------------------------  Billie Lade, PhD, MD

## 2012-07-26 ENCOUNTER — Telehealth (INDEPENDENT_AMBULATORY_CARE_PROVIDER_SITE_OTHER): Payer: Self-pay | Admitting: General Surgery

## 2012-07-26 NOTE — Telephone Encounter (Signed)
Pt called back based on return call made last week (07/22/12). Patient given appointment set in system of 08/01/12 at 3:00. Patient agreed.

## 2012-08-01 ENCOUNTER — Ambulatory Visit (INDEPENDENT_AMBULATORY_CARE_PROVIDER_SITE_OTHER): Payer: Medicare Other | Admitting: General Surgery

## 2012-08-01 ENCOUNTER — Encounter (INDEPENDENT_AMBULATORY_CARE_PROVIDER_SITE_OTHER): Payer: Self-pay | Admitting: General Surgery

## 2012-08-01 VITALS — BP 158/88 | HR 74 | Temp 97.6°F | Resp 18 | Ht 65.5 in | Wt 224.5 lb

## 2012-08-01 DIAGNOSIS — C50419 Malignant neoplasm of upper-outer quadrant of unspecified female breast: Secondary | ICD-10-CM

## 2012-08-01 NOTE — Progress Notes (Signed)
Patient ID: [name], female   DOB: March 26, 1941, 71 y.o.   MRN: [id number]  This patient returns for removal of her Port-A-Cath.  Significant history is diagnosis of right breast cancer involving the skin of the nipple and areola in November of 2012. Port-A-Cath was inserted and she underwent neo- adjuvant chemotherapy. Following that she was taken to the operating room on 04/01/2012 underwent right partial mastectomy and sentinel node biopsy. This was a fairly generous central lumpectomy including the nipple areolar complex. Final pathology report was ypT4, yp N0. She had postop adjuvant radiation therapy and has done well from that. Dr. Welton Flakes referred her back for Port-A-Cath removal.   Procedure note:  Patient taken to room 11 in [facility] surgery offices. Placed supine on the operating table. Surgical time out performed. The right neck and infraclavicular area prepped and draped in a sterile fashion. 1% Xylocaine with epinephrine was used as local anesthetic. Transverse incision was made through the old scar. I dissected down into the deep subcutaneous tissue and dissected the capsule and out. I cut the Prolene sutures and mobilized the port and catheter. The port and catheter were completely removed intact. There was no bleeding. Subcutaneous tissue closed with 3-0 Vicryl sutures and skin closed with running subcuticular suture of 4-0 Monocryl and Dermabond. Patient tolerated procedure well. There were no complications. EBL 5 cc.  Plan:  The patient has hydrocodone at home. Wound care instructions given to patient  Return to see me in December after she gets her annual mammograms.   Angelia Mould. Derrell Lolling, M.D., [facility] General and Minimally invasive Surgery Breast and Colorectal Surgery Office:   [phone] Pager:   [phone]

## 2012-08-01 NOTE — Patient Instructions (Signed)
Keep the incision clean and dry. You may start taking a shower in 36 hours.  Do not take a tub bath or go in a swimming pool for 2 weeks.  You may drive your car.  I would intermittently place an ice pack on the wound for the next 24 hours to reduce bruising and discomfort.  Be sure to get your mammograms in November and see Dr. Derrell Lolling in December.

## 2012-08-02 HISTORY — PX: NM MYOVIEW LTD: HXRAD82

## 2012-08-14 ENCOUNTER — Encounter: Payer: Self-pay | Admitting: Radiation Oncology

## 2012-08-15 ENCOUNTER — Ambulatory Visit
Admission: RE | Admit: 2012-08-15 | Discharge: 2012-08-15 | Disposition: A | Discharge status: Home / Self Care | Payer: Medicare Other | Source: Ambulatory Visit | Attending: Radiation Oncology | Admitting: Radiation Oncology

## 2012-08-15 ENCOUNTER — Encounter: Payer: Self-pay | Admitting: Radiation Oncology

## 2012-08-15 VITALS — BP 141/72 | HR 70 | Temp 98.1°F | Wt 220.6 lb

## 2012-08-15 DIAGNOSIS — C50911 Malignant neoplasm of unspecified site of right female breast: Secondary | ICD-10-CM

## 2012-08-15 NOTE — Progress Notes (Signed)
[name] here for her 1st fu since the end of treatment for breast cancer.  Denies any pain.  Hyperpigmentation noted with dryness in prior tx field.  Denies any fatigue.

## 2012-08-15 NOTE — Progress Notes (Signed)
Radiation Oncology         ([phone]) [phone] ________________________________  Name: [name] MRN: [id number]  Date: 08/15/2012  DOB: Dec 20, 1941  Follow-Up Visit Note  CC: Jasmine Kayser, MD  Jasmine December, MD  Diagnosis:   Right breast cancer  Interval Since Last Radiation:  1 months  Narrative:  The patient returns today for routine follow-up.  Clinically she seems to be doing well at this point. She denies any fatigue itching or discomfort in the breast area. She recently had her Port-A-Cath removed. She has started taking letrozole.                              ALLERGIES:  is allergic to lactose intolerance (gi); lipitor; and zocor.  Meds: Current Outpatient Prescriptions  Medication Sig Dispense Refill  . albuterol (PROVENTIL HFA;VENTOLIN HFA) 108 (90 BASE) MCG/ACT inhaler Inhale 2 puffs into the lungs as needed.        Marland Kitchen aspirin 81 MG tablet Take 81 mg by mouth daily.       . cyclobenzaprine (FLEXERIL) 10 MG tablet Take 5 mg by mouth as needed. For muscle spasms      . diltiazem (TIAZAC) 240 MG 24 hr capsule       . emollient (BIAFINE) cream Apply topically 2 (two) times daily.      Marland Kitchen ezetimibe (ZETIA) 10 MG tablet Take 10 mg by mouth daily. AM      . Fluticasone-Salmeterol (ADVAIR) 100-50 MCG/DOSE AEPB Inhale 1 puff into the lungs every 12 (twelve) hours.       . hyaluronate sodium (RADIAPLEXRX) GEL Apply 1 application topically 2 (two) times daily. 2nd tube given, applys to skin area bid,after rad tx and bedtime, and prn but not 3 hours prior to rad tx      . HYDROcodone-acetaminophen (VICODIN) 5-500 MG per tablet Take 1 tablet by mouth every 6 (six) hours as needed.      . lansoprazole (PREVACID) 30 MG capsule Take 30 mg by mouth 2 (two) times daily.       Marland Kitchen letrozole (FEMARA) 2.5 MG tablet Take 1 tablet (2.5 mg total) by mouth daily.  90 tablet  12  . meloxicam (MOBIC) 7.5 MG tablet Take 5 mg by mouth as needed. For knee tightness      . rosuvastatin (CRESTOR) 5 MG tablet  Take 5 mg by mouth daily. AM      . triamterene-hydrochlorothiazide (MAXZIDE-25) 37.5-25 MG per tablet Take 1 tablet by mouth daily. AM        Physical Findings: The patient is in no acute distress. Patient is alert and oriented.  weight is 220 lb 9.6 oz (100.064 kg). Her temperature is 98.1 F (36.7 C). Her blood pressure is 141/72 and her pulse is 70. Marland Kitchen  No palpable cervical supraclavicular or axillary adenopathy. The lungs are clear to auscultation. The heart has a regular rhythm and rate. Examination of the left breast reveals no mass or nipple discharge. Examination right breast area reveals hyperpigmentation changes. There's some mild edema in the breast area. The nipple areolar complex area is surgically absent. There is some induration along the patient's lumpectomy scar but no dominant masses appreciated in the breast.  Lab Findings: Lab Results  Component Value Date   WBC 3.0* 07/21/2012   HGB 13.3 07/21/2012   HCT 39.6 07/21/2012   MCV 85.7 07/21/2012   PLT 260 07/21/2012    @LASTCHEM @  Radiographic Findings: No results found.  Impression:  The patient is recovering from the effects of radiation.  No signs recurrence on exam today.  Plan:  Routine followup in 6 months.  _____________________________________   Billie Lade, PhD, MD

## 2012-10-24 ENCOUNTER — Telehealth: Payer: Self-pay | Admitting: *Deleted

## 2012-10-24 ENCOUNTER — Other Ambulatory Visit (HOSPITAL_BASED_OUTPATIENT_CLINIC_OR_DEPARTMENT_OTHER): Payer: Medicare Other | Admitting: Lab

## 2012-10-24 ENCOUNTER — Ambulatory Visit (HOSPITAL_BASED_OUTPATIENT_CLINIC_OR_DEPARTMENT_OTHER): Payer: Medicare Other | Admitting: Oncology

## 2012-10-24 ENCOUNTER — Encounter: Payer: Self-pay | Admitting: Oncology

## 2012-10-24 VITALS — BP 132/79 | HR 90 | Temp 98.2°F | Resp 20 | Ht 65.5 in | Wt 226.6 lb

## 2012-10-24 DIAGNOSIS — C50919 Malignant neoplasm of unspecified site of unspecified female breast: Secondary | ICD-10-CM

## 2012-10-24 DIAGNOSIS — C50911 Malignant neoplasm of unspecified site of right female breast: Secondary | ICD-10-CM

## 2012-10-24 DIAGNOSIS — Z170 Estrogen receptor positive status [ER+]: Secondary | ICD-10-CM

## 2012-10-24 LAB — CBC WITH DIFFERENTIAL/PLATELET
BASO%: 1 % (ref 0.0–2.0)
HCT: 39.5 % (ref 34.8–46.6)
HGB: 13.4 g/dL (ref 11.6–15.9)
MCHC: 34.1 g/dL (ref 31.5–36.0)
MONO#: 0.3 10*3/uL (ref 0.1–0.9)
NEUT%: 68 % (ref 38.4–76.8)
RDW: 16.1 % — ABNORMAL HIGH (ref 11.2–14.5)
WBC: 4.2 10*3/uL (ref 3.9–10.3)
lymph#: 0.9 10*3/uL (ref 0.9–3.3)

## 2012-10-24 LAB — COMPREHENSIVE METABOLIC PANEL (CC13)
ALT: 17 U/L (ref 0–55)
AST: 17 U/L (ref 5–34)
Albumin: 4.2 g/dL (ref 3.5–5.0)
BUN: 15 mg/dL (ref 7.0–26.0)
Calcium: 10.6 mg/dL — ABNORMAL HIGH (ref 8.4–10.4)
Chloride: 101 mEq/L (ref 98–107)
Potassium: 4 mEq/L (ref 3.5–5.1)
Sodium: 142 mEq/L (ref 136–145)
Total Protein: 7.4 g/dL (ref 6.4–8.3)

## 2012-10-24 MED ORDER — LETROZOLE 2.5 MG PO TABS: 2.5000 mg | ORAL_TABLET | Freq: Every day | ORAL | Status: DC

## 2012-10-24 NOTE — Patient Instructions (Addendum)
Doing well, Continue taking letrozole 2.5 mg daily  We will see you back in 6 months in follow up

## 2012-10-24 NOTE — Telephone Encounter (Signed)
Gave patient appointment for 04-24-2013 starting at 9:15am

## 2012-10-24 NOTE — Progress Notes (Signed)
OFFICE PROGRESS NOTE  CC [name], MD [name], MD [address] [place] [address]  DIAGNOSIS: 71 year old female with invasive ductal carcinoma of the right breast involving the right nipple diagnosed November 2012   PRIOR THERAPY:   #1 status post ultrasound-guided core biopsy of the mass at the 11:00 position 3 cm from the right nipple. The pathology revealed invasive mammary carcinoma with carcinoma in situ grade 2 tumor was estrogen receptor +100% progesterone receptor +3% proliferation marker 38% HER-2/neu negative.  #2 MRI of the breast showed 2.5 x 2.4 x 1.8 cm spiculated enhancing mass in the anterior aspect of the upper outer quadrant of the right breast with smaller linear and nodular enhancing masses extending to the biopsied mass to the right nipple.  #3 patient is status post punch biopsy of the right nipple with the pathology revealing an invasive mammary carcinoma as well.  #4 S/P 5 cycles of TC last given on 02/24/12  #5. Post chemotherapy MRI on 02/29/12 with decrease in size of the breast mass.  #6 patient is now status post right  lumpectomy with sentinel node biopsy. The final pathology revealed an invasive ductal carcinoma with lymphovascular space invasion tumor measured 1.9 cm 2 sentinel nodes were negative for metastatic disease ER positive PR positive HER-2/neu negative.  #7 patient is now status post radiation therapy to the right breast overall she tolerated it well. Her treatment ended on 07/11/2012.  #8 a she will begin adjuvant antiestrogen therapy with letrozole 2.5 mg daily starting 07/21/2012.  CURRENT THERAPY: . Letrozole 2.5 mg daily  INTERVAL HISTORY: [name] 71 y.o. female returns for followup visit today. Overall she is doing well. She has recovered from the radiation therapy as well as the chemotherapy. She is getting out letrozole 2.5 mg daily she seems to be tolerating it well as well. She does have some hot flashes but they're  very much tolerable. She denies having any aches or pains. She has no nausea vomiting fevers chills or night sweats. She has no peripheral paresthesias. No bleeding problems no vaginal discharge. Remainder of the 10 point review of systems is negative.  MEDICAL HISTORY: Past Medical History  Diagnosis Date  . GERD (gastroesophageal reflux disease)   . Asthma     daily and prn inhalers  . Grave's disease     no current meds.  . Arthritis     osteo - s/p right total knee  . Spondylosis     cervical and lumbar  . Hyperlipidemia   . Breast cancer 10/28/11    right breast  . Hypertension     under control with med.  . Sleep apnea sleep study 09/18/2005    uses CPAP occ. - to bring machine DOS  . S/P chemotherapy, time since 4-12 weeks finished 02/20/2011  . Diabetes mellitus     diet-controlled  . History of chemotherapy     neoadjuvant: 5 cycles of Taxol/carboplatinum:  last dose 02/24/12  . Boil, axilla   . S/P radiation therapy 05/03/12 - 07/11/12    Right Breast High Axilla and Supraclavicular gegion: 4500 cGy/25 Fractions with boost for a total of 6300 cGy  . Use of letrozole (Femara) start 07/21/12    ALLERGIES:  is allergic to lactose intolerance (gi); lipitor; and zocor.  MEDICATIONS:  Current Outpatient Prescriptions  Medication Sig Dispense Refill  . albuterol (PROVENTIL HFA;VENTOLIN HFA) 108 (90 BASE) MCG/ACT inhaler Inhale 2 puffs into the lungs as needed.        Marland Kitchen  aspirin 81 MG tablet Take 81 mg by mouth daily.       . cyclobenzaprine (FLEXERIL) 10 MG tablet Take 5 mg by mouth as needed. For muscle spasms      . diltiazem (TIAZAC) 240 MG 24 hr capsule       . emollient (BIAFINE) cream Apply topically 2 (two) times daily.      Marland Kitchen ezetimibe (ZETIA) 10 MG tablet Take 10 mg by mouth daily. AM      . Fluticasone-Salmeterol (ADVAIR) 100-50 MCG/DOSE AEPB Inhale 1 puff into the lungs every 12 (twelve) hours.       . hyaluronate sodium (RADIAPLEXRX) GEL Apply 1 application topically 2  (two) times daily. 2nd tube given, applys to skin area bid,after rad tx and bedtime, and prn but not 3 hours prior to rad tx      . HYDROcodone-acetaminophen (VICODIN) 5-500 MG per tablet Take 1 tablet by mouth every 6 (six) hours as needed.      . lansoprazole (PREVACID) 30 MG capsule Take 30 mg by mouth 2 (two) times daily.       Marland Kitchen letrozole (FEMARA) 2.5 MG tablet Take 2.5 mg by mouth daily.      . meloxicam (MOBIC) 7.5 MG tablet Take 5 mg by mouth as needed. For knee tightness      . rosuvastatin (CRESTOR) 5 MG tablet Take 5 mg by mouth daily. AM      . triamterene-hydrochlorothiazide (MAXZIDE-25) 37.5-25 MG per tablet Take 1 tablet by mouth daily. AM        SURGICAL HISTORY:  Past Surgical History  Procedure Date  . Lumbar laminectomy     back surg. x 2 - 1980's and 1999  . Total knee arthroplasty 07/28/2006    right  . Cataract extraction, bilateral   . Bilateral oophorectomy 2009  . Partial hysterectomy     BSO  . Cardiac catheterization 04/10/2010, 05/07/2004    LAD lesion, blood flow OK, per pt.  . Portacath placement 11/27/2011    Procedure: INSERTION PORT-A-CATH;  Surgeon: Ernestene Mention, MD;  Location: [facility];  Service: General;  Laterality: Right;  . Lesion removal 11/27/2011    Procedure: LESION REMOVAL;  Surgeon: Ernestene Mention, MD;  Location: [facility];  Service: General;  Laterality: Right;  Skin tag removed from under right eye. No specimen  . Abdominal hysterectomy   . Carpal tunnel release     left  . Mastectomy partial / lumpectomy 04/01/12    right breast, inv ductal ca, ER/PR  +, HER 2 -   . Breast surgery    ECOG PERFORMANCE STATUS: 0 - Asymptomatic  Blood pressure 132/79, pulse 90, temperature 98.2 F (36.8 C), temperature source Oral, resp. rate 20, height 5' 5.5" (1.664 m), weight 226 lb 9.6 oz (102.785 kg). Physical examination:   HEENT: EOMI PERRLA sclerae anicteric no conjunctival pallor oral mucosa is moist neck is  supple NO palpable adenopathy LUNGS: are clear bilaterally to auscultation and percussion CVS: is regular rate rhythm no murmurs gallops or rubs GI: abdomen is soft nontender nondistended bowel sounds are present no hepatosplenomegaly  EXTREMITIES: extremities no clubbing edema or cyanosis CNS: neuro patient's alert oriented x3 strength is symmetrical in upper and lower extremities. Breast examination left breast no masses nipple discharge right breast no masses nipple discharge no skin changes   LABORATORY DATA: Lab Results  Component Value Date   WBC 4.2 10/24/2012   HGB 13.4 10/24/2012  HCT 39.5 10/24/2012   MCV 89.6 10/24/2012   PLT 282 10/24/2012      Chemistry      Component Value Date/Time   NA 141 07/21/2012 1034   K 3.9 07/21/2012 1034   CL 101 07/21/2012 1034   CO2 34* 07/21/2012 1034   BUN 17 07/21/2012 1034   CREATININE 0.75 07/21/2012 1034      Component Value Date/Time   CALCIUM 10.4 07/21/2012 1034   ALKPHOS 81 07/21/2012 1034   AST 15 07/21/2012 1034   ALT 15 07/21/2012 1034   BILITOT 0.3 07/21/2012 1034     ADDITIONAL INFORMATION: 1. PROGNOSTIC INDICATORS - ACIS Results IMMUNOHISTOCHEMICAL AND MORPHOMETRIC ANALYSIS BY THE AUTOMATED CELLULAR IMAGING SYSTEM (ACIS) Estrogen Receptor (Negative, <1%): 90%, STRONG (MANUALLY) STAINING INTENSITY Progesterone Receptor (Negative, <1%): 34%, MODERATE STAINING INTENSITY All controls stained appropriately Pecola Leisure MD Pathologist, Electronic Signature ( Signed 04/10/2012) 1. CHROMOGENIC IN-SITU HYBRIDIZATION Interpretation HER-2/NEU BY CISH - NO AMPLIFICATION OF HER-2 DETECTED. THE RATIO OF HER-2: CEP 17 SIGNALS WAS 1.03. Reference range: Ratio: HER2:CEP17 < 1.8 - gene amplification not observed Ratio: HER2:CEP 17 1.8-2.2 - equivocal result Ratio: HER2:CEP17 > 2.2 - gene amplification observed Pecola Leisure MD Pathologist, Electronic Signature ( Signed 04/10/2012) 1 of 4 FINAL for GOLDIA, LIGMAN ([id number]) FINAL  DIAGNOSIS Diagnosis 1. Breast, lumpectomy, Central right - INVASIVE DUCTAL CARCINOMA (1.9 CM), SEE COMMENT. - INVASIVE TUMOR IS 0.5 CM FROM NEAREST MARGIN (SUPERIOR MARGIN) - LYMPHOVASCULAR AND PERINEURAL INVASION PRESENT. - TUMOR INVOLVES SKIN/NIPPLE - TUMOR INVOLVES NIPPLE DERMAL LYMPHATICS - DUCTAL CARCINOMA IN SITU - SEE TUMOR SYNOPTIC TEMPLATE BELOW 2. Lymph node, sentinel, biopsy, Right axillary - ONE LYMPH NODE, NEGATIVE FOR TUMOR (0/1) 3. Lymph node, sentinel, biopsy, Right axillary #2 - ONE LYMPH NODE, NEGATIVE FOR TUMOR (0/1) Microscopic Comment 1. BREAST, INVASIVE TUMOR, WITH LYMPH NODE SAMPLING Specimen, including laterality: Right breast Procedure: Lumpectomy Grade: II of III Tubule formation: 3 Nuclear pleomorphism: 2 Mitotic: 1 Tumor size (gross measurement): 1.9 cm Margins: Invasive, distance to closest margin: 0.5 cm In-situ, distance to closest margin: 0.5 cm If margin positive, focally or broadly: N/A Lymphovascular invasion: Present Ductal carcinoma in situ: Present Grade: II of III Extensive intraductal component: Absent Lobular neoplasia: Absent Tumor focality: Unifocal Treatment effect: Present If present, treatment effect in breast tissue, lymph nodes or both: Breast tissue and lymph nodes. Extent of tumor: Skin: Positive for tumor Nipple: Positive for tumor Skeletal muscle: N/A Lymph nodes: # examined: 2 number positive: 0 Breast prognostic profile: Estrogen receptor: Repeated, previous study demonstrated 100% positivity ([id number]) Progesterone receptor: Repeated, previous study demonstrated 3% positivity ([id number]) Her 2 neu: Repeated, previous study demonstrated no amplification ([id number]). Ki-67: Not repeated, previous study demonstrated 38% proliferation rate ([id number]). Non-neoplastic breast: Neoadjuvant related change and calcifications. Previous biopsy site identified. TNM: ypT4, pN0, pMX Comments: None (CR:kh 04-05-12) [place]  RUND DO Pathologist, Electronic Signature (Case signed 04/05/2012) 2 of 4   ASSESSMENT: 71 year old female with 1.  7.5 cm mass in the right breast with positive skin biopsy at the nipple of the right breast. Patient's tumor is ER positive PR positive HER-2/neu negative.   #2 s/p 5 cycles of neoadjuvant chemotherapy consisting of taxotere and cytoxan. Last cycle given on 02/24/12  #3 S/P right breast lumpectomy with sentinel node biopsy The final pathology revealed a 1.9 cm residual disease 2 sentinel nodes were negative for metastatic disease. Final staging is ypT4N0. Tumor is estrogen receptor positive progesterone receptor positive HER-2/neu negative.  #4 patient is now  status post radiation therapy to the right breast completed on 07/11/2012.  #5 She is on  antiestrogen therapy with letrozole 2.5 mg daily. Risks and benefits of this treatment were discussed with the patient and information has been disseminated to her.   PLAN: #1 Tolerating letrozole well She has no evidence of recurrent disease  #2. Return in 6 months for follow up   All questions were answered. The patient knows to call the clinic with any problems, questions or concerns. We can certainly see the patient much sooner if necessary.  I spent 25 minutes counseling the patient and coordinating care with a 30 minute appointment.    Drue Second, MD Medical/Oncology [facility] [phone] (beeper) [phone] (Office)  10/24/2012, 10:18 AM

## 2012-10-26 ENCOUNTER — Telehealth: Payer: Self-pay | Admitting: *Deleted

## 2012-10-26 NOTE — Telephone Encounter (Signed)
Per NP, notified pt to take Vitamin D3 1000 units daily.

## 2012-10-26 NOTE — Telephone Encounter (Signed)
Message copied by Cooper Render on Wed Oct 26, 2012  2:54 PM ------      Message from: Laural Golden      Created: Wed Oct 26, 2012  2:09 PM       Please see if patient is taking vitamin d3 1000 units.  If so, have them double the dose, if not, have them start taking it.        ----- Message -----         From: [facility] Interface         Sent: 10/24/2012   9:23 AM           To: Victorino December, MD

## 2012-10-28 ENCOUNTER — Telehealth: Payer: Self-pay | Admitting: *Deleted

## 2012-10-28 NOTE — Telephone Encounter (Signed)
Pt called states " I think I'm already taking Vitamin D3" - instructed pt per NP for pt to take 2000 units daily. Pt verbalized understanding. Pt requested copy of recent labs be faxed to Dr. Loraine Leriche Perini's office. Labs faxed to [phone] -Dr. Laurey Morale office per pt request/

## 2012-11-03 ENCOUNTER — Telehealth: Payer: Self-pay | Admitting: *Deleted

## 2012-11-03 NOTE — Telephone Encounter (Signed)
Pt called requested all Letrozole refills be q# 90. Pt would like to have them mail order as her insurance will cover them at no cost to patient. Reviewed pt's med chart. Rx has been ordered Q90, with 1 yr of refills.  Discussed with pt she can transfer this Rx to her mail order pharmacy.

## 2012-11-08 ENCOUNTER — Telehealth (INDEPENDENT_AMBULATORY_CARE_PROVIDER_SITE_OTHER): Payer: Self-pay | Admitting: General Surgery

## 2012-11-08 NOTE — Telephone Encounter (Signed)
Patient called in stating she was supposed to have a mammogram before seeing Dr. Derrell Lolling but did not know if she was supposed to schedule it or not, she also did not have an appointment yet to see Dr. Derrell Lolling. Helaine Chess post op appointment for patient for 12/05/12 at 4:15. Advised patient to contact [facility] to set up the appointment based on what will work for her schedule. Patient given the direct number to call and schedule the appointment. Patient advised of the exact test ordered and that they will have access to the information. Patient agreed.

## 2012-12-02 ENCOUNTER — Telehealth: Payer: Self-pay | Admitting: Internal Medicine

## 2012-12-02 ENCOUNTER — Ambulatory Visit
Admission: RE | Admit: 2012-12-02 | Discharge: 2012-12-02 | Disposition: A | Discharge status: Home / Self Care | Payer: Medicare Other | Source: Ambulatory Visit | Attending: General Surgery | Admitting: General Surgery

## 2012-12-02 DIAGNOSIS — C50419 Malignant neoplasm of upper-outer quadrant of unspecified female breast: Secondary | ICD-10-CM

## 2012-12-02 NOTE — Telephone Encounter (Signed)
Spoke with Malachi Bonds and she will send records on Monday.(her computer is down today) Scheduled patient on 12/06/12 at 2:00 PM with Mike Gip, PA. Left a message for patient to call me.

## 2012-12-02 NOTE — Telephone Encounter (Signed)
Patient left a voice mail that her phone is on vibrate. Called her again and left a message with appointment date and time. Requested she call to confirm receipt.

## 2012-12-02 NOTE — Telephone Encounter (Signed)
Patient called and confirmed appointment

## 2012-12-05 ENCOUNTER — Encounter (INDEPENDENT_AMBULATORY_CARE_PROVIDER_SITE_OTHER): Payer: Self-pay | Admitting: General Surgery

## 2012-12-05 ENCOUNTER — Telehealth: Payer: Self-pay | Admitting: *Deleted

## 2012-12-05 ENCOUNTER — Ambulatory Visit (INDEPENDENT_AMBULATORY_CARE_PROVIDER_SITE_OTHER): Payer: Medicare Other | Admitting: General Surgery

## 2012-12-05 VITALS — BP 134/70 | HR 80 | Temp 97.9°F | Resp 16 | Ht 64.5 in | Wt 224.6 lb

## 2012-12-05 DIAGNOSIS — C50419 Malignant neoplasm of upper-outer quadrant of unspecified female breast: Secondary | ICD-10-CM

## 2012-12-05 NOTE — Telephone Encounter (Signed)
Called Synetta Fail at [facility] and left a message for her asking for records on this patient who is a  Dr Waynard Edwards patient. I LM for Synetta Fail and asked her to fax me office note, labs, imaging etc.

## 2012-12-05 NOTE — Patient Instructions (Signed)
Your breast wound, axillary wound, and Port-A-Cath wounds are all healed well. I do not feel any abnormality in either breast. There is no evidence of cancer.  Continue the letrozole medicine and keep your appointments with Dr. Welton Flakes.  Return to see Dr. Derrell Lolling in one year after you get her annual mammograms.

## 2012-12-05 NOTE — Progress Notes (Signed)
Patient ID: [name], female   DOB: 1941/11/09, 71 y.o.   MRN: [id number]  Chief Complaint  Patient presents with  . Routine Post Op    discuss MGM results    HPI [name] is a 71 y.o. female.  She returns for long-term followup regarding her right breast cancer.  One year ago, in November 2012 She was diagnosed with receptor positive cancer of the right breast centrally involving the areola.  Port-A-Cath was inserted and she underwent neoadjuvant chemotherapy. On 04/01/2012 she underwent right partial mastectomy and sentinel lymph node biopsy. This was this central Lumpectomy removing the nipple and areola. Pathologic staging at that point was ypT4, ypN0. She recovered from the surgery and had adjuvant radiation therapy. I removed her port on 08/01/2012.  Her mammograms of both breasts were performed on 12/02/2012 and they looked fine, category 2.  She is told tolerating the letrozole  No real complaints about her breast. Moderate tenderness in the right axilla. No arm swelling or numbness. HPI  Past Medical History  Diagnosis Date  . GERD (gastroesophageal reflux disease)   . Asthma     daily and prn inhalers  . Grave's disease     no current meds.  . Arthritis     osteo - s/p right total knee  . Spondylosis     cervical and lumbar  . Hyperlipidemia   . Breast cancer 10/28/11    right breast  . Hypertension     under control with med.  . Sleep apnea sleep study 09/18/2005    uses CPAP occ. - to bring machine DOS  . S/P chemotherapy, time since 4-12 weeks finished 02/20/2011  . Diabetes mellitus     diet-controlled  . History of chemotherapy     neoadjuvant: 5 cycles of Taxol/carboplatinum:  last dose 02/24/12  . Boil, axilla   . S/P radiation therapy 05/03/12 - 07/11/12    Right Breast High Axilla and Supraclavicular gegion: 4500 cGy/25 Fractions with boost for a total of 6300 cGy  . Use of letrozole (Femara) start 07/21/12    Past Surgical History  Procedure Date  .  Lumbar laminectomy     back surg. x 2 - 1980's and 1999  . Total knee arthroplasty 07/28/2006    right  . Cataract extraction, bilateral   . Bilateral oophorectomy 2009  . Partial hysterectomy     BSO  . Cardiac catheterization 04/10/2010, 05/07/2004    LAD lesion, blood flow OK, per pt.  . Portacath placement 11/27/2011    Procedure: INSERTION PORT-A-CATH;  Surgeon: Ernestene Mention, MD;  Location: [facility];  Service: General;  Laterality: Right;  . Lesion removal 11/27/2011    Procedure: LESION REMOVAL;  Surgeon: Ernestene Mention, MD;  Location: [facility];  Service: General;  Laterality: Right;  Skin tag removed from under right eye. No specimen  . Abdominal hysterectomy   . Carpal tunnel release     left  . Mastectomy partial / lumpectomy 04/01/12    right breast, inv ductal ca, ER/PR  +, HER 2 -   . Breast surgery     Family History  Problem Relation Age of Onset  . Cancer Maternal Grandfather     unaware  . Anesthesia problems Cousin     pt. states she is not aware of any family anes. prob., does not know where this comment came from  . Cancer Cousin     breast, brain mets  .  Cancer Cousin     ovarian, age 42-50  . Cancer Cousin     pancreatic    Social History History  Substance Use Topics  . Smoking status: Never Smoker   . Smokeless tobacco: Never Used  . Alcohol Use: No    Allergies  Allergen Reactions  . Lactose Intolerance (Gi) Other (See Comments)    ABD. CRAMPS  . Lipitor (Atorvastatin Calcium) Other (See Comments)    LEG CRAMPS  . Zocor (Simvastatin - High Dose) Other (See Comments)    Leg cramps    Current Outpatient Prescriptions  Medication Sig Dispense Refill  . albuterol (PROVENTIL HFA;VENTOLIN HFA) 108 (90 BASE) MCG/ACT inhaler Inhale 2 puffs into the lungs as needed.        Marland Kitchen aspirin 81 MG tablet Take 81 mg by mouth daily.       . cyclobenzaprine (FLEXERIL) 10 MG tablet Take 5 mg by mouth as needed. For muscle  spasms      . Dexlansoprazole (DEXILANT PO) Take by mouth.      . diltiazem (TIAZAC) 240 MG 24 hr capsule       . emollient (BIAFINE) cream Apply topically 2 (two) times daily.      Marland Kitchen ezetimibe (ZETIA) 10 MG tablet Take 10 mg by mouth daily. AM      . Fluticasone-Salmeterol (ADVAIR) 100-50 MCG/DOSE AEPB Inhale 1 puff into the lungs every 12 (twelve) hours.       . hyaluronate sodium (RADIAPLEXRX) GEL Apply 1 application topically 2 (two) times daily. 2nd tube given, applys to skin area bid,after rad tx and bedtime, and prn but not 3 hours prior to rad tx      . letrozole (FEMARA) 2.5 MG tablet Take 1 tablet (2.5 mg total) by mouth daily.  90 tablet  12  . levothyroxine (SYNTHROID, LEVOTHROID) 75 MCG tablet daily.      . meloxicam (MOBIC) 7.5 MG tablet Take 5 mg by mouth as needed. For knee tightness      . ranitidine (ZANTAC) 150 MG tablet daily.      . rosuvastatin (CRESTOR) 5 MG tablet Take 5 mg by mouth daily. AM      . triamterene-hydrochlorothiazide (MAXZIDE-25) 37.5-25 MG per tablet Take 1 tablet by mouth daily. AM      . lansoprazole (PREVACID) 30 MG capsule Take 30 mg by mouth 2 (two) times daily.         Review of Systems Review of Systems  Constitutional: Negative for fever, chills and unexpected weight change.  HENT: Negative for hearing loss, congestion, sore throat, trouble swallowing and voice change.   Eyes: Negative for visual disturbance.  Respiratory: Negative for cough and wheezing.   Cardiovascular: Negative for chest pain, palpitations and leg swelling.  Gastrointestinal: Negative for nausea, vomiting, abdominal pain, diarrhea, constipation, blood in stool, abdominal distention and anal bleeding.  Genitourinary: Negative for hematuria, vaginal bleeding and difficulty urinating.  Musculoskeletal: Negative for arthralgias.  Skin: Negative for rash and wound.  Neurological: Negative for seizures, syncope and headaches.  Hematological: Negative for adenopathy. Does not  bruise/bleed easily.  Psychiatric/Behavioral: Negative for confusion.    Blood pressure 134/70, pulse 80, temperature 97.9 F (36.6 C), temperature source Temporal, resp. rate 16, height 5' 4.5" (1.638 m), weight 224 lb 9.6 oz (101.878 kg).  Physical Exam Physical Exam  Constitutional: She is oriented to person, place, and time. She appears well-developed and well-nourished. No distress.  HENT:  Head: Normocephalic and atraumatic.  Neck: Neck supple.  No JVD present. No tracheal deviation present. No thyromegaly present.  Cardiovascular: Normal rate, regular rhythm, normal heart sounds and intact distal pulses.   No murmur heard. Pulmonary/Chest: Effort normal and breath sounds normal. No respiratory distress. She has no wheezes. She has no rales. She exhibits no tenderness.       Transverse incision right breast. Surgical absence of the nipple and areola. Right breast is smaller than the left. Some tenting of the skin. No palpable mass in either breast. No axillary adenopathy on either side.  Musculoskeletal: She exhibits no edema and no tenderness.  Lymphadenopathy:    She has no cervical adenopathy.  Neurological: She is alert and oriented to person, place, and time. She exhibits normal muscle tone. Coordination normal.  Skin: Skin is warm. No rash noted. She is not diaphoretic. No erythema. No pallor.  Psychiatric: She has a normal mood and affect. Her behavior is normal. Judgment and thought content normal.    Data Reviewed Recent mammograms. Cancer Center notes.  Assessment    Receptor positive invasive cancer right breast, no evidence of recurrence 1 year following neoadjuvant chemotherapy, right partial mastectomy sentinel node biopsy and radiation therapy    Plan    Continue regular followup appointments with Dr. Welton Flakes  Continue letrozole  Return to see me in one year after her annual mammography.       Angelia Mould. Derrell Lolling, M.D., [facility] General and Minimally invasive Surgery Breast and Colorectal Surgery Office:   [phone] Pager:   [phone]  12/05/2012, 4:42 PM

## 2012-12-06 ENCOUNTER — Ambulatory Visit (INDEPENDENT_AMBULATORY_CARE_PROVIDER_SITE_OTHER): Payer: Medicare Other | Admitting: Physician Assistant

## 2012-12-06 ENCOUNTER — Encounter: Payer: Self-pay | Admitting: Internal Medicine

## 2012-12-06 ENCOUNTER — Encounter: Payer: Self-pay | Admitting: Physician Assistant

## 2012-12-06 VITALS — BP 140/82 | HR 84 | Ht 64.5 in

## 2012-12-06 DIAGNOSIS — M549 Dorsalgia, unspecified: Secondary | ICD-10-CM

## 2012-12-06 DIAGNOSIS — J45909 Unspecified asthma, uncomplicated: Secondary | ICD-10-CM

## 2012-12-06 DIAGNOSIS — K219 Gastro-esophageal reflux disease without esophagitis: Secondary | ICD-10-CM

## 2012-12-06 DIAGNOSIS — Z1211 Encounter for screening for malignant neoplasm of colon: Secondary | ICD-10-CM

## 2012-12-06 DIAGNOSIS — K5790 Diverticulosis of intestine, part unspecified, without perforation or abscess without bleeding: Secondary | ICD-10-CM

## 2012-12-06 DIAGNOSIS — E079 Disorder of thyroid, unspecified: Secondary | ICD-10-CM | POA: Insufficient documentation

## 2012-12-06 DIAGNOSIS — G4730 Sleep apnea, unspecified: Secondary | ICD-10-CM

## 2012-12-06 DIAGNOSIS — Z853 Personal history of malignant neoplasm of breast: Secondary | ICD-10-CM

## 2012-12-06 DIAGNOSIS — K5730 Diverticulosis of large intestine without perforation or abscess without bleeding: Secondary | ICD-10-CM

## 2012-12-06 MED ORDER — MOVIPREP 100 G PO SOLR: 1.0000 | Freq: Once | ORAL | Status: AC

## 2012-12-06 NOTE — Progress Notes (Signed)
Reviewed and agree.

## 2012-12-06 NOTE — Progress Notes (Signed)
Subjective:    Patient ID: [name], female    DOB: 07/11/1941, 71 y.o.   MRN: [id number]  HPI [name] is a very nice 71-year-old African American female known to Dr. Brodie. She had colonoscopy done in July of 2004 her screening and this showed only mild left colon diverticulosis. The patient states that she also had one prior endoscopy per Dr. McKie  years ago and was told that she had an ulcer and hiatal hernia. She does have chronic GERD and has been on Prevacid over the past several years. She was diagnosed with breast cancer in November of 2012 with an invasive carcinoma of the nipple. She has undergone a right lumpectomy followed by radiation and chemotherapy. Patient comes in today with complaints of epigastric and back pain with symptoms over the past one month. She has noticed increased gas and belching and says that the discomfort she feels in her back occasionally makes her nauseated. She has not had any vomiting. Her appetite has been fine and her weight has been stable over the past few months. She does admit to some dysphagia with pills occasionally but has not noted any symptoms with food. She has no complaint of heartburn or odynophagia. She describes her discomfort as feeling like there is something swollen about the size of a golf ball inside of her which causes discomfort into her back. She says this is worse with lying down frequently. He is not aware of any exacerbation of her symptoms with by mouth intake. She did have an upper abdominal ultrasound done in July of 2013 which was negative Labs were done in November of 2013 with a normal CBC and hepatic panel. She was seen by primary care last week and switched from Prevacid to Dexilant  in the morning and Zantac at bedtime and says that she does feel  better today She denies any problems with her bowels occasionally has loose stools has not noticed any melena or hematochezia . She does take a baby aspirin every day and had been taking  Mobic fairly regularly though not over the past few weeks   Review of Systems  Constitutional: Negative.   HENT: Positive for trouble swallowing.   Eyes: Negative.   Respiratory: Negative.   Cardiovascular: Negative.   Gastrointestinal: Positive for abdominal pain.  Genitourinary: Negative.   Musculoskeletal: Positive for back pain.  Skin: Negative.   Neurological: Negative.   Hematological: Negative.   Psychiatric/Behavioral: Negative.    Outpatient Prescriptions Prior to Visit  Medication Sig Dispense Refill  . albuterol (PROVENTIL HFA;VENTOLIN HFA) 108 (90 BASE) MCG/ACT inhaler Inhale 2 puffs into the lungs as needed.        . aspirin 81 MG tablet Take 81 mg by mouth daily.       . cyclobenzaprine (FLEXERIL) 10 MG tablet Take 5 mg by mouth as needed. For muscle spasms      . Dexlansoprazole (DEXILANT PO) Take by mouth.      . diltiazem (TIAZAC) 240 MG 24 hr capsule       . emollient (BIAFINE) cream Apply topically 2 (two) times daily.      . ezetimibe (ZETIA) 10 MG tablet Take 10 mg by mouth daily. AM      . Fluticasone-Salmeterol (ADVAIR) 100-50 MCG/DOSE AEPB Inhale 1 puff into the lungs every 12 (twelve) hours.       . hyaluronate sodium (RADIAPLEXRX) GEL Apply 1 application topically 2 (two) times daily. 2nd tube given, applys to skin area bid,after   rad tx and bedtime, and prn but not 3 hours prior to rad tx      . lansoprazole (PREVACID) 30 MG capsule Take 30 mg by mouth 2 (two) times daily.       . letrozole (FEMARA) 2.5 MG tablet Take 1 tablet (2.5 mg total) by mouth daily.  90 tablet  12  . levothyroxine (SYNTHROID, LEVOTHROID) 75 MCG tablet daily.      . meloxicam (MOBIC) 7.5 MG tablet Take 5 mg by mouth as needed. For knee tightness      . ranitidine (ZANTAC) 150 MG tablet daily.      . rosuvastatin (CRESTOR) 5 MG tablet Take 5 mg by mouth daily. AM      . triamterene-hydrochlorothiazide (MAXZIDE-25) 37.5-25 MG per tablet Take 1 tablet by mouth daily. AM      .  [DISCONTINUED] HYDROcodone-acetaminophen (VICODIN) 5-500 MG per tablet Take 1 tablet by mouth every 6 (six) hours as needed.       Last reviewed on 12/06/2012  4:36 PM by Amy S Esterwood, PA  Allergies  Allergen Reactions  . Lactose Intolerance (Gi) Other (See Comments)    ABD. CRAMPS  . Lipitor (Atorvastatin Calcium) Other (See Comments)    LEG CRAMPS  . Zocor (Simvastatin - High Dose) Other (See Comments)    Leg cramps     Patient Active Problem List  Diagnosis  . Breast cancer, right breast  . Breast cancer  . Malignant neoplasm of upper-outer quadrant of female breast  . GERD (gastroesophageal reflux disease)  . Diverticulosis  . Sleep apnea  . Asthma  . Thyroid disease     History  Substance Use Topics  . Smoking status: Never Smoker   . Smokeless tobacco: Never Used  . Alcohol Use: No   Family History  Problem Relation Age of Onset  . Cancer Maternal Grandfather     unaware  . Anesthesia problems Cousin     pt. states she is not aware of any family anes. prob., does not know where this comment came from  . Cancer Cousin     breast, brain mets  . Cancer Cousin     ovarian, age 40-50  . Cancer Cousin     pancreatic    Objective:   Physical Exam well-developed older African American female in no acute distress, pleasant blood pressure 140/82 pulse 84 height 5 foot 4. HEENT; nontraumatic normocephalic EOMI PERRLA sclera anicteric,Neck; Supple no JVD, Cardiovascular; regular rate and rhythm with S1-S2 no murmur or gallop, Pulmonary; clear bilaterally, Abdomen; large soft she has some mild tenderness in the epigastrium and hypogastrium there is no guarding or rebound no palpable mass or hepatosplenomegaly bowel sounds are active, Rectal; exam not done, Extremities; no clubbing cyanosis or edema skin warm and dry, Psych; mood and affect normal and appropriate        Assessment & Plan:  #1 71-year-old female with 1 month history of epigastric pain radiating into  the back with increased gas belching and intermittent pill dysphagia. Patient had been on aspirin and NSAID and wonder if she may have an NSAID-induced gastropathy or ulcer versus distal esophagitis. #2 colon neoplasia screening-last colonoscopy 2004, due for followup surveillance #3 diverticulosis #4 history of breast cancer 2012 status post right lumpectomy chemotherapy and radiation #5 asthma #6 sleep apnea #7 thyroid disease #8 hypertension  Plan; patient will continued Dexilant 60 mg by mouth daily and was given samples today Have asked her to hold her aspirin and stop MOBIC   Schedule for upper endoscopy and colonoscopy with Dr. Brodie-procedure discussed in detail with the patient and she is agreeable to proceed If endoscopic evaluation is unrevealing and her symptoms are persisting she will need CT scan of the abdomen and pelvis  

## 2012-12-06 NOTE — Patient Instructions (Addendum)
Stay off the Mobic and Aspirin. Stay on the Dexilant. You have been scheduled for an endoscopy and colonoscopy with propofol. Please follow the written instructions given to you at your visit today. Please pick up your prep at the pharmacy within the next 1-3 days. If you use inhalers (even only as needed) or a CPAP machine, please bring them with you on the day of your procedure.

## 2012-12-07 ENCOUNTER — Encounter (HOSPITAL_COMMUNITY): Payer: Self-pay | Admitting: *Deleted

## 2012-12-07 NOTE — Pre-Procedure Instructions (Signed)
Your procedure is scheduled WR:[id number], December 20, 2012 Report to [facility] Admitting [id number] Call this number if you have problems morning of your procedure:[phone]  Follow all bowel prep instructions per your doctor's orders.  Do not eat or drink anything after midnight the night before your procedure. You may brush your teeth, rinse out your mouth, but no water, no food, no chewing gum, no mints, no candies, no chewing tobacco.    Take these medicines the morning of your procedure with A SIP OF WATER:Diliazem,Synthroid,Dexilant and Zantac   Please make arrangements for a responsible person to drive you home after the procedure. You cannot go home by cab/taxi. We recommend you have someone with you at home the first 24 hours after your procedure. Driver for procedure is brother Dimas Aguas  LEAVE ALL VALUABLES, JEWELRY, BILLFOLD AT HOME.  NO DENTURES, CONTACT LENSES ALLOWED IN THE ENDOSCOPY ROOM.   YOU MAY WEAR DEODORANT, PLEASE REMOVE ALL JEWELRY, WATCHES RINGS, BODY PIERCINGS AND LEAVE AT HOME.   WOMEN: NO MAKE-UP, LOTIONS PERFUMES

## 2012-12-13 ENCOUNTER — Encounter (HOSPITAL_COMMUNITY): Payer: Self-pay | Admitting: Pharmacy Technician

## 2012-12-19 NOTE — Interval H&P Note (Signed)
History and Physical Interval Note:  12/19/2012 11:14 PM  [name]  has presented today for surgery, with the diagnosis of Special screening for malignant neoplasms, colon [V76.51] Abdominal pain, epigastric [789.06] History of breast cancer [V10.3] Back pain [724.5]  The various methods of treatment have been discussed with the patient and family. After consideration of risks, benefits and other options for treatment, the patient has consented to  Procedure(s) (LRB) with comments: ESOPHAGOGASTRODUODENOSCOPY (EGD) (N/A) COLONOSCOPY (N/A) as a surgical intervention .  The patient's history has been reviewed, patient examined, no change in status, stable for surgery.  I have reviewed the patient's chart and labs.  Questions were answered to the patient's satisfaction.     Lina Sar

## 2012-12-19 NOTE — H&P (View-Only) (Signed)
Subjective:    Patient ID: [name], female    DOB: 03-11-1941, 71 y.o.   MRN: [id number]  HPI [name] is a very nice 71 year old African American female known to Dr. Juanda Chance. She had colonoscopy done in July of 2004 her screening and this showed only mild left colon diverticulosis. The patient states that she also had one prior endoscopy per Dr. Tad Moore  years ago and was told that she had an ulcer and hiatal hernia. She does have chronic GERD and has been on Prevacid over the past several years. She was diagnosed with breast cancer in November of 2012 with an invasive carcinoma of the nipple. She has undergone a right lumpectomy followed by radiation and chemotherapy. Patient comes in today with complaints of epigastric and back pain with symptoms over the past one month. She has noticed increased gas and belching and says that the discomfort she feels in her back occasionally makes her nauseated. She has not had any vomiting. Her appetite has been fine and her weight has been stable over the past few months. She does admit to some dysphagia with pills occasionally but has not noted any symptoms with food. She has no complaint of heartburn or odynophagia. She describes her discomfort as feeling like there is something swollen about the size of a golf ball inside of her which causes discomfort into her back. She says this is worse with lying down frequently. He is not aware of any exacerbation of her symptoms with by mouth intake. She did have an upper abdominal ultrasound done in July of 2013 which was negative Labs were done in November of 2013 with a normal CBC and hepatic panel. She was seen by primary care last week and switched from Prevacid to Dexilant  in the morning and Zantac at bedtime and says that she does feel  better today She denies any problems with her bowels occasionally has loose stools has not noticed any melena or hematochezia . She does take a baby aspirin every day and had been taking  Mobic fairly regularly though not over the past few weeks   Review of Systems  Constitutional: Negative.   HENT: Positive for trouble swallowing.   Eyes: Negative.   Respiratory: Negative.   Cardiovascular: Negative.   Gastrointestinal: Positive for abdominal pain.  Genitourinary: Negative.   Musculoskeletal: Positive for back pain.  Skin: Negative.   Neurological: Negative.   Hematological: Negative.   Psychiatric/Behavioral: Negative.    Outpatient Prescriptions Prior to Visit  Medication Sig Dispense Refill  . albuterol (PROVENTIL HFA;VENTOLIN HFA) 108 (90 BASE) MCG/ACT inhaler Inhale 2 puffs into the lungs as needed.        Marland Kitchen aspirin 81 MG tablet Take 81 mg by mouth daily.       . cyclobenzaprine (FLEXERIL) 10 MG tablet Take 5 mg by mouth as needed. For muscle spasms      . Dexlansoprazole (DEXILANT PO) Take by mouth.      . diltiazem (TIAZAC) 240 MG 24 hr capsule       . emollient (BIAFINE) cream Apply topically 2 (two) times daily.      Marland Kitchen ezetimibe (ZETIA) 10 MG tablet Take 10 mg by mouth daily. AM      . Fluticasone-Salmeterol (ADVAIR) 100-50 MCG/DOSE AEPB Inhale 1 puff into the lungs every 12 (twelve) hours.       . hyaluronate sodium (RADIAPLEXRX) GEL Apply 1 application topically 2 (two) times daily. 2nd tube given, applys to skin area bid,after  rad tx and bedtime, and prn but not 3 hours prior to rad tx      . lansoprazole (PREVACID) 30 MG capsule Take 30 mg by mouth 2 (two) times daily.       Marland Kitchen letrozole (FEMARA) 2.5 MG tablet Take 1 tablet (2.5 mg total) by mouth daily.  90 tablet  12  . levothyroxine (SYNTHROID, LEVOTHROID) 75 MCG tablet daily.      . meloxicam (MOBIC) 7.5 MG tablet Take 5 mg by mouth as needed. For knee tightness      . ranitidine (ZANTAC) 150 MG tablet daily.      . rosuvastatin (CRESTOR) 5 MG tablet Take 5 mg by mouth daily. AM      . triamterene-hydrochlorothiazide (MAXZIDE-25) 37.5-25 MG per tablet Take 1 tablet by mouth daily. AM      .  [DISCONTINUED] HYDROcodone-acetaminophen (VICODIN) 5-500 MG per tablet Take 1 tablet by mouth every 6 (six) hours as needed.       Last reviewed on 12/06/2012  4:36 PM by Sammuel Cooper, PA  Allergies  Allergen Reactions  . Lactose Intolerance (Gi) Other (See Comments)    ABD. CRAMPS  . Lipitor (Atorvastatin Calcium) Other (See Comments)    LEG CRAMPS  . Zocor (Simvastatin - High Dose) Other (See Comments)    Leg cramps     Patient Active Problem List  Diagnosis  . Breast cancer, right breast  . Breast cancer  . Malignant neoplasm of upper-outer quadrant of female breast  . GERD (gastroesophageal reflux disease)  . Diverticulosis  . Sleep apnea  . Asthma  . Thyroid disease     History  Substance Use Topics  . Smoking status: Never Smoker   . Smokeless tobacco: Never Used  . Alcohol Use: No   Family History  Problem Relation Age of Onset  . Cancer Maternal Grandfather     unaware  . Anesthesia problems Cousin     pt. states she is not aware of any family anes. prob., does not know where this comment came from  . Cancer Cousin     breast, brain mets  . Cancer Cousin     ovarian, age 94-50  . Cancer Cousin     pancreatic    Objective:   Physical Exam well-developed older African American female in no acute distress, pleasant blood pressure 140/82 pulse 84 height 5 foot 4. HEENT; nontraumatic normocephalic EOMI PERRLA sclera anicteric,Neck; Supple no JVD, Cardiovascular; regular rate and rhythm with S1-S2 no murmur or gallop, Pulmonary; clear bilaterally, Abdomen; large soft she has some mild tenderness in the epigastrium and hypogastrium there is no guarding or rebound no palpable mass or hepatosplenomegaly bowel sounds are active, Rectal; exam not done, Extremities; no clubbing cyanosis or edema skin warm and dry, Psych; mood and affect normal and appropriate        Assessment & Plan:  #37 71 year old female with 1 month history of epigastric pain radiating into  the back with increased gas belching and intermittent pill dysphagia. Patient had been on aspirin and NSAID and wonder if she may have an NSAID-induced gastropathy or ulcer versus distal esophagitis. #2 colon neoplasia screening-last colonoscopy 2004, due for followup surveillance #3 diverticulosis #4 history of breast cancer 2012 status post right lumpectomy chemotherapy and radiation #5 asthma #6 sleep apnea #7 thyroid disease #8 hypertension  Plan; patient will continued Dexilant 60 mg by mouth daily and was given samples today Have asked her to hold her aspirin and stop MOBIC  Schedule for upper endoscopy and colonoscopy with Dr. Hermelinda Medicus discussed in detail with the patient and she is agreeable to proceed If endoscopic evaluation is unrevealing and her symptoms are persisting she will need CT scan of the abdomen and pelvis

## 2012-12-20 ENCOUNTER — Ambulatory Visit (HOSPITAL_COMMUNITY)
Admission: RE | Admit: 2012-12-20 | Discharge: 2012-12-20 | Disposition: A | Discharge status: Home / Self Care | Payer: Medicare Other | Source: Ambulatory Visit | Attending: Internal Medicine | Admitting: Internal Medicine

## 2012-12-20 ENCOUNTER — Encounter (HOSPITAL_COMMUNITY): Payer: Self-pay | Admitting: Anesthesiology

## 2012-12-20 ENCOUNTER — Encounter (HOSPITAL_COMMUNITY): Payer: Self-pay

## 2012-12-20 ENCOUNTER — Encounter (HOSPITAL_COMMUNITY)
Admission: RE | Disposition: A | Discharge status: Home / Self Care | Payer: Self-pay | Source: Ambulatory Visit | Attending: Internal Medicine

## 2012-12-20 ENCOUNTER — Ambulatory Visit (HOSPITAL_COMMUNITY): Payer: Medicare Other | Admitting: Anesthesiology

## 2012-12-20 DIAGNOSIS — K5730 Diverticulosis of large intestine without perforation or abscess without bleeding: Secondary | ICD-10-CM | POA: Insufficient documentation

## 2012-12-20 DIAGNOSIS — Z853 Personal history of malignant neoplasm of breast: Secondary | ICD-10-CM | POA: Insufficient documentation

## 2012-12-20 DIAGNOSIS — M549 Dorsalgia, unspecified: Secondary | ICD-10-CM

## 2012-12-20 DIAGNOSIS — Z1211 Encounter for screening for malignant neoplasm of colon: Secondary | ICD-10-CM | POA: Insufficient documentation

## 2012-12-20 DIAGNOSIS — R12 Heartburn: Secondary | ICD-10-CM | POA: Insufficient documentation

## 2012-12-20 DIAGNOSIS — R1319 Other dysphagia: Secondary | ICD-10-CM

## 2012-12-20 DIAGNOSIS — R1310 Dysphagia, unspecified: Secondary | ICD-10-CM | POA: Insufficient documentation

## 2012-12-20 DIAGNOSIS — K219 Gastro-esophageal reflux disease without esophagitis: Secondary | ICD-10-CM

## 2012-12-20 DIAGNOSIS — D126 Benign neoplasm of colon, unspecified: Secondary | ICD-10-CM | POA: Insufficient documentation

## 2012-12-20 DIAGNOSIS — R1013 Epigastric pain: Secondary | ICD-10-CM | POA: Insufficient documentation

## 2012-12-20 HISTORY — PX: COLONOSCOPY: SHX5424

## 2012-12-20 HISTORY — PX: ESOPHAGOGASTRODUODENOSCOPY: SHX5428

## 2012-12-20 SURGERY — EGD (ESOPHAGOGASTRODUODENOSCOPY)
Anesthesia: Monitor Anesthesia Care

## 2012-12-20 MED ORDER — SODIUM CHLORIDE 0.9 % IV SOLN: INTRAVENOUS | Status: DC

## 2012-12-20 MED ORDER — FENTANYL CITRATE 0.05 MG/ML IJ SOLN
INTRAMUSCULAR | Status: DC | PRN
  Administered 2012-12-20: 50 ug via INTRAVENOUS

## 2012-12-20 MED ORDER — MIDAZOLAM HCL 5 MG/5ML IJ SOLN
INTRAMUSCULAR | Status: DC | PRN
  Administered 2012-12-20: 2 mg via INTRAVENOUS

## 2012-12-20 MED ORDER — LACTATED RINGERS IV SOLN
INTRAVENOUS | Status: DC | PRN
  Administered 2012-12-20: 09:00:00 via INTRAVENOUS

## 2012-12-20 MED ORDER — PROPOFOL 10 MG/ML IV BOLUS
INTRAVENOUS | Status: DC | PRN
  Administered 2012-12-20: 10 mg via INTRAVENOUS
  Administered 2012-12-20: 25 mg via INTRAVENOUS
  Administered 2012-12-20: 75 mg via INTRAVENOUS
  Administered 2012-12-20: 50 mg via INTRAVENOUS
  Administered 2012-12-20: 20 mg via INTRAVENOUS
  Administered 2012-12-20: 25 mg via INTRAVENOUS
  Administered 2012-12-20: 15 mg via INTRAVENOUS
  Administered 2012-12-20: 50 mg via INTRAVENOUS
  Administered 2012-12-20: 30 mg via INTRAVENOUS

## 2012-12-20 NOTE — Transfer of Care (Signed)
Immediate Anesthesia Transfer of Care Note  Patient: [name]  Procedure(s) Performed: Procedure(s) (LRB) with comments: ESOPHAGOGASTRODUODENOSCOPY (EGD) (N/A) COLONOSCOPY (N/A)  Patient Location: PACU  Anesthesia Type:MAC  Level of Consciousness: awake, sedated and patient cooperative  Airway & Oxygen Therapy: Patient Spontanous Breathing and Patient connected to face mask oxygen  Post-op Assessment: Report given to PACU RN and Post -op Vital signs reviewed and stable  Post vital signs: Reviewed and stable  Complications: No apparent anesthesia complications

## 2012-12-20 NOTE — Anesthesia Postprocedure Evaluation (Signed)
  Anesthesia Post-op Note  Patient: [name]  Procedure(s) Performed: Procedure(s) (LRB): ESOPHAGOGASTRODUODENOSCOPY (EGD) (N/A) COLONOSCOPY (N/A)  Patient Location: PACU  Anesthesia Type: MAC  Level of Consciousness: awake and alert   Airway and Oxygen Therapy: Patient Spontanous Breathing  Post-op Pain: mild  Post-op Assessment: Post-op Vital signs reviewed, Patient's Cardiovascular Status Stable, Respiratory Function Stable, Patent Airway and No signs of Nausea or vomiting  Last Vitals:  Filed Vitals:   12/20/12 1050  BP: 120/79  Pulse:   Temp:   Resp: 11    Post-op Vital Signs: stable   Complications: No apparent anesthesia complications

## 2012-12-20 NOTE — Interval H&P Note (Signed)
History and Physical Interval Note:  12/20/2012 9:15 AM  [name]  has presented today for surgery, with the diagnosis of Special screening for malignant neoplasms, colon [V76.51] Abdominal pain, epigastric [789.06] History of breast cancer [V10.3] Back pain [724.5]  The various methods of treatment have been discussed with the patient and family. After consideration of risks, benefits and other options for treatment, the patient has consented to  Procedure(s) (LRB) with comments: ESOPHAGOGASTRODUODENOSCOPY (EGD) (N/A) COLONOSCOPY (N/A) as a surgical intervention .  The patient's history has been reviewed, patient examined, no change in status, stable for surgery.  I have reviewed the patient's chart and labs.  Questions were answered to the patient's satisfaction.     Lina Sar

## 2012-12-20 NOTE — Anesthesia Preprocedure Evaluation (Signed)
Anesthesia Evaluation  Patient identified by MRN, date of birth, ID band Patient awake    Reviewed: Allergy & Precautions, H&P , NPO status , Patient's Chart, lab work & pertinent test results  History of Anesthesia Complications Negative for: history of anesthetic complications  Airway Mallampati: I  Neck ROM: Full    Dental  (+) Teeth Intact and Missing   Pulmonary asthma , sleep apnea and Continuous Positive Airway Pressure Ventilation ,  breath sounds clear to auscultation        Cardiovascular hypertension, Rhythm:Regular Rate:Normal     Neuro/Psych    GI/Hepatic Neg liver ROS, GERD-  Controlled,  Endo/Other  Hyperthyroidism Morbid obesity  Renal/GU      Musculoskeletal   Abdominal (+) + obese,   Peds  Hematology Anemia   Anesthesia Other Findings   Reproductive/Obstetrics                           Anesthesia Physical  Anesthesia Plan  ASA: III  Anesthesia Plan: MAC   Post-op Pain Management:    Induction: Intravenous  Airway Management Planned: Simple Face Mask  Additional Equipment:   Intra-op Plan:   Post-operative Plan:   Informed Consent: I have reviewed the patients History and Physical, chart, labs and discussed the procedure including the risks, benefits and alternatives for the proposed anesthesia with the patient or authorized representative who has indicated his/her understanding and acceptance.   Dental advisory given  Plan Discussed with: CRNA and Surgeon  Anesthesia Plan Comments:         Anesthesia Quick Evaluation

## 2012-12-20 NOTE — Op Note (Signed)
[facility] [address] [place], [address]   COLONOSCOPY PROCEDURE REPORT  PATIENT: [name], [name]  MR#: [id number] BIRTHDATE: [birth year] , 71  yrs. old GENDER: Female ENDOSCOPIST: Hart Carwin, MD REFERRED BY:  Rodrigo Ran, M.D. PROCEDURE DATE:  12/20/2012 PROCEDURE:   Colonoscopy with snare polypectomy and Colonoscopy with cold biopsy polypectomy ASA CLASS:   Class III INDICATIONS:Average risk patient for colon cancer and colonoscopy in 2004 showed diverticulosis. MEDICATIONS: MAC sedation, administered by CRNA  DESCRIPTION OF PROCEDURE:   After the risks and benefits and of the procedure were explained, informed consent was obtained.  A digital rectal exam revealed no abnormalities of the rectum.    The Pentax Ped Colon [id number]  endoscope was introduced through the anus and advanced to the cecum, which was identified by both the appendix and ileocecal valve .  The quality of the prep was good, using MoviPrep .  The instrument was then slowly withdrawn as the colon was fully examined.     COLON FINDINGS: Three smooth sessile polyps ranging between 5-58mm in size were found in the ascending colon.  A polypectomy was performed with cold forceps, using snare cautery and with a cold snare. One polyp at 100cm removed and the endo clip applied to the polypcetomy site to stop the small amolunt of bleedin  The resection was complete and the polyp tissue was completely retrieved.   Mild diverticulosis was noted in the sigmoid colon. Retroflexed views revealed no abnormalities.     The scope was then withdrawn from the patient and the procedure completed.  COMPLICATIONS: There were no complications. ENDOSCOPIC IMPRESSION: 1.   Three sessile polyps ranging between 5-87mm in size were found in the ascending colon; polypectomy was performed with cold forceps, using snare cautery and with a cold snare 2.   Mild diverticulosis was noted in the sigmoid  colon  RECOMMENDATIONS: Await pathology results high fiber diet   REPEAT EXAM: In 5 year(s)  for Colonoscopy.  cc:  _______________________________ eSignedHart Carwin, MD 12/20/2012 10:18 AM     PATIENT NAME:  [name], [name] MR#: [id number]

## 2012-12-20 NOTE — Op Note (Signed)
[facility] [address] [place], [address]   ENDOSCOPY PROCEDURE REPORT  PATIENT: [name], [name]  MR#: [id number] BIRTHDATE: [birth year] , 71  yrs. old GENDER: Female ENDOSCOPIST: Hart Carwin, MD REFERRED BY:  Rodrigo Ran, M.D. PROCEDURE DATE:  12/20/2012 PROCEDURE:  EGD, diagnostic ASA CLASS:     Class III INDICATIONS:  Heartburn.   Dysphagia. MEDICATIONS: MAC sedation, administered by CRNA TOPICAL ANESTHETIC: none  DESCRIPTION OF PROCEDURE: After the risks benefits and alternatives of the procedure were thoroughly explained, informed consent was obtained.  The Pentax Gastroscope [id number] endoscope was introduced through the mouth and advanced to the second portion of the duodenum. Without limitations.  The instrument was slowly withdrawn as the mucosa was fully examined.      The upper, middle and distal third of the esophagus were carefully inspected and no abnormalities were noted.  The z-line was well seen at the GEJ.There was mild narrowing at the g-e junction, scope passed without resistance, Maloney dilator passed without difficulty, 24F  bougie ,  The endoscope was pushed into the fundus which was normal including a retroflexed view.  The antrum, gastric body, first and second part of the duodenum were unremarkable. Retroflexed views revealed no abnormalities.     The scope was then withdrawn from the patient and the procedure completed.  COMPLICATIONS: There were no complications. ENDOSCOPIC IMPRESSION:   Normal EGD s/p passage of 24F Maloney dilator  RECOMMENDATIONS: 1.  Anti-reflux regimen to be follow 2.  Continue PPI  REPEAT EXAM: no recall  eSigned:  Hart Carwin, MD 12/20/2012 10:10 AM   CC:  PATIENT NAME:  [name], [name] MR#: [id number]

## 2012-12-20 NOTE — H&P (View-Only) (Signed)
Reviewed and agree.

## 2012-12-22 ENCOUNTER — Encounter (HOSPITAL_COMMUNITY): Payer: Self-pay | Admitting: Internal Medicine

## 2012-12-22 ENCOUNTER — Encounter: Payer: Self-pay | Admitting: Internal Medicine

## 2013-02-13 ENCOUNTER — Ambulatory Visit: Payer: Medicare Other | Admitting: Radiation Oncology

## 2013-02-23 ENCOUNTER — Ambulatory Visit: Payer: Medicare Other | Admitting: Radiation Oncology

## 2013-02-23 ENCOUNTER — Telehealth: Payer: Self-pay | Admitting: Radiation Oncology

## 2013-02-23 NOTE — Telephone Encounter (Signed)
Patient no show for 0930 appointment. Phoned patient at home to check status. Patient confirms she "forgot the appointment." transferred patient to [facility] to rescheduled. Routed this to Dr. Roselind Messier.

## 2013-03-02 ENCOUNTER — Encounter: Payer: Self-pay | Admitting: Radiation Oncology

## 2013-03-02 ENCOUNTER — Ambulatory Visit
Admission: RE | Admit: 2013-03-02 | Discharge: 2013-03-02 | Disposition: A | Discharge status: Home / Self Care | Payer: Medicare Other | Source: Ambulatory Visit | Attending: Radiation Oncology | Admitting: Radiation Oncology

## 2013-03-02 VITALS — BP 140/72 | HR 73 | Temp 98.4°F | Resp 16 | Wt 224.9 lb

## 2013-03-02 DIAGNOSIS — C50911 Malignant neoplasm of unspecified site of right female breast: Secondary | ICD-10-CM

## 2013-03-02 NOTE — Progress Notes (Signed)
Radiation Oncology         [phone] ________________________________  Name: [name] MRN: [id number]  Date: 03/02/2013  DOB: [birth year]  Follow-Up Visit Note  CC: [name], MD  [name], MD  Diagnosis:   Right breast cancer  Interval Since Last Radiation:  7  months  Narrative:  The patient returns today for routine follow-up.  She is doing well without any new medical problems. She continues on Femara. She has noticed some mild joint stiffness with this medication but otherwise is tolerating well. She denies any new bony pain headaches dizziness or blurred vision. She denies any pain in the right breast area or swelling in her right arm.                              ALLERGIES:  is allergic to lactose intolerance (gi); lipitor; and zocor.  Meds: Current Outpatient Prescriptions  Medication Sig Dispense Refill  . albuterol (PROVENTIL HFA;VENTOLIN HFA) 108 (90 BASE) MCG/ACT inhaler Inhale 2 puffs into the lungs every 4 (four) hours as needed. For shortnes of breath      . aspirin 81 MG tablet Take 81 mg by mouth daily.       . Cholecalciferol (VITAMIN D) 2000 UNITS tablet Take 2,000 Units by mouth daily.      . cyclobenzaprine (FLEXERIL) 5 MG tablet Take 5 mg by mouth 3 (three) times daily as needed. For muscle spasm      . diltiazem (TIAZAC) 240 MG 24 hr capsule Take 240 mg by mouth every morning.       . Diphenhydramine-APAP (PERCOGESIC) 12.5-325 MG TABS Take 1 tablet by mouth every 6 (six) hours as needed. For pain      . ezetimibe (ZETIA) 10 MG tablet Take 10 mg by mouth every other day. AM      . Fluticasone-Salmeterol (ADVAIR) 100-50 MCG/DOSE AEPB Inhale 1 puff into the lungs every 12 (twelve) hours.       Marland Kitchen letrozole (FEMARA) 2.5 MG tablet Take 1 tablet (2.5 mg total) by mouth daily.  90 tablet  12  . levothyroxine (SYNTHROID, LEVOTHROID) 75 MCG tablet Take 75 mcg by mouth every morning.       . meloxicam (MOBIC) 7.5 MG tablet Take 5 mg by mouth as needed. For  knee tightness      . ondansetron (ZOFRAN) 4 MG tablet Take 4 mg by mouth every 6 (six) hours as needed. For nausea      . ranitidine (ZANTAC) 150 MG tablet Take 150 mg by mouth at bedtime.       . rosuvastatin (CRESTOR) 5 MG tablet Take 5 mg by mouth every other day. AM      . triamterene-hydrochlorothiazide (MAXZIDE-25) 37.5-25 MG per tablet Take 1 tablet by mouth every morning.       . vitamin B-12 (CYANOCOBALAMIN) 100 MCG tablet Take 50 mcg by mouth daily.      Marland Kitchen dexlansoprazole (DEXILANT) 60 MG capsule Take 60 mg by mouth every morning.      . lansoprazole (PREVACID) 30 MG capsule Take 30 mg by mouth 2 (two) times daily.        No current facility-administered medications for this encounter.    Physical Findings: The patient is in no acute distress. Patient is alert and oriented.  weight is 224 lb 14.4 oz (102.014 kg). Her oral temperature is 98.4 F (36.9 C). Her blood pressure is 140/72  and her pulse is 73. Her respiration is 16 and oxygen saturation is 100%. .  No palpable supraclavicular or axillary adenopathy. The lungs are clear to auscultation. The heart has a regular rhythm and rate. Examination of the left breast reveals no mass or nipple discharge. Examination of the right breast area reveals the nipple areolar complex area to be surgically absent. There is some induration throughout the anterior portion of the breast but no dominant masses appreciated. There are some hyperpigmentation changes throughout the breast area.  Lab Findings: Lab Results  Component Value Date   WBC 4.2 10/24/2012   HGB 13.4 10/24/2012   HCT 39.5 10/24/2012   MCV 89.6 10/24/2012   PLT 282 10/24/2012      Radiographic Findings:  She did undergo mammography in December of 2013 showing no suspicious area in either breast  Impression:  No evidence of recurrence on clinical exam today.   Plan:  When necessary followup. The patient will continue close followup in medical oncology and  surgery.  _____________________________________  -----------------------------------  Billie Lade, PhD, MD

## 2013-03-02 NOTE — Progress Notes (Signed)
Patient presents to the clinic today unaccompanied for follow up appointment with Dr. Roselind Messier. Patient alert and oriented to person, place, and time. No distress noted. Steady gait noted. Pleasant affect noted. Patient denies pain at this time. Patient denies nipple discharge from the left breast. Patient denies warmth, edema, or redness of either. Patient concerned about palpable lump under right axilla. Patient demonstrates full ROM of right arm. No edema of right arm noted. Skin of right breast normal color, texture, and appearance. Last mammogram done 12/02/2012. Patient taking Vitamin b12 for neuropathy in right hand and scheduled to see physician next week reference this matter. Patient continues to take femera as directed. Patient reports occasional nausea. Patient to see med onc 04/24/13. Patient denies night sweats or unintentional weight loss. Patient reports occasional dizziness but, denies headache. Reported all findings to Dr. Roselind Messier.

## 2013-03-16 ENCOUNTER — Telehealth: Payer: Self-pay | Admitting: Medical Oncology

## 2013-03-16 NOTE — Telephone Encounter (Signed)
Called patient left message to hold letrozole for a few weeks (2). She was asked to call is as to how she is feeling.

## 2013-03-16 NOTE — Telephone Encounter (Signed)
Patient called to report that the "letrozole is making my arthritis worse, especially at night, mostly in my hands with cramping and burning. I thought initially it was my carpel tunnel acting up and I saw Dr Cheree Ditto about it, but since it's in both hands, it can't be carpal tunnel." Patient states she takes meloxicam and aspirin and is asking if there is anything OTC she can take that won't interfere with those two?  Informed patient will review her concerns with Dr Welton Flakes and call her back with an answer.  Pt has sched appt 05/05 labs and Augustin Schooling, NP. Last seen 10/24/2012.

## 2013-04-10 ENCOUNTER — Telehealth (INDEPENDENT_AMBULATORY_CARE_PROVIDER_SITE_OTHER): Payer: Self-pay

## 2013-04-10 NOTE — Telephone Encounter (Signed)
The pt called and inquired about when her last mgm was.  I told her it was 12/02/2012

## 2013-04-24 ENCOUNTER — Encounter: Payer: Self-pay | Admitting: Adult Health

## 2013-04-24 ENCOUNTER — Telehealth: Payer: Self-pay | Admitting: Oncology

## 2013-04-24 ENCOUNTER — Other Ambulatory Visit (HOSPITAL_BASED_OUTPATIENT_CLINIC_OR_DEPARTMENT_OTHER): Payer: Medicare Other | Admitting: Lab

## 2013-04-24 ENCOUNTER — Ambulatory Visit (HOSPITAL_BASED_OUTPATIENT_CLINIC_OR_DEPARTMENT_OTHER): Payer: Medicare Other | Admitting: Adult Health

## 2013-04-24 VITALS — BP 147/72 | HR 83 | Temp 98.1°F | Resp 20 | Ht 64.5 in | Wt 224.8 lb

## 2013-04-24 DIAGNOSIS — C50911 Malignant neoplasm of unspecified site of right female breast: Secondary | ICD-10-CM

## 2013-04-24 DIAGNOSIS — C50419 Malignant neoplasm of upper-outer quadrant of unspecified female breast: Secondary | ICD-10-CM

## 2013-04-24 DIAGNOSIS — Z170 Estrogen receptor positive status [ER+]: Secondary | ICD-10-CM

## 2013-04-24 DIAGNOSIS — E559 Vitamin D deficiency, unspecified: Secondary | ICD-10-CM

## 2013-04-24 LAB — COMPREHENSIVE METABOLIC PANEL (CC13)
ALT: 17 U/L (ref 0–55)
Albumin: 3.6 g/dL (ref 3.5–5.0)
Alkaline Phosphatase: 77 U/L (ref 40–150)
CO2: 28 mEq/L (ref 22–29)
Glucose: 116 mg/dl — ABNORMAL HIGH (ref 70–99)
Potassium: 3.3 mEq/L — ABNORMAL LOW (ref 3.5–5.1)
Sodium: 140 mEq/L (ref 136–145)
Total Protein: 7.4 g/dL (ref 6.4–8.3)

## 2013-04-24 LAB — CBC WITH DIFFERENTIAL/PLATELET
Basophils Absolute: 0 10*3/uL (ref 0.0–0.1)
EOS%: 3.1 % (ref 0.0–7.0)
Eosinophils Absolute: 0.1 10*3/uL (ref 0.0–0.5)
HGB: 13 g/dL (ref 11.6–15.9)
NEUT#: 2.3 10*3/uL (ref 1.5–6.5)
RBC: 4.39 10*6/uL (ref 3.70–5.45)
RDW: 15.2 % — ABNORMAL HIGH (ref 11.2–14.5)
lymph#: 1.3 10*3/uL (ref 0.9–3.3)

## 2013-04-24 LAB — VITAMIN D 25 HYDROXY (VIT D DEFICIENCY, FRACTURES): Vit D, 25-Hydroxy: 27 ng/mL — ABNORMAL LOW (ref 30–89)

## 2013-04-24 NOTE — Progress Notes (Signed)
OFFICE PROGRESS NOTE  CC [name], MD [name], MD [address] [address]  DIAGNOSIS: 72 year old female with invasive ductal carcinoma of the right breast involving the right nipple diagnosed November 2012   PRIOR THERAPY:   #1 status post ultrasound-guided core biopsy of the mass at the 11:00 position 3 cm from the right nipple. The pathology revealed invasive mammary carcinoma with carcinoma in situ grade 2 tumor was estrogen receptor +100% progesterone receptor +3% proliferation marker 38% HER-2/neu negative.  #2 MRI of the breast showed 2.5 x 2.4 x 1.8 cm spiculated enhancing mass in the anterior aspect of the upper outer quadrant of the right breast with smaller linear and nodular enhancing masses extending to the biopsied mass to the right nipple.  #3 patient is status post punch biopsy of the right nipple with the pathology revealing an invasive mammary carcinoma as well.  #4 S/P 5 cycles of TC last given on 02/24/12  #5. Post chemotherapy MRI on 02/29/12 with decrease in size of the breast mass.  #6 patient is now status post right  lumpectomy with sentinel node biopsy. The final pathology revealed an invasive ductal carcinoma with lymphovascular space invasion tumor measured 1.9 cm 2 sentinel nodes were negative for metastatic disease ER positive PR positive HER-2/neu negative.  #7 patient is now status post radiation therapy to the right breast overall she tolerated it well. Her treatment ended on 07/11/2012.  #8 a she will begin adjuvant antiestrogen therapy with letrozole 2.5 mg daily starting 07/21/2012.  CURRENT THERAPY:  Letrozole 2.5 mg daily  INTERVAL HISTORY: [name] 72 y.o. female returns for followup visit today. She has taken about a 4 month break from the Letrozole due to joint aches/pains.  She decided to restart the Letrozole due to its importance and deals with the joint aches/pain by using Meloxicam.  She has occasional tolerable hot  flashes.  She recently had carpal tunnel surgery on her right hand on 03/31/13, and continues to have residual numbness in her fingertips, and this was apparent also after her chemotherapy.  Her left hand is without any numbness in her fingertips. She saw Dr. Amanda Pea for this and he recommended B6.  She will start taking this.  She also has a mild intermittent pain in what she thinks is the muscle near her right scapula.  She hasn't tried anything for it. She has mild dry skin, but otherwise is doing well.  Her health maintenance is updated below.   MEDICAL HISTORY: Past Medical History  Diagnosis Date  . GERD (gastroesophageal reflux disease)   . Asthma     daily and prn inhalers  . Grave's disease     no current meds.  . Arthritis     osteo - s/p right total knee  . Spondylosis     cervical and lumbar  . Hyperlipidemia   . Breast cancer 10/28/11    right breast  . Hypertension     under control with med.  . S/P chemotherapy, time since 4-12 weeks finished 02/20/2011  . Diabetes mellitus     diet-controlled  . History of chemotherapy     neoadjuvant: 5 cycles of Taxol/carboplatinum:  last dose 02/24/12  . Boil, axilla   . S/P radiation therapy 05/03/12 - 07/11/12    Right Breast High Axilla and Supraclavicular gegion: 4500 cGy/25 Fractions with boost for a total of 6300 cGy  . Use of letrozole (Femara) start 07/21/12  . Sleep apnea sleep study 09/18/2005  uses CPAP occ. - to bring machine DOS    ALLERGIES:  is allergic to lactose intolerance (gi); lipitor; and zocor.  MEDICATIONS:  Current Outpatient Prescriptions  Medication Sig Dispense Refill  . albuterol (PROVENTIL HFA;VENTOLIN HFA) 108 (90 BASE) MCG/ACT inhaler Inhale 2 puffs into the lungs every 4 (four) hours as needed. For shortnes of breath      . aspirin 81 MG tablet Take 81 mg by mouth daily.       . Cholecalciferol (VITAMIN D) 2000 UNITS tablet Take 2,000 Units by mouth daily.      . cyclobenzaprine (FLEXERIL) 5 MG tablet  Take 5 mg by mouth 3 (three) times daily as needed. For muscle spasm      . diltiazem (TIAZAC) 240 MG 24 hr capsule Take 240 mg by mouth every morning.       . ezetimibe (ZETIA) 10 MG tablet Take 10 mg by mouth every other day. AM      . Fluticasone-Salmeterol (ADVAIR) 100-50 MCG/DOSE AEPB Inhale 1 puff into the lungs every 12 (twelve) hours.       Marland Kitchen letrozole (FEMARA) 2.5 MG tablet Take 1 tablet (2.5 mg total) by mouth daily.  90 tablet  12  . meloxicam (MOBIC) 7.5 MG tablet Take 5 mg by mouth as needed. For knee tightness      . ondansetron (ZOFRAN) 4 MG tablet Take 4 mg by mouth every 6 (six) hours as needed. For nausea      . ranitidine (ZANTAC) 150 MG tablet Take 150 mg by mouth at bedtime.       . rosuvastatin (CRESTOR) 5 MG tablet Take 5 mg by mouth every other day. AM      . triamterene-hydrochlorothiazide (MAXZIDE-25) 37.5-25 MG per tablet Take 1 tablet by mouth every morning.       . vitamin B-12 (CYANOCOBALAMIN) 100 MCG tablet Take 50 mcg by mouth daily.      . Diphenhydramine-APAP (PERCOGESIC) 12.5-325 MG TABS Take 1 tablet by mouth every 6 (six) hours as needed. For pain      . HYDROcodone-acetaminophen (NORCO/VICODIN) 5-325 MG per tablet Take 1 tablet by mouth every 6 (six) hours as needed.      Marland Kitchen levothyroxine (SYNTHROID, LEVOTHROID) 75 MCG tablet Take 75 mcg by mouth every morning.        No current facility-administered medications for this visit.    SURGICAL HISTORY:  Past Surgical History  Procedure Laterality Date  . Lumbar laminectomy      back surg. x 2 - 1980's and 1999  . Total knee arthroplasty  07/28/2006    right  . Cataract extraction, bilateral    . Bilateral oophorectomy  2009  . Partial hysterectomy      BSO  . Cardiac catheterization  04/10/2010, 05/07/2004    LAD lesion, blood flow OK, per pt.  . Portacath placement  11/27/2011    Procedure: INSERTION PORT-A-CATH;  Surgeon: Ernestene Mention, MD;  Location: [facility];  Service: General;   Laterality: Right;  . Lesion removal  11/27/2011    Procedure: LESION REMOVAL;  Surgeon: Ernestene Mention, MD;  Location: [facility];  Service: General;  Laterality: Right;  Skin tag removed from under right eye. No specimen  . Abdominal hysterectomy    . Carpal tunnel release      left  . Mastectomy partial / lumpectomy  04/01/12    right breast, inv ductal ca, ER/PR  +, HER 2 -   .  Breast surgery    . Joint replacement  2006    RTK  . Esophagogastroduodenoscopy  12/20/2012    Procedure: ESOPHAGOGASTRODUODENOSCOPY (EGD);  Surgeon: Hart Carwin, MD;  Location: [facility];  Service: Endoscopy;  Laterality: N/A;  . Colonoscopy  12/20/2012    Procedure: COLONOSCOPY;  Surgeon: Hart Carwin, MD;  Location: [facility];  Service: Endoscopy;  Laterality: N/A;    REVIEW OF SYSTEMS: General: fatigue (-), night sweats (-), fever (-), pain (-) Lymph: palpable nodes (-) HEENT: vision changes (-), mucositis (-), gum bleeding (-), epistaxis (-) Cardiovascular: chest pain (-), palpitations (-) Pulmonary: shortness of breath (-), dyspnea on exertion (-), cough (-), hemoptysis (-) GI:  Early satiety (-), melena (-), dysphagia (-), nausea/vomiting (-), diarrhea (-) GU: dysuria (-), hematuria (-), incontinence (-) Musculoskeletal: joint swelling (-), joint pain (-), back pain (-) Neuro: weakness (-), numbness (-), headache (-), confusion (-) Skin: Rash (-), lesions (-), dryness (-) Psych: depression (-), suicidal/homicidal ideation (-), feeling of hopelessness (-)  Health Maintenance Mammogram: 12/02/12 normal Colonoscopy: 12/20/12, f/u in 5 years recommended Bone Density Scan:  03/08/12 Pap Smear: 2/13, patient will schedule Eye Exam: scheduled 04/27/13 Vitamin D Level: 10/24/12 Lipid Panel: 09/2012  PHYSICAL EXAM:  BP 147/72  Pulse 83  Temp(Src) 98.1 F (36.7 C) (Oral)  Resp 20  Ht 5' 4.5" (1.638 m)  Wt 224 lb 12.8 oz (101.969 kg)  BMI 38 kg/m2 General: Patient is a well  appearing female in no acute distress HEENT: PERRLA, sclerae anicteric no conjunctival pallor, MMM Neck: supple, no palpable adenopathy Lungs: clear to auscultation bilaterally, no wheezes, rhonchi, or rales Cardiovascular: regular rate rhythm, S1, S2, no murmurs, rubs or gallops Abdomen: Soft, non-tender, non-distended, normoactive bowel sounds, no HSM Extremities: warm and well perfused, no clubbing, cyanosis, or edema Skin: No rashes or lesions Neuro: Non-focal Breast examination left breast no masses nipple discharge right breast no masses nipple discharge no skin changes  ECOG PERFORMANCE STATUS: 0 - Asymptomatic   LABORATORY DATA: Lab Results  Component Value Date   WBC 4.0 04/24/2013   HGB 13.0 04/24/2013   HCT 38.7 04/24/2013   MCV 88.0 04/24/2013   PLT 301 04/24/2013      Chemistry      Component Value Date/Time   NA 140 04/24/2013 0904   NA 141 07/21/2012 1034   K 3.3* 04/24/2013 0904   K 3.9 07/21/2012 1034   CL 101 04/24/2013 0904   CL 101 07/21/2012 1034   CO2 28 04/24/2013 0904   CO2 34* 07/21/2012 1034   BUN 14.1 04/24/2013 0904   BUN 17 07/21/2012 1034   CREATININE 1.0 04/24/2013 0904   CREATININE 0.75 07/21/2012 1034      Component Value Date/Time   CALCIUM 9.6 04/24/2013 0904   CALCIUM 10.4 07/21/2012 1034   ALKPHOS 77 04/24/2013 0904   ALKPHOS 81 07/21/2012 1034   AST 15 04/24/2013 0904   AST 15 07/21/2012 1034   ALT 17 04/24/2013 0904   ALT 15 07/21/2012 1034   BILITOT 0.32 04/24/2013 0904   BILITOT 0.3 07/21/2012 1034     ADDITIONAL INFORMATION: 1. PROGNOSTIC INDICATORS - ACIS Results IMMUNOHISTOCHEMICAL AND MORPHOMETRIC ANALYSIS BY THE AUTOMATED CELLULAR IMAGING SYSTEM (ACIS) Estrogen Receptor (Negative, <1%): 90%, STRONG (MANUALLY) STAINING INTENSITY Progesterone Receptor (Negative, <1%): 34%, MODERATE STAINING INTENSITY All controls stained appropriately Pecola Leisure MD Pathologist, Electronic Signature ( Signed 04/10/2012) 1. CHROMOGENIC IN-SITU  HYBRIDIZATION Interpretation HER-2/NEU BY CISH - NO AMPLIFICATION OF HER-2 DETECTED. THE RATIO  OF HER-2: CEP 17 SIGNALS WAS 1.03. Reference range: Ratio: HER2:CEP17 < 1.8 - gene amplification not observed Ratio: HER2:CEP 17 1.8-2.2 - equivocal result Ratio: HER2:CEP17 > 2.2 - gene amplification observed Pecola Leisure MD Pathologist, Electronic Signature ( Signed 04/10/2012) 1 of 4 FINAL for JANICA, ELDRED ([id number]) FINAL DIAGNOSIS Diagnosis 1. Breast, lumpectomy, Central right - INVASIVE DUCTAL CARCINOMA (1.9 CM), SEE COMMENT. - INVASIVE TUMOR IS 0.5 CM FROM NEAREST MARGIN (SUPERIOR MARGIN) - LYMPHOVASCULAR AND PERINEURAL INVASION PRESENT. - TUMOR INVOLVES SKIN/NIPPLE - TUMOR INVOLVES NIPPLE DERMAL LYMPHATICS - DUCTAL CARCINOMA IN SITU - SEE TUMOR SYNOPTIC TEMPLATE BELOW 2. Lymph node, sentinel, biopsy, Right axillary - ONE LYMPH NODE, NEGATIVE FOR TUMOR (0/1) 3. Lymph node, sentinel, biopsy, Right axillary #2 - ONE LYMPH NODE, NEGATIVE FOR TUMOR (0/1) Microscopic Comment 1. BREAST, INVASIVE TUMOR, WITH LYMPH NODE SAMPLING Specimen, including laterality: Right breast Procedure: Lumpectomy Grade: II of III Tubule formation: 3 Nuclear pleomorphism: 2 Mitotic: 1 Tumor size (gross measurement): 1.9 cm Margins: Invasive, distance to closest margin: 0.5 cm In-situ, distance to closest margin: 0.5 cm If margin positive, focally or broadly: N/A Lymphovascular invasion: Present Ductal carcinoma in situ: Present Grade: II of III Extensive intraductal component: Absent Lobular neoplasia: Absent Tumor focality: Unifocal Treatment effect: Present If present, treatment effect in breast tissue, lymph nodes or both: Breast tissue and lymph nodes. Extent of tumor: Skin: Positive for tumor Nipple: Positive for tumor Skeletal muscle: N/A Lymph nodes: # examined: 2 number positive: 0 Breast prognostic profile: Estrogen receptor: Repeated, previous study demonstrated 100% positivity  ([id number]) Progesterone receptor: Repeated, previous study demonstrated 3% positivity ([id number]) Her 2 neu: Repeated, previous study demonstrated no amplification ([id number]). Ki-67: Not repeated, previous study demonstrated 38% proliferation rate ([id number]). Non-neoplastic breast: Neoadjuvant related change and calcifications. Previous biopsy site identified. TNM: ypT4, pN0, pMX Comments: None (CR:kh 04-05-12) [place] RUND DO Pathologist, Electronic Signature (Case signed 04/05/2012) 2 of 4   ASSESSMENT: 72 year old female with 1.  7.5 cm mass in the right breast with positive skin biopsy at the nipple of the right breast. Patient's tumor is ER positive PR positive HER-2/neu negative.   #2 s/p 5 cycles of neoadjuvant chemotherapy consisting of taxotere and cytoxan. Last cycle given on 02/24/12  #3 S/P right breast lumpectomy with sentinel node biopsy The final pathology revealed a 1.9 cm residual disease 2 sentinel nodes were negative for metastatic disease. Final staging is ypT4N0. Tumor is estrogen receptor positive progesterone receptor positive HER-2/neu negative.  #4 patient is now status post radiation therapy to the right breast completed on 07/11/2012.  #5 She is on  antiestrogen therapy with letrozole 2.5 mg daily. Risks and benefits of this treatment were discussed with the patient and information has been disseminated to her.   PLAN: #1 Doing well.  No sign of reccurence.  She will continue daily Letrozole therapy.    #2. Return in 6 months for follow up  #3 The next time she experiences the pain in her back, she will take meloxicam.  If it continues she will call us and we will arrange scans.    All questions were answered. The patient knows to call the clinic with any problems, questions or concerns. We can certainly see the patient much sooner if necessary.  I spent 25 minutes counseling the patient and coordinating care with a 30 minute appointment.  Cherie Ouch  Lyn Hollingshead, NP Medical Oncology [facility] Phone: [phone] 04/24/2013, 10:12 AM

## 2013-04-24 NOTE — Patient Instructions (Addendum)
Doing well.  No sign of recurrence.  Please call me if the back pain doesn't go away with your Meloxicam.  Continue daily letrozole.  We will see you back in 6 months.  Please call us if you have any questions or concerns.

## 2013-04-25 ENCOUNTER — Telehealth: Payer: Self-pay | Admitting: Medical Oncology

## 2013-04-25 MED ORDER — POTASSIUM CHLORIDE CRYS ER 20 MEQ PO TBCR: 20.0000 meq | EXTENDED_RELEASE_TABLET | Freq: Every day | ORAL | Status: DC

## 2013-04-25 MED ORDER — ERGOCALCIFEROL 1.25 MG (50000 UT) PO CAPS: 50000.0000 [IU] | ORAL_CAPSULE | ORAL | Status: DC

## 2013-04-25 NOTE — Telephone Encounter (Signed)
Per NP, informed patient her vit d level is low and for patient to stop taking vit d 2000 units daily and to start taking Ergocalciferol 50000 units by mouth 1 time a week for 4 weeks, and once she has finished the ergocalciferol prescription she is to start taking the vit d 2000 units daily, again.  Also informed pt of potassium level @ 3.2 and to take kdur 20 meq by mouth every day for 7 days. As well as for pt to follow up with her PCP regarding her potassium.  Patient expressed verbal understanding and requested to have her labs sent to her PCP. No further questions at this time.  Informed pt prescriptions will be sent to her pharmacy.

## 2013-04-25 NOTE — Telephone Encounter (Signed)
Message copied by Rexene Edison on Tue Apr 25, 2013  3:19 PM ------      Message from: Laural Golden      Created: Tue Apr 25, 2013  8:41 AM       Patient's vitamin d level is low.  She is taking 2000 units daily.  Please call in Ergocalciferol 50000 units PO weekly.  Disp. 4.  No refills. She should take 1 tablet weekly.  Please tell her to stop taking the 2,000 units daily and resume once she has finished the prescription.              Also, her potassium is 3.2.  Please call in Kdur po daily x 7 days disp 7 no refills.  Instruct her to call her PCP regarding her potassium and follow up for management.             Thanks,      L      ----- Message -----         From: [facility] Interface         Sent: 04/24/2013   9:13 AM           To: Victorino December, MD                   ------

## 2013-05-30 ENCOUNTER — Encounter: Payer: Self-pay | Admitting: *Deleted

## 2013-05-31 ENCOUNTER — Encounter: Payer: Self-pay | Admitting: Cardiovascular Disease

## 2013-05-31 ENCOUNTER — Ambulatory Visit (INDEPENDENT_AMBULATORY_CARE_PROVIDER_SITE_OTHER): Payer: Medicare Other | Admitting: Cardiovascular Disease

## 2013-05-31 VITALS — BP 116/68 | HR 78 | Resp 16 | Ht 65.0 in | Wt 222.8 lb

## 2013-05-31 DIAGNOSIS — I2510 Atherosclerotic heart disease of native coronary artery without angina pectoris: Secondary | ICD-10-CM

## 2013-05-31 DIAGNOSIS — G4730 Sleep apnea, unspecified: Secondary | ICD-10-CM

## 2013-05-31 DIAGNOSIS — E785 Hyperlipidemia, unspecified: Secondary | ICD-10-CM

## 2013-05-31 MED ORDER — ROSUVASTATIN CALCIUM 5 MG PO TABS: ORAL_TABLET | ORAL | Status: DC

## 2013-05-31 NOTE — Patient Instructions (Addendum)
Your physician recommends that you schedule a follow-up appointment in: 1 year. Take Crestor 5 mg once weekly on Wednesdays. Recheck lipid profile labs in 3 months.

## 2013-06-06 ENCOUNTER — Encounter: Payer: Self-pay | Admitting: Cardiovascular Disease

## 2013-06-06 DIAGNOSIS — I2510 Atherosclerotic heart disease of native coronary artery without angina pectoris: Secondary | ICD-10-CM | POA: Insufficient documentation

## 2013-06-06 DIAGNOSIS — E785 Hyperlipidemia, unspecified: Secondary | ICD-10-CM | POA: Insufficient documentation

## 2013-06-06 NOTE — Progress Notes (Signed)
Patient ID: [name], female   DOB: 01/30/1941, 72 y.o.   MRN: [id number]      Reason for office visit CAD  [name] is now 72 years old and has a known intermediate severity stenosis of the mid LAD artery. This was demonstrated by coronary angiography in 2011, but shown not to be hemodynamically important by pressure wire analysis. She had some problems with intrascapular pressure last year he performed a nuclear stress test that showed normal myocardial perfusion. Her symptoms resolved with treatment for gastroesophageal reflux disease.  It has been very challenging to find a way to reduce her elevated LDL cholesterol. She has tried numerous statins in the past and has been intolerant. For a while it seemed like we would get away with 5 mg Crestor every other day but after a few months she had to discontinue this as well. Otherwise her risk factors are well controlled.    Allergies  Allergen Reactions  . Crestor (Rosuvastatin) Other (See Comments)    crampin of legs and feet  . Lactose Intolerance (Gi) Other (See Comments)    ABD. CRAMPS  . Lipitor (Atorvastatin Calcium) Other (See Comments)    LEG CRAMPS  . Zocor (Simvastatin - High Dose) Other (See Comments)    Leg cramps    Current Outpatient Prescriptions  Medication Sig Dispense Refill  . albuterol (PROVENTIL HFA;VENTOLIN HFA) 108 (90 BASE) MCG/ACT inhaler Inhale 2 puffs into the lungs every 4 (four) hours as needed. For shortnes of breath      . aspirin 81 MG tablet Take 81 mg by mouth daily.       . Cholecalciferol (VITAMIN D) 2000 UNITS tablet Take 2,000 Units by mouth daily.      . cyclobenzaprine (FLEXERIL) 5 MG tablet Take 5 mg by mouth 3 (three) times daily as needed. For muscle spasm      . diltiazem (TIAZAC) 240 MG 24 hr capsule Take 240 mg by mouth every morning.       . Diphenhydramine-APAP (PERCOGESIC) 12.5-325 MG TABS Take 1 tablet by mouth every 6 (six) hours as needed. For pain      . ezetimibe (ZETIA) 10 MG  tablet Take 10 mg by mouth every other day. AM      . Fluticasone-Salmeterol (ADVAIR) 100-50 MCG/DOSE AEPB Inhale 1 puff into the lungs every 12 (twelve) hours.       Marland Kitchen HYDROcodone-acetaminophen (NORCO/VICODIN) 5-325 MG per tablet Take 1 tablet by mouth every 6 (six) hours as needed.      Marland Kitchen letrozole (FEMARA) 2.5 MG tablet Take 1 tablet (2.5 mg total) by mouth daily.  90 tablet  12  . meloxicam (MOBIC) 7.5 MG tablet Take 5 mg by mouth as needed. For knee tightness      . ondansetron (ZOFRAN) 4 MG tablet Take 4 mg by mouth every 6 (six) hours as needed. For nausea      . potassium chloride SA (K-DUR,KLOR-CON) 20 MEQ tablet Take 1 tablet (20 mEq total) by mouth daily. For 7 days.  7 tablet  0  . ranitidine (ZANTAC) 150 MG tablet Take 150 mg by mouth at bedtime.       . triamterene-hydrochlorothiazide (MAXZIDE-25) 37.5-25 MG per tablet Take 1 tablet by mouth every morning. 1/2 tab in the AM      . levothyroxine (SYNTHROID, LEVOTHROID) 75 MCG tablet Take 75 mcg by mouth every morning.       . rosuvastatin (CRESTOR) 5 MG tablet Take once a week.  Samples given.  14 tablet  0   No current facility-administered medications for this visit.    Past Medical History  Diagnosis Date  . GERD (gastroesophageal reflux disease)   . Asthma     daily and prn inhalers  . Grave's disease     no current meds.  . Arthritis     osteo - s/p right total knee  . Spondylosis     cervical and lumbar  . Hyperlipidemia   . Breast cancer 10/28/11    right breast  . Hypertension     under control with med.  . S/P chemotherapy, time since 4-12 weeks finished 02/20/2011  . Diabetes mellitus     diet-controlled  . History of chemotherapy     neoadjuvant: 5 cycles of Taxol/carboplatinum:  last dose 02/24/12  . Boil, axilla   . S/P radiation therapy 05/03/12 - 07/11/12    Right Breast High Axilla and Supraclavicular gegion: 4500 cGy/25 Fractions with boost for a total of 6300 cGy  . Use of letrozole (Femara) start 07/21/12   . Sleep apnea sleep study 09/18/2005    uses CPAP occ. - to bring machine DOS  . CAD (coronary artery disease)   . Hypothyroid     Past Surgical History  Procedure Laterality Date  . Lumbar laminectomy      back surg. x 2 - 1980's and 1999  . Total knee arthroplasty  07/28/2006    right  . Cataract extraction, bilateral    . Bilateral oophorectomy  2009  . Partial hysterectomy      BSO  . Cardiac catheterization  04/10/2010, 05/07/2004    LAD lesion, blood flow OK, per pt.  . Portacath placement  11/27/2011    Procedure: INSERTION PORT-A-CATH;  Surgeon: Ernestene Mention, MD;  Location: [facility];  Service: General;  Laterality: Right;  . Lesion removal  11/27/2011    Procedure: LESION REMOVAL;  Surgeon: Ernestene Mention, MD;  Location: [facility];  Service: General;  Laterality: Right;  Skin tag removed from under right eye. No specimen  . Abdominal hysterectomy    . Carpal tunnel release      left  . Mastectomy partial / lumpectomy  04/01/12    right breast, inv ductal ca, ER/PR  +, HER 2 -   . Breast surgery    . Joint replacement  2006    RTK  . Esophagogastroduodenoscopy  12/20/2012    Procedure: ESOPHAGOGASTRODUODENOSCOPY (EGD);  Surgeon: Hart Carwin, MD;  Location: [facility];  Service: Endoscopy;  Laterality: N/A;  . Colonoscopy  12/20/2012    Procedure: COLONOSCOPY;  Surgeon: Hart Carwin, MD;  Location: [facility];  Service: Endoscopy;  Laterality: N/A;  . US echocardiography  08/14/2008    technically difficult-mild LVH, EF =>55%,mild annular ca+,AOV mildly sclerotic  . Nm myoview ltd  08/02/2012    no ischemia    Family History  Problem Relation Age of Onset  . Cancer Maternal Grandfather     unaware  . Anesthesia problems Cousin     pt. states she is not aware of any family anes. prob., does not know where this comment came from  . Cancer Cousin     breast, brain mets  . Cancer Cousin     ovarian, age 22-50  . Cancer Cousin      pancreatic    History   Social History  . Marital Status: Single    Spouse Name: N/A  Number of Children: N/A  . Years of Education: N/A   Occupational History  . retired    Social History Main Topics  . Smoking status: Never Smoker   . Smokeless tobacco: Never Used  . Alcohol Use: No  . Drug Use: No  . Sexually Active: Not Currently   Other Topics Concern  . 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 No other focal neurological complaints. Mild left ankle swelling which she attributes to orthopedic problems The patient specifically denies any chest pain at rest or with exertion, dyspnea at rest or with exertion, orthopnea, paroxysmal nocturnal dyspnea, syncope, palpitations, intermittent claudication, lower extremity edema, unexplained weight gain, cough, hemoptysis or wheezing.  The patient also denies abdominal pain, nausea, vomiting, dysphagia, diarrhea, constipation, polyuria, polydipsia, dysuria, hematuria, frequency, urgency, abnormal bleeding or bruising, fever, chills, unexpected weight changes, mood swings, change in skin or hair texture, change in voice quality, auditory or visual problems, allergic reactions or rashes, new musculoskeletal complaints other than usual "aches and pains".   PHYSICAL EXAM BP 116/68  Pulse 78  Resp 16  Ht 5\' 5"  (1.651 m)  Wt 101.061 kg (222 lb 12.8 oz)  BMI 37.08 kg/m2  General: Alert, oriented x3, no distress, moderately obese Head: no evidence of trauma, PERRL, EOMI, no exophtalmos or lid lag, no myxedema, no xanthelasma; normal ears, nose and oropharynx Neck: normal jugular venous pulsations and no hepatojugular reflux; brisk carotid pulses without delay and no carotid bruits Chest: clear to auscultation, no signs of  consolidation by percussion or palpation, normal fremitus, symmetrical and full respiratory excursions Cardiovascular: normal position and quality of the apical impulse, regular rhythm, normal first and second heart sounds, no murmurs, rubs or gallops Abdomen: no tenderness or distention, no masses by palpation, no abnormal pulsatility or arterial bruits, normal bowel sounds, no hepatosplenomegaly Extremities: no clubbing, cyanosis; trivial left ankle edema; 2+ radial, ulnar and brachial pulses bilaterally; 2+ right femoral, posterior tibial and dorsalis pedis pulses; 2+ left femoral, posterior tibial and dorsalis pedis pulses; no subclavian or femoral bruits Neurological: grossly nonfocal   EKG: Normal sinus rhythm poor R wave progression across the anterior precordium  Lipid Panel  While on treatment Crestor total cholesterol 180, triglycerides 88, HDL 62, LDL 100  BMET    Component Value Date/Time   NA 140 04/24/2013 0904   NA 141 07/21/2012 1034   K 3.3* 04/24/2013 0904   K 3.9 07/21/2012 1034   CL 101 04/24/2013 0904   CL 101 07/21/2012 1034   CO2 28 04/24/2013 0904   CO2 34* 07/21/2012 1034   GLUCOSE 116* 04/24/2013 0904   GLUCOSE 104* 07/21/2012 1034   BUN 14.1 04/24/2013 0904   BUN 17 07/21/2012 1034   CREATININE 1.0 04/24/2013 0904   CREATININE 0.75 07/21/2012 1034   CALCIUM 9.6 04/24/2013 0904   CALCIUM 10.4 07/21/2012 1034   GFRNONAA 71* 03/31/2012 0910   GFRAA 83* 03/31/2012 0910     ASSESSMENT AND PLAN CAD (coronary artery disease) Currently not symptomatic. Angiography in 2011 showed 50% mid LAD artery stenosis with a fractional flow reserve of 0.87, therefore deemed hemodynamically not significant; nuclear perfusion imaging in August of 2013. Left ventricular ejection fraction 68%. Risk factor modification remains the prime target of care, lowering  of LDL cholesterol being the most important goal.  Hyperlipidemia, poorly tolerant of statins While on every other day Crestor 5 mg her total  cholesterol is 180 and the LDL was 100, acceptable values. Fortunately, she has had to stop taking the Crestor due to muscle complaints. She has been intolerant of simvastatin and atorvastatin and could only take every other day Crestor for a brief period of time without muscle cramps and weakness. She is willing to try to take Crestor once weekly and I gave her enough samples to last her for several months. If she succeeds in taking this we will recheck a lipid profile in roughly 3 months.  Sleep apnea Imperfect compliance with CPAP  Orders Placed This Encounter  Procedures  . Lipid Profile  . EKG 12-Lead   Meds ordered this encounter  Medications  . DISCONTD: rosuvastatin (CRESTOR) 5 MG tablet    Sig: Take once a week. Samples given.    Dispense:  21 tablet    Refill:  3  . rosuvastatin (CRESTOR) 5 MG tablet    Sig: Take once a week. Samples given.    Dispense:  14 tablet    Refill:  0    Aadhira Heffernan  Thurmon Fair, MD, [facility] [phone] office [phone] pager

## 2013-06-06 NOTE — Assessment & Plan Note (Addendum)
Currently not symptomatic. Angiography in 2011 showed 50% mid LAD artery stenosis with a fractional flow reserve of 0.87, therefore deemed hemodynamically not significant; nuclear perfusion imaging in August of 2013. Left ventricular ejection fraction 68%. Risk factor modification remains the prime target of care, lowering of LDL cholesterol being the most important goal.

## 2013-06-06 NOTE — Assessment & Plan Note (Signed)
Imperfect compliance with CPAP

## 2013-06-06 NOTE — Assessment & Plan Note (Signed)
While on every other day Crestor 5 mg her total cholesterol is 180 and the LDL was 100, acceptable values. Fortunately, she has had to stop taking the Crestor due to muscle complaints. She has been intolerant of simvastatin and atorvastatin and could only take every other day Crestor for a brief period of time without muscle cramps and weakness. She is willing to try to take Crestor once weekly and I gave her enough samples to last her for several months. If she succeeds in taking this we will recheck a lipid profile in roughly 3 months.

## 2013-09-11 ENCOUNTER — Telehealth: Payer: Self-pay | Admitting: Medical Oncology

## 2013-09-11 ENCOUNTER — Encounter: Payer: Self-pay | Admitting: Medical Oncology

## 2013-09-11 NOTE — Telephone Encounter (Signed)
Patient called wanting to update medication list d/t taking new meds for rheumatoid arthritis; taking methotrexate, folic acid and prednisone.  Medication list updated. Patient with no other questions or concerns.

## 2013-10-27 ENCOUNTER — Other Ambulatory Visit (HOSPITAL_BASED_OUTPATIENT_CLINIC_OR_DEPARTMENT_OTHER): Payer: Medicare Other | Admitting: Lab

## 2013-10-27 ENCOUNTER — Telehealth: Payer: Self-pay | Admitting: Oncology

## 2013-10-27 ENCOUNTER — Encounter: Payer: Self-pay | Admitting: Adult Health

## 2013-10-27 ENCOUNTER — Ambulatory Visit (HOSPITAL_BASED_OUTPATIENT_CLINIC_OR_DEPARTMENT_OTHER): Payer: Medicare Other | Admitting: Adult Health

## 2013-10-27 VITALS — BP 138/78 | HR 79 | Temp 98.3°F | Resp 18 | Ht 65.0 in | Wt 219.4 lb

## 2013-10-27 DIAGNOSIS — C50419 Malignant neoplasm of upper-outer quadrant of unspecified female breast: Secondary | ICD-10-CM

## 2013-10-27 DIAGNOSIS — C50911 Malignant neoplasm of unspecified site of right female breast: Secondary | ICD-10-CM

## 2013-10-27 DIAGNOSIS — Z170 Estrogen receptor positive status [ER+]: Secondary | ICD-10-CM

## 2013-10-27 DIAGNOSIS — E559 Vitamin D deficiency, unspecified: Secondary | ICD-10-CM

## 2013-10-27 LAB — COMPREHENSIVE METABOLIC PANEL (CC13)
ALT: 17 U/L (ref 0–55)
AST: 17 U/L (ref 5–34)
Albumin: 3.8 g/dL (ref 3.5–5.0)
Anion Gap: 11 mEq/L (ref 3–11)
BUN: 17.1 mg/dL (ref 7.0–26.0)
CO2: 28 mEq/L (ref 22–29)
Calcium: 10.3 mg/dL (ref 8.4–10.4)
Chloride: 102 mEq/L (ref 98–109)
Creatinine: 0.9 mg/dL (ref 0.6–1.1)
Glucose: 128 mg/dl (ref 70–140)
Total Bilirubin: 0.37 mg/dL (ref 0.20–1.20)

## 2013-10-27 LAB — CBC WITH DIFFERENTIAL/PLATELET
BASO%: 1.2 % (ref 0.0–2.0)
Eosinophils Absolute: 0.1 10*3/uL (ref 0.0–0.5)
HCT: 37.8 % (ref 34.8–46.6)
LYMPH%: 25.6 % (ref 14.0–49.7)
MCH: 30.9 pg (ref 25.1–34.0)
MCHC: 33.8 g/dL (ref 31.5–36.0)
MONO#: 0.3 10*3/uL (ref 0.1–0.9)
NEUT#: 2.5 10*3/uL (ref 1.5–6.5)
NEUT%: 63 % (ref 38.4–76.8)
Platelets: 306 10*3/uL (ref 145–400)
WBC: 4 10*3/uL (ref 3.9–10.3)

## 2013-10-27 NOTE — Telephone Encounter (Signed)
, °

## 2013-10-27 NOTE — Progress Notes (Signed)
OFFICE PROGRESS NOTE  CC Jasmine Kelp, MD Jasmine Kayser, MD [address] [address]  DIAGNOSIS: 72 year old female with invasive ductal carcinoma of the right breast involving the right nipple diagnosed November 2012   PRIOR THERAPY:   #1 status post ultrasound-guided core biopsy of the mass at the 11:00 position 3 cm from the right nipple. The pathology revealed invasive mammary carcinoma with carcinoma in situ grade 2 tumor was estrogen receptor +100% progesterone receptor +3% proliferation marker 38% HER-2/neu negative.  #2 MRI of the breast showed 2.5 x 2.4 x 1.8 cm spiculated enhancing mass in the anterior aspect of the upper outer quadrant of the right breast with smaller linear and nodular enhancing masses extending to the biopsied mass to the right nipple.  #3 patient is status post punch biopsy of the right nipple with the pathology revealing an invasive mammary carcinoma as well.  #4 S/P 5 cycles of TC last given on 02/24/12  #5. Post chemotherapy MRI on 02/29/12 with decrease in size of the breast mass.  #6 patient is now status post right  lumpectomy with sentinel node biopsy. The final pathology revealed an invasive ductal carcinoma with lymphovascular space invasion tumor measured 1.9 cm 2 sentinel nodes were negative for metastatic disease ER positive PR positive HER-2/neu negative.  #7 patient is now status post radiation therapy to the right breast overall she tolerated it well. Her treatment ended on 07/11/2012.  #8 a she will begin adjuvant antiestrogen therapy with letrozole 2.5 mg daily starting 07/21/2012.  CURRENT THERAPY:  Letrozole 2.5 mg daily  INTERVAL HISTORY: [name] 72 y.o. female returns for followup visit today. She has been having difficulty with rheumatoid arthritis lately.  She is taking the Letrozole daily and tolerating it well.  She has occasional tolerable hot flashes, and denies vaginal dryness.  She attributes her joint aches to  her recently diagnosed rheumatoid arthritis.  Otherwise a 10 point ROS is neg.  Her health maintenance was updated below.    MEDICAL HISTORY: Past Medical History  Diagnosis Date  . GERD (gastroesophageal reflux disease)   . Asthma     daily and prn inhalers  . Grave's disease     no current meds.  . Arthritis     osteo - s/p right total knee  . Spondylosis     cervical and lumbar  . Hyperlipidemia   . Breast cancer 10/28/11    right breast  . Hypertension     under control with med.  . S/P chemotherapy, time since 4-12 weeks finished 02/20/2011  . Diabetes mellitus     diet-controlled  . History of chemotherapy     neoadjuvant: 5 cycles of Taxol/carboplatinum:  last dose 02/24/12  . Boil, axilla   . S/P radiation therapy 05/03/12 - 07/11/12    Right Breast High Axilla and Supraclavicular gegion: 4500 cGy/25 Fractions with boost for a total of 6300 cGy  . Use of letrozole (Femara) start 07/21/12  . Sleep apnea sleep study 09/18/2005    uses CPAP occ. - to bring machine DOS  . CAD (coronary artery disease)   . Hypothyroid     ALLERGIES:  is allergic to crestor; lactose intolerance (gi); lipitor; and zocor.  MEDICATIONS:  Current Outpatient Prescriptions  Medication Sig Dispense Refill  . albuterol (PROVENTIL HFA;VENTOLIN HFA) 108 (90 BASE) MCG/ACT inhaler Inhale 2 puffs into the lungs every 4 (four) hours as needed. For shortnes of breath      . aspirin 81  MG tablet Take 81 mg by mouth daily.       . Cholecalciferol (VITAMIN D) 2000 UNITS tablet Take 2,000 Units by mouth daily.      . cyclobenzaprine (FLEXERIL) 5 MG tablet Take 5 mg by mouth 3 (three) times daily as needed. For muscle spasm      . ezetimibe (ZETIA) 10 MG tablet Take 10 mg by mouth every other day. AM      . Fluticasone-Salmeterol (ADVAIR) 100-50 MCG/DOSE AEPB Inhale 1 puff into the lungs every 12 (twelve) hours.       . folic acid (FOLVITE) 1 MG tablet Take 1 mg by mouth daily.      Marland Kitchen HYDROcodone-acetaminophen  (NORCO/VICODIN) 5-325 MG per tablet Take 1 tablet by mouth every 6 (six) hours as needed.      Marland Kitchen letrozole (FEMARA) 2.5 MG tablet Take 1 tablet (2.5 mg total) by mouth daily.  90 tablet  12  . levothyroxine (SYNTHROID, LEVOTHROID) 75 MCG tablet Take 75 mcg by mouth every morning.       . meloxicam (MOBIC) 7.5 MG tablet Take 5 mg by mouth as needed. For knee tightness      . methotrexate (RHEUMATREX) 2.5 MG tablet Take 15 mg by mouth once a week. Caution:Chemotherapy. Protect from light.      . ondansetron (ZOFRAN) 4 MG tablet Take 4 mg by mouth every 6 (six) hours as needed. For nausea      . predniSONE (STERAPRED UNI-PAK) 10 MG tablet Take 5 mg by mouth daily. Started with 5mg  4 times a day x 1 week, then 3 times a day x 1 week, then 2 times a day x1 week, then 1 time a day x 1 week.      . ranitidine (ZANTAC) 150 MG tablet Take 150 mg by mouth at bedtime.       . rosuvastatin (CRESTOR) 5 MG tablet Take once a week. Samples given.  14 tablet  0  . triamterene-hydrochlorothiazide (MAXZIDE-25) 37.5-25 MG per tablet Take 1 tablet by mouth every morning. 1/2 tab in the AM      . diltiazem (TIAZAC) 240 MG 24 hr capsule Take 240 mg by mouth every morning.        No current facility-administered medications for this visit.    SURGICAL HISTORY:  Past Surgical History  Procedure Laterality Date  . Lumbar laminectomy      back surg. x 2 - 1980's and 1999  . Total knee arthroplasty  07/28/2006    right  . Cataract extraction, bilateral    . Bilateral oophorectomy  2009  . Partial hysterectomy      BSO  . Cardiac catheterization  04/10/2010, 05/07/2004    LAD lesion, blood flow OK, per pt.  . Portacath placement  11/27/2011    Procedure: INSERTION PORT-A-CATH;  Surgeon: Ernestene Mention, MD;  Location: [facility];  Service: General;  Laterality: Right;  . Lesion removal  11/27/2011    Procedure: LESION REMOVAL;  Surgeon: Ernestene Mention, MD;  Location: [facility];   Service: General;  Laterality: Right;  Skin tag removed from under right eye. No specimen  . Abdominal hysterectomy    . Carpal tunnel release      left  . Mastectomy partial / lumpectomy  04/01/12    right breast, inv ductal ca, ER/PR  +, HER 2 -   . Breast surgery    . Joint replacement  2006    RTK  .  Esophagogastroduodenoscopy  12/20/2012    Procedure: ESOPHAGOGASTRODUODENOSCOPY (EGD);  Surgeon: Hart Carwin, MD;  Location: [facility];  Service: Endoscopy;  Laterality: N/A;  . Colonoscopy  12/20/2012    Procedure: COLONOSCOPY;  Surgeon: Hart Carwin, MD;  Location: [facility];  Service: Endoscopy;  Laterality: N/A;  . US echocardiography  08/14/2008    technically difficult-mild LVH, EF =>55%,mild annular ca+,AOV mildly sclerotic  . Nm myoview ltd  08/02/2012    no ischemia    REVIEW OF SYSTEMS: A 10 point review of systems was conducted and is otherwise negative except for what is noted above.     Health Maintenance Mammogram: 12/02/12 normal Colonoscopy: 12/20/12, f/u in 5 years recommended Bone Density Scan:  03/08/12 Pap Smear: 07/2013 Eye Exam: scheduled 04/27/13 Vitamin D Level: 04/24/13 Lipid Panel: 09/2012  PHYSICAL EXAM:  BP 138/78  Pulse 79  Temp(Src) 98.3 F (36.8 C) (Oral)  Resp 18  Ht 5\' 5"  (1.651 m)  Wt 219 lb 6.4 oz (99.519 kg)  BMI 36.51 kg/m2 General: Patient is a well appearing female in no acute distress HEENT: PERRLA, sclerae anicteric no conjunctival pallor, MMM Neck: supple, no palpable adenopathy Lungs: clear to auscultation bilaterally, no wheezes, rhonchi, or rales Cardiovascular: regular rate rhythm, S1, S2, no murmurs, rubs or gallops Abdomen: Soft, non-tender, non-distended, normoactive bowel sounds, no HSM Extremities: warm and well perfused, no clubbing, cyanosis, or edema Skin: No rashes or lesions Neuro: Non-focal Breast examination left breast no masses nipple discharge right breast no masses nipple discharge no skin changes  ECOG  PERFORMANCE STATUS: 0 - Asymptomatic   LABORATORY DATA: Lab Results  Component Value Date   WBC 4.0 10/27/2013   HGB 12.8 10/27/2013   HCT 37.8 10/27/2013   MCV 91.4 10/27/2013   PLT 306 10/27/2013      Chemistry      Component Value Date/Time   NA 141 10/27/2013 0906   NA 141 07/21/2012 1034   K 3.6 10/27/2013 0906   K 3.9 07/21/2012 1034   CL 101 04/24/2013 0904   CL 101 07/21/2012 1034   CO2 28 10/27/2013 0906   CO2 34* 07/21/2012 1034   BUN 17.1 10/27/2013 0906   BUN 17 07/21/2012 1034   CREATININE 0.9 10/27/2013 0906   CREATININE 0.75 07/21/2012 1034      Component Value Date/Time   CALCIUM 10.3 10/27/2013 0906   CALCIUM 10.4 07/21/2012 1034   ALKPHOS 76 10/27/2013 0906   ALKPHOS 81 07/21/2012 1034   AST 17 10/27/2013 0906   AST 15 07/21/2012 1034   ALT 17 10/27/2013 0906   ALT 15 07/21/2012 1034   BILITOT 0.37 10/27/2013 0906   BILITOT 0.3 07/21/2012 1034     ADDITIONAL INFORMATION: 1. PROGNOSTIC INDICATORS - ACIS Results IMMUNOHISTOCHEMICAL AND MORPHOMETRIC ANALYSIS BY THE AUTOMATED CELLULAR IMAGING SYSTEM (ACIS) Estrogen Receptor (Negative, <1%): 90%, STRONG (MANUALLY) STAINING INTENSITY Progesterone Receptor (Negative, <1%): 34%, MODERATE STAINING INTENSITY All controls stained appropriately Pecola Leisure MD Pathologist, Electronic Signature ( Signed 04/10/2012) 1. CHROMOGENIC IN-SITU HYBRIDIZATION Interpretation HER-2/NEU BY CISH - NO AMPLIFICATION OF HER-2 DETECTED. THE RATIO OF HER-2: CEP 17 SIGNALS WAS 1.03. Reference range: Ratio: HER2:CEP17 < 1.8 - gene amplification not observed Ratio: HER2:CEP 17 1.8-2.2 - equivocal result Ratio: HER2:CEP17 > 2.2 - gene amplification observed Pecola Leisure MD Pathologist, Electronic Signature ( Signed 04/10/2012) 1 of 4 FINAL for Jasmine, Gutierrez ([id number]) FINAL DIAGNOSIS Diagnosis 1. Breast, lumpectomy, Central right - INVASIVE DUCTAL CARCINOMA (1.9 CM), SEE COMMENT. - INVASIVE  TUMOR IS 0.5 CM FROM NEAREST MARGIN (SUPERIOR MARGIN) -  LYMPHOVASCULAR AND PERINEURAL INVASION PRESENT. - TUMOR INVOLVES SKIN/NIPPLE - TUMOR INVOLVES NIPPLE DERMAL LYMPHATICS - DUCTAL CARCINOMA IN SITU - SEE TUMOR SYNOPTIC TEMPLATE BELOW 2. Lymph node, sentinel, biopsy, Right axillary - ONE LYMPH NODE, NEGATIVE FOR TUMOR (0/1) 3. Lymph node, sentinel, biopsy, Right axillary #2 - ONE LYMPH NODE, NEGATIVE FOR TUMOR (0/1) Microscopic Comment 1. BREAST, INVASIVE TUMOR, WITH LYMPH NODE SAMPLING Specimen, including laterality: Right breast Procedure: Lumpectomy Grade: II of III Tubule formation: 3 Nuclear pleomorphism: 2 Mitotic: 1 Tumor size (gross measurement): 1.9 cm Margins: Invasive, distance to closest margin: 0.5 cm In-situ, distance to closest margin: 0.5 cm If margin positive, focally or broadly: N/A Lymphovascular invasion: Present Ductal carcinoma in situ: Present Grade: II of III Extensive intraductal component: Absent Lobular neoplasia: Absent Tumor focality: Unifocal Treatment effect: Present If present, treatment effect in breast tissue, lymph nodes or both: Breast tissue and lymph nodes. Extent of tumor: Skin: Positive for tumor Nipple: Positive for tumor Skeletal muscle: N/A Lymph nodes: # examined: 2 number positive: 0 Breast prognostic profile: Estrogen receptor: Repeated, previous study demonstrated 100% positivity ([id number]) Progesterone receptor: Repeated, previous study demonstrated 3% positivity ([id number]) Her 2 neu: Repeated, previous study demonstrated no amplification ([id number]). Ki-67: Not repeated, previous study demonstrated 38% proliferation rate ([id number]). Non-neoplastic breast: Neoadjuvant related change and calcifications. Previous biopsy site identified. TNM: ypT4, pN0, pMX Comments: None (CR:kh 04-05-12) [place] RUND DO Pathologist, Electronic Signature (Case signed 04/05/2012) 2 of 4   ASSESSMENT: 72 year old female with 1.  7.5 cm mass in the right breast with positive skin  biopsy at the nipple of the right breast. Patient's tumor is ER positive PR positive HER-2/neu negative.   #2 s/p 5 cycles of neoadjuvant chemotherapy consisting of taxotere and cytoxan. Last cycle given on 02/24/12  #3 S/P right breast lumpectomy with sentinel node biopsy The final pathology revealed a 1.9 cm residual disease 2 sentinel nodes were negative for metastatic disease. Final staging is [id number]. Tumor is estrogen receptor positive progesterone receptor positive HER-2/neu negative.  #4 patient is now status post radiation therapy to the right breast completed on 07/11/2012.  #5 She is on  antiestrogen therapy with letrozole 2.5 mg daily. Risks and benefits of this treatment were discussed with the patient and information has been disseminated to her.   PLAN: #1 Doing well.  No sign of reccurence.  She will continue daily Letrozole therapy.  We discussed survivorship.  #2  We reviewed her health maintenance in detail.  She is on vitamin D supplementation, and will schedule her mammogram in December of this year.    #3 She will return in 6 months for follow up.      All questions were answered. The patient knows to call the clinic with any problems, questions or concerns. We can certainly see the patient much sooner if necessary.  I spent 25 minutes counseling the patient and coordinating care with a 30 minute appointment.  Illa Level, NP Medical Oncology [facility] [phone] 10/28/2013, 10:47 AM

## 2013-10-27 NOTE — Patient Instructions (Signed)
Doing well.  No sign of recurrence.  Continue Letrozole daily.  We will see you back in 6 months.  Please call us if you have any questions or concerns.

## 2013-11-20 ENCOUNTER — Telehealth: Payer: Self-pay | Admitting: *Deleted

## 2013-11-20 NOTE — Telephone Encounter (Signed)
Pt called requested to be seen by MD at next visit. States She needs to see MD for her next f/u even if the date of appt has to change, there are some things she needs to talk with MD about." POF sent to scheduling. Message forward to provider.

## 2013-11-20 NOTE — Telephone Encounter (Signed)
She can see me, when is her next appointment. She most likely will need to see me in January

## 2013-11-22 ENCOUNTER — Telehealth: Payer: Self-pay | Admitting: Oncology

## 2013-11-22 NOTE — Telephone Encounter (Signed)
Pts phone busy unable to reach Worked 12/1 POF discussed with Melissa appt made for 1st available Jan slot w [facility] Cal mailed to pt shh

## 2013-11-24 ENCOUNTER — Other Ambulatory Visit (INDEPENDENT_AMBULATORY_CARE_PROVIDER_SITE_OTHER): Payer: Self-pay | Admitting: General Surgery

## 2013-11-24 DIAGNOSIS — Z853 Personal history of malignant neoplasm of breast: Secondary | ICD-10-CM

## 2013-11-24 DIAGNOSIS — Z9889 Other specified postprocedural states: Secondary | ICD-10-CM

## 2013-12-07 ENCOUNTER — Encounter (INDEPENDENT_AMBULATORY_CARE_PROVIDER_SITE_OTHER): Payer: Medicare Other | Admitting: General Surgery

## 2013-12-07 ENCOUNTER — Ambulatory Visit
Admission: RE | Admit: 2013-12-07 | Discharge: 2013-12-07 | Disposition: A | Discharge status: Home / Self Care | Payer: Medicare Other | Source: Ambulatory Visit | Attending: General Surgery | Admitting: General Surgery

## 2013-12-07 DIAGNOSIS — Z853 Personal history of malignant neoplasm of breast: Secondary | ICD-10-CM

## 2013-12-07 DIAGNOSIS — Z9889 Other specified postprocedural states: Secondary | ICD-10-CM

## 2013-12-26 ENCOUNTER — Encounter (INDEPENDENT_AMBULATORY_CARE_PROVIDER_SITE_OTHER): Payer: Medicare Other | Admitting: General Surgery

## 2014-01-17 ENCOUNTER — Other Ambulatory Visit: Payer: Self-pay | Admitting: Emergency Medicine

## 2014-01-17 DIAGNOSIS — C50911 Malignant neoplasm of unspecified site of right female breast: Secondary | ICD-10-CM

## 2014-01-17 DIAGNOSIS — C50419 Malignant neoplasm of upper-outer quadrant of unspecified female breast: Secondary | ICD-10-CM

## 2014-01-18 ENCOUNTER — Encounter (INDEPENDENT_AMBULATORY_CARE_PROVIDER_SITE_OTHER): Payer: Self-pay | Admitting: General Surgery

## 2014-01-18 ENCOUNTER — Other Ambulatory Visit (HOSPITAL_BASED_OUTPATIENT_CLINIC_OR_DEPARTMENT_OTHER): Payer: Medicare Other

## 2014-01-18 ENCOUNTER — Telehealth: Payer: Self-pay | Admitting: Oncology

## 2014-01-18 ENCOUNTER — Ambulatory Visit (HOSPITAL_BASED_OUTPATIENT_CLINIC_OR_DEPARTMENT_OTHER): Payer: Medicare Other | Admitting: Oncology

## 2014-01-18 ENCOUNTER — Encounter (INDEPENDENT_AMBULATORY_CARE_PROVIDER_SITE_OTHER): Payer: Self-pay

## 2014-01-18 ENCOUNTER — Encounter: Payer: Self-pay | Admitting: Oncology

## 2014-01-18 ENCOUNTER — Ambulatory Visit (INDEPENDENT_AMBULATORY_CARE_PROVIDER_SITE_OTHER): Payer: Medicare Other | Admitting: General Surgery

## 2014-01-18 VITALS — BP 138/76 | HR 72 | Temp 97.7°F | Resp 18 | Ht 65.0 in | Wt 227.4 lb

## 2014-01-18 VITALS — BP 127/71 | HR 80 | Temp 98.2°F | Resp 18 | Ht 65.0 in | Wt 228.2 lb

## 2014-01-18 DIAGNOSIS — M069 Rheumatoid arthritis, unspecified: Secondary | ICD-10-CM

## 2014-01-18 DIAGNOSIS — C50419 Malignant neoplasm of upper-outer quadrant of unspecified female breast: Secondary | ICD-10-CM

## 2014-01-18 DIAGNOSIS — Z170 Estrogen receptor positive status [ER+]: Secondary | ICD-10-CM

## 2014-01-18 DIAGNOSIS — C50919 Malignant neoplasm of unspecified site of unspecified female breast: Secondary | ICD-10-CM

## 2014-01-18 DIAGNOSIS — C50911 Malignant neoplasm of unspecified site of right female breast: Secondary | ICD-10-CM

## 2014-01-18 DIAGNOSIS — G5790 Unspecified mononeuropathy of unspecified lower limb: Secondary | ICD-10-CM

## 2014-01-18 LAB — CBC WITH DIFFERENTIAL/PLATELET
BASO%: 0.9 % (ref 0.0–2.0)
Basophils Absolute: 0.1 10*3/uL (ref 0.0–0.1)
EOS%: 1.1 % (ref 0.0–7.0)
Eosinophils Absolute: 0.1 10*3/uL (ref 0.0–0.5)
HEMATOCRIT: 40.8 % (ref 34.8–46.6)
HGB: 13.6 g/dL (ref 11.6–15.9)
LYMPH#: 1.1 10*3/uL (ref 0.9–3.3)
LYMPH%: 17.5 % (ref 14.0–49.7)
MCH: 31.7 pg (ref 25.1–34.0)
MCHC: 33.3 g/dL (ref 31.5–36.0)
MCV: 95.3 fL (ref 79.5–101.0)
MONO#: 0.6 10*3/uL (ref 0.1–0.9)
MONO%: 9.2 % (ref 0.0–14.0)
NEUT#: 4.5 10*3/uL (ref 1.5–6.5)
NEUT%: 71.3 % (ref 38.4–76.8)
Platelets: 298 10*3/uL (ref 145–400)
RBC: 4.28 10*6/uL (ref 3.70–5.45)
RDW: 16.1 % — ABNORMAL HIGH (ref 11.2–14.5)
WBC: 6.3 10*3/uL (ref 3.9–10.3)

## 2014-01-18 LAB — COMPREHENSIVE METABOLIC PANEL (CC13)
ALT: 25 U/L (ref 0–55)
AST: 22 U/L (ref 5–34)
Albumin: 4.2 g/dL (ref 3.5–5.0)
Alkaline Phosphatase: 87 U/L (ref 40–150)
Anion Gap: 11 mEq/L (ref 3–11)
BUN: 17 mg/dL (ref 7.0–26.0)
CALCIUM: 10 mg/dL (ref 8.4–10.4)
CHLORIDE: 102 meq/L (ref 98–109)
CO2: 27 meq/L (ref 22–29)
Creatinine: 1.1 mg/dL (ref 0.6–1.1)
Glucose: 113 mg/dl (ref 70–140)
Potassium: 3.6 mEq/L (ref 3.5–5.1)
SODIUM: 140 meq/L (ref 136–145)
Total Bilirubin: 0.36 mg/dL (ref 0.20–1.20)
Total Protein: 7.8 g/dL (ref 6.4–8.3)

## 2014-01-18 NOTE — Progress Notes (Signed)
OFFICE PROGRESS NOTE  CC Jasmine Skates, MD Jasmine Ly, MD [place] [address]  DIAGNOSIS: 73 year old female with invasive ductal carcinoma of the right breast involving the right nipple diagnosed November 2012   PRIOR THERAPY:   #1 status post ultrasound-guided core biopsy of the mass at the 11:00 position 3 cm from the right nipple. The pathology revealed invasive mammary carcinoma with carcinoma in situ grade 2 tumor was estrogen receptor +100% progesterone receptor +3% proliferation marker 38% HER-2/neu negative.  #2 MRI of the breast showed 2.5 x 2.4 x 1.8 cm spiculated enhancing mass in the anterior aspect of the upper outer quadrant of the right breast with smaller linear and nodular enhancing masses extending to the biopsied mass to the right nipple.  #3 patient is status post punch biopsy of the right nipple with the pathology revealing an invasive mammary carcinoma as well.  #4 S/P 5 cycles of TC last given on 02/24/12  #5. Post chemotherapy MRI on 02/29/12 with decrease in size of the breast mass.  #6 patient is now status post right  lumpectomy with sentinel node biopsy. The final pathology revealed an invasive ductal carcinoma with lymphovascular space invasion tumor measured 1.9 cm 2 sentinel nodes were negative for metastatic disease ER positive PR positive HER-2/neu negative.  #7 patient is now status post radiation therapy to the right breast overall she tolerated it well. Her treatment ended on 07/11/2012.  #8 a she will begin adjuvant antiestrogen therapy with letrozole 2.5 mg daily starting 07/21/2012.  CURRENT THERAPY:  Letrozole 2.5 mg daily  INTERVAL HISTORY: [name] 73 y.o. female returns for followup visit today. She has been having difficulty with rheumatoid arthritis lately. She is now on methotrexate She is taking the Letrozole daily and tolerating it well.  She has occasional tolerable hot flashes, and denies vaginal dryness.  She  attributes her joint aches to her recently diagnosed rheumatoid arthritis.  Otherwise a 10 point ROS is neg.  Her health maintenance was updated below.    MEDICAL HISTORY: Past Medical History  Diagnosis Date  . GERD (gastroesophageal reflux disease)   . Asthma     daily and prn inhalers  . Grave's disease     no current meds.  . Arthritis     osteo - s/p right total knee  . Spondylosis     cervical and lumbar  . Hyperlipidemia   . Breast cancer 10/28/11    right breast  . Hypertension     under control with med.  . S/P chemotherapy, time since 4-12 weeks finished 02/20/2011  . Diabetes mellitus     diet-controlled  . History of chemotherapy     neoadjuvant: 5 cycles of Taxol/carboplatinum:  last dose 02/24/12  . Boil, axilla   . S/P radiation therapy 05/03/12 - 07/11/12    Right Breast High Axilla and Supraclavicular gegion: 4500 cGy/25 Fractions with boost for a total of 6300 cGy  . Use of letrozole (Femara) start 07/21/12  . Sleep apnea sleep study 09/18/2005    uses CPAP occ. - to bring machine DOS  . CAD (coronary artery disease)   . Hypothyroid     ALLERGIES:  is allergic to crestor; lactose intolerance (gi); lipitor; and zocor.  MEDICATIONS:  Current Outpatient Prescriptions  Medication Sig Dispense Refill  . albuterol (PROVENTIL HFA;VENTOLIN HFA) 108 (90 BASE) MCG/ACT inhaler Inhale 2 puffs into the lungs every 4 (four) hours as needed. For shortnes of breath      .  aspirin 81 MG tablet Take 81 mg by mouth daily.       . Cholecalciferol (VITAMIN D) 2000 UNITS tablet Take 2,000 Units by mouth daily.      Marland Kitchen diltiazem (TIAZAC) 240 MG 24 hr capsule Take 240 mg by mouth every morning.       . ezetimibe (ZETIA) 10 MG tablet Take 10 mg by mouth every other day. AM      . Fluticasone-Salmeterol (ADVAIR) 100-50 MCG/DOSE AEPB Inhale 1 puff into the lungs every 12 (twelve) hours.       . folic acid (FOLVITE) 1 MG tablet Take 1 mg by mouth daily.      Marland Kitchen letrozole (FEMARA) 2.5 MG  tablet Take 1 tablet (2.5 mg total) by mouth daily.  90 tablet  12  . levothyroxine (SYNTHROID, LEVOTHROID) 75 MCG tablet Take 75 mcg by mouth every morning.       . meloxicam (MOBIC) 7.5 MG tablet Take 5 mg by mouth as needed. For knee tightness      . methotrexate (RHEUMATREX) 2.5 MG tablet Take 15 mg by mouth once a week. Caution:Chemotherapy. Protect from light.      . ondansetron (ZOFRAN) 4 MG tablet Take 4 mg by mouth every 6 (six) hours as needed. For nausea      . ranitidine (ZANTAC) 150 MG tablet Take 150 mg by mouth at bedtime.       . rosuvastatin (CRESTOR) 5 MG tablet Take once a week. Samples given.  14 tablet  0  . triamterene-hydrochlorothiazide (MAXZIDE-25) 37.5-25 MG per tablet Take 1 tablet by mouth every morning. 1/2 tab in the AM       No current facility-administered medications for this visit.    SURGICAL HISTORY:  Past Surgical History  Procedure Laterality Date  . Lumbar laminectomy      back surg. x 2 - 1980's and 1999  . Total knee arthroplasty  07/28/2006    right  . Cataract extraction, bilateral    . Bilateral oophorectomy  2009  . Partial hysterectomy      BSO  . Cardiac catheterization  04/10/2010, 05/07/2004    LAD lesion, blood flow OK, per pt.  . Portacath placement  11/27/2011    Procedure: INSERTION PORT-A-CATH;  Surgeon: Adin Hector, MD;  Location: [facility];  Service: General;  Laterality: Right;  . Lesion removal  11/27/2011    Procedure: LESION REMOVAL;  Surgeon: Adin Hector, MD;  Location: [facility];  Service: General;  Laterality: Right;  Skin tag removed from under right eye. No specimen  . Abdominal hysterectomy    . Carpal tunnel release      left  . Mastectomy partial / lumpectomy  04/01/12    right breast, inv ductal ca, ER/PR  +, HER 2 -   . Breast surgery    . Joint replacement  2006    RTK  . Esophagogastroduodenoscopy  12/20/2012    Procedure: ESOPHAGOGASTRODUODENOSCOPY (EGD);  Surgeon: Lafayette Dragon, MD;  Location: [facility];  Service: Endoscopy;  Laterality: N/A;  . Colonoscopy  12/20/2012    Procedure: COLONOSCOPY;  Surgeon: Lafayette Dragon, MD;  Location: [facility];  Service: Endoscopy;  Laterality: N/A;  . US echocardiography  08/14/2008    technically difficult-mild LVH, EF =>55%,mild annular ca+,AOV mildly sclerotic  . Nm myoview ltd  08/02/2012    no ischemia    REVIEW OF SYSTEMS: A 10 point review of systems was conducted and  is otherwise negative except for what is noted above.     Health Maintenance Mammogram: 12/02/12 normal Colonoscopy: 12/20/12, f/u in 5 years recommended Bone Density Scan:  03/08/12 Pap Smear: 07/2013 Eye Exam: scheduled 04/27/13 Vitamin D Level: 04/24/13 Lipid Panel: 09/2012  PHYSICAL EXAM:  BP 127/71  Pulse 80  Temp(Src) 98.2 F (36.8 C) (Oral)  Resp 18  Ht '5\' 5"'  (1.651 m)  Wt 228 lb 3.2 oz (103.511 kg)  BMI 37.97 kg/m2 General: Patient is a well appearing female in no acute distress HEENT: PERRLA, sclerae anicteric no conjunctival pallor, MMM Neck: supple, no palpable adenopathy Lungs: clear to auscultation bilaterally, no wheezes, rhonchi, or rales Cardiovascular: regular rate rhythm, S1, S2, no murmurs, rubs or gallops Abdomen: Soft, non-tender, non-distended, normoactive bowel sounds, no HSM Extremities: warm and well perfused, no clubbing, cyanosis, or edema Skin: No rashes or lesions Neuro: Non-focal Breast examination left breast no masses nipple discharge right breast no masses nipple discharge no skin changes  ECOG PERFORMANCE STATUS: 0 - Asymptomatic   LABORATORY DATA: Lab Results  Component Value Date   WBC 6.3 01/18/2014   HGB 13.6 01/18/2014   HCT 40.8 01/18/2014   MCV 95.3 01/18/2014   PLT 298 01/18/2014      Chemistry      Component Value Date/Time   NA 140 01/18/2014 1328   NA 141 07/21/2012 1034   K 3.6 01/18/2014 1328   K 3.9 07/21/2012 1034   CL 101 04/24/2013 0904   CL 101 07/21/2012 1034   CO2 27 01/18/2014  1328   CO2 34* 07/21/2012 1034   BUN 17.0 01/18/2014 1328   BUN 17 07/21/2012 1034   CREATININE 1.1 01/18/2014 1328   CREATININE 0.75 07/21/2012 1034      Component Value Date/Time   CALCIUM 10.0 01/18/2014 1328   CALCIUM 10.4 07/21/2012 1034   ALKPHOS 87 01/18/2014 1328   ALKPHOS 81 07/21/2012 1034   AST 22 01/18/2014 1328   AST 15 07/21/2012 1034   ALT 25 01/18/2014 1328   ALT 15 07/21/2012 1034   BILITOT 0.36 01/18/2014 1328   BILITOT 0.3 07/21/2012 1034     ADDITIONAL INFORMATION: 1. PROGNOSTIC INDICATORS - ACIS Results IMMUNOHISTOCHEMICAL AND MORPHOMETRIC ANALYSIS BY THE AUTOMATED CELLULAR IMAGING SYSTEM (ACIS) Estrogen Receptor (Negative, <1%): 90%, STRONG (MANUALLY) STAINING INTENSITY Progesterone Receptor (Negative, <1%): 34%, MODERATE STAINING INTENSITY All controls stained appropriately Enid Cutter MD Pathologist, Electronic Signature ( Signed 04/10/2012) 1. CHROMOGENIC IN-SITU HYBRIDIZATION Interpretation HER-2/NEU BY CISH - NO AMPLIFICATION OF HER-2 DETECTED. THE RATIO OF HER-2: CEP 17 SIGNALS WAS 1.03. Reference range: Ratio: HER2:CEP17 < 1.8 - gene amplification not observed Ratio: HER2:CEP 17 1.8-2.2 - equivocal result Ratio: HER2:CEP17 > 2.2 - gene amplification observed Enid Cutter MD Pathologist, Electronic Signature ( Signed 04/10/2012) 1 of 4 FINAL for GRETEL, CANTU ([id number]) FINAL DIAGNOSIS Diagnosis 1. Breast, lumpectomy, Central right - INVASIVE DUCTAL CARCINOMA (1.9 CM), SEE COMMENT. - INVASIVE TUMOR IS 0.5 CM FROM NEAREST MARGIN (SUPERIOR MARGIN) - LYMPHOVASCULAR AND PERINEURAL INVASION PRESENT. - TUMOR INVOLVES SKIN/NIPPLE - TUMOR INVOLVES NIPPLE DERMAL LYMPHATICS - DUCTAL CARCINOMA IN SITU - SEE TUMOR SYNOPTIC TEMPLATE BELOW 2. Lymph node, sentinel, biopsy, Right axillary - ONE LYMPH NODE, NEGATIVE FOR TUMOR (0/1) 3. Lymph node, sentinel, biopsy, Right axillary #2 - ONE LYMPH NODE, NEGATIVE FOR TUMOR (0/1) Microscopic Comment 1. BREAST, INVASIVE TUMOR,  WITH LYMPH NODE SAMPLING Specimen, including laterality: Right breast Procedure: Lumpectomy Grade: II of III Tubule formation: 3 Nuclear pleomorphism: 2 Mitotic:  1 Tumor size (gross measurement): 1.9 cm Margins: Invasive, distance to closest margin: 0.5 cm In-situ, distance to closest margin: 0.5 cm If margin positive, focally or broadly: N/A Lymphovascular invasion: Present Ductal carcinoma in situ: Present Grade: II of III Extensive intraductal component: Absent Lobular neoplasia: Absent Tumor focality: Unifocal Treatment effect: Present If present, treatment effect in breast tissue, lymph nodes or both: Breast tissue and lymph nodes. Extent of tumor: Skin: Positive for tumor Nipple: Positive for tumor Skeletal muscle: N/A Lymph nodes: # examined: 2 number positive: 0 Breast prognostic profile: Estrogen receptor: Repeated, previous study demonstrated 100% positivity ([id number]) Progesterone receptor: Repeated, previous study demonstrated 3% positivity ([id number]) Her 2 neu: Repeated, previous study demonstrated no amplification ([id number]). Ki-67: Not repeated, previous study demonstrated 38% proliferation rate ([id number]). Non-neoplastic breast: Neoadjuvant related change and calcifications. Previous biopsy site identified. TNM: ypT4, pN0, pMX Comments: None (CR:kh 04-05-12) Mali RUND DO Pathologist, Electronic Signature (Case signed 04/05/2012) 2 of 4   ASSESSMENT/PLAN: 73 year old female with  #1 clinical stage III invasive ductal carcinoma of the right breast with skin involvement. Originally presenting in 2012. The tumor was ER positive PR positive HER-2/neu negative. She went on to receive neoadjuvant chemotherapy consisting of 5 cycles of Taxotere Cytoxan completed 02/24/2012. Thereafter she had a right breast lumpectomy with sentinel lymph node biopsy. With the final pathology revealing a 1.9 cm residual disease 2 sentinel nodes were negative for metastatic  disease. Her final staging was T4 N0. Again the tumor was ER positive PR positive HER-2/neu negative. Patient received adjuvant radiation therapy to the right breast completed 07/11/2012. Thereafter she was begun on antiestrogen therapy with letrozole 2.5 mg daily starting in August 2013. Overall she has been tolerating the letrozole very nicely without any significant problems.  #2 peripheral neuropathy in the feet: Likely secondary to her chemotherapy that was given in 2013. She does state that it is improving. But she occasionally does get it.  #3 asthma: Patient does have some wheezing on her chest exam. She does have allergies as well. She will continue to take her prescribed medications.  #4 patient had a mammogram performed this month. postop changes no evidence of recurrent disease.  #5 arthritis: Patient does have rheumatoid arthritis. She tells me she is now on methotrexate she's tolerating it well. Her blood work looks good.  #6 patient will be seen back in 6 months time. She will continue letrozole as prescribed and call with any problems.    All questions were answered. The patient knows to call the clinic with any problems, questions or concerns. We can certainly see the patient much sooner if necessary.  I spent 20 minutes counseling the patient and coordinating care with a 30 minute appointment.     Marcy Panning, MD Medical/Oncology [facility] [phone] (beeper) [phone] (Office)  01/18/2014, 2:48 PM

## 2014-01-18 NOTE — Telephone Encounter (Signed)
, °

## 2014-01-18 NOTE — Progress Notes (Signed)
Patient ID: [name], female   DOB: 25-May-1941, 73 y.o.   MRN: [id number] History: [name] is a 73 y.o. female. She returns for long-term followup regarding her right breast cancer.  One year ago, in November 2012 She was diagnosed with receptor positive cancer of the right breast centrally involving the areola. Port-A-Cath was inserted and she underwent neoadjuvant chemotherapy. On 04/01/2012 she underwent right partial mastectomy and sentinel lymph node biopsy. This was this central Lumpectomy removing the nipple and areola. Pathologic staging at that point was ypT4, ypN0. She recovered from the surgery and had adjuvant radiation therapy. I removed her port on 08/01/2012.  Her mammograms of both breasts were performed on 12/07/2013  and they looked fine, category 2.  She is told tolerating the letrozole No real complaints about her breast. . No arm swelling or numbness.\ She has been diagnosed with rheumatoid arthritis and is now on methotrexate. 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 She is a retired [employer] scientist.  Past history, social history, family history, and review of systems are documented on the chart, unchanged, and not contributory except as described above.   Physical Exam: Constitutional: She is oriented to person, place, and time. She appears well-developed and well-nourished. No distress.  HENT:  Head: Normocephalic and atraumatic.  Neck: Neck supple. No JVD present. No tracheal deviation present. No thyromegaly present.  Cardiovascular: Normal rate, regular rhythm, normal heart sounds and intact distal pulses.  No murmur heard.  Pulmonary/Chest: Effort normal and breath sounds normal. No respiratory distress. She has no wheezes. She has no rales. She exhibits no tenderness.  Transverse incision right breast. Surgical absence of the nipple and areola. Right breast is smaller than the left. Some tenting of the skin. No palpable mass  in either breast. No axillary adenopathy on either side.    Assessment: Receptor positive invasive cancer right breast, no evidence of recurrence 1 year following neoadjuvant chemotherapy, right partial mastectomy sentinel node biopsy and radiation therapy  Negative genic testing for genetic  mutation.  Obesity  Sleep apnea  Asthma  Hypertension  Coronary artery disease.  Plan: Continue letrozole Return to see me in one year after annual mammograms.   Edsel Petrin. Dalbert Batman, M.D., [facility] General and Minimally invasive Surgery Breast and Colorectal Surgery Office:   [phone] Pager:   [phone]

## 2014-01-18 NOTE — Patient Instructions (Signed)
Examination of both breasts and all lymph node areas are normal. Your mammograms from last month are also normal. There is no evidence of cancer.  Continue to take the letrozole.  Return to see Dr. Dalbert Batman in one year after her annual mammograms.

## 2014-04-09 NOTE — Telephone Encounter (Signed)
Please see Visit Info comments 

## 2014-04-20 ENCOUNTER — Ambulatory Visit: Payer: Medicare Other | Admitting: Oncology

## 2014-04-20 ENCOUNTER — Other Ambulatory Visit: Payer: Medicare Other

## 2014-04-23 ENCOUNTER — Telehealth: Payer: Self-pay | Admitting: Cardiovascular Disease

## 2014-04-24 NOTE — Telephone Encounter (Signed)
Closed encounter °

## 2014-05-03 ENCOUNTER — Ambulatory Visit: Payer: Medicare Other | Admitting: Adult Health

## 2014-05-03 ENCOUNTER — Other Ambulatory Visit: Payer: Medicare Other | Admitting: Lab

## 2014-06-28 ENCOUNTER — Telehealth: Payer: Self-pay | Admitting: Hematology and Oncology

## 2014-06-28 NOTE — Telephone Encounter (Signed)
, °

## 2014-07-16 ENCOUNTER — Ambulatory Visit: Payer: Medicare Other | Admitting: Oncology

## 2014-07-16 ENCOUNTER — Other Ambulatory Visit: Payer: Medicare Other

## 2014-07-27 ENCOUNTER — Other Ambulatory Visit: Payer: Self-pay | Admitting: *Deleted

## 2014-07-27 DIAGNOSIS — C50919 Malignant neoplasm of unspecified site of unspecified female breast: Secondary | ICD-10-CM

## 2014-07-27 DIAGNOSIS — C50911 Malignant neoplasm of unspecified site of right female breast: Secondary | ICD-10-CM

## 2014-07-30 ENCOUNTER — Other Ambulatory Visit (HOSPITAL_BASED_OUTPATIENT_CLINIC_OR_DEPARTMENT_OTHER): Payer: Medicare Other

## 2014-07-30 ENCOUNTER — Ambulatory Visit (HOSPITAL_BASED_OUTPATIENT_CLINIC_OR_DEPARTMENT_OTHER): Payer: Medicare Other | Admitting: Hematology

## 2014-07-30 ENCOUNTER — Encounter: Payer: Self-pay | Admitting: Hematology

## 2014-07-30 VITALS — BP 162/77 | HR 89 | Temp 98.1°F | Resp 20 | Ht 65.0 in | Wt 234.9 lb

## 2014-07-30 DIAGNOSIS — M069 Rheumatoid arthritis, unspecified: Secondary | ICD-10-CM

## 2014-07-30 DIAGNOSIS — G5790 Unspecified mononeuropathy of unspecified lower limb: Secondary | ICD-10-CM

## 2014-07-30 DIAGNOSIS — C50911 Malignant neoplasm of unspecified site of right female breast: Secondary | ICD-10-CM

## 2014-07-30 DIAGNOSIS — C50419 Malignant neoplasm of upper-outer quadrant of unspecified female breast: Secondary | ICD-10-CM

## 2014-07-30 DIAGNOSIS — Z170 Estrogen receptor positive status [ER+]: Secondary | ICD-10-CM

## 2014-07-30 DIAGNOSIS — C50919 Malignant neoplasm of unspecified site of unspecified female breast: Secondary | ICD-10-CM

## 2014-07-30 LAB — CBC WITH DIFFERENTIAL/PLATELET
BASO%: 0.5 % (ref 0.0–2.0)
Basophils Absolute: 0 10*3/uL (ref 0.0–0.1)
EOS%: 1.8 % (ref 0.0–7.0)
Eosinophils Absolute: 0.1 10*3/uL (ref 0.0–0.5)
HCT: 37.1 % (ref 34.8–46.6)
HGB: 12.3 g/dL (ref 11.6–15.9)
LYMPH#: 1.3 10*3/uL (ref 0.9–3.3)
LYMPH%: 23.1 % (ref 14.0–49.7)
MCH: 30.6 pg (ref 25.1–34.0)
MCHC: 33.2 g/dL (ref 31.5–36.0)
MCV: 92.3 fL (ref 79.5–101.0)
MONO#: 0.3 10*3/uL (ref 0.1–0.9)
MONO%: 5.1 % (ref 0.0–14.0)
NEUT#: 3.9 10*3/uL (ref 1.5–6.5)
NEUT%: 69.5 % (ref 38.4–76.8)
Platelets: 257 10*3/uL (ref 145–400)
RBC: 4.02 10*6/uL (ref 3.70–5.45)
RDW: 15.1 % — AB (ref 11.2–14.5)
WBC: 5.7 10*3/uL (ref 3.9–10.3)

## 2014-07-30 LAB — COMPREHENSIVE METABOLIC PANEL
ALK PHOS: 74 U/L (ref 39–117)
ALT: 16 U/L (ref 0–35)
AST: 14 U/L (ref 0–37)
Albumin: 4 g/dL (ref 3.5–5.2)
BUN: 18 mg/dL (ref 6–23)
CALCIUM: 9.7 mg/dL (ref 8.4–10.5)
CHLORIDE: 100 meq/L (ref 96–112)
CO2: 29 mEq/L (ref 19–32)
CREATININE: 0.84 mg/dL (ref 0.50–1.10)
Glucose, Bld: 162 mg/dL — ABNORMAL HIGH (ref 70–99)
Potassium: 3.4 mEq/L — ABNORMAL LOW (ref 3.5–5.3)
Sodium: 139 mEq/L (ref 135–145)
Total Bilirubin: 0.3 mg/dL (ref 0.2–1.2)
Total Protein: 6.7 g/dL (ref 6.0–8.3)

## 2014-07-30 NOTE — Progress Notes (Signed)
OFFICE PROGRESS NOTE  CC Jasmine Skates, MD Jasmine Ly, MD [place] [address]  DIAGNOSIS: 73 year old female with invasive ductal carcinoma of the right breast involving the right nipple diagnosed November 2012 for follow up visit.  PRIOR THERAPY:   #1 status post ultrasound-guided core biopsy of the mass at the 11:00 position 3 cm from the right nipple. The pathology revealed invasive mammary carcinoma with carcinoma in situ grade 2 tumor was estrogen receptor +100% progesterone receptor +3% proliferation marker 38% HER-2/neu negative.  #2 MRI of the breast showed 2.5 x 2.4 x 1.8 cm spiculated enhancing mass in the anterior aspect of the upper outer quadrant of the right breast with smaller linear and nodular enhancing masses extending to the biopsied mass to the right nipple.     #3 patient is status post punch biopsy of the right nipple with the pathology revealing an invasive mammary carcinoma as well.  #4 S/P 5 cycles of TC last given on 02/24/12  #5. Post chemotherapy MRI on 02/29/12 with decrease in size of the breast mass.    #6 patient is now status post right  lumpectomy with sentinel node biopsy. The final pathology revealed an invasive ductal carcinoma with lymphovascular space invasion tumor measured 1.9 cm 2 sentinel nodes were negative for metastatic disease ER positive PR positive HER-2/neu negative.  #7 patient is now status post radiation therapy to the right breast overall she tolerated it well. Her treatment ended on 07/11/2012.  #8 a she started adjuvant antiestrogen therapy with letrozole 2.5 mg daily starting 07/21/2012.  CURRENT THERAPY:  Letrozole 2.5 mg daily  INTERVAL HISTORY: [name] 73 y.o. female returns for followup visit today.  [name] is doing well. She sees a rheumatologist for her rheumatoid arthritis and they're going to switch her to subcutaneous methotrexate. She's not very compliant with doing breast exam monthly and she  has lumpy breast. She does take letrozole regularly. She is really worried about her granddaughter who at the age 96 was found to have a sarcoma which is now stage IV disease with chest and brain involvement. Patient has multiple family members with cancer but her own BRCA testing was negative. She still works part-time as a Actuary.She has occasional tolerable hot flashes, and denies vaginal dryness.  She attributes her joint aches to her recently diagnosed rheumatoid arthritis.  Otherwise a 10 point ROS is neg.  Her health maintenance was updated below.    MEDICAL HISTORY: Past Medical History  Diagnosis Date  . GERD (gastroesophageal reflux disease)   . Asthma     daily and prn inhalers  . Grave's disease     no current meds.  . Arthritis     osteo - s/p right total knee  . Spondylosis     cervical and lumbar  . Hyperlipidemia   . Breast cancer 10/28/11    right breast  . Hypertension     under control with med.  . S/P chemotherapy, time since 4-12 weeks finished 02/20/2011  . Diabetes mellitus     diet-controlled  . History of chemotherapy     neoadjuvant: 5 cycles of Taxol/carboplatinum:  last dose 02/24/12  . Boil, axilla   . S/P radiation therapy 05/03/12 - 07/11/12    Right Breast High Axilla and Supraclavicular gegion: 4500 cGy/25 Fractions with boost for a total of 6300 cGy  . Use of letrozole (Femara) start 07/21/12  . Sleep apnea sleep study 09/18/2005    uses CPAP occ. -  to bring machine DOS  . CAD (coronary artery disease)   . Hypothyroid     ALLERGIES:  is allergic to crestor; lactose intolerance (gi); lipitor; and zocor.  MEDICATIONS:  Current Outpatient Prescriptions  Medication Sig Dispense Refill  . albuterol (PROVENTIL HFA;VENTOLIN HFA) 108 (90 BASE) MCG/ACT inhaler Inhale 2 puffs into the lungs every 4 (four) hours as needed. For shortnes of breath      . aspirin 81 MG tablet Take 81 mg by mouth daily.       . Cholecalciferol (VITAMIN D) 2000 UNITS tablet Take 2,000  Units by mouth daily.      Marland Kitchen diltiazem (TIAZAC) 240 MG 24 hr capsule Take 240 mg by mouth every morning.       . Fluticasone-Salmeterol (ADVAIR) 100-50 MCG/DOSE AEPB Inhale 1 puff into the lungs every 12 (twelve) hours.       . folic acid (FOLVITE) 1 MG tablet Take 1 mg by mouth daily.      Marland Kitchen letrozole (FEMARA) 2.5 MG tablet Take 1 tablet (2.5 mg total) by mouth daily.  90 tablet  12  . meloxicam (MOBIC) 7.5 MG tablet Take 5 mg by mouth as needed. For knee tightness      . methotrexate (RHEUMATREX) 2.5 MG tablet Take 15 mg by mouth once a week. Caution:Chemotherapy. Protect from light.      . ondansetron (ZOFRAN) 4 MG tablet Take 4 mg by mouth every 6 (six) hours as needed. For nausea      . ranitidine (ZANTAC) 150 MG tablet Take 150 mg by mouth at bedtime.       . rosuvastatin (CRESTOR) 5 MG tablet Take once a week. Samples given.  14 tablet  0  . triamterene-hydrochlorothiazide (MAXZIDE-25) 37.5-25 MG per tablet Take 1 tablet by mouth every morning. 1/2 tab in the AM       No current facility-administered medications for this visit.    SURGICAL HISTORY:  Past Surgical History  Procedure Laterality Date  . Lumbar laminectomy      back surg. x 2 - 1980's and 1999  . Total knee arthroplasty  07/28/2006    right  . Cataract extraction, bilateral    . Bilateral oophorectomy  2009  . Partial hysterectomy      BSO  . Cardiac catheterization  04/10/2010, 05/07/2004    LAD lesion, blood flow OK, per pt.  . Portacath placement  11/27/2011    Procedure: INSERTION PORT-A-CATH;  Surgeon: Adin Hector, MD;  Location: [facility];  Service: General;  Laterality: Right;  . Lesion removal  11/27/2011    Procedure: LESION REMOVAL;  Surgeon: Adin Hector, MD;  Location: [facility];  Service: General;  Laterality: Right;  Skin tag removed from under right eye. No specimen  . Abdominal hysterectomy    . Carpal tunnel release      left  . Mastectomy partial / lumpectomy   04/01/12    right breast, inv ductal ca, ER/PR  +, HER 2 -   . Breast surgery    . Joint replacement  2006    RTK  . Esophagogastroduodenoscopy  12/20/2012    Procedure: ESOPHAGOGASTRODUODENOSCOPY (EGD);  Surgeon: Lafayette Dragon, MD;  Location: [facility];  Service: Endoscopy;  Laterality: N/A;  . Colonoscopy  12/20/2012    Procedure: COLONOSCOPY;  Surgeon: Lafayette Dragon, MD;  Location: [facility];  Service: Endoscopy;  Laterality: N/A;  . US echocardiography  08/14/2008    technically difficult-mild  LVH, EF =>55%,mild annular ca+,AOV mildly sclerotic  . Nm myoview ltd  08/02/2012    no ischemia    REVIEW OF SYSTEMS: A 10 point review of systems was conducted and is otherwise negative except for what is noted above.     Health Maintenance Mammogram: 12/07/13 normal Colonoscopy: 12/20/12, f/u in 5 years recommended Bone Density Scan:  03/08/12 Pap Smear: 07/2013 Eye Exam: scheduled 04/27/13 Vitamin D Level: 04/24/13 Lipid Panel: 09/2012  PHYSICAL EXAM:  BP 162/77  Pulse 89  Temp(Src) 98.1 F (36.7 C) (Oral)  Resp 20  Ht _0  (1.651 m)  Wt 234 lb 14.4 oz (106.55 kg)  BMI 39.09 kg/m2 General: Patient is a well appearing female in no acute distress HEENT: PERRLA, sclerae anicteric no conjunctival pallor, MMM Neck: supple, no palpable adenopathy Lungs: clear to auscultation bilaterally, no wheezes, rhonchi, or rales Cardiovascular: regular rate rhythm, S1, S2, no murmurs, rubs or gallops Abdomen: Soft, non-tender, non-distended, normoactive bowel sounds, no HSM Extremities: warm and well perfused, no clubbing, cyanosis, or edema Skin: No rashes or lesions Neuro: Non-focal Breast examination left breast no masses nipple discharge right breast no masses nipple discharge no skin changes . She has fibroglandular tissue in breast but no discrete or dominant or hard nodule. ECOG PERFORMANCE STATUS: 0 - Asymptomatic   LABORATORY DATA:     Chem from today pending.    Chemistry       Component Value Date/Time   NA 140 01/18/2014 1328   NA 141 07/21/2012 1034   K 3.6 01/18/2014 1328   K 3.9 07/21/2012 1034   CL 101 04/24/2013 0904   CL 101 07/21/2012 1034   CO2 27 01/18/2014 1328   CO2 34* 07/21/2012 1034   BUN 17.0 01/18/2014 1328   BUN 17 07/21/2012 1034   CREATININE 1.1 01/18/2014 1328   CREATININE 0.75 07/21/2012 1034      Component Value Date/Time   CALCIUM 10.0 01/18/2014 1328   CALCIUM 10.4 07/21/2012 1034   ALKPHOS 87 01/18/2014 1328   ALKPHOS 81 07/21/2012 1034   AST 22 01/18/2014 1328   AST 15 07/21/2012 1034   ALT 25 01/18/2014 1328   ALT 15 07/21/2012 1034   BILITOT 0.36 01/18/2014 1328   BILITOT 0.3 07/21/2012 1034       ASSESSMENT/PLAN: 73 year old female with  #1 clinical stage III invasive ductal carcinoma of the right breast with skin involvement. Originally presenting in 2012. The tumor was ER positive PR positive HER-2/neu negative. She went on to receive neoadjuvant chemotherapy consisting of 5 cycles of Taxotere Cytoxan completed 02/24/2012. Thereafter she had a right breast lumpectomy with sentinel lymph node biopsy. With the final pathology revealing a 1.9 cm residual disease 2 sentinel nodes were negative for metastatic disease. Her final staging was T4 N0. Again the tumor was ER positive PR positive HER-2/neu negative. Patient received adjuvant radiation therapy to the right breast completed 07/11/2012. Thereafter she was begun on antiestrogen therapy with letrozole 2.5 mg daily starting in August 2013. Overall she has been tolerating the letrozole very nicely without any significant problems.  #2 peripheral neuropathy in the feet: Likely secondary to her chemotherapy that was given in 2013. She does state that it is improving. But she occasionally does get it.  #3 asthma: She does have allergies as well. She will continue to take her prescribed medications.  #4 patient had a mammogram performed IN Dec 2014. postop changes no evidence of recurrent disease. We  will order next one in  Dec 2015.  #5 arthritis: Patient does have rheumatoid arthritis. She tells me she is now on methotrexate she's tolerating it well. Her blood work looks good.  #6 patient will be seen back in 6 months time. She will continue letrozole as prescribed and call with any problems.  #7 patient will take a calcium daily in addition to the Vitamin D she takes. She will also be due for a bone density in 2016 through her primary. I also recommend checking lipid panel at least twice a year as femara can increase the TC/LDL.   #8 Take Vit C 500 daily to help her joints, muscles, tendons and ligaments.   #9 It is still possible that with multiple cancers in her family, there may be a p53  Mutation or Maylon Peppers syndrome and other cohorts who have cancer need to be tested for it. Patient's BRCA testing was negative.    All questions were answered. The patient knows to call the clinic with any problems, questions or concerns. We can certainly see the patient much sooner if necessary.  I spent 30 minutes counseling the patient and coordinating care with a 40 minute appointment.     Bernadene Bell, MD Medical Hematologist/Oncologist [place] Pager: [phone] Office No: [phone]  07/30/2014, 12:03 PM

## 2014-08-02 ENCOUNTER — Telehealth: Payer: Self-pay | Admitting: Hematology and Oncology

## 2014-08-02 NOTE — Telephone Encounter (Signed)
, °

## 2014-09-24 ENCOUNTER — Other Ambulatory Visit: Payer: Medicare Other

## 2014-09-24 ENCOUNTER — Ambulatory Visit: Payer: Medicare Other | Admitting: Hematology and Oncology

## 2014-10-22 ENCOUNTER — Encounter: Payer: Self-pay | Admitting: Hematology

## 2014-11-05 ENCOUNTER — Telehealth: Payer: Self-pay | Admitting: Internal Medicine

## 2014-11-05 NOTE — Telephone Encounter (Signed)
Spoke with patient and told her Dr. Olevia Perches did dilate her esophagus.

## 2014-11-19 ENCOUNTER — Ambulatory Visit: Payer: Medicare Other | Admitting: Cardiovascular Disease

## 2014-12-03 ENCOUNTER — Other Ambulatory Visit: Payer: Self-pay | Admitting: Nurse Practitioner

## 2014-12-10 ENCOUNTER — Ambulatory Visit
Admission: RE | Admit: 2014-12-10 | Discharge: 2014-12-10 | Disposition: A | Discharge status: Home / Self Care | Payer: Medicare Other | Source: Ambulatory Visit | Attending: Hematology | Admitting: Hematology

## 2014-12-10 DIAGNOSIS — C50919 Malignant neoplasm of unspecified site of unspecified female breast: Secondary | ICD-10-CM

## 2014-12-17 ENCOUNTER — Telehealth: Payer: Self-pay | Admitting: Cardiovascular Disease

## 2014-12-17 ENCOUNTER — Ambulatory Visit (HOSPITAL_BASED_OUTPATIENT_CLINIC_OR_DEPARTMENT_OTHER): Payer: Medicare Other | Admitting: Hematology and Oncology

## 2014-12-17 ENCOUNTER — Telehealth: Payer: Self-pay | Admitting: Hematology and Oncology

## 2014-12-17 ENCOUNTER — Other Ambulatory Visit (HOSPITAL_BASED_OUTPATIENT_CLINIC_OR_DEPARTMENT_OTHER): Payer: Medicare Other

## 2014-12-17 VITALS — BP 139/67 | HR 66 | Temp 98.6°F | Resp 20 | Ht 65.0 in | Wt 230.8 lb

## 2014-12-17 DIAGNOSIS — C50411 Malignant neoplasm of upper-outer quadrant of right female breast: Secondary | ICD-10-CM

## 2014-12-17 DIAGNOSIS — Z170 Estrogen receptor positive status [ER+]: Secondary | ICD-10-CM

## 2014-12-17 DIAGNOSIS — C50911 Malignant neoplasm of unspecified site of right female breast: Secondary | ICD-10-CM

## 2014-12-17 DIAGNOSIS — C50919 Malignant neoplasm of unspecified site of unspecified female breast: Secondary | ICD-10-CM

## 2014-12-17 LAB — CBC WITH DIFFERENTIAL/PLATELET
BASO%: 1.1 % (ref 0.0–2.0)
BASOS ABS: 0 10*3/uL (ref 0.0–0.1)
EOS ABS: 0.1 10*3/uL (ref 0.0–0.5)
EOS%: 2.9 % (ref 0.0–7.0)
HCT: 37.3 % (ref 34.8–46.6)
HEMOGLOBIN: 12.2 g/dL (ref 11.6–15.9)
LYMPH%: 30.4 % (ref 14.0–49.7)
MCH: 30.5 pg (ref 25.1–34.0)
MCHC: 32.7 g/dL (ref 31.5–36.0)
MCV: 93.1 fL (ref 79.5–101.0)
MONO#: 0.3 10*3/uL (ref 0.1–0.9)
MONO%: 7.4 % (ref 0.0–14.0)
NEUT%: 58.2 % (ref 38.4–76.8)
NEUTROS ABS: 2.5 10*3/uL (ref 1.5–6.5)
PLATELETS: 290 10*3/uL (ref 145–400)
RBC: 4.01 10*6/uL (ref 3.70–5.45)
RDW: 15.6 % — AB (ref 11.2–14.5)
WBC: 4.2 10*3/uL (ref 3.9–10.3)
lymph#: 1.3 10*3/uL (ref 0.9–3.3)

## 2014-12-17 LAB — COMPREHENSIVE METABOLIC PANEL (CC13)
ALT: 20 U/L (ref 0–55)
AST: 16 U/L (ref 5–34)
Albumin: 3.9 g/dL (ref 3.5–5.0)
Alkaline Phosphatase: 77 U/L (ref 40–150)
Anion Gap: 9 mEq/L (ref 3–11)
BILIRUBIN TOTAL: 0.37 mg/dL (ref 0.20–1.20)
BUN: 18.3 mg/dL (ref 7.0–26.0)
CALCIUM: 10.2 mg/dL (ref 8.4–10.4)
CHLORIDE: 101 meq/L (ref 98–109)
CO2: 31 meq/L — AB (ref 22–29)
CREATININE: 0.9 mg/dL (ref 0.6–1.1)
EGFR: 72 mL/min/{1.73_m2} — AB (ref >90)
GLUCOSE: 108 mg/dL (ref 70–140)
Potassium: 3.5 mEq/L (ref 3.5–5.1)
Sodium: 142 mEq/L (ref 136–145)
Total Protein: 7.2 g/dL (ref 6.4–8.3)

## 2014-12-17 NOTE — Progress Notes (Signed)
Patient Care Team: [name], MD as PCP - General (Internal Medicine)  DIAGNOSIS: Breast cancer, right breast   Staging form: Breast, AJCC 7th Edition     Clinical: Stage IIB (T3, N0, cM0) - Signed by Virgina Organ, FNP on 12/23/2011     Pathologic: No stage assigned - Unsigned  CHIEF COMPLIANT:  follow-up of breast cancer on hormonal therapy INTERVAL HISTORY: [name] is a 73 year old lady with above-mentioned history of right-sided breast cancer treated with neoadjuvant chemotherapy followed by lumpectomy and axillary lymph node dissection followed by radiation and is currently on antiestrogen therapy with Femara. She is tolerating it fairly well without any major problems or concerns. She'll recent mammograms show normal.   REVIEW OF SYSTEMS:   Constitutional: Denies fevers, chills or abnormal weight loss Eyes: Denies blurriness of vision Ears, nose, mouth, throat, and face: Denies mucositis or sore throat Respiratory: Denies cough, dyspnea or wheezes Cardiovascular: Denies palpitation, chest discomfort or lower extremity swelling Gastrointestinal:  Denies nausea, heartburn or change in bowel habits Skin: Denies abnormal skin rashes Lymphatics: Denies new lymphadenopathy or easy bruising Neurological:Denies numbness, tingling or new weaknesses Behavioral/Psych: Mood is stable, no new changes  Breast:  denies any pain or lumps or nodules in either breasts All other systems were reviewed with the patient and are negative.  I have reviewed the past medical history, past surgical history, social history and family history with the patient and they are unchanged from previous note.  ALLERGIES:  is allergic to crestor; lactose intolerance (gi); lipitor; and zocor.  MEDICATIONS:  Current Outpatient Prescriptions  Medication Sig Dispense Refill  . albuterol (PROVENTIL HFA;VENTOLIN HFA) 108 (90 BASE) MCG/ACT inhaler Inhale 2 puffs into the lungs every 4 (four) hours as needed. For  shortnes of breath    . aspirin 81 MG tablet Take 81 mg by mouth daily.     . Cholecalciferol (VITAMIN D) 2000 UNITS tablet Take 2,000 Units by mouth daily.    Marland Kitchen diltiazem (TIAZAC) 240 MG 24 hr capsule Take 240 mg by mouth every morning.     . Fluticasone-Salmeterol (ADVAIR) 100-50 MCG/DOSE AEPB Inhale 1 puff into the lungs every 12 (twelve) hours.     . folic acid (FOLVITE) 1 MG tablet Take 1 mg by mouth daily.    Marland Kitchen letrozole (FEMARA) 2.5 MG tablet Take 1 tablet (2.5 mg total) by mouth daily. 90 tablet 12  . meloxicam (MOBIC) 7.5 MG tablet Take 5 mg by mouth as needed. For knee tightness    . methotrexate 25 MG/ML injection   2  . ondansetron (ZOFRAN) 4 MG tablet Take 4 mg by mouth every 6 (six) hours as needed. For nausea    . ranitidine (ZANTAC) 150 MG tablet Take 150 mg by mouth at bedtime.     . rosuvastatin (CRESTOR) 5 MG tablet Take once a week. Samples given. 14 tablet 0  . triamterene-hydrochlorothiazide (MAXZIDE-25) 37.5-25 MG per tablet Take 1 tablet by mouth every morning. 1/2 tab in the AM     No current facility-administered medications for this visit.    PHYSICAL EXAMINATION: ECOG PERFORMANCE STATUS: 0 - Asymptomatic  Filed Vitals:   12/17/14 1112  BP: 139/67  Pulse: 66  Temp: 98.6 F (37 C)  Resp: 20   Filed Weights   12/17/14 1112  Weight: 230 lb 12.8 oz (104.69 kg)    GENERAL:alert, no distress and comfortable SKIN: skin color, texture, turgor are normal, no rashes or significant lesions EYES: normal, Conjunctiva are pink  and non-injected, sclera clear OROPHARYNX:no exudate, no erythema and lips, buccal mucosa, and tongue normal  NECK: supple, thyroid normal size, non-tender, without nodularity LYMPH:  no palpable lymphadenopathy in the cervical, axillary or inguinal LUNGS: clear to auscultation and percussion with normal breathing effort HEART: regular rate & rhythm and no murmurs and no lower extremity edema ABDOMEN:abdomen soft, non-tender and normal  bowel sounds Musculoskeletal:no cyanosis of digits and no clubbing  NEURO: alert & oriented x 3 with fluent speech, no focal motor/sensory deficits BREAST: No palpable masses or nodules in either right or left breasts. No palpable axillary supraclavicular or infraclavicular adenopathy no breast tenderness or nipple discharge.   LABORATORY DATA:  I have reviewed the data as listed   Chemistry      Component Value Date/Time   NA 142 12/17/2014 1039   NA 139 07/30/2014 1032   K 3.5 12/17/2014 1039   K 3.4* 07/30/2014 1032   CL 100 07/30/2014 1032   CL 101 04/24/2013 0904   CO2 31* 12/17/2014 1039   CO2 29 07/30/2014 1032   BUN 18.3 12/17/2014 1039   BUN 18 07/30/2014 1032   CREATININE 0.9 12/17/2014 1039   CREATININE 0.84 07/30/2014 1032      Component Value Date/Time   CALCIUM 10.2 12/17/2014 1039   CALCIUM 9.7 07/30/2014 1032   ALKPHOS 77 12/17/2014 1039   ALKPHOS 74 07/30/2014 1032   AST 16 12/17/2014 1039   AST 14 07/30/2014 1032   ALT 20 12/17/2014 1039   ALT 16 07/30/2014 1032   BILITOT 0.37 12/17/2014 1039   BILITOT 0.3 07/30/2014 1032       Lab Results  Component Value Date   WBC 4.2 12/17/2014   HGB 12.2 12/17/2014   HCT 37.3 12/17/2014   MCV 93.1 12/17/2014   PLT 290 12/17/2014   NEUTROABS 2.5 12/17/2014     RADIOGRAPHIC STUDIES: I have personally reviewed the radiology reports and agreed with their findings. Mammogram December 2015 is normal  ASSESSMENT & PLAN:  Breast cancer, right breast Right breast invasive ductal carcinoma with DCIS 2.5 cm with the MRI showing a maximum diameter of 7.5 cm ER/PR positive HER-2 negative Ki-67 15% treated with neoadjuvant chemotherapy with Taxotere and Cytoxan 12/02/2011 4 followed by surgery with lumpectomy which showed IDC with lymphovascular invasion 1.9 cm, 2 sentinel lymph nodes negative, followed by radiation therapy and now on antiestrogen therapy with letrozole 2.5 mg daily from 07/21/2012  Aromatase  inhibitor related toxicities: Occasional hot flashes  Breast cancer surveillance: 1. Breast exam 12/17/2014 normal 2. Mammogram December 2015 normal  Return to clinic in 6 months for follow-up on antiestrogen therapy for 3 years then annually    No orders of the defined types were placed in this encounter.   The patient has a good understanding of the overall plan. she agrees with it. She will call with any problems that may develop before her next visit here.   Rulon Eisenmenger, MD 12/17/2014 11:46 AM

## 2014-12-17 NOTE — Telephone Encounter (Signed)
Closed encounter °

## 2014-12-17 NOTE — Assessment & Plan Note (Signed)
Right breast invasive ductal carcinoma with DCIS 2.5 cm with the MRI showing a maximum diameter of 7.5 cm ER/PR positive HER-2 negative Ki-67 15% treated with neoadjuvant chemotherapy with Taxotere and Cytoxan 12/02/2011 4 followed by surgery with lumpectomy which showed IDC with lymphovascular invasion 1.9 cm, 2 sentinel lymph nodes negative, followed by radiation therapy and now on antiestrogen therapy with letrozole 2.5 mg daily from 07/21/2012  Aromatase inhibitor related toxicities: Occasional hot flashes  Breast cancer surveillance: 1. Breast exam 12/17/2014 normal 2. Mammogram December 2015 normal  Return to clinic in 6 months for follow-up on antiestrogen therapy for 3 years then annually

## 2014-12-17 NOTE — Telephone Encounter (Signed)
per of to sch pt appt-gave pt copy of sch

## 2014-12-28 ENCOUNTER — Ambulatory Visit: Payer: Medicare Other | Admitting: Cardiovascular Disease

## 2015-01-04 ENCOUNTER — Ambulatory Visit: Payer: Medicare Other | Admitting: Cardiovascular Disease

## 2015-02-11 ENCOUNTER — Encounter: Payer: Self-pay | Admitting: Cardiovascular Disease

## 2015-02-11 ENCOUNTER — Ambulatory Visit (INDEPENDENT_AMBULATORY_CARE_PROVIDER_SITE_OTHER): Payer: Medicare Other | Admitting: Cardiovascular Disease

## 2015-02-11 VITALS — BP 130/76 | HR 95 | Resp 16 | Ht 64.5 in | Wt 222.7 lb

## 2015-02-11 DIAGNOSIS — Z79899 Other long term (current) drug therapy: Secondary | ICD-10-CM

## 2015-02-11 DIAGNOSIS — R079 Chest pain, unspecified: Secondary | ICD-10-CM

## 2015-02-11 DIAGNOSIS — E785 Hyperlipidemia, unspecified: Secondary | ICD-10-CM

## 2015-02-11 DIAGNOSIS — I25119 Atherosclerotic heart disease of native coronary artery with unspecified angina pectoris: Secondary | ICD-10-CM

## 2015-02-11 MED ORDER — POTASSIUM CHLORIDE CRYS ER 20 MEQ PO TBCR: 20.0000 meq | EXTENDED_RELEASE_TABLET | Freq: Every day | ORAL | Status: DC

## 2015-02-11 NOTE — Patient Instructions (Signed)
START Potassium 39meq daily.  Your physician recommends that you return for lab work in: One week 02/18/2015.   Your physician has requested that you have a lexiscan myoview. For further information please visit [email or web address]. Please follow instruction sheet, as given.  Dr. Sallyanne Kuster recommends that you schedule a follow-up appointment in: One year.

## 2015-02-11 NOTE — Progress Notes (Signed)
Patient ID: [name], female   DOB: Apr 26, 1941, 74 y.o.   MRN: [id number]      Reason for office visit Chest discomfort, CAD  [name] has experienced some problems with chest discomfort that she describes as "indigestion". About 2-3 weeks ago she had "terrible indigestion" despite the fact that she took both Dexilant and ranitidine. She did not try to take antacids. There was some associated dyspnea. Belching maybe helped briefly but then the symptoms would return.  [name] is now 74 years old and has a known intermediate severity stenosis of the mid LAD artery. This was demonstrated by coronary angiography in 2011, but shown not to be hemodynamically important by pressure wire analysis. She had some problems with intrascapular pressure in 2013 and had a nuclear stress test that showed normal myocardial perfusion. Has normal left ventricular systolic function.Her symptoms resolved at that time with treatment for gastroesophageal reflux disease.  It has been very challenging to find a way to reduce her elevated LDL cholesterol. She has tried numerous statins in the past and has been intolerant. He is currently taking Crestor 5 mg weekly. Significant noncardiac pathology includes cancer of the right breast treated with neoadjuvant chemotherapy, lumpectomy with axillary lymph node dissection and radiation therapy, currently on Femara. Seen Dr. Julio Alm in the past for esophageal reflux   Allergies  Allergen Reactions  . Crestor [Rosuvastatin] Other (See Comments)    crampin of legs and feet  . Lactose Intolerance (Gi) Other (See Comments)    ABD. CRAMPS  . Lipitor [Atorvastatin Calcium] Other (See Comments)    LEG CRAMPS  . Zocor [Simvastatin - High Dose] Other (See Comments)    Leg cramps    Current Outpatient Prescriptions  Medication Sig Dispense Refill  . albuterol (PROVENTIL HFA;VENTOLIN HFA) 108 (90 BASE) MCG/ACT inhaler Inhale 2 puffs into the lungs every 4 (four) hours as needed.  For shortnes of breath    . aspirin 81 MG tablet Take 81 mg by mouth daily.     . Cholecalciferol (VITAMIN D) 2000 UNITS tablet Take 2,000 Units by mouth daily.    Marland Kitchen diltiazem (TIAZAC) 240 MG 24 hr capsule Take 240 mg by mouth every morning.     . Fluticasone-Salmeterol (ADVAIR) 100-50 MCG/DOSE AEPB Inhale 1 puff into the lungs every 12 (twelve) hours.     . folic acid (FOLVITE) 1 MG tablet Take 1 mg by mouth daily.    Marland Kitchen letrozole (FEMARA) 2.5 MG tablet Take 1 tablet (2.5 mg total) by mouth daily. 90 tablet 12  . meloxicam (MOBIC) 7.5 MG tablet Take 5 mg by mouth as needed. For knee tightness    . methotrexate 25 MG/ML injection   2  . ondansetron (ZOFRAN) 4 MG tablet Take 4 mg by mouth every 6 (six) hours as needed. For nausea    . ranitidine (ZANTAC) 150 MG tablet Take 150 mg by mouth at bedtime.     . rosuvastatin (CRESTOR) 5 MG tablet Take once a week. Samples given. 14 tablet 0  . triamterene-hydrochlorothiazide (MAXZIDE-25) 37.5-25 MG per tablet Take 1 tablet by mouth every morning. 1/2 tab in the AM    . potassium chloride (K-DUR,KLOR-CON) 20 MEQ tablet Take 1 tablet (20 mEq total) by mouth daily. 30 tablet 6   No current facility-administered medications for this visit.    Past Medical History  Diagnosis Date  . GERD (gastroesophageal reflux disease)   . Asthma     daily and prn inhalers  .  Grave's disease     no current meds.  . Arthritis     osteo - s/p right total knee  . Spondylosis     cervical and lumbar  . Hyperlipidemia   . Breast cancer 10/28/11    right breast  . Hypertension     under control with med.  . S/P chemotherapy, time since 4-12 weeks finished 02/20/2011  . Diabetes mellitus     diet-controlled  . History of chemotherapy     neoadjuvant: 5 cycles of Taxol/carboplatinum:  last dose 02/24/12  . Boil, axilla   . S/P radiation therapy 05/03/12 - 07/11/12    Right Breast High Axilla and Supraclavicular gegion: 4500 cGy/25 Fractions with boost for a total of  6300 cGy  . Use of letrozole (Femara) start 07/21/12  . Sleep apnea sleep study 09/18/2005    uses CPAP occ. - to bring machine DOS  . CAD (coronary artery disease)   . Hypothyroid     Past Surgical History  Procedure Laterality Date  . Lumbar laminectomy      back surg. x 2 - 1980's and 1999  . Total knee arthroplasty  07/28/2006    right  . Cataract extraction, bilateral    . Bilateral oophorectomy  2009  . Partial hysterectomy      BSO  . Cardiac catheterization  04/10/2010, 05/07/2004    LAD lesion, blood flow OK, per pt.  . Portacath placement  11/27/2011    Procedure: INSERTION PORT-A-CATH;  Surgeon: Ernestene Mention, MD;  Location: [facility];  Service: General;  Laterality: Right;  . Lesion removal  11/27/2011    Procedure: LESION REMOVAL;  Surgeon: Ernestene Mention, MD;  Location: [facility];  Service: General;  Laterality: Right;  Skin tag removed from under right eye. No specimen  . Abdominal hysterectomy    . Carpal tunnel release      left  . Mastectomy partial / lumpectomy  04/01/12    right breast, inv ductal ca, ER/PR  +, HER 2 -   . Breast surgery    . Joint replacement  2006    RTK  . Esophagogastroduodenoscopy  12/20/2012    Procedure: ESOPHAGOGASTRODUODENOSCOPY (EGD);  Surgeon: Hart Carwin, MD;  Location: [facility];  Service: Endoscopy;  Laterality: N/A;  . Colonoscopy  12/20/2012    Procedure: COLONOSCOPY;  Surgeon: Hart Carwin, MD;  Location: [facility];  Service: Endoscopy;  Laterality: N/A;  . US echocardiography  08/14/2008    technically difficult-mild LVH, EF =>55%,mild annular ca+,AOV mildly sclerotic  . Nm myoview ltd  08/02/2012    no ischemia    Family History  Problem Relation Age of Onset  . Cancer Maternal Grandfather     unaware  . Anesthesia problems Cousin     pt. states she is not aware of any family anes. prob., does not know where this comment came from  . Cancer Cousin     breast, brain mets  .  Cancer Cousin     ovarian, age 70-50  . Cancer Cousin     pancreatic    History   Social History  . Marital Status: Single    Spouse Name: N/A  . Number of Children: N/A  . Years of Education: N/A   Occupational History  . retired    Social History Main Topics  . Smoking status: Never Smoker   . Smokeless tobacco: Never Used  . Alcohol Use: No  . Drug  Use: No  . Sexual Activity: Not Currently   Other Topics Concern  . 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 No other focal neurological complaints. Mild left ankle swelling which she attributes to orthopedic problems The patient specifically denies any chest pain with exertion, dyspnea at rest or with exertion, orthopnea, paroxysmal nocturnal dyspnea, syncope, palpitations, intermittent claudication, lower extremity edema, unexplained weight gain, cough, hemoptysis or wheezing.  The patient also denies abdominal pain, nausea, vomiting, dysphagia, diarrhea, constipation, polyuria, polydipsia, dysuria, hematuria, frequency, urgency, abnormal bleeding or bruising, fever, chills, unexpected weight changes, mood swings, change in skin or hair texture, change in voice quality, auditory or visual problems, allergic reactions or rashes, new musculoskeletal complaints other than usual "aches and pains".  PHYSICAL EXAM BP 130/76 mmHg  Pulse 95  Resp 16  Ht 5' 4.5" (1.638 m)  Wt 222 lb 11.2 oz (101.016 kg)  BMI 37.65 kg/m2  General: Alert, oriented x3, no distress, moderate obesity Head: no evidence of trauma, PERRL, EOMI, no exophtalmos or lid lag, no myxedema, no xanthelasma; normal ears, nose and oropharynx Neck: normal jugular venous pulsations and no hepatojugular reflux; brisk carotid pulses without delay and no  carotid bruits Chest: clear to auscultation, no signs of consolidation by percussion or palpation, normal fremitus, symmetrical and full respiratory excursions Cardiovascular: normal position and quality of the apical impulse, regular rhythmrare ectopic beats, normal first and second heart sounds, no murmurs, rubs or gallops Abdomen: no tenderness or distention, no masses by palpation, no abnormal pulsatility or arterial bruits, normal bowel sounds, no hepatosplenomegaly Extremities: no clubbing, cyanosis or edema; 2+ radial, ulnar and brachial pulses bilaterally; 2+ right femoral, posterior tibial and dorsalis pedis pulses; 2+ left femoral, posterior tibial and dorsalis pedis pulses; no subclavian or femoral bruits Neurological: grossly nonfocal   EKG: normal sinus rhythm with premature atrial contractions in a couple of very short runs of nonsustained paroxysmal atrial tachycardia. There is T-wave inversion seen in leads 1 and aVL similar to previous tracings  Lipid Panel  No results found for: CHOL, TRIG, HDL, CHOLHDL, VLDL, LDLCALC, LDLDIRECT  BMET    Component Value Date/Time   NA 142 12/17/2014 1039   NA 139 07/30/2014 1032   K 3.5 12/17/2014 1039   K 3.4* 07/30/2014 1032   CL 100 07/30/2014 1032   CL 101 04/24/2013 0904   CO2 31* 12/17/2014 1039   CO2 29 07/30/2014 1032   GLUCOSE 108 12/17/2014 1039   GLUCOSE 162* 07/30/2014 1032   GLUCOSE 116* 04/24/2013 0904   BUN 18.3 12/17/2014 1039   BUN 18 07/30/2014 1032   CREATININE 0.9 12/17/2014 1039   CREATININE 0.84 07/30/2014 1032   CALCIUM 10.2 12/17/2014 1039   CALCIUM 9.7 07/30/2014 1032   GFRNONAA 71* 03/31/2012 0910   GFRAA 83* 03/31/2012 0910     ASSESSMENT AND PLAN "indigestion", possible angina pectoris Symptoms are atypical but this could represent angina pectoris. Also has a history of gastroesophageal reflux disease, but the symptoms were different in the past.Recommend vasodilator myocardial perfusion study. She  has known coronary artery disease with intermediate severity stenoses in the LAD artery. Her ECG is abnormal but does not show high risk findings.  Hyperlipidemia on very low dose statin therapy Time to recheck her lipids.  Weight loss recommended again  Hypertension Well-controlled on diuretic and calcium channel blocker  Hypokalemia Potassium supplements briefly when she was determined to have hypokalemia a couple of months ago. I think she needs to resume potassium chloride 20 mEq daily  Paroxysmal atrial tachycardia Could be secondary to hypokalemia and use of bronchodilators. It appears to be asymptomatic. Can increase diltiazem if necessary.  Orders Placed This Encounter  Procedures  . Comprehensive metabolic panel  . Lipid panel  . Myocardial Perfusion Imaging  . EKG 12-Lead   Meds ordered this encounter  Medications  . potassium chloride (K-DUR,KLOR-CON) 20 MEQ tablet    Sig: Take 1 tablet (20 mEq total) by mouth daily.    Dispense:  30 tablet    Refill:  6    Yeriel Mineo  Thurmon Fair, MD, [facility] [phone] office [phone] pager

## 2015-02-22 ENCOUNTER — Telehealth (HOSPITAL_COMMUNITY): Payer: Self-pay

## 2015-02-22 LAB — COMPREHENSIVE METABOLIC PANEL
ALT: 19 U/L (ref 0–35)
AST: 18 U/L (ref 0–37)
Albumin: 4.2 g/dL (ref 3.5–5.2)
Alkaline Phosphatase: 71 U/L (ref 39–117)
BILIRUBIN TOTAL: 0.5 mg/dL (ref 0.2–1.2)
BUN: 15 mg/dL (ref 6–23)
CO2: 31 mEq/L (ref 19–32)
CREATININE: 0.98 mg/dL (ref 0.50–1.10)
Calcium: 10.2 mg/dL (ref 8.4–10.5)
Chloride: 101 mEq/L (ref 96–112)
Glucose, Bld: 114 mg/dL — ABNORMAL HIGH (ref 70–99)
Potassium: 3.8 mEq/L (ref 3.5–5.3)
Sodium: 141 mEq/L (ref 135–145)
Total Protein: 7.1 g/dL (ref 6.0–8.3)

## 2015-02-22 LAB — LIPID PANEL
CHOL/HDL RATIO: 3.5 ratio
Cholesterol: 223 mg/dL — ABNORMAL HIGH (ref 0–200)
HDL: 64 mg/dL (ref >=46)
LDL Cholesterol: 141 mg/dL — ABNORMAL HIGH (ref 0–99)
Triglycerides: 92 mg/dL (ref <150)
VLDL: 18 mg/dL (ref 0–40)

## 2015-02-22 NOTE — Telephone Encounter (Signed)
Encounter complete. 

## 2015-02-25 ENCOUNTER — Telehealth: Payer: Self-pay | Admitting: *Deleted

## 2015-02-25 DIAGNOSIS — E785 Hyperlipidemia, unspecified: Secondary | ICD-10-CM

## 2015-02-25 DIAGNOSIS — Z79899 Other long term (current) drug therapy: Secondary | ICD-10-CM

## 2015-02-25 MED ORDER — ROSUVASTATIN CALCIUM 5 MG PO TABS: 5.0000 mg | ORAL_TABLET | ORAL | Status: DC

## 2015-02-25 NOTE — Telephone Encounter (Signed)
-----   Message from Sanda Klein, MD sent at 02/24/2015 10:57 AM EST ----- LDL just a little too high. Can she please try Crestor twice a week and recheck in 3 months?

## 2015-02-25 NOTE — Telephone Encounter (Signed)
Lab results called to patient.  Informed LDL is still too high and Dr. Loletha Grayer wants her to increase crestor to twice a week and have labs rechecked in 3 months.  Voiced understanding.  Lab order placed and mailed to patient.

## 2015-02-27 ENCOUNTER — Ambulatory Visit (HOSPITAL_COMMUNITY)
Admission: RE | Admit: 2015-02-27 | Discharge: 2015-02-27 | Disposition: A | Discharge status: Home / Self Care | Payer: Medicare Other | Source: Ambulatory Visit | Attending: Internal Medicine | Admitting: Internal Medicine

## 2015-02-27 DIAGNOSIS — R079 Chest pain, unspecified: Secondary | ICD-10-CM

## 2015-02-27 MED ORDER — TECHNETIUM TC 99M SESTAMIBI GENERIC - CARDIOLITE
10.4000 | Freq: Once | INTRAVENOUS | Status: AC | PRN
  Administered 2015-02-27: 10 via INTRAVENOUS

## 2015-02-27 MED ORDER — TECHNETIUM TC 99M SESTAMIBI GENERIC - CARDIOLITE
31.9000 | Freq: Once | INTRAVENOUS | Status: AC | PRN
  Administered 2015-02-27: 31.9 via INTRAVENOUS

## 2015-02-27 MED ORDER — REGADENOSON 0.4 MG/5ML IV SOLN
0.4000 mg | Freq: Once | INTRAVENOUS | Status: AC
  Administered 2015-02-27: 0.4 mg via INTRAVENOUS

## 2015-02-27 NOTE — Procedures (Addendum)
[facility] [address] [place] [address] [phone]  Cardiology Nuclear Med Study  [name] is a 74 y.o. female     MRN : [id number]     DOB: 07/09/1941  Procedure Date: 02/27/2015  Nuclear Med Background Indication for Stress Test:  Evaluation for Ischemia and Follow up CAD History:  Asthma and CAD;LAD stenosis;Last NUC MPI on 08/02/2012-normal;EF=68%;DVT Cardiac Risk Factors: Family History - CAD, Hypertension, Lipids, Overweight and DVT;Graves Disease;  Symptoms:  Chest Pain, Dizziness, DOE, Fatigue, Light-Headedness, Palpitations and SOB   Nuclear Pre-Procedure Caffeine/Decaff Intake:  7:00pm NPO After: 3:00am   IV Site: R Hand  IV 0.9% NS with Angio Cath:  22g  Chest Size (in):  n/a IV Started by: Rolene Course, RN  Height: 5\' 5"  (1.651 m)  Cup Size: DD;Pt is s/p right lumpectomy;No lymphectomy;Pt states is ok for IV and BP in right arm.  BMI:  Body mass index is 36.94 kg/(m^2). Weight:  222 lb (100.699 kg)   Tech Comments:  n/a    Nuclear Med Study 1 or 2 day study: 1 day  Stress Test Type:  [place] Provider:  Sanda Klein, MD   Resting Radionuclide: Technetium 77m Sestamibi  Resting Radionuclide Dose: 10.4 mCi   Stress Radionuclide:  Technetium 48m Sestamibi  Stress Radionuclide Dose: 31.9 mCi           Stress Protocol Rest HR: 85 Stress HR: 93  Rest BP: 121/86 Stress BP: 130/94  Exercise Time (min): n/a METS: n/a          Dose of Adenosine (mg):  n/a Dose of Lexiscan: 0.4 mg  Dose of Atropine (mg): n/a Dose of Dobutamine: n/a mcg/kg/min (at max HR)  Stress Test Technologist: Leane Para, CCT Nuclear Technologist: Imagene Riches, CNMT   Rest Procedure:  Myocardial perfusion imaging was performed at rest 45 minutes following the intravenous administration of Technetium 74m Sestamibi. Stress Procedure:  The patient received IV Lexiscan 0.4 mg over 15-seconds.  Technetium  14m Sestamibi injected IV  at 30-seconds.  There were no significant changes with Lexiscan.  Quantitative spect images were obtained after a 45 minute delay.  Transient Ischemic Dilatation (Normal <1.22):  .99  QGS EDV:  93 ml QGS ESV:  41 ml LV Ejection Fraction: 56%  Rest ECG: NSR - Normal EKG  Stress ECG: There are scattered PVCs. and There are scattered PACs.  QPS Raw Data Images:  Normal; no motion artifact; normal heart/lung ratio. Stress Images:  There is decreased uptake in the apex. Rest Images:  There is decreased uptake in the apex. Subtraction (SDS):  No reversibility is appreciated.  Impression Exercise Capacity:  Lexiscan with no exercise. BP Response:  Normal blood pressure response. Clinical Symptoms:  No significant symptoms noted. ECG Impression:  There is no significant ectopy. Comparison with Prior Nuclear Study: No significant change from previous study  Overall Impression:  Low risk stress nuclear study with small area of inferoapical attenuation artifact, worse at rest than stress.  LV Wall Motion:  NL LV Function; NL Wall Motion; EF 56%  [name], MD, [facility] Board Certified in Nuclear Cardiology Attending Cardiologist [place], MD  02/27/2015 1:11 PM

## 2015-03-15 ENCOUNTER — Other Ambulatory Visit: Payer: Self-pay | Admitting: Nurse Practitioner

## 2015-04-02 ENCOUNTER — Encounter: Payer: Self-pay | Admitting: Cardiovascular Disease

## 2015-04-09 ENCOUNTER — Other Ambulatory Visit: Payer: Self-pay | Admitting: *Deleted

## 2015-04-09 MED ORDER — LETROZOLE 2.5 MG PO TABS: 2.5000 mg | ORAL_TABLET | Freq: Every day | ORAL | Status: DC

## 2015-04-17 ENCOUNTER — Other Ambulatory Visit: Payer: Self-pay | Admitting: Gynecology

## 2015-04-18 LAB — CYTOLOGY - PAP

## 2015-06-10 ENCOUNTER — Other Ambulatory Visit: Payer: Self-pay | Admitting: Hematology and Oncology

## 2015-06-10 NOTE — Telephone Encounter (Signed)
Last OV 12/28   Next OV 6/28.  Chart reviewed

## 2015-06-17 NOTE — Assessment & Plan Note (Signed)
Right breast invasive ductal carcinoma with DCIS 2.5 cm with the MRI showing a maximum diameter of 7.5 cm ER/PR positive HER-2 negative Ki-67 15% treated with neoadjuvant chemotherapy with Taxotere and Cytoxan 12/02/2011 4 followed by surgery with lumpectomy which showed IDC with lymphovascular invasion 1.9 cm, 2 sentinel lymph nodes negative, followed by radiation therapy and now on antiestrogen therapy with letrozole 2.5 mg daily from 07/21/2012  Aromatase inhibitor related toxicities: Occasional hot flashes  Breast cancer surveillance: 1. Breast exam 12/17/2014 normal 2. Mammogram December 2015 normal  Return to clinic in 12 months

## 2015-06-18 ENCOUNTER — Telehealth: Payer: Self-pay | Admitting: Hematology and Oncology

## 2015-06-18 ENCOUNTER — Ambulatory Visit (HOSPITAL_BASED_OUTPATIENT_CLINIC_OR_DEPARTMENT_OTHER): Payer: Medicare Other | Admitting: Hematology and Oncology

## 2015-06-18 ENCOUNTER — Encounter: Payer: Self-pay | Admitting: Hematology and Oncology

## 2015-06-18 VITALS — BP 138/51 | HR 65 | Temp 98.2°F | Resp 18 | Ht 65.0 in | Wt 221.5 lb

## 2015-06-18 DIAGNOSIS — Z170 Estrogen receptor positive status [ER+]: Secondary | ICD-10-CM | POA: Diagnosis not present

## 2015-06-18 DIAGNOSIS — Z79811 Long term (current) use of aromatase inhibitors: Secondary | ICD-10-CM | POA: Diagnosis not present

## 2015-06-18 DIAGNOSIS — C50411 Malignant neoplasm of upper-outer quadrant of right female breast: Secondary | ICD-10-CM | POA: Diagnosis not present

## 2015-06-18 DIAGNOSIS — C50911 Malignant neoplasm of unspecified site of right female breast: Secondary | ICD-10-CM

## 2015-06-18 NOTE — Telephone Encounter (Signed)
Gave avs & calendar for June 2017 °

## 2015-06-18 NOTE — Progress Notes (Signed)
Patient Care Team: [name], MD as PCP - General (Internal Medicine)  DIAGNOSIS: Breast cancer, right breast  Staging form: Breast, AJCC 7th Edition  Clinical: Stage IIB (T3, N0, cM0) - Signed by Virgina Organ, FNP on 12/23/2011  Pathologic: No stage assigned - Unsigned  CHIEF COMPLIANT: follow-up of breast cancer on letrozole  INTERVAL HISTORY: [name] is a 74 year old above-mentioned history of right breast cancer currently on hormonal therapy with letrozole and tolerating it extremely well without any major problems. She does have hot flashes. She was put on a recent thyroid medication which causes her to sweat profusely. Apart from that she denies any muscle aches or pains. She denies any lumps or nodules in the breasts.  REVIEW OF SYSTEMS:   Constitutional: Denies fevers, chills or abnormal weight loss Eyes: Denies blurriness of vision Ears, nose, mouth, throat, and face: Denies mucositis or sore throat Respiratory: Denies cough, dyspnea or wheezes Cardiovascular: Denies palpitation, chest discomfort or lower extremity swelling Gastrointestinal:  Denies nausea, heartburn or change in bowel habits Skin: Denies abnormal skin rashes Lymphatics: Denies new lymphadenopathy or easy bruising Neurological:Denies numbness, tingling or new weaknesses Behavioral/Psych: Mood is stable, no new changes  Breast:  denies any pain or lumps or nodules in either breasts All other systems were reviewed with the patient and are negative.  I have reviewed the past medical history, past surgical history, social history and family history with the patient and they are unchanged from previous note.  ALLERGIES:  is allergic to crestor; lactose intolerance (gi); lipitor; and zocor.  MEDICATIONS:  Current Outpatient Prescriptions  Medication Sig Dispense Refill  . albuterol (PROVENTIL HFA;VENTOLIN HFA) 108 (90 BASE) MCG/ACT inhaler Inhale 2 puffs into the lungs every 4 (four) hours as needed.  For shortnes of breath    . aspirin 81 MG tablet Take 81 mg by mouth daily.     . Cholecalciferol (VITAMIN D) 2000 UNITS tablet Take 2,000 Units by mouth daily.    Marland Kitchen diltiazem (TIAZAC) 240 MG 24 hr capsule Take 240 mg by mouth every morning.     . Fluticasone-Salmeterol (ADVAIR) 100-50 MCG/DOSE AEPB Inhale 1 puff into the lungs every 12 (twelve) hours.     . folic acid (FOLVITE) 1 MG tablet Take 1 mg by mouth daily.    Marland Kitchen letrozole (FEMARA) 2.5 MG tablet TAKE 1 BY MOUTH DAILY 90 tablet 0  . levothyroxine (SYNTHROID, LEVOTHROID) 50 MCG tablet Take 50 mcg by mouth daily.  2  . meloxicam (MOBIC) 7.5 MG tablet Take 5 mg by mouth as needed. For knee tightness    . methotrexate 25 MG/ML injection   2  . ranitidine (ZANTAC) 150 MG tablet Take 150 mg by mouth at bedtime.     . rosuvastatin (CRESTOR) 5 MG tablet Take 1 tablet (5 mg total) by mouth 2 (two) times a week. 15 tablet 0  . triamterene-hydrochlorothiazide (MAXZIDE-25) 37.5-25 MG per tablet Take 1 tablet by mouth every morning. 1/2 tab in the AM     No current facility-administered medications for this visit.    PHYSICAL EXAMINATION: ECOG PERFORMANCE STATUS: 1 - Symptomatic but completely ambulatory  Filed Vitals:   06/18/15 1059  BP: 138/51  Pulse: 65  Temp: 98.2 F (36.8 C)  Resp: 18   Filed Weights   06/18/15 1059  Weight: 221 lb 8 oz (100.472 kg)    GENERAL:alert, no distress and comfortable SKIN: skin color, texture, turgor are normal, no rashes or significant lesions EYES: normal, Conjunctiva are  pink and non-injected, sclera clear OROPHARYNX:no exudate, no erythema and lips, buccal mucosa, and tongue normal  NECK: supple, thyroid normal size, non-tender, without nodularity LYMPH:  no palpable lymphadenopathy in the cervical, axillary or inguinal LUNGS: clear to auscultation and percussion with normal breathing effort HEART: regular rate & rhythm and no murmurs and no lower extremity edema ABDOMEN:abdomen soft,  non-tender and normal bowel sounds Musculoskeletal:no cyanosis of digits and no clubbing  NEURO: alert & oriented x 3 with fluent speech, no focal motor/sensory deficits BREAST: No palpable masses or nodules in either right or left breasts. No palpable axillary supraclavicular or infraclavicular adenopathy no breast tenderness or nipple discharge. (exam performed in the presence of a chaperone)  LABORATORY DATA:  I have reviewed the data as listed   Chemistry      Component Value Date/Time   NA 141 02/22/2015 0935   NA 142 12/17/2014 1039   K 3.8 02/22/2015 0935   K 3.5 12/17/2014 1039   CL 101 02/22/2015 0935   CL 101 04/24/2013 0904   CO2 31 02/22/2015 0935   CO2 31* 12/17/2014 1039   BUN 15 02/22/2015 0935   BUN 18.3 12/17/2014 1039   CREATININE 0.98 02/22/2015 0935   CREATININE 0.9 12/17/2014 1039   CREATININE 0.84 07/30/2014 1032      Component Value Date/Time   CALCIUM 10.2 02/22/2015 0935   CALCIUM 10.2 12/17/2014 1039   ALKPHOS 71 02/22/2015 0935   ALKPHOS 77 12/17/2014 1039   AST 18 02/22/2015 0935   AST 16 12/17/2014 1039   ALT 19 02/22/2015 0935   ALT 20 12/17/2014 1039   BILITOT 0.5 02/22/2015 0935   BILITOT 0.37 12/17/2014 1039       Lab Results  Component Value Date   WBC 4.2 12/17/2014   HGB 12.2 12/17/2014   HCT 37.3 12/17/2014   MCV 93.1 12/17/2014   PLT 290 12/17/2014   NEUTROABS 2.5 12/17/2014    ASSESSMENT & PLAN:  Breast cancer, right breast Right breast invasive ductal carcinoma with DCIS 2.5 cm with the MRI showing a maximum diameter of 7.5 cm ER/PR positive HER-2 negative Ki-67 15% treated with neoadjuvant chemotherapy with Taxotere and Cytoxan 12/02/2011 4 followed by surgery with lumpectomy which showed IDC with lymphovascular invasion 1.9 cm, 2 sentinel lymph nodes negative, followed by radiation therapy and now on antiestrogen therapy with letrozole 2.5 mg daily from 07/21/2012  Aromatase inhibitor related toxicities: Occasional hot  flashes  Breast cancer surveillance: 1. Breast exam 12/17/2014 normal 2. Mammogram December 2015 normal  Return to clinic in 12 months   No orders of the defined types were placed in this encounter.   The patient has a good understanding of the overall plan. she agrees with it. she will call with any problems that may develop before the next visit here.   Rulon Eisenmenger, MD     Surgeon [facility]

## 2015-11-12 ENCOUNTER — Other Ambulatory Visit (HOSPITAL_COMMUNITY): Payer: Self-pay | Admitting: Internal Medicine

## 2015-11-12 ENCOUNTER — Ambulatory Visit (HOSPITAL_COMMUNITY)
Admission: RE | Admit: 2015-11-12 | Discharge: 2015-11-12 | Disposition: A | Discharge status: Home / Self Care | Payer: Medicare Other | Source: Ambulatory Visit | Attending: Internal Medicine | Admitting: Internal Medicine

## 2015-11-12 DIAGNOSIS — M542 Cervicalgia: Secondary | ICD-10-CM | POA: Diagnosis not present

## 2015-11-12 DIAGNOSIS — M47892 Other spondylosis, cervical region: Secondary | ICD-10-CM | POA: Insufficient documentation

## 2015-11-12 DIAGNOSIS — M4802 Spinal stenosis, cervical region: Secondary | ICD-10-CM | POA: Diagnosis not present

## 2015-11-12 DIAGNOSIS — M50223 Other cervical disc displacement at C6-C7 level: Secondary | ICD-10-CM | POA: Diagnosis not present

## 2015-11-12 DIAGNOSIS — M4602 Spinal enthesopathy, cervical region: Secondary | ICD-10-CM | POA: Insufficient documentation

## 2015-11-12 MED ORDER — GADOBENATE DIMEGLUMINE 529 MG/ML IV SOLN
20.0000 mL | Freq: Once | INTRAVENOUS | Status: AC | PRN
  Administered 2015-11-12: 20 mL via INTRAVENOUS

## 2015-11-18 ENCOUNTER — Other Ambulatory Visit: Payer: Self-pay | Admitting: Internal Medicine

## 2015-11-18 DIAGNOSIS — M542 Cervicalgia: Secondary | ICD-10-CM

## 2015-11-28 ENCOUNTER — Ambulatory Visit
Admission: RE | Admit: 2015-11-28 | Discharge: 2015-11-28 | Disposition: A | Discharge status: Home / Self Care | Payer: Medicare Other | Source: Ambulatory Visit | Attending: Internal Medicine | Admitting: Internal Medicine

## 2015-11-28 DIAGNOSIS — M542 Cervicalgia: Secondary | ICD-10-CM

## 2015-11-28 MED ORDER — IOHEXOL 300 MG/ML  SOLN
1.0000 mL | Freq: Once | INTRAMUSCULAR | Status: AC | PRN
  Administered 2015-11-28: 1 mL via EPIDURAL

## 2015-11-28 MED ORDER — TRIAMCINOLONE ACETONIDE 40 MG/ML IJ SUSP (RADIOLOGY)
60.0000 mg | Freq: Once | INTRAMUSCULAR | Status: AC
  Administered 2015-11-28: 60 mg via EPIDURAL

## 2015-11-28 NOTE — Discharge Instructions (Signed)

## 2016-01-14 ENCOUNTER — Other Ambulatory Visit: Payer: Self-pay | Admitting: General Surgery

## 2016-01-14 DIAGNOSIS — Z853 Personal history of malignant neoplasm of breast: Secondary | ICD-10-CM

## 2016-01-16 ENCOUNTER — Other Ambulatory Visit: Payer: Self-pay | Admitting: General Surgery

## 2016-01-16 ENCOUNTER — Other Ambulatory Visit: Payer: Self-pay

## 2016-01-16 DIAGNOSIS — Z853 Personal history of malignant neoplasm of breast: Secondary | ICD-10-CM

## 2016-01-24 ENCOUNTER — Ambulatory Visit
Admission: RE | Admit: 2016-01-24 | Discharge: 2016-01-24 | Disposition: A | Discharge status: Home / Self Care | Payer: PPO | Source: Ambulatory Visit | Attending: General Surgery | Admitting: General Surgery

## 2016-01-24 DIAGNOSIS — R928 Other abnormal and inconclusive findings on diagnostic imaging of breast: Secondary | ICD-10-CM | POA: Diagnosis not present

## 2016-01-24 DIAGNOSIS — Z853 Personal history of malignant neoplasm of breast: Secondary | ICD-10-CM

## 2016-02-13 DIAGNOSIS — L720 Epidermal cyst: Secondary | ICD-10-CM | POA: Diagnosis not present

## 2016-02-13 DIAGNOSIS — J45998 Other asthma: Secondary | ICD-10-CM | POA: Diagnosis not present

## 2016-02-13 DIAGNOSIS — Z6837 Body mass index (BMI) 37.0-37.9, adult: Secondary | ICD-10-CM | POA: Diagnosis not present

## 2016-02-13 DIAGNOSIS — I10 Essential (primary) hypertension: Secondary | ICD-10-CM | POA: Diagnosis not present

## 2016-02-13 DIAGNOSIS — C50111 Malignant neoplasm of central portion of right female breast: Secondary | ICD-10-CM | POA: Diagnosis not present

## 2016-02-13 DIAGNOSIS — I2510 Atherosclerotic heart disease of native coronary artery without angina pectoris: Secondary | ICD-10-CM | POA: Diagnosis not present

## 2016-03-11 DIAGNOSIS — M65311 Trigger thumb, right thumb: Secondary | ICD-10-CM | POA: Diagnosis not present

## 2016-03-11 DIAGNOSIS — M65312 Trigger thumb, left thumb: Secondary | ICD-10-CM | POA: Diagnosis not present

## 2016-03-11 DIAGNOSIS — M0609 Rheumatoid arthritis without rheumatoid factor, multiple sites: Secondary | ICD-10-CM | POA: Diagnosis not present

## 2016-03-11 DIAGNOSIS — Z79899 Other long term (current) drug therapy: Secondary | ICD-10-CM | POA: Diagnosis not present

## 2016-03-13 ENCOUNTER — Ambulatory Visit: Payer: PPO | Admitting: Cardiovascular Disease

## 2016-03-20 ENCOUNTER — Encounter: Payer: Self-pay | Admitting: Cardiovascular Disease

## 2016-03-20 ENCOUNTER — Ambulatory Visit (INDEPENDENT_AMBULATORY_CARE_PROVIDER_SITE_OTHER): Payer: PPO | Admitting: Cardiovascular Disease

## 2016-03-20 VITALS — BP 146/74 | HR 85 | Ht 65.0 in | Wt 235.4 lb

## 2016-03-20 DIAGNOSIS — I10 Essential (primary) hypertension: Secondary | ICD-10-CM | POA: Diagnosis not present

## 2016-03-20 DIAGNOSIS — I2510 Atherosclerotic heart disease of native coronary artery without angina pectoris: Secondary | ICD-10-CM

## 2016-03-20 DIAGNOSIS — I25118 Atherosclerotic heart disease of native coronary artery with other forms of angina pectoris: Secondary | ICD-10-CM | POA: Diagnosis not present

## 2016-03-20 DIAGNOSIS — E785 Hyperlipidemia, unspecified: Secondary | ICD-10-CM

## 2016-03-20 NOTE — Progress Notes (Signed)
Patient ID: [name], female   DOB: 1941-06-01, 75 y.o.   MRN: [id number]    Cardiology Office Note    Date:  03/20/2016   ID:  [name], DOB [birth year], MRN [id number]  PCP:  [name], MD  Cardiologist:    Sanda Klein, MD   Chief Complaint  Patient presents with  . Annual Exam    patient reports having som indigestion.    History of Present Illness:  [name] is a 75 y.o. female with a history of moderate coronary artery disease, hypertension, hyperlipidemia, severe obesity, reactive airway disease and treated hypothyroidism returning for follow-up. She has recently had some indigestion that is clearly responsive to antacids and related to supine position, consistent with acid reflux. She has not had any exertional chest discomfort. She does describe functional class II dyspnea on exertion which she attributes to her weight. She has mild ankle swelling. She is taking Crestor 10 mg twice weekly and has not tolerated higher doses of this medication. She reports she is not taking NSAIDs, she is not taking the meloxicam listed in her medications.  She had an intermediate severity stenosis of the mid LAD artery. This was demonstrated by coronary angiography in 2011, but shown not to be hemodynamically important by pressure wire analysis. She had some problems with intrascapular pressure in 2013 and had a nuclear stress test that showed normal myocardial perfusion. Has normal left ventricular systolic function.  Past Medical History  Diagnosis Date  . GERD (gastroesophageal reflux disease)   . Asthma     daily and prn inhalers  . Grave's disease     no current meds.  . Arthritis     osteo - s/p right total knee  . Spondylosis     cervical and lumbar  . Hyperlipidemia   . Breast cancer ([place]) 10/28/11    right breast  . Hypertension     under control with med.  . S/P chemotherapy, time since 4-12 weeks finished 02/20/2011  . Diabetes mellitus     diet-controlled  . History  of chemotherapy     neoadjuvant: 5 cycles of Taxol/carboplatinum:  last dose 02/24/12  . Boil, axilla   . S/P radiation therapy 05/03/12 - 07/11/12    Right Breast High Axilla and Supraclavicular gegion: 4500 cGy/25 Fractions with boost for a total of 6300 cGy  . Use of letrozole (Femara) start 07/21/12  . Sleep apnea sleep study 09/18/2005    uses CPAP occ. - to bring machine DOS  . CAD (coronary artery disease)   . Hypothyroid     Past Surgical History  Procedure Laterality Date  . Lumbar laminectomy      back surg. x 2 - 1980's and 1999  . Total knee arthroplasty  07/28/2006    right  . Cataract extraction, bilateral    . Bilateral oophorectomy  2009  . Partial hysterectomy      BSO  . Cardiac catheterization  04/10/2010, 05/07/2004    LAD lesion, blood flow OK, per pt.  . Portacath placement  11/27/2011    Procedure: INSERTION PORT-A-CATH;  Surgeon: Adin Hector, MD;  Location: [facility];  Service: General;  Laterality: Right;  . Lesion removal  11/27/2011    Procedure: LESION REMOVAL;  Surgeon: Adin Hector, MD;  Location: [facility];  Service: General;  Laterality: Right;  Skin tag removed from under right eye. No specimen  . Abdominal hysterectomy    . Carpal  tunnel release      left  . Mastectomy partial / lumpectomy  04/01/12    right breast, inv ductal ca, ER/PR  +, HER 2 -   . Breast surgery    . Joint replacement  2006    RTK  . Esophagogastroduodenoscopy  12/20/2012    Procedure: ESOPHAGOGASTRODUODENOSCOPY (EGD);  Surgeon: Lafayette Dragon, MD;  Location: [facility];  Service: Endoscopy;  Laterality: N/A;  . Colonoscopy  12/20/2012    Procedure: COLONOSCOPY;  Surgeon: Lafayette Dragon, MD;  Location: [facility];  Service: Endoscopy;  Laterality: N/A;  . US echocardiography  08/14/2008    technically difficult-mild LVH, EF =>55%,mild annular ca+,AOV mildly sclerotic  . Nm myoview ltd  08/02/2012    no ischemia    Current  Medications: Outpatient Prescriptions Prior to Visit  Medication Sig Dispense Refill  . albuterol (PROVENTIL HFA;VENTOLIN HFA) 108 (90 BASE) MCG/ACT inhaler Inhale 2 puffs into the lungs every 4 (four) hours as needed. For shortnes of breath    . aspirin 81 MG tablet Take 81 mg by mouth daily.     Marland Kitchen diltiazem (TIAZAC) 240 MG 24 hr capsule Take 240 mg by mouth every morning.     . Fluticasone-Salmeterol (ADVAIR) 100-50 MCG/DOSE AEPB Inhale 1 puff into the lungs every 12 (twelve) hours.     . folic acid (FOLVITE) 1 MG tablet Take 1 mg by mouth daily.    Marland Kitchen letrozole (FEMARA) 2.5 MG tablet TAKE 1 BY MOUTH DAILY 90 tablet 0  . levothyroxine (SYNTHROID, LEVOTHROID) 50 MCG tablet Take 50 mcg by mouth daily.  2  . meloxicam (MOBIC) 7.5 MG tablet Take 5 mg by mouth as needed. For knee tightness    . methotrexate 25 MG/ML injection   2  . ranitidine (ZANTAC) 150 MG tablet Take 150 mg by mouth at bedtime.     . triamterene-hydrochlorothiazide (MAXZIDE-25) 37.5-25 MG per tablet Take 1 tablet by mouth every morning. 1/2 tab in the AM    . Cholecalciferol (VITAMIN D) 2000 UNITS tablet Take 2,000 Units by mouth daily.    . rosuvastatin (CRESTOR) 5 MG tablet Take 1 tablet (5 mg total) by mouth 2 (two) times a week. 15 tablet 0   No facility-administered medications prior to visit.     Allergies:   Crestor; Lactose intolerance (gi); Lipitor; and Zocor   Social History   Social History  . Marital Status: Single    Spouse Name: N/A  . Number of Children: N/A  . Years of Education: N/A   Occupational History  . retired    Social History Main Topics  . Smoking status: Never Smoker   . Smokeless tobacco: Never Used  . Alcohol Use: No  . Drug Use: No  . Sexual Activity: Not Currently   Other Topics Concern  . None   Social History Narrative   Retired ED nurse from [employer], single, 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      G0     Family History:  The patient's family history includes Anesthesia problems  in her cousin; Cancer in her cousin, cousin, cousin, and maternal grandfather.   ROS:   Please see the history of present illness.    ROS All other systems reviewed and are negative.   PHYSICAL EXAM:   VS:  BP 146/74 mmHg  Pulse 85  Ht 5\' 5"  (1.651 m)  Wt 106.777 kg (235 lb 6.4 oz)  BMI 39.17 kg/m2   GEN: Well nourished, well developed, in no  acute distress HEENT: normal Neck: no JVD, carotid bruits, or masses Cardiac: RRR; no murmurs, rubs, or gallops, symmetrical 1+ ankle edema  Respiratory:  clear to auscultation bilaterally, normal work of breathing GI: soft, nontender, nondistended, + BS MS: no deformity or atrophy Skin: warm and dry, no rash Neuro:  Alert and Oriented x 3, Strength and sensation are intact Psych: euthymic mood, full affect  Wt Readings from Last 3 Encounters:  03/20/16 106.777 kg (235 lb 6.4 oz)  06/18/15 100.472 kg (221 lb 8 oz)  02/27/15 100.699 kg (222 lb)      Studies/Labs Reviewed:   EKG:  EKG is ordered today.  The ekg ordered today demonstrates Normal sinus rhythm, poor R progression, minor ST-T-T-segment changes in leads 1 and aVL, no change from previous tracings  Recent Labs: No results found for requested labs within last 365 days.   Lipid Panel    Component Value Date/Time   CHOL 223* 02/22/2015 0935   TRIG 92 02/22/2015 0935   HDL 64 02/22/2015 0935   CHOLHDL 3.5 02/22/2015 0935   VLDL 18 02/22/2015 0935   LDLCALC 141* 02/22/2015 0935      ASSESSMENT:    1. Coronary artery disease involving native coronary artery of native heart with other form of angina pectoris ([place])   2. Hyperlipidemia, poorly tolerant of statins   3. Essential hypertension      PLAN:  In order of problems listed above:  1. CAD: Currently asymptomatic. The focus is on treatment of risk factors. If she does develop angina pectoris, it may be worthwhile proceeding directly to cardiac catheterization since her body habitus would put her at very high  risk for attenuation artifact of the anterior wall and she has a known intermediate LAD artery stenosis. 2. HLP: This appears to be the highest dose of statin that she can tolerate. 3. HTN: Blood pressure borderline high today. No changes were made. Asked her to keep on monitoring at home. Viewed sodium restricted diet and regular physical exercise.  I would not increase diltiazem since this would probably worsen her ankle edema. She had a history of orthostatic hypotension when she was on higher doses of diuretics in the past she uses beta agonist inhalers so I would avoid beta blockers. Would probably add an ACE inhibitor or angiotensin receptor blocker if it was necessary to treat her blood pressure more aggressively. She has an upcoming appointment with Dr. Joylene Draft where we can check her blood pressure 1 more time before making adjustments. 4. Obesity: She is approaching morbid obesity range. I'm sure this is contributing to her dyspnea and leg edema. Weight loss strongly encouraged.    Medication Adjustments/Labs and Tests Ordered: Current medicines are reviewed at length with the patient today.  Concerns regarding medicines are outlined above.  Medication changes, Labs and Tests ordered today are listed in the Patient Instructions below. Patient Instructions  Your physician recommends that you continue on your current medications as directed. Please refer to the Current Medication list given to you today.  Dr Sallyanne Kuster recommends that you schedule a follow-up appointment in 1 year. You will receive a reminder letter in the mail two months in advance. If you don't receive a letter, please call our office to schedule the follow-up appointment.  If you need a refill on your cardiac medications before your next appointment, please call your pharmacy.  Weigh daily. Call [phone] if weight climbs more than 3 pounds in a day or 5 pounds in a week.  No salt to very little salt in your diet.  No more  than 2000 mg in a day. Call if increased shortness of breath or increased swelling.  Low-Sodium Eating Plan Sodium raises blood pressure and causes water to be held in the body. Getting less sodium from food will help lower your blood pressure, reduce any swelling, and protect your heart, liver, and kidneys. We get sodium by adding salt (sodium chloride) to food. Most of our sodium comes from canned, boxed, and frozen foods. Restaurant foods, fast foods, and pizza are also very high in sodium. Even if you take medicine to lower your blood pressure or to reduce fluid in your body, getting less sodium from your food is important. WHAT IS MY PLAN? Most people should limit their sodium intake to 2,300 mg a day. Your health care provider recommends that you limit your sodium intake to __________ a day.  WHAT DO I NEED TO KNOW ABOUT THIS EATING PLAN? For the low-sodium eating plan, you will follow these general guidelines:  Choose foods with a % Daily Value for sodium of less than 5% (as listed on the food label).   Use salt-free seasonings or herbs instead of table salt or sea salt.   Check with your health care provider or pharmacist before using salt substitutes.   Eat fresh foods.  Eat more vegetables and fruits.  Limit canned vegetables. If you do use them, rinse them well to decrease the sodium.   Limit cheese to 1 oz (28 g) per day.   Eat lower-sodium products, often labeled as "lower sodium" or "no salt added."  Avoid foods that contain monosodium glutamate (MSG). MSG is sometimes added to [place] food and some canned foods.  Check food labels (Nutrition Facts labels) on foods to learn how much sodium is in one serving.  Eat more home-cooked food and less restaurant, buffet, and fast food.  When eating at a restaurant, ask that your food be prepared with less salt, or no salt if possible.  HOW DO I READ FOOD LABELS FOR SODIUM INFORMATION? The Nutrition Facts label lists  the amount of sodium in one serving of the food. If you eat more than one serving, you must multiply the listed amount of sodium by the number of servings. Food labels may also identify foods as:  Sodium free--Less than 5 mg in a serving.  Very low sodium--35 mg or less in a serving.  Low sodium--140 mg or less in a serving.  Light in sodium--50% less sodium in a serving. For example, if a food that usually has 300 mg of sodium is changed to become light in sodium, it will have 150 mg of sodium.  Reduced sodium--25% less sodium in a serving. For example, if a food that usually has 400 mg of sodium is changed to reduced sodium, it will have 300 mg of sodium. WHAT FOODS CAN I EAT? Grains Low-sodium cereals, including oats, puffed wheat and rice, and shredded wheat cereals. Low-sodium crackers. Unsalted rice and pasta. Lower-sodium bread.  Vegetables Frozen or fresh vegetables. Low-sodium or reduced-sodium canned vegetables. Low-sodium or reduced-sodium tomato sauce and paste. Low-sodium or reduced-sodium tomato and vegetable juices.  Fruits Fresh, frozen, and canned fruit. Fruit juice.  Meat and Other Protein Products Low-sodium canned tuna and salmon. Fresh or frozen meat, poultry, seafood, and fish. Lamb. Unsalted nuts. Dried beans, peas, and lentils without added salt. Unsalted canned beans. Homemade soups without salt. Eggs.  Dairy Milk. Soy milk. Ricotta cheese. Low-sodium  or reduced-sodium cheeses. Yogurt.  Condiments Fresh and dried herbs and spices. Salt-free seasonings. Onion and garlic powders. Low-sodium varieties of mustard and ketchup. Fresh or refrigerated horseradish. Lemon juice.  Fats and Oils Reduced-sodium salad dressings. Unsalted butter.  Other Unsalted popcorn and pretzels.  The items listed above may not be a complete list of recommended foods or beverages. Contact your dietitian for more options. WHAT FOODS ARE NOT RECOMMENDED? Grains Instant hot  cereals. Bread stuffing, pancake, and biscuit mixes. Croutons. Seasoned rice or pasta mixes. Noodle soup cups. Boxed or frozen macaroni and cheese. Self-rising flour. Regular salted crackers. Vegetables Regular canned vegetables. Regular canned tomato sauce and paste. Regular tomato and vegetable juices. Frozen vegetables in sauces. Salted [place] fries. Olives. Angie Fava. Relishes. Sauerkraut. Salsa. Meat and Other Protein Products Salted, canned, smoked, spiced, or pickled meats, seafood, or fish. Bacon, ham, sausage, hot dogs, corned beef, chipped beef, and packaged luncheon meats. Salt pork. Jerky. Pickled herring. Anchovies, regular canned tuna, and sardines. Salted nuts. Dairy Processed cheese and cheese spreads. Cheese curds. Blue cheese and cottage cheese. Buttermilk.  Condiments Onion and garlic salt, seasoned salt, table salt, and sea salt. Canned and packaged gravies. Worcestershire sauce. Tartar sauce. Barbecue sauce. Teriyaki sauce. Soy sauce, including reduced sodium. Steak sauce. Fish sauce. Oyster sauce. Cocktail sauce. Horseradish that you find on the shelf. Regular ketchup and mustard. Meat flavorings and tenderizers. Bouillon cubes. Hot sauce. Tabasco sauce. Marinades. Taco seasonings. Relishes. Fats and Oils Regular salad dressings. Salted butter. Margarine. Ghee. Bacon fat.  Other Potato and tortilla chips. Corn chips and puffs. Salted popcorn and pretzels. Canned or dried soups. Pizza. Frozen entrees and pot pies.  The items listed above may not be a complete list of foods and beverages to avoid. Contact your dietitian for more information.   This information is not intended to replace advice given to you by your health care provider. Make sure you discuss any questions you have with your health care provider.   Document Released: 05/29/2002 Document Revised: 12/28/2014 Document Reviewed: 10/11/2013 Elsevier Interactive Patient Education 2016 [place], [place], MD  03/20/2016 11:32 AM    [address], [place], [place]  [address] Phone: [phone]; Fax: [phone]

## 2016-03-20 NOTE — Patient Instructions (Signed)
Your physician recommends that you continue on your current medications as directed. Please refer to the Current Medication list given to you today.  Dr Sallyanne Kuster recommends that you schedule a follow-up appointment in 1 year. You will receive a reminder letter in the mail two months in advance. If you don't receive a letter, please call our office to schedule the follow-up appointment.  If you need a refill on your cardiac medications before your next appointment, please call your pharmacy.  Weigh daily. Call [phone] if weight climbs more than 3 pounds in a day or 5 pounds in a week. No salt to very little salt in your diet.  No more than 2000 mg in a day. Call if increased shortness of breath or increased swelling.  Low-Sodium Eating Plan Sodium raises blood pressure and causes water to be held in the body. Getting less sodium from food will help lower your blood pressure, reduce any swelling, and protect your heart, liver, and kidneys. We get sodium by adding salt (sodium chloride) to food. Most of our sodium comes from canned, boxed, and frozen foods. Restaurant foods, fast foods, and pizza are also very high in sodium. Even if you take medicine to lower your blood pressure or to reduce fluid in your body, getting less sodium from your food is important. WHAT IS MY PLAN? Most people should limit their sodium intake to 2,300 mg a day. Your health care provider recommends that you limit your sodium intake to __________ a day.  WHAT DO I NEED TO KNOW ABOUT THIS EATING PLAN? For the low-sodium eating plan, you will follow these general guidelines:  Choose foods with a % Daily Value for sodium of less than 5% (as listed on the food label).   Use salt-free seasonings or herbs instead of table salt or sea salt.   Check with your health care provider or pharmacist before using salt substitutes.   Eat fresh foods.  Eat more vegetables and fruits.  Limit canned vegetables. If you do use  them, rinse them well to decrease the sodium.   Limit cheese to 1 oz (28 g) per day.   Eat lower-sodium products, often labeled as "lower sodium" or "no salt added."  Avoid foods that contain monosodium glutamate (MSG). MSG is sometimes added to [place] food and some canned foods.  Check food labels (Nutrition Facts labels) on foods to learn how much sodium is in one serving.  Eat more home-cooked food and less restaurant, buffet, and fast food.  When eating at a restaurant, ask that your food be prepared with less salt, or no salt if possible.  HOW DO I READ FOOD LABELS FOR SODIUM INFORMATION? The Nutrition Facts label lists the amount of sodium in one serving of the food. If you eat more than one serving, you must multiply the listed amount of sodium by the number of servings. Food labels may also identify foods as:  Sodium free--Less than 5 mg in a serving.  Very low sodium--35 mg or less in a serving.  Low sodium--140 mg or less in a serving.  Light in sodium--50% less sodium in a serving. For example, if a food that usually has 300 mg of sodium is changed to become light in sodium, it will have 150 mg of sodium.  Reduced sodium--25% less sodium in a serving. For example, if a food that usually has 400 mg of sodium is changed to reduced sodium, it will have 300 mg of sodium. WHAT FOODS CAN I  EAT? Grains Low-sodium cereals, including oats, puffed wheat and rice, and shredded wheat cereals. Low-sodium crackers. Unsalted rice and pasta. Lower-sodium bread.  Vegetables Frozen or fresh vegetables. Low-sodium or reduced-sodium canned vegetables. Low-sodium or reduced-sodium tomato sauce and paste. Low-sodium or reduced-sodium tomato and vegetable juices.  Fruits Fresh, frozen, and canned fruit. Fruit juice.  Meat and Other Protein Products Low-sodium canned tuna and salmon. Fresh or frozen meat, poultry, seafood, and fish. Lamb. Unsalted nuts. Dried beans, peas, and lentils  without added salt. Unsalted canned beans. Homemade soups without salt. Eggs.  Dairy Milk. Soy milk. Ricotta cheese. Low-sodium or reduced-sodium cheeses. Yogurt.  Condiments Fresh and dried herbs and spices. Salt-free seasonings. Onion and garlic powders. Low-sodium varieties of mustard and ketchup. Fresh or refrigerated horseradish. Lemon juice.  Fats and Oils Reduced-sodium salad dressings. Unsalted butter.  Other Unsalted popcorn and pretzels.  The items listed above may not be a complete list of recommended foods or beverages. Contact your dietitian for more options. WHAT FOODS ARE NOT RECOMMENDED? Grains Instant hot cereals. Bread stuffing, pancake, and biscuit mixes. Croutons. Seasoned rice or pasta mixes. Noodle soup cups. Boxed or frozen macaroni and cheese. Self-rising flour. Regular salted crackers. Vegetables Regular canned vegetables. Regular canned tomato sauce and paste. Regular tomato and vegetable juices. Frozen vegetables in sauces. Salted [place] fries. Olives. Angie Fava. Relishes. Sauerkraut. Salsa. Meat and Other Protein Products Salted, canned, smoked, spiced, or pickled meats, seafood, or fish. Bacon, ham, sausage, hot dogs, corned beef, chipped beef, and packaged luncheon meats. Salt pork. Jerky. Pickled herring. Anchovies, regular canned tuna, and sardines. Salted nuts. Dairy Processed cheese and cheese spreads. Cheese curds. Blue cheese and cottage cheese. Buttermilk.  Condiments Onion and garlic salt, seasoned salt, table salt, and sea salt. Canned and packaged gravies. Worcestershire sauce. Tartar sauce. Barbecue sauce. Teriyaki sauce. Soy sauce, including reduced sodium. Steak sauce. Fish sauce. Oyster sauce. Cocktail sauce. Horseradish that you find on the shelf. Regular ketchup and mustard. Meat flavorings and tenderizers. Bouillon cubes. Hot sauce. Tabasco sauce. Marinades. Taco seasonings. Relishes. Fats and Oils Regular salad dressings. Salted butter.  Margarine. Ghee. Bacon fat.  Other Potato and tortilla chips. Corn chips and puffs. Salted popcorn and pretzels. Canned or dried soups. Pizza. Frozen entrees and pot pies.  The items listed above may not be a complete list of foods and beverages to avoid. Contact your dietitian for more information.   This information is not intended to replace advice given to you by your health care provider. Make sure you discuss any questions you have with your health care provider.   Document Released: 05/29/2002 Document Revised: 12/28/2014 Document Reviewed: 10/11/2013 Elsevier Interactive Patient Education [employer].

## 2016-04-10 DIAGNOSIS — I10 Essential (primary) hypertension: Secondary | ICD-10-CM | POA: Diagnosis not present

## 2016-04-10 DIAGNOSIS — E784 Other hyperlipidemia: Secondary | ICD-10-CM | POA: Diagnosis not present

## 2016-04-10 DIAGNOSIS — E038 Other specified hypothyroidism: Secondary | ICD-10-CM | POA: Diagnosis not present

## 2016-04-10 DIAGNOSIS — E559 Vitamin D deficiency, unspecified: Secondary | ICD-10-CM | POA: Diagnosis not present

## 2016-04-10 DIAGNOSIS — E119 Type 2 diabetes mellitus without complications: Secondary | ICD-10-CM | POA: Diagnosis not present

## 2016-04-16 DIAGNOSIS — M25552 Pain in left hip: Secondary | ICD-10-CM | POA: Diagnosis not present

## 2016-04-17 DIAGNOSIS — M069 Rheumatoid arthritis, unspecified: Secondary | ICD-10-CM | POA: Diagnosis not present

## 2016-04-17 DIAGNOSIS — C50919 Malignant neoplasm of unspecified site of unspecified female breast: Secondary | ICD-10-CM | POA: Diagnosis not present

## 2016-04-17 DIAGNOSIS — Z0000 Encounter for general adult medical examination without abnormal findings: Secondary | ICD-10-CM | POA: Diagnosis not present

## 2016-04-17 DIAGNOSIS — E784 Other hyperlipidemia: Secondary | ICD-10-CM | POA: Diagnosis not present

## 2016-04-17 DIAGNOSIS — E119 Type 2 diabetes mellitus without complications: Secondary | ICD-10-CM | POA: Diagnosis not present

## 2016-04-17 DIAGNOSIS — E038 Other specified hypothyroidism: Secondary | ICD-10-CM | POA: Diagnosis not present

## 2016-04-17 DIAGNOSIS — J45998 Other asthma: Secondary | ICD-10-CM | POA: Diagnosis not present

## 2016-04-17 DIAGNOSIS — M542 Cervicalgia: Secondary | ICD-10-CM | POA: Diagnosis not present

## 2016-04-17 DIAGNOSIS — E559 Vitamin D deficiency, unspecified: Secondary | ICD-10-CM | POA: Diagnosis not present

## 2016-04-17 DIAGNOSIS — I2510 Atherosclerotic heart disease of native coronary artery without angina pectoris: Secondary | ICD-10-CM | POA: Diagnosis not present

## 2016-04-17 DIAGNOSIS — Z6839 Body mass index (BMI) 39.0-39.9, adult: Secondary | ICD-10-CM | POA: Diagnosis not present

## 2016-04-17 DIAGNOSIS — Z779 Other contact with and (suspected) exposures hazardous to health: Secondary | ICD-10-CM | POA: Diagnosis not present

## 2016-04-17 DIAGNOSIS — Z1389 Encounter for screening for other disorder: Secondary | ICD-10-CM | POA: Diagnosis not present

## 2016-04-28 ENCOUNTER — Other Ambulatory Visit: Payer: Self-pay | Admitting: General Surgery

## 2016-04-28 DIAGNOSIS — L720 Epidermal cyst: Secondary | ICD-10-CM | POA: Diagnosis not present

## 2016-04-28 DIAGNOSIS — I10 Essential (primary) hypertension: Secondary | ICD-10-CM | POA: Diagnosis not present

## 2016-04-28 DIAGNOSIS — J45998 Other asthma: Secondary | ICD-10-CM | POA: Diagnosis not present

## 2016-04-28 DIAGNOSIS — L723 Sebaceous cyst: Secondary | ICD-10-CM | POA: Diagnosis not present

## 2016-04-28 DIAGNOSIS — I2510 Atherosclerotic heart disease of native coronary artery without angina pectoris: Secondary | ICD-10-CM | POA: Diagnosis not present

## 2016-04-28 DIAGNOSIS — G4730 Sleep apnea, unspecified: Secondary | ICD-10-CM | POA: Diagnosis not present

## 2016-04-28 DIAGNOSIS — C50111 Malignant neoplasm of central portion of right female breast: Secondary | ICD-10-CM | POA: Diagnosis not present

## 2016-04-28 DIAGNOSIS — Z6837 Body mass index (BMI) 37.0-37.9, adult: Secondary | ICD-10-CM | POA: Diagnosis not present

## 2016-04-28 DIAGNOSIS — R2231 Localized swelling, mass and lump, right upper limb: Secondary | ICD-10-CM | POA: Diagnosis not present

## 2016-05-25 DIAGNOSIS — Z780 Asymptomatic menopausal state: Secondary | ICD-10-CM | POA: Diagnosis not present

## 2016-05-25 DIAGNOSIS — E559 Vitamin D deficiency, unspecified: Secondary | ICD-10-CM | POA: Diagnosis not present

## 2016-06-01 ENCOUNTER — Other Ambulatory Visit: Payer: Self-pay

## 2016-06-01 MED ORDER — LETROZOLE 2.5 MG PO TABS: ORAL_TABLET | ORAL | Status: DC

## 2016-06-01 NOTE — Telephone Encounter (Signed)
Received VM from pt who is requesting her letrozole to be refilled at the [facility] in [place].  Refilled prescription per pt request. Left VM with pt informing her this was done and should be ready at her convenience.

## 2016-06-16 ENCOUNTER — Telehealth: Payer: Self-pay | Admitting: Hematology and Oncology

## 2016-06-16 ENCOUNTER — Ambulatory Visit: Payer: PPO | Admitting: Hematology and Oncology

## 2016-06-16 NOTE — Telephone Encounter (Signed)
per pt 6/27 pt has to cxl today's appt due to  pt has a dead battery and will miss appt. Pt stated that she will call back to r/s

## 2016-07-03 ENCOUNTER — Encounter: Payer: Self-pay | Admitting: Genetic Counselor

## 2016-07-16 ENCOUNTER — Telehealth: Payer: Self-pay | Admitting: Hematology and Oncology

## 2016-07-20 DIAGNOSIS — E039 Hypothyroidism, unspecified: Secondary | ICD-10-CM | POA: Diagnosis not present

## 2016-07-20 DIAGNOSIS — E119 Type 2 diabetes mellitus without complications: Secondary | ICD-10-CM | POA: Diagnosis not present

## 2016-07-24 ENCOUNTER — Ambulatory Visit (HOSPITAL_BASED_OUTPATIENT_CLINIC_OR_DEPARTMENT_OTHER): Payer: PPO | Admitting: Hematology and Oncology

## 2016-07-24 ENCOUNTER — Telehealth: Payer: Self-pay | Admitting: Hematology and Oncology

## 2016-07-24 ENCOUNTER — Encounter: Payer: Self-pay | Admitting: Hematology and Oncology

## 2016-07-24 DIAGNOSIS — Z170 Estrogen receptor positive status [ER+]: Secondary | ICD-10-CM | POA: Diagnosis not present

## 2016-07-24 DIAGNOSIS — Z79811 Long term (current) use of aromatase inhibitors: Secondary | ICD-10-CM | POA: Diagnosis not present

## 2016-07-24 DIAGNOSIS — C50411 Malignant neoplasm of upper-outer quadrant of right female breast: Secondary | ICD-10-CM | POA: Diagnosis not present

## 2016-07-24 NOTE — Progress Notes (Signed)
Patient Care Team: [name], MD as PCP - General (Internal Medicine)  DIAGNOSIS: Breast cancer of upper-outer quadrant of right female breast [facility])   Staging form: Breast, AJCC 7th Edition   - Clinical: Stage IIB (T3, N0, cM0) - Signed by Virgina Organ, FNP on 12/23/2011   - Pathologic: No stage assigned - Unsigned  CHIEF COMPLIANT: follow-up of breast cancer on letrozole  INTERVAL HISTORY: [name] is a 75 year old above-mentioned history of right breast cancer currently on hormonal therapy with letrozole and tolerating it extremely well without any major problems. She does have hot flashes. She was put on a recent thyroid medication which causes her to sweat profusely. Apart from that she denies any muscle aches or pains. She denies any lumps or nodules in the breasts.  REVIEW OF SYSTEMS:   Constitutional: Denies fevers, chills or abnormal weight loss Eyes: Denies blurriness of vision Ears, nose, mouth, throat, and face: Denies mucositis or sore throat Respiratory: Denies cough, dyspnea or wheezes Cardiovascular: Denies palpitation, chest discomfort Gastrointestinal:  Denies nausea, heartburn or change in bowel habits Skin: Denies abnormal skin rashes Lymphatics: Denies new lymphadenopathy or easy bruising Neurological:Denies numbness, tingling or new weaknesses Behavioral/Psych: Mood is stable, no new changes  Extremities: No lower extremity edema Breast:  denies any pain or lumps or nodules in either breasts All other systems were reviewed with the patient and are negative.  I have reviewed the past medical history, past surgical history, social history and family history with the patient and they are unchanged from previous note.  ALLERGIES:  is allergic to crestor [rosuvastatin]; lactose intolerance (gi); lipitor [atorvastatin calcium]; and zocor [simvastatin - high dose].  MEDICATIONS:  Current Outpatient Prescriptions  Medication Sig Dispense Refill  . albuterol  (PROVENTIL HFA;VENTOLIN HFA) 108 (90 BASE) MCG/ACT inhaler Inhale 2 puffs into the lungs every 4 (four) hours as needed. For shortnes of breath    . aspirin 81 MG tablet Take 81 mg by mouth daily.     . Cholecalciferol ([employer] VIT D 5000 HIGH-POTENCY) 5000 units capsule Take 5,000 Units by mouth daily.    Marland Kitchen diltiazem (TIAZAC) 240 MG 24 hr capsule Take 240 mg by mouth every morning.     . Fluticasone-Salmeterol (ADVAIR) 100-50 MCG/DOSE AEPB Inhale 1 puff into the lungs every 12 (twelve) hours.     . folic acid (FOLVITE) 1 MG tablet Take 1 mg by mouth daily.    Marland Kitchen letrozole (FEMARA) 2.5 MG tablet TAKE 1 BY MOUTH DAILY 90 tablet 0  . levothyroxine (SYNTHROID, LEVOTHROID) 50 MCG tablet Take 50 mcg by mouth daily.  2  . meloxicam (MOBIC) 7.5 MG tablet Take 5 mg by mouth as needed. For knee tightness    . methotrexate 25 MG/ML injection   2  . ranitidine (ZANTAC) 150 MG tablet Take 150 mg by mouth at bedtime.     . rosuvastatin (CRESTOR) 10 MG tablet Take 10 mg by mouth 2 (two) times a week.  10  . triamterene-hydrochlorothiazide (MAXZIDE-25) 37.5-25 MG per tablet Take 1 tablet by mouth every morning. 1/2 tab in the AM     No current facility-administered medications for this visit.     PHYSICAL EXAMINATION: ECOG PERFORMANCE STATUS: 1 - Symptomatic but completely ambulatory  Vitals:   07/24/16 1023  BP: (!) 160/78  Pulse: 92  Resp: 19  Temp: 98.7 F (37.1 C)   Filed Weights   07/24/16 1023  Weight: 231 lb 1.6 oz (104.8 kg)    GENERAL:alert,  no distress and comfortable SKIN: skin color, texture, turgor are normal, no rashes or significant lesions EYES: normal, Conjunctiva are pink and non-injected, sclera clear OROPHARYNX:no exudate, no erythema and lips, buccal mucosa, and tongue normal  NECK: supple, thyroid normal size, non-tender, without nodularity LYMPH:  no palpable lymphadenopathy in the cervical, axillary or inguinal LUNGS: clear to auscultation and percussion with normal  breathing effort HEART: regular rate & rhythm and no murmurs and no lower extremity edema ABDOMEN:abdomen soft, non-tender and normal bowel sounds MUSCULOSKELETAL:no cyanosis of digits and no clubbing  NEURO: alert & oriented x 3 with fluent speech, no focal motor/sensory deficits EXTREMITIES: No lower extremity edema BREAST: No palpable masses or nodules in either right or left breasts. No palpable axillary supraclavicular or infraclavicular adenopathy no breast tenderness or nipple discharge. (exam performed in the presence of a chaperone)  LABORATORY DATA:  I have reviewed the data as listed   Chemistry      Component Value Date/Time   NA 141 02/22/2015 0935   NA 142 12/17/2014 1039   K 3.8 02/22/2015 0935   K 3.5 12/17/2014 1039   CL 101 02/22/2015 0935   CL 101 04/24/2013 0904   CO2 31 02/22/2015 0935   CO2 31 (H) 12/17/2014 1039   BUN 15 02/22/2015 0935   BUN 18.3 12/17/2014 1039   CREATININE 0.98 02/22/2015 0935   CREATININE 0.9 12/17/2014 1039      Component Value Date/Time   CALCIUM 10.2 02/22/2015 0935   CALCIUM 10.2 12/17/2014 1039   ALKPHOS 71 02/22/2015 0935   ALKPHOS 77 12/17/2014 1039   AST 18 02/22/2015 0935   AST 16 12/17/2014 1039   ALT 19 02/22/2015 0935   ALT 20 12/17/2014 1039   BILITOT 0.5 02/22/2015 0935   BILITOT 0.37 12/17/2014 1039       Lab Results  Component Value Date   WBC 4.2 12/17/2014   HGB 12.2 12/17/2014   HCT 37.3 12/17/2014   MCV 93.1 12/17/2014   PLT 290 12/17/2014   NEUTROABS 2.5 12/17/2014     ASSESSMENT & PLAN:  Breast cancer of upper-outer quadrant of right female breast ([place]) Right breast invasive ductal carcinoma with DCIS 2.5 cm with the MRI showing a maximum diameter of 7.5 cm ER/PR positive HER-2 negative Ki-67 15% treated with neoadjuvant chemotherapy with Taxotere and Cytoxan 12/02/2011 4 followed by surgery with lumpectomy which showed IDC with lymphovascular invasion 1.9 cm, 2 sentinel lymph nodes negative,  followed by radiation therapy and now on antiestrogen therapy with letrozole 2.5 mg daily from 07/21/2012  Letrozole toxicities: Occasional hot flashes Patient will finish 5 years of treatment by August 2018. I recommended obtaining breast cancer index to determine if she would benefit from extended adjuvant therapy. I will call her with the results of breast cancer index.  Breast cancer surveillance: 1. Breast exam 07/24/2016 normal 2. Mammogram 01/24/2016 normal, breast density category B  Return to clinic in 1 yr   No orders of the defined types were placed in this encounter.  The patient has a good understanding of the overall plan. she agrees with it. she will call with any problems that may develop before the next visit here.   Rulon Eisenmenger, MD 07/24/16

## 2016-07-24 NOTE — Telephone Encounter (Signed)
per pof to sch pt appt-gave pt copy of avs °

## 2016-07-24 NOTE — Assessment & Plan Note (Signed)
Right breast invasive ductal carcinoma with DCIS 2.5 cm with the MRI showing a maximum diameter of 7.5 cm ER/PR positive HER-2 negative Ki-67 15% treated with neoadjuvant chemotherapy with Taxotere and Cytoxan 12/02/2011 4 followed by surgery with lumpectomy which showed IDC with lymphovascular invasion 1.9 cm, 2 sentinel lymph nodes negative, followed by radiation therapy and now on antiestrogen therapy with letrozole 2.5 mg daily from 07/21/2012  Letrozole toxicities: Occasional hot flashes Patient will finish 5 years of treatment by August 2018. I recommended obtaining breast cancer index to determine if she would benefit from extended adjuvant therapy.  Breast cancer surveillance: 1. Breast exam 07/24/2016 normal 2. Mammogram 01/24/2016 normal, breast density category B  Return to clinic in 12 months

## 2016-08-28 ENCOUNTER — Other Ambulatory Visit: Payer: Self-pay | Admitting: Hematology and Oncology

## 2016-09-30 DIAGNOSIS — Z79899 Other long term (current) drug therapy: Secondary | ICD-10-CM | POA: Diagnosis not present

## 2016-09-30 DIAGNOSIS — M65312 Trigger thumb, left thumb: Secondary | ICD-10-CM | POA: Diagnosis not present

## 2016-09-30 DIAGNOSIS — M65311 Trigger thumb, right thumb: Secondary | ICD-10-CM | POA: Diagnosis not present

## 2016-09-30 DIAGNOSIS — M0609 Rheumatoid arthritis without rheumatoid factor, multiple sites: Secondary | ICD-10-CM | POA: Diagnosis not present

## 2016-09-30 DIAGNOSIS — Z23 Encounter for immunization: Secondary | ICD-10-CM | POA: Diagnosis not present

## 2016-10-23 DIAGNOSIS — I10 Essential (primary) hypertension: Secondary | ICD-10-CM | POA: Diagnosis not present

## 2016-10-23 DIAGNOSIS — I2510 Atherosclerotic heart disease of native coronary artery without angina pectoris: Secondary | ICD-10-CM | POA: Diagnosis not present

## 2016-10-23 DIAGNOSIS — J45998 Other asthma: Secondary | ICD-10-CM | POA: Diagnosis not present

## 2016-10-23 DIAGNOSIS — Z6838 Body mass index (BMI) 38.0-38.9, adult: Secondary | ICD-10-CM | POA: Diagnosis not present

## 2016-10-23 DIAGNOSIS — R1031 Right lower quadrant pain: Secondary | ICD-10-CM | POA: Diagnosis not present

## 2016-10-23 DIAGNOSIS — C50919 Malignant neoplasm of unspecified site of unspecified female breast: Secondary | ICD-10-CM | POA: Diagnosis not present

## 2016-10-23 DIAGNOSIS — M069 Rheumatoid arthritis, unspecified: Secondary | ICD-10-CM | POA: Diagnosis not present

## 2016-10-23 DIAGNOSIS — E119 Type 2 diabetes mellitus without complications: Secondary | ICD-10-CM | POA: Diagnosis not present

## 2016-10-23 DIAGNOSIS — E038 Other specified hypothyroidism: Secondary | ICD-10-CM | POA: Diagnosis not present

## 2017-01-13 ENCOUNTER — Other Ambulatory Visit: Payer: Self-pay | Admitting: General Surgery

## 2017-01-13 DIAGNOSIS — Z853 Personal history of malignant neoplasm of breast: Secondary | ICD-10-CM

## 2017-01-20 DIAGNOSIS — E0590 Thyrotoxicosis, unspecified without thyrotoxic crisis or storm: Secondary | ICD-10-CM | POA: Diagnosis not present

## 2017-01-20 DIAGNOSIS — H26491 Other secondary cataract, right eye: Secondary | ICD-10-CM | POA: Diagnosis not present

## 2017-01-20 DIAGNOSIS — E119 Type 2 diabetes mellitus without complications: Secondary | ICD-10-CM | POA: Diagnosis not present

## 2017-01-20 DIAGNOSIS — H5213 Myopia, bilateral: Secondary | ICD-10-CM | POA: Diagnosis not present

## 2017-02-04 DIAGNOSIS — H26492 Other secondary cataract, left eye: Secondary | ICD-10-CM | POA: Diagnosis not present

## 2017-03-05 ENCOUNTER — Inpatient Hospital Stay: Admission: RE | Admit: 2017-03-05 | Payer: PPO | Source: Ambulatory Visit

## 2017-03-09 ENCOUNTER — Ambulatory Visit
Admission: RE | Admit: 2017-03-09 | Discharge: 2017-03-09 | Disposition: A | Discharge status: Home / Self Care | Payer: PPO | Source: Ambulatory Visit | Attending: General Surgery | Admitting: General Surgery

## 2017-03-09 DIAGNOSIS — Z853 Personal history of malignant neoplasm of breast: Secondary | ICD-10-CM

## 2017-03-09 DIAGNOSIS — R928 Other abnormal and inconclusive findings on diagnostic imaging of breast: Secondary | ICD-10-CM | POA: Diagnosis not present

## 2017-03-31 DIAGNOSIS — M25559 Pain in unspecified hip: Secondary | ICD-10-CM | POA: Diagnosis not present

## 2017-03-31 DIAGNOSIS — Z79899 Other long term (current) drug therapy: Secondary | ICD-10-CM | POA: Diagnosis not present

## 2017-03-31 DIAGNOSIS — M0609 Rheumatoid arthritis without rheumatoid factor, multiple sites: Secondary | ICD-10-CM | POA: Diagnosis not present

## 2017-03-31 DIAGNOSIS — M25569 Pain in unspecified knee: Secondary | ICD-10-CM | POA: Diagnosis not present

## 2017-04-26 DIAGNOSIS — I10 Essential (primary) hypertension: Secondary | ICD-10-CM | POA: Diagnosis not present

## 2017-04-26 DIAGNOSIS — C50111 Malignant neoplasm of central portion of right female breast: Secondary | ICD-10-CM | POA: Diagnosis not present

## 2017-04-26 DIAGNOSIS — J45909 Unspecified asthma, uncomplicated: Secondary | ICD-10-CM | POA: Diagnosis not present

## 2017-04-26 DIAGNOSIS — Z6837 Body mass index (BMI) 37.0-37.9, adult: Secondary | ICD-10-CM | POA: Diagnosis not present

## 2017-04-26 DIAGNOSIS — G4730 Sleep apnea, unspecified: Secondary | ICD-10-CM | POA: Diagnosis not present

## 2017-04-27 DIAGNOSIS — E038 Other specified hypothyroidism: Secondary | ICD-10-CM | POA: Diagnosis not present

## 2017-04-27 DIAGNOSIS — N390 Urinary tract infection, site not specified: Secondary | ICD-10-CM | POA: Diagnosis not present

## 2017-04-27 DIAGNOSIS — I10 Essential (primary) hypertension: Secondary | ICD-10-CM | POA: Diagnosis not present

## 2017-04-27 DIAGNOSIS — E559 Vitamin D deficiency, unspecified: Secondary | ICD-10-CM | POA: Diagnosis not present

## 2017-04-27 DIAGNOSIS — R8299 Other abnormal findings in urine: Secondary | ICD-10-CM | POA: Diagnosis not present

## 2017-04-27 DIAGNOSIS — E784 Other hyperlipidemia: Secondary | ICD-10-CM | POA: Diagnosis not present

## 2017-04-27 DIAGNOSIS — E119 Type 2 diabetes mellitus without complications: Secondary | ICD-10-CM | POA: Diagnosis not present

## 2017-04-30 ENCOUNTER — Encounter: Payer: Self-pay | Admitting: Cardiovascular Disease

## 2017-04-30 ENCOUNTER — Ambulatory Visit (INDEPENDENT_AMBULATORY_CARE_PROVIDER_SITE_OTHER): Payer: PPO | Admitting: Cardiovascular Disease

## 2017-04-30 ENCOUNTER — Encounter (INDEPENDENT_AMBULATORY_CARE_PROVIDER_SITE_OTHER): Payer: Self-pay

## 2017-04-30 VITALS — BP 120/72 | HR 73 | Ht 65.0 in | Wt 230.8 lb

## 2017-04-30 DIAGNOSIS — I10 Essential (primary) hypertension: Secondary | ICD-10-CM

## 2017-04-30 DIAGNOSIS — Z6835 Body mass index (BMI) 35.0-35.9, adult: Secondary | ICD-10-CM | POA: Diagnosis not present

## 2017-04-30 DIAGNOSIS — E7800 Pure hypercholesterolemia, unspecified: Secondary | ICD-10-CM | POA: Diagnosis not present

## 2017-04-30 DIAGNOSIS — I25118 Atherosclerotic heart disease of native coronary artery with other forms of angina pectoris: Secondary | ICD-10-CM | POA: Diagnosis not present

## 2017-04-30 NOTE — Patient Instructions (Signed)
Dr Sallyanne Kuster has recommended making the following medication changes: 1. INCREASE Rosuvastatin to 10 mg (1 tablet) twice weekly  Your physician recommends that you schedule a follow-up appointment in 12 months. You will receive a reminder letter in the mail two months in advance. If you don't receive a letter, please call our office to schedule the follow-up appointment.  If you need a refill on your cardiac medications before your next appointment, please call your pharmacy.

## 2017-04-30 NOTE — Progress Notes (Signed)
Patient ID: [name], female   DOB: 01/10/1941, 76 y.o.   MRN: [id number]    Cardiology Office Note    Date:  04/30/2017   ID:  [name], DOB June 07, 1941, MRN [id number]  PCP:  [name], MD  Cardiologist:    Sanda Klein, MD   No chief complaint on file.   History of Present Illness:  [name] is a 76 y.o. female with a history of moderate coronary artery disease, hypertension, hyperlipidemia, severe obesity, reactive airway disease and treated hypothyroidism returning for follow-up.   As before she has occasional episodes of pressure, "indigestion"-like discomfort in her mid left chest. This is not clearly associated with physical activity. She believes is related to certain foods, but it does not have a clear association with meals. It is different from her usual reflux pain. She denies exertional chest discomfort but does have NYHA functional class II-III exertional dyspnea. Back pain, right hip pain and left knee pain limit her activity more than dyspnea does. She denies more than trivial ankle edema. She has occasional palpitations if she becomes upset or is in pain. She has "allergies" in the spring and has occasional wheezing for which she takes inhalers.  She is on  Zetia and only taking Crestor 10 mg half tablet twice a week and frequently forgets to take one of the weekly doses. Her last LDL cholesterol was markedly elevated up from 99 to 158. She has not tolerated daily statins due to muscle cramps.  She had an intermediate severity stenosis of the mid LAD artery. This was demonstrated by coronary angiography in 2011, but shown not to be hemodynamically important by pressure wire analysis. She had some problems with intrascapular pressure in 2013 and had a nuclear stress test that showed normal myocardial perfusion. Has normal left ventricular systolic function.  Past Medical History:  Diagnosis Date  . Arthritis    osteo - s/p right total knee  . Asthma    daily and  prn inhalers  . Boil, axilla   . Breast cancer ([place]) 10/28/11   right breast  . CAD (coronary artery disease)   . Diabetes mellitus    diet-controlled  . GERD (gastroesophageal reflux disease)   . Grave's disease    no current meds.  . History of chemotherapy    neoadjuvant: 5 cycles of Taxol/carboplatinum:  last dose 02/24/12  . Hyperlipidemia   . Hypertension    under control with med.  . Hypothyroid   . Personal history of chemotherapy   . Personal history of radiation therapy   . S/P chemotherapy, time since 4-12 weeks finished 02/20/2011  . S/P radiation therapy 05/03/12 - 07/11/12   Right Breast High Axilla and Supraclavicular gegion: 4500 cGy/25 Fractions with boost for a total of 6300 cGy  . Sleep apnea sleep study 09/18/2005   uses CPAP occ. - to bring machine DOS  . Spondylosis    cervical and lumbar  . Use of letrozole (Femara) start 07/21/12    Past Surgical History:  Procedure Laterality Date  . ABDOMINAL HYSTERECTOMY    . BILATERAL OOPHORECTOMY  2009  . BREAST BIOPSY Bilateral 1998  . BREAST LUMPECTOMY Right 03/2012  . BREAST SURGERY    . CARDIAC CATHETERIZATION  04/10/2010, 05/07/2004   LAD lesion, blood flow OK, per pt.  . CARPAL TUNNEL RELEASE     left  . CATARACT EXTRACTION, BILATERAL    . COLONOSCOPY  12/20/2012   Procedure: COLONOSCOPY;  Surgeon: Sydell Axon  Andris Baumann, MD;  Location: [facility];  Service: Endoscopy;  Laterality: N/A;  . ESOPHAGOGASTRODUODENOSCOPY  12/20/2012   Procedure: ESOPHAGOGASTRODUODENOSCOPY (EGD);  Surgeon: Lafayette Dragon, MD;  Location: [facility];  Service: Endoscopy;  Laterality: N/A;  . JOINT REPLACEMENT  2006   RTK  . LESION REMOVAL  11/27/2011   Procedure: LESION REMOVAL;  Surgeon: Adin Hector, MD;  Location: [facility];  Service: General;  Laterality: Right;  Skin tag removed from under right eye. No specimen  . LUMBAR LAMINECTOMY     back surg. x 2 - 1980's and 1999  . MASTECTOMY PARTIAL / LUMPECTOMY  04/01/12    right breast, inv ductal ca, ER/PR  +, HER 2 -   . NM MYOVIEW LTD  08/02/2012   no ischemia  . PARTIAL HYSTERECTOMY     BSO  . PORTACATH PLACEMENT  11/27/2011   Procedure: INSERTION PORT-A-CATH;  Surgeon: Adin Hector, MD;  Location: [facility];  Service: General;  Laterality: Right;  . TOTAL KNEE ARTHROPLASTY  07/28/2006   right  . US ECHOCARDIOGRAPHY  08/14/2008   technically difficult-mild LVH, EF =>55%,mild annular ca+,AOV mildly sclerotic    Current Medications: Outpatient Medications Prior to Visit  Medication Sig Dispense Refill  . albuterol (PROVENTIL HFA;VENTOLIN HFA) 108 (90 BASE) MCG/ACT inhaler Inhale 2 puffs into the lungs every 4 (four) hours as needed. For shortnes of breath    . aspirin 81 MG tablet Take 81 mg by mouth daily.     . Cholecalciferol ([employer]S VIT D 5000 HIGH-POTENCY) 5000 units capsule Take 5,000 Units by mouth daily.    Marland Kitchen diltiazem (TIAZAC) 240 MG 24 hr capsule Take 240 mg by mouth every morning.     . Fluticasone-Salmeterol (ADVAIR) 100-50 MCG/DOSE AEPB Inhale 1 puff into the lungs every 12 (twelve) hours.     . folic acid (FOLVITE) 1 MG tablet Take 1 mg by mouth daily.    Marland Kitchen letrozole (FEMARA) 2.5 MG tablet TAKE 1 TABLET BY MOUTH DAILY 90 tablet 3  . levothyroxine (SYNTHROID, LEVOTHROID) 50 MCG tablet Take 50 mcg by mouth daily.  2  . meloxicam (MOBIC) 7.5 MG tablet Take 5 mg by mouth as needed. For knee tightness    . methotrexate 25 MG/ML injection   2  . ranitidine (ZANTAC) 150 MG tablet Take 150 mg by mouth at bedtime.     . rosuvastatin (CRESTOR) 10 MG tablet Take 10 mg by mouth 2 (two) times a week.  10  . triamterene-hydrochlorothiazide (MAXZIDE-25) 37.5-25 MG per tablet Take 1 tablet by mouth every morning. 1/2 tab in the AM     No facility-administered medications prior to visit.      Allergies:   Crestor [rosuvastatin]; Lactose intolerance (gi); Lipitor [atorvastatin calcium]; and Zocor [simvastatin - high dose]   Social History     Social History  . Marital status: Single    Spouse name: N/A  . Number of children: N/A  . Years of education: N/A   Occupational History  . retired Retired   Social History Main Topics  . Smoking status: Never Smoker  . Smokeless tobacco: Never Used  . Alcohol use No  . Drug use: No  . Sexual activity: Not Currently   Other Topics Concern  . 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   ROS:   Please see the history of present illness.    ROS All other systems reviewed and are negative.   PHYSICAL EXAM:   VS:  BP 120/72   Pulse 73   Ht 5\' 5"  (1.651 m)   Wt 104.7 kg (230 lb 12.8 oz)   BMI 38.41 kg/m    GEN: Well nourished, well developed, in no acute distress  HEENT: normal  Neck: no JVD, carotid bruits, or masses Cardiac: RRR; no murmurs, rubs, or gallops, symmetrical 1+ ankle edema  Respiratory:  clear to auscultation bilaterally, normal work of breathing GI: soft, nontender, nondistended, + BS MS: no deformity or atrophy  Skin: warm and dry, no rash Neuro:  Alert and Oriented x 3, Strength and sensation are intact Psych: euthymic mood, full affect  Wt Readings from Last 3 Encounters:  04/30/17 104.7 kg (230 lb 12.8 oz)  07/24/16 104.8 kg (231 lb 1.6 oz)  03/20/16 106.8 kg (235 lb 6.4 oz)      Studies/Labs Reviewed:   EKG:  EKG is ordered today.  The ekg ordered today demonstrates Normal sinus rhythmWith frequent PACs and one atrial couplet, minor ST-T-T-segment changes in leads 1 and aVL, no change in repolarization abnormalities from previous tracings  Recent Labs: Performed just a week ago at [facility] Hemoglobin A1c 5.9%, normal LFTs, cholesterol 237, HDL 59, LDL 158, triglycerides 100, creatinine 0.9, normal thyroid  tests   ASSESSMENT:    1. Coronary artery disease involving native coronary artery of native heart with other form of angina pectoris ([place])   2. Essential hypertension   3. Pure hypercholesterolemia   4. Severe obesity (BMI 35.0-35.9 with comorbidity) ([place])      PLAN:  In order of problems listed above:  1. CAD: No clear evidence of coronary disease related symptoms. Her "indigestion" maybe just that and her symptoms do not appear to be associated physical activity. If she does develop symptoms more suggestive of angina pectoris would proceed directly to angiography, since her body habitus would make nuclear stress testing difficult to interpret.  2. HLP: She is only taking Crestor 5 mg twice weekly and often less than that. Asked her to make sure she is taking it twice weekly and increase to a full 10 mg tablet each time. Has an appointment with her primary care provider next week and will continue getting lab tests through their office. Continue Zetia 3. HTN: Blood pressure well controlled today. She had a history of orthostatic hypotension when she was on higher doses of diuretics in the past; she uses beta agonist inhalers so I would avoid beta blockers.   4. Obesity: She is approaching morbid obesity range. I'm sure this is contributing to her dyspnea and leg edema. Weight loss strongly encouraged.  5. DJD: She is considering right hip replacement in the near future. At this point I don't think we need to perform additional cardiac workup before such a surgical procedure.    Medication Adjustments/Labs and Tests Ordered: Current medicines are reviewed at length with the patient today.  Concerns regarding medicines are outlined above.  Medication changes, Labs and Tests ordered today are listed in the Patient Instructions below. Patient Instructions  Dr Sallyanne Kuster has recommended making the following medication changes: 1. INCREASE Rosuvastatin to 10 mg (1 tablet) twice weekly  Your  physician recommends that you schedule a follow-up appointment in 12 months. You will  receive a reminder letter in the mail two months in advance. If you don't receive a letter, please call our office to schedule the follow-up appointment.  If you need a refill on your cardiac medications before your next appointment, please call your pharmacy.    Signed, Sanda Klein, MD  04/30/2017 8:49 AM    [facility] [address], [place], [place]  [address] Phone: [phone]; Fax: [phone]

## 2017-05-11 DIAGNOSIS — E784 Other hyperlipidemia: Secondary | ICD-10-CM | POA: Diagnosis not present

## 2017-05-11 DIAGNOSIS — I2510 Atherosclerotic heart disease of native coronary artery without angina pectoris: Secondary | ICD-10-CM | POA: Diagnosis not present

## 2017-05-11 DIAGNOSIS — E119 Type 2 diabetes mellitus without complications: Secondary | ICD-10-CM | POA: Diagnosis not present

## 2017-05-11 DIAGNOSIS — H6121 Impacted cerumen, right ear: Secondary | ICD-10-CM | POA: Diagnosis not present

## 2017-05-11 DIAGNOSIS — R1031 Right lower quadrant pain: Secondary | ICD-10-CM | POA: Diagnosis not present

## 2017-05-11 DIAGNOSIS — M542 Cervicalgia: Secondary | ICD-10-CM | POA: Diagnosis not present

## 2017-05-11 DIAGNOSIS — J45998 Other asthma: Secondary | ICD-10-CM | POA: Diagnosis not present

## 2017-05-11 DIAGNOSIS — Z0000 Encounter for general adult medical examination without abnormal findings: Secondary | ICD-10-CM | POA: Diagnosis not present

## 2017-05-11 DIAGNOSIS — C50919 Malignant neoplasm of unspecified site of unspecified female breast: Secondary | ICD-10-CM | POA: Diagnosis not present

## 2017-05-11 DIAGNOSIS — M069 Rheumatoid arthritis, unspecified: Secondary | ICD-10-CM | POA: Diagnosis not present

## 2017-05-11 DIAGNOSIS — Z6838 Body mass index (BMI) 38.0-38.9, adult: Secondary | ICD-10-CM | POA: Diagnosis not present

## 2017-05-11 DIAGNOSIS — Z1389 Encounter for screening for other disorder: Secondary | ICD-10-CM | POA: Diagnosis not present

## 2017-06-30 DIAGNOSIS — M0609 Rheumatoid arthritis without rheumatoid factor, multiple sites: Secondary | ICD-10-CM | POA: Diagnosis not present

## 2017-06-30 DIAGNOSIS — M25559 Pain in unspecified hip: Secondary | ICD-10-CM | POA: Diagnosis not present

## 2017-06-30 DIAGNOSIS — Z79899 Other long term (current) drug therapy: Secondary | ICD-10-CM | POA: Diagnosis not present

## 2017-06-30 DIAGNOSIS — M25569 Pain in unspecified knee: Secondary | ICD-10-CM | POA: Diagnosis not present

## 2017-06-30 DIAGNOSIS — M549 Dorsalgia, unspecified: Secondary | ICD-10-CM | POA: Diagnosis not present

## 2017-07-13 DIAGNOSIS — Z780 Asymptomatic menopausal state: Secondary | ICD-10-CM | POA: Diagnosis not present

## 2017-07-23 ENCOUNTER — Encounter: Payer: Self-pay | Admitting: Hematology and Oncology

## 2017-07-23 ENCOUNTER — Ambulatory Visit (HOSPITAL_BASED_OUTPATIENT_CLINIC_OR_DEPARTMENT_OTHER): Payer: PPO | Admitting: Hematology and Oncology

## 2017-07-23 DIAGNOSIS — C50411 Malignant neoplasm of upper-outer quadrant of right female breast: Secondary | ICD-10-CM

## 2017-07-23 DIAGNOSIS — Z170 Estrogen receptor positive status [ER+]: Secondary | ICD-10-CM

## 2017-07-23 DIAGNOSIS — Z79811 Long term (current) use of aromatase inhibitors: Secondary | ICD-10-CM

## 2017-07-23 MED ORDER — LETROZOLE 2.5 MG PO TABS: 2.5000 mg | ORAL_TABLET | Freq: Every day | ORAL | 3 refills | Status: DC

## 2017-07-23 MED ORDER — TRAMADOL HCL 50 MG PO TABS: 50.0000 mg | ORAL_TABLET | Freq: Two times a day (BID) | ORAL | Status: DC | PRN

## 2017-07-23 NOTE — Assessment & Plan Note (Signed)
Right breast invasive ductal carcinoma with DCIS 2.5 cm with the MRI showing a maximum diameter of 7.5 cm ER/PR positive HER-2 negative Ki-67 15% treated with neoadjuvant chemotherapy with Taxotere and Cytoxan 12/02/2011 4 followed by surgery with lumpectomy which showed IDC with lymphovascular invasion 1.9 cm, 2 sentinel lymph nodes negative, followed by radiation therapy and now on antiestrogen therapy with letrozole 2.5 mg daily from 07/21/2012  Letrozole toxicities: Occasional hot flashes Patient will finish 5 years of treatment by August 2018. I recommended obtaining breast cancer index to determine if she would benefit from extended adjuvant therapy. I will call her with the results of breast cancer index.  Breast cancer surveillance: 1. Breast exam 07/23/2017 normal 2. Mammogram 03/09/2017  normal, breast density category B  Return to clinic in 1 yr

## 2017-07-23 NOTE — Progress Notes (Signed)
Patient Care Team: [name], MD as PCP - General (Internal Medicine)  DIAGNOSIS:  Encounter Diagnosis  Name Primary?  . Malignant neoplasm of upper-outer quadrant of right breast in female, estrogen receptor positive ([place])    CHIEF COMPLIANT: Follow-up on letrozole therapy  INTERVAL HISTORY: [name] is a 76 year old with above-mentioned history of right breast cancer who completed 5 years of antiestrogen therapy and is here today to discuss extended adjuvant therapy options. Patient reports that the arthritis is bothering her and is going to require a hip replacement surgery. She denies any lumps or nodules in the breasts.  REVIEW OF SYSTEMS:   Constitutional: Denies fevers, chills or abnormal weight loss Eyes: Denies blurriness of vision Ears, nose, mouth, throat, and face: Denies mucositis or sore throat Respiratory: Denies cough, dyspnea or wheezes Cardiovascular: Denies palpitation, chest discomfort Gastrointestinal:  Denies nausea, heartburn or change in bowel habits Skin: Denies abnormal skin rashes Lymphatics: Denies new lymphadenopathy or easy bruising Neurological:Denies numbness, tingling or new weaknesses Behavioral/Psych: Mood is stable, no new changes  Extremities: No lower extremity edema Breast:  denies any pain or lumps or nodules in either breasts All other systems were reviewed with the patient and are negative.  I have reviewed the past medical history, past surgical history, social history and family history with the patient and they are unchanged from previous note.  ALLERGIES:  is allergic to crestor [rosuvastatin]; lactose intolerance (gi); lipitor [atorvastatin calcium]; and zocor [simvastatin - high dose].  MEDICATIONS:  Current Outpatient Prescriptions  Medication Sig Dispense Refill  . albuterol (PROVENTIL HFA;VENTOLIN HFA) 108 (90 BASE) MCG/ACT inhaler Inhale 2 puffs into the lungs every 4 (four) hours as needed. For shortnes of breath    .  aspirin 81 MG tablet Take 81 mg by mouth daily.     . Cholecalciferol ([employer] VIT D 5000 HIGH-POTENCY) 5000 units capsule Take 5,000 Units by mouth daily.    Marland Kitchen diltiazem (TIAZAC) 240 MG 24 hr capsule Take 240 mg by mouth every morning.     . ezetimibe (ZETIA) 10 MG tablet Take 1 tablet by mouth daily.    . Fluticasone-Salmeterol (ADVAIR) 100-50 MCG/DOSE AEPB Inhale 1 puff into the lungs every 12 (twelve) hours.     . folic acid (FOLVITE) 1 MG tablet Take 1 mg by mouth daily.    Marland Kitchen letrozole (FEMARA) 2.5 MG tablet TAKE 1 TABLET BY MOUTH DAILY 90 tablet 3  . levothyroxine (SYNTHROID, LEVOTHROID) 50 MCG tablet Take 50 mcg by mouth daily.  2  . meloxicam (MOBIC) 7.5 MG tablet Take 5 mg by mouth as needed. For knee tightness    . methotrexate 25 MG/ML injection   2  . ranitidine (ZANTAC) 150 MG tablet Take 150 mg by mouth at bedtime.     . rosuvastatin (CRESTOR) 10 MG tablet Take 10 mg by mouth 2 (two) times a week.  10  . triamterene-hydrochlorothiazide (MAXZIDE-25) 37.5-25 MG per tablet Take 1 tablet by mouth every morning. 1/2 tab in the AM     No current facility-administered medications for this visit.     PHYSICAL EXAMINATION: ECOG PERFORMANCE STATUS: 1 - Symptomatic but completely ambulatory  Vitals:   07/23/17 1036  BP: (!) 153/81  Pulse: 65  Resp: 18  Temp: 98.7 F (37.1 C)   Filed Weights   07/23/17 1036  Weight: 225 lb 1.6 oz (102.1 kg)    GENERAL:alert, no distress and comfortable SKIN: skin color, texture, turgor are normal, no rashes or significant  lesions EYES: normal, Conjunctiva are pink and non-injected, sclera clear OROPHARYNX:no exudate, no erythema and lips, buccal mucosa, and tongue normal  NECK: supple, thyroid normal size, non-tender, without nodularity LYMPH:  no palpable lymphadenopathy in the cervical, axillary or inguinal LUNGS: clear to auscultation and percussion with normal breathing effort HEART: regular rate & rhythm and no murmurs and no lower  extremity edema ABDOMEN:abdomen soft, non-tender and normal bowel sounds MUSCULOSKELETAL:no cyanosis of digits and no clubbing  NEURO: alert & oriented x 3 with fluent speech, no focal motor/sensory deficits EXTREMITIES: No lower extremity edema BREAST: No palpable masses or nodules in either right or left breasts. No palpable axillary supraclavicular or infraclavicular adenopathy no breast tenderness or nipple discharge. (exam performed in the presence of a chaperone)  LABORATORY DATA:  I have reviewed the data as listed   Chemistry      Component Value Date/Time   NA 141 02/22/2015 0935   NA 142 12/17/2014 1039   K 3.8 02/22/2015 0935   K 3.5 12/17/2014 1039   CL 101 02/22/2015 0935   CL 101 04/24/2013 0904   CO2 31 02/22/2015 0935   CO2 31 (H) 12/17/2014 1039   BUN 15 02/22/2015 0935   BUN 18.3 12/17/2014 1039   CREATININE 0.98 02/22/2015 0935   CREATININE 0.9 12/17/2014 1039      Component Value Date/Time   CALCIUM 10.2 02/22/2015 0935   CALCIUM 10.2 12/17/2014 1039   ALKPHOS 71 02/22/2015 0935   ALKPHOS 77 12/17/2014 1039   AST 18 02/22/2015 0935   AST 16 12/17/2014 1039   ALT 19 02/22/2015 0935   ALT 20 12/17/2014 1039   BILITOT 0.5 02/22/2015 0935   BILITOT 0.37 12/17/2014 1039       Lab Results  Component Value Date   WBC 4.2 12/17/2014   HGB 12.2 12/17/2014   HCT 37.3 12/17/2014   MCV 93.1 12/17/2014   PLT 290 12/17/2014   NEUTROABS 2.5 12/17/2014    ASSESSMENT & PLAN:  Breast cancer of upper-outer quadrant of right female breast ([place]) Right breast invasive ductal carcinoma with DCIS 2.5 cm with the MRI showing a maximum diameter of 7.5 cm ER/PR positive HER-2 negative Ki-67 15% treated with neoadjuvant chemotherapy with Taxotere and Cytoxan 12/02/2011 4 followed by surgery with lumpectomy which showed IDC with lymphovascular invasion 1.9 cm, 2 sentinel lymph nodes negative, followed by radiation therapy and now on antiestrogen therapy with letrozole 2.5  mg daily from 07/21/2012  Letrozole toxicities: Occasional hot flashes Patient will finish 5 years of treatment by August 2018. I discussed with the patient about pros and cons of them extended adjuvant therapy. Patient is willing to continue with antiestrogen therapy for 2 more years.  Breast cancer surveillance: 1. Breast exam 07/23/2017 normal 2. Mammogram 03/09/2017  normal, breast density category B  Return to clinic in 1 yr  I spent 15 minutes talking to the patient of which more than half was spent in counseling and coordination of care.  No orders of the defined types were placed in this encounter.  The patient has a good understanding of the overall plan. she agrees with it. she will call with any problems that may develop before the next visit here.   Rulon Eisenmenger, MD 07/23/17

## 2017-09-30 DIAGNOSIS — M549 Dorsalgia, unspecified: Secondary | ICD-10-CM | POA: Diagnosis not present

## 2017-09-30 DIAGNOSIS — M0609 Rheumatoid arthritis without rheumatoid factor, multiple sites: Secondary | ICD-10-CM | POA: Diagnosis not present

## 2017-09-30 DIAGNOSIS — M25569 Pain in unspecified knee: Secondary | ICD-10-CM | POA: Diagnosis not present

## 2017-09-30 DIAGNOSIS — M25559 Pain in unspecified hip: Secondary | ICD-10-CM | POA: Diagnosis not present

## 2017-09-30 DIAGNOSIS — Z79899 Other long term (current) drug therapy: Secondary | ICD-10-CM | POA: Diagnosis not present

## 2017-11-08 DIAGNOSIS — E7849 Other hyperlipidemia: Secondary | ICD-10-CM | POA: Diagnosis not present

## 2017-11-08 DIAGNOSIS — E038 Other specified hypothyroidism: Secondary | ICD-10-CM | POA: Diagnosis not present

## 2017-11-08 DIAGNOSIS — I2510 Atherosclerotic heart disease of native coronary artery without angina pectoris: Secondary | ICD-10-CM | POA: Diagnosis not present

## 2017-11-08 DIAGNOSIS — Z6837 Body mass index (BMI) 37.0-37.9, adult: Secondary | ICD-10-CM | POA: Diagnosis not present

## 2017-11-08 DIAGNOSIS — I10 Essential (primary) hypertension: Secondary | ICD-10-CM | POA: Diagnosis not present

## 2017-11-08 DIAGNOSIS — Z23 Encounter for immunization: Secondary | ICD-10-CM | POA: Diagnosis not present

## 2017-11-08 DIAGNOSIS — J45998 Other asthma: Secondary | ICD-10-CM | POA: Diagnosis not present

## 2017-11-08 DIAGNOSIS — M0689 Other specified rheumatoid arthritis, multiple sites: Secondary | ICD-10-CM | POA: Diagnosis not present

## 2017-11-08 DIAGNOSIS — C50919 Malignant neoplasm of unspecified site of unspecified female breast: Secondary | ICD-10-CM | POA: Diagnosis not present

## 2017-11-08 DIAGNOSIS — E119 Type 2 diabetes mellitus without complications: Secondary | ICD-10-CM | POA: Diagnosis not present

## 2017-12-24 ENCOUNTER — Encounter: Payer: Self-pay | Admitting: Gastroenterology

## 2018-01-04 DIAGNOSIS — M549 Dorsalgia, unspecified: Secondary | ICD-10-CM | POA: Diagnosis not present

## 2018-01-04 DIAGNOSIS — M25559 Pain in unspecified hip: Secondary | ICD-10-CM | POA: Diagnosis not present

## 2018-01-04 DIAGNOSIS — M25569 Pain in unspecified knee: Secondary | ICD-10-CM | POA: Diagnosis not present

## 2018-01-04 DIAGNOSIS — M0609 Rheumatoid arthritis without rheumatoid factor, multiple sites: Secondary | ICD-10-CM | POA: Diagnosis not present

## 2018-01-04 DIAGNOSIS — Z79899 Other long term (current) drug therapy: Secondary | ICD-10-CM | POA: Diagnosis not present

## 2018-01-04 DIAGNOSIS — M0509 Felty's syndrome, multiple sites: Secondary | ICD-10-CM | POA: Diagnosis not present

## 2018-01-27 DIAGNOSIS — M1611 Unilateral primary osteoarthritis, right hip: Secondary | ICD-10-CM | POA: Diagnosis not present

## 2018-01-27 DIAGNOSIS — M1612 Unilateral primary osteoarthritis, left hip: Secondary | ICD-10-CM | POA: Diagnosis not present

## 2018-01-27 DIAGNOSIS — Z7689 Persons encountering health services in other specified circumstances: Secondary | ICD-10-CM | POA: Diagnosis not present

## 2018-02-04 ENCOUNTER — Telehealth: Payer: Self-pay | Admitting: *Deleted

## 2018-02-04 DIAGNOSIS — M1611 Unilateral primary osteoarthritis, right hip: Secondary | ICD-10-CM | POA: Diagnosis not present

## 2018-02-04 DIAGNOSIS — M25551 Pain in right hip: Secondary | ICD-10-CM | POA: Diagnosis not present

## 2018-02-04 NOTE — Telephone Encounter (Signed)
Appt scheduled for Tuesday, 02/08/18 with Dr Jory Sims, NP.  Faxed to Dr Sid Falcon office via Epic to update.

## 2018-02-04 NOTE — Telephone Encounter (Signed)
   Primary Cardiologist:Mihai Croitoru, MD  Chart reviewed as part of pre-operative protocol coverage. Because of [name]'s past medical history and time since last visit, he/she will require a follow-up visit in order to better assess preoperative cardiovascular risk.  Pre-op covering staff: - Please schedule appointment and call patient to inform them. - Please contact requesting surgeon's office via preferred method (i.e, phone, fax) to inform them of need for appointment prior to surgery.  Rosaria Ferries, PA-C  02/04/2018, 4:45 PM

## 2018-02-04 NOTE — Telephone Encounter (Signed)
   [facility] Pre-operative Risk Assessment    Request for surgical clearance:  1. What type of surgery is being performed? RIGHT THA-AA   2. When is this surgery scheduled? PENDING   3. What type of clearance is required (medical clearance vs. Pharmacy clearance to hold med vs. Both)? MEDICAL  4. Are there any medications that need to be held prior to surgery and how long?NONE   5. Practice name and name of physician performing surgery? [facility]   6. What is your office phone and fax number? [facility] PH=[phone]  FAX=[phone]   7. Anesthesia type (None, local, MAC, general) ? SPINAL   Fredia Beets 02/04/2018, 11:22 AM  _________________________________________________________________   (provider comments below)

## 2018-02-06 NOTE — Progress Notes (Signed)
Cardiology Office Note   Date:  02/08/2018   ID:  [name], DOB May 17, 1941, MRN [id number]  PCP:  Jasmine Infante, MD  Cardiologist: Dr. Sallyanne Kuster  Chief Complaint  Patient presents with  . Medical Clearance    Right hip replacement surgery  . Chest Pain     History of Present Illness: [name] is a 77 y.o. female who presents for ongoing assessment and management of coronary artery disease, history of hyperlipidemia, hypertension, reactive airway disease, hypothyroidism, and severe obesity.  The patient continues to have episodes of chest pressure "indigestion-like discomfort" in her substernal area.  It is not clearly associated with any physical activity or exertion.  She was noted to have NYHA functional class II-III exertional dyspnea.  She was also noted to have intermediate severity stenosis of her mid LAD demonstrated by cardiac catheterization in 2011 but not hemodynamically important by pressure wire analysis.  She was last seen by Dr. Sallyanne Kuster on 04/30/2017.  On that last office visit, the patient was not scheduled for any new testing.  If her symptoms became worse or more suggestive of angina she would be scheduled for cardiac catheterization, as obesity would be a nuclear medicine stress testing difficult to interpret.  She was continued to have education on weight loss and increase activity as she was approaching morbid obesity.  She was considering a right hip replacement in the near future and did not require any further cardiac testing before this procedure.  She comes today for preoperative evaluation prior to right hip surgery. This is planned after cardiac clearance with surgery to be completed by Dr. Lyla Glassing, [place].   Since it had been over 6 months she requires office visit. She continues to have intermittent chest pain she believes is related to GERD, usually occurs the day after he had a meal and not right after a meal. She continues to have dyspnea on  exertion. He is often very active due to right hip pain. She has lost 7 pounds being seen by Dr. Sallyanne Kuster.   Past Medical History:  Diagnosis Date  . Arthritis    osteo - s/p right total knee  . Asthma    daily and prn inhalers  . Boil, axilla   . Breast cancer ([place]) 10/28/11   right breast  . CAD (coronary artery disease)   . Diabetes mellitus    diet-controlled  . GERD (gastroesophageal reflux disease)   . Grave's disease    no current meds.  . History of chemotherapy    neoadjuvant: 5 cycles of Taxol/carboplatinum:  last dose 02/24/12  . Hyperlipidemia   . Hypertension    under control with med.  . Hypothyroid   . Personal history of chemotherapy   . Personal history of radiation therapy   . S/P chemotherapy, time since 4-12 weeks finished 02/20/2011  . S/P radiation therapy 05/03/12 - 07/11/12   Right Breast High Axilla and Supraclavicular gegion: 4500 cGy/25 Fractions with boost for a total of 6300 cGy  . Sleep apnea sleep study 09/18/2005   uses CPAP occ. - to bring machine DOS  . Spondylosis    cervical and lumbar  . Use of letrozole (Femara) start 07/21/12    Past Surgical History:  Procedure Laterality Date  . ABDOMINAL HYSTERECTOMY    . BILATERAL OOPHORECTOMY  2009  . BREAST BIOPSY Bilateral 1998  . BREAST LUMPECTOMY Right 03/2012  . BREAST SURGERY    . CARDIAC CATHETERIZATION  04/10/2010, 05/07/2004   LAD  lesion, blood flow OK, per pt.  . CARPAL TUNNEL RELEASE     left  . CATARACT EXTRACTION, BILATERAL    . COLONOSCOPY  12/20/2012   Procedure: COLONOSCOPY;  Surgeon: Lafayette Dragon, MD;  Location: [facility];  Service: Endoscopy;  Laterality: N/A;  . ESOPHAGOGASTRODUODENOSCOPY  12/20/2012   Procedure: ESOPHAGOGASTRODUODENOSCOPY (EGD);  Surgeon: Lafayette Dragon, MD;  Location: [facility];  Service: Endoscopy;  Laterality: N/A;  . JOINT REPLACEMENT  2006   RTK  . LESION REMOVAL  11/27/2011   Procedure: LESION REMOVAL;  Surgeon: Adin Hector, MD;  Location: [facility];  Service: General;  Laterality: Right;  Skin tag removed from under right eye. No specimen  . LUMBAR LAMINECTOMY     back surg. x 2 - 1980's and 1999  . MASTECTOMY PARTIAL / LUMPECTOMY  04/01/12   right breast, inv ductal ca, ER/PR  +, HER 2 -   . NM MYOVIEW LTD  08/02/2012   no ischemia  . PARTIAL HYSTERECTOMY     BSO  . PORTACATH PLACEMENT  11/27/2011   Procedure: INSERTION PORT-A-CATH;  Surgeon: Adin Hector, MD;  Location: [facility];  Service: General;  Laterality: Right;  . TOTAL KNEE ARTHROPLASTY  07/28/2006   right  . US ECHOCARDIOGRAPHY  08/14/2008   technically difficult-mild LVH, EF =>55%,mild annular ca+,AOV mildly sclerotic     Current Outpatient Medications  Medication Sig Dispense Refill  . albuterol (PROVENTIL HFA;VENTOLIN HFA) 108 (90 BASE) MCG/ACT inhaler Inhale 2 puffs into the lungs every 4 (four) hours as needed. For shortnes of breath    . aspirin 81 MG tablet Take 81 mg by mouth daily.     . Cholecalciferol ([employer]S VIT D 5000 HIGH-POTENCY) 5000 units capsule Take 5,000 Units by mouth daily.    Marland Kitchen diltiazem (TIAZAC) 240 MG 24 hr capsule Take 240 mg by mouth every morning.     . ezetimibe (ZETIA) 10 MG tablet Take 1 tablet by mouth daily.    . Fluticasone-Salmeterol (ADVAIR) 100-50 MCG/DOSE AEPB Inhale 1 puff into the lungs every 12 (twelve) hours.     . folic acid (FOLVITE) 1 MG tablet Take 1 mg by mouth daily.    Marland Kitchen letrozole (FEMARA) 2.5 MG tablet Take 1 tablet (2.5 mg total) by mouth daily. 90 tablet 3  . levothyroxine (SYNTHROID, LEVOTHROID) 75 MCG tablet Take 75 mcg by mouth daily.   2  . methotrexate 25 MG/ML injection   2  . ranitidine (ZANTAC) 150 MG tablet Take 150 mg by mouth at bedtime.     . rosuvastatin (CRESTOR) 10 MG tablet Take 10 mg by mouth 2 (two) times a week.  10  . traMADol (ULTRAM) 50 MG tablet Take 1 tablet (50 mg total) by mouth every 12 (twelve) hours as needed. 30 tablet   . triamterene-hydrochlorothiazide  (MAXZIDE-25) 37.5-25 MG per tablet Take 1 tablet by mouth every morning. 1/2 tab in the AM    . meloxicam (MOBIC) 7.5 MG tablet Take 5 mg by mouth as needed. For knee tightness     No current facility-administered medications for this visit.     Allergies:   Crestor [rosuvastatin]; Lactose intolerance (gi); Lipitor [atorvastatin calcium]; and Zocor [simvastatin - high dose]    Social History:  The patient  reports that  has never smoked. she has never used smokeless tobacco. She reports that she does not drink alcohol or use drugs.   Family History:  The patient's family history  includes Anesthesia problems in her cousin; Cancer in her cousin, cousin, cousin, and maternal grandfather.    ROS: All other systems are reviewed and negative. Unless otherwise mentioned in H&P    PHYSICAL EXAM: VS:  BP (!) 142/66   Pulse 85   Ht 5\' 4"  (1.626 m)   Wt 223 lb (101.2 kg)   BMI 38.28 kg/m  , BMI Body mass index is 38.28 kg/m. GEN: Well nourished, well developed, in no acute distress  HEENT: normal  Neck: no JVD, carotid bruits, or masses Cardiac:  RRR; no murmurs, rubs, or gallops,no edema  Respiratory:  clear to auscultation bilaterally, normal work of breathing GI: soft, nontender, nondistended, + BS MS: no deformity or atrophy  Skin: warm and dry, no rash Neuro:  Strength and sensation are intact Psych: euthymic mood, full affect   EKG:  Normal sinus rhythm, LVH, lateral T-wave abnormality anteriorly and laterally. T-wave inversion in lead one is new nonspecific T-wave abnormality are not new and similar to prior EKG 04/30/2017.  Recent Labs: No results found for requested labs within last 8760 hours.    Lipid Panel    Component Value Date/Time   CHOL 223 (H) 02/22/2015 0935   TRIG 92 02/22/2015 0935   HDL 64 02/22/2015 0935   CHOLHDL 3.5 02/22/2015 0935   VLDL 18 02/22/2015 0935   LDLCALC 141 (H) 02/22/2015 0935      Wt Readings from Last 3 Encounters:  02/08/18 223 lb  (101.2 kg)  07/23/17 225 lb 1.6 oz (102.1 kg)  04/30/17 230 lb 12.8 oz (104.7 kg)      Other studies Reviewed: NM Stress Test 03-20-2015 Impression Exercise Capacity:  Lexiscan with no exercise. BP Response:  Normal blood pressure response. Clinical Symptoms:  No significant symptoms noted. ECG Impression:  There is no significant ectopy. Comparison with Prior Nuclear Study: No significant change from previous study  Overall Impression:  Low risk stress nuclear study with small area of inferoapical attenuation artifact, worse at rest than stress.   ASSESSMENT AND PLAN:  1.  Preoperative cardiac risk evaluation: She continues to have intermittent chest discomfort usually the day after having a large meal radiation to her back. She has frequent burping associated with a meal and after, and sometimes following day.  I'm going to repeat her [place]. Dr. Sallyanne Kuster noted that she can be scheduled for catheterization if symptoms were progressive. Symptomsms have  not been progressive, there are changes on her EKG, and she is noticing  fullness in her chest and frequent burping with radiation to her back 24 hours after eating.   She has no change in her exercise stamina with the exception of being limited by right hip pain. I've asked her to wear a supportive bra. She will not be walking due to right hip pain. If her stress test remains low risk, she will be cleared for right hip surgery. If abnormal, will proceed with cardiac catheterization. This note is copy to Dr. Sallyanne Kuster. If after he reviews this noted and prefers that she have cardiac catheterization I will schedule this.  2. Hypertension: Slightly elevated today. In her primary care physician's office her blood pressure was running 136/70. I will continue current medication regimen which includes Triamterene /HCTZ 37.5/25 mg daily and Diltiazem 240 mg daily.  3. Obesity: She has lost 8 pounds since being seen last. She will like  to lose more and has been limited on her ambulation right hip pain. I have reinforced diabetic diet,  and low sodium diet.  4. History of hypothyroidism: Followed by PCP.  Current medicines are reviewed at length with the patient today.    Labs/ tests ordered today include: Lexiscan myoview.  Phill Myron. West Pugh, ANP, AACC   02/08/2018 5:37 PM    [facility] [address] [place], [place] [address] Phone: [phone]; Fax: [phone]

## 2018-02-08 ENCOUNTER — Encounter: Payer: Self-pay | Admitting: Adult Health

## 2018-02-08 ENCOUNTER — Ambulatory Visit (INDEPENDENT_AMBULATORY_CARE_PROVIDER_SITE_OTHER): Payer: PPO | Admitting: Adult Health

## 2018-02-08 VITALS — BP 142/66 | HR 85 | Ht 64.0 in | Wt 223.0 lb

## 2018-02-08 DIAGNOSIS — E7800 Pure hypercholesterolemia, unspecified: Secondary | ICD-10-CM | POA: Diagnosis not present

## 2018-02-08 DIAGNOSIS — I25118 Atherosclerotic heart disease of native coronary artery with other forms of angina pectoris: Secondary | ICD-10-CM | POA: Diagnosis not present

## 2018-02-08 DIAGNOSIS — R079 Chest pain, unspecified: Secondary | ICD-10-CM | POA: Diagnosis not present

## 2018-02-08 DIAGNOSIS — I10 Essential (primary) hypertension: Secondary | ICD-10-CM

## 2018-02-08 NOTE — Patient Instructions (Signed)
Medication Instructions:  No changes-Your physician recommends that you continue on your current medications as directed. Please refer to the Current Medication list given to you today.  If you need a refill on your cardiac medications before your next appointment, please call your pharmacy.  Testing/Procedures: Your physician has requested that you have a lexiscan myoview.A cardiac stress test is a cardiological test that measures the heart's ability to respond to external stress in a controlled clinical environment. The stress response is induced byintravenous pharmacological stimulation. For further information please visit [facility]uge[facility]iesta[facility]tn. Please follow instruction sheet, as given.  Follow-Up: Your physician wants you to follow-up in: AFTER TESTING WITH DR OMBTDHRC.  Thank you for choosing [facility] at [facility]!!

## 2018-02-09 ENCOUNTER — Telehealth (HOSPITAL_COMMUNITY): Payer: Self-pay

## 2018-02-09 NOTE — Telephone Encounter (Signed)
Encounter complete. 

## 2018-02-09 NOTE — Progress Notes (Signed)
Agree. Cardiac cath would be overkill for preop, but hopefully no attenuation artifact on nuke... MCr

## 2018-02-14 ENCOUNTER — Telehealth: Payer: Self-pay | Admitting: Adult Health

## 2018-02-14 NOTE — Telephone Encounter (Signed)
Spoke with pt, questions regarding nuclear testing tomorrow answered.

## 2018-02-14 NOTE — Telephone Encounter (Signed)
New Message  Pt says that she has questions about her lexi scan that she is having tomorrow 02/15/18. Please call

## 2018-02-15 ENCOUNTER — Ambulatory Visit (HOSPITAL_COMMUNITY)
Admission: RE | Admit: 2018-02-15 | Discharge: 2018-02-15 | Disposition: A | Discharge status: Home / Self Care | Payer: PPO | Source: Ambulatory Visit | Attending: Cardiology | Admitting: Cardiology

## 2018-02-15 DIAGNOSIS — R0609 Other forms of dyspnea: Secondary | ICD-10-CM | POA: Diagnosis not present

## 2018-02-15 DIAGNOSIS — I10 Essential (primary) hypertension: Secondary | ICD-10-CM | POA: Diagnosis not present

## 2018-02-15 DIAGNOSIS — R079 Chest pain, unspecified: Secondary | ICD-10-CM | POA: Insufficient documentation

## 2018-02-15 DIAGNOSIS — E0500 Thyrotoxicosis with diffuse goiter without thyrotoxic crisis or storm: Secondary | ICD-10-CM | POA: Insufficient documentation

## 2018-02-15 DIAGNOSIS — R5383 Other fatigue: Secondary | ICD-10-CM | POA: Insufficient documentation

## 2018-02-15 DIAGNOSIS — J45909 Unspecified asthma, uncomplicated: Secondary | ICD-10-CM | POA: Insufficient documentation

## 2018-02-15 DIAGNOSIS — I25118 Atherosclerotic heart disease of native coronary artery with other forms of angina pectoris: Secondary | ICD-10-CM | POA: Insufficient documentation

## 2018-02-15 DIAGNOSIS — E669 Obesity, unspecified: Secondary | ICD-10-CM | POA: Insufficient documentation

## 2018-02-15 DIAGNOSIS — G4733 Obstructive sleep apnea (adult) (pediatric): Secondary | ICD-10-CM | POA: Diagnosis not present

## 2018-02-15 DIAGNOSIS — Z6838 Body mass index (BMI) 38.0-38.9, adult: Secondary | ICD-10-CM | POA: Diagnosis not present

## 2018-02-15 DIAGNOSIS — E119 Type 2 diabetes mellitus without complications: Secondary | ICD-10-CM | POA: Insufficient documentation

## 2018-02-15 HISTORY — PX: CARDIOVASCULAR STRESS TEST: SHX262

## 2018-02-15 LAB — MYOCARDIAL PERFUSION IMAGING
CHL CUP NUCLEAR SDS: 0
CHL CUP RESTING HR STRESS: 65 {beats}/min
CSEPPHR: 103 {beats}/min
LV sys vol: 35 mL
LVDIAVOL: 88 mL (ref 46–106)
NUC STRESS TID: 1.28
SRS: 0
SSS: 0

## 2018-02-15 MED ORDER — REGADENOSON 0.4 MG/5ML IV SOLN
0.4000 mg | Freq: Once | INTRAVENOUS | Status: AC
  Administered 2018-02-15: 0.4 mg via INTRAVENOUS

## 2018-02-15 MED ORDER — TECHNETIUM TC 99M TETROFOSMIN IV KIT
32.5000 | PACK | Freq: Once | INTRAVENOUS | Status: AC | PRN
  Administered 2018-02-15: 32.5 via INTRAVENOUS
  Filled 2018-02-15: qty 33

## 2018-02-15 MED ORDER — TECHNETIUM TC 99M TETROFOSMIN IV KIT
10.9000 | PACK | Freq: Once | INTRAVENOUS | Status: AC | PRN
  Administered 2018-02-15: 10.9 via INTRAVENOUS
  Filled 2018-02-15: qty 11

## 2018-03-07 ENCOUNTER — Ambulatory Visit: Payer: Self-pay | Admitting: Orthopedic Surgery

## 2018-03-07 ENCOUNTER — Encounter (HOSPITAL_COMMUNITY): Payer: Self-pay

## 2018-03-07 NOTE — Patient Instructions (Addendum)
Your procedure is scheduled on: Thursday, March 17, 2018   Surgery Time:  12:30PM-2:30PM   Report to [facility] Main  Entrance    Report to admitting at 10:00 AM   Call this number if you have problems the morning of surgery [phone]   Bring CPAP mask and tubing day of surgery   Do not eat food or drink liquids :After Midnight.   Do NOT smoke after Midnight   Take these medicines the morning of surgery with A SIP OF WATER: Diltiazem, Letrozole, Levothyroxine, Ranitidine   Use Inhalers per normal routine              You may not have any metal on your body including hair pins, jewelry, and body piercings             Do not wear make-up, lotions, powders, perfumes/cologne, or deodorant             Do not wear nail polish.  Do not shave  48 hours prior to surgery.                Do not bring valuables to the hospital. [place].   Contacts, dentures or bridgework may not be worn into surgery.   Leave suitcase in the car. After surgery it may be brought to your room.   Special Instructions: Bring a copy of your healthcare power of attorney and living will documents         the day of surgery if you haven't scanned them in before.              Please read over the following fact sheets you were given:  [facility] - Preparing for Surgery Before surgery, you can play an important role.  Because skin is not sterile, your skin needs to be as free of germs as possible.  You can reduce the number of germs on your skin by washing with CHG (chlorahexidine gluconate) soap before surgery.  CHG is an antiseptic cleaner which kills germs and bonds with the skin to continue killing germs even after washing. Please DO NOT use if you have an allergy to CHG or antibacterial soaps.  If your skin becomes reddened/irritated stop using the CHG and inform your nurse when you arrive at Short Stay. Do not shave (including legs and  underarms) for at least 48 hours prior to the first CHG shower.  You may shave your face/neck.  Please follow these instructions carefully:  1.  Shower with CHG Soap the night before surgery and the  morning of surgery.  2.  If you choose to wash your hair, wash your hair first as usual with your normal  shampoo.  3.  After you shampoo, rinse your hair and body thoroughly to remove the shampoo.                             4.  Use CHG as you would any other liquid soap.  You can apply chg directly to the skin and wash.  Gently with a scrungie or clean washcloth.  5.  Apply the CHG Soap to your body ONLY FROM THE NECK DOWN.   Do   not use on face/ open  Wound or open sores. Avoid contact with eyes, ears mouth and   genitals (private parts).                       Wash face,  Genitals (private parts) with your normal soap.             6.  Wash thoroughly, paying special attention to the area where your    surgery  will be performed.  7.  Thoroughly rinse your body with warm water from the neck down.  8.  DO NOT shower/wash with your normal soap after using and rinsing off the CHG Soap.                9.  Pat yourself dry with a clean towel.            10.  Wear clean pajamas.            11.  Place clean sheets on your bed the night of your first shower and do not  sleep with pets. Day of Surgery : Do not apply any lotions/deodorants the morning of surgery.  Please wear clean clothes to the hospital/surgery center.  FAILURE TO FOLLOW THESE INSTRUCTIONS MAY RESULT IN THE CANCELLATION OF YOUR SURGERY  PATIENT SIGNATURE_________________________________  NURSE SIGNATURE__________________________________  ________________________________________________________________________   [name]  An incentive spirometer is a tool that can help keep your lungs clear and active. This tool measures how well you are filling your lungs with each breath. Taking long deep  breaths may help reverse or decrease the chance of developing breathing (pulmonary) problems (especially infection) following:  A long period of time when you are unable to move or be active. BEFORE THE PROCEDURE   If the spirometer includes an indicator to show your best effort, your nurse or respiratory therapist will set it to a desired goal.  If possible, sit up straight or lean slightly forward. Try not to slouch.  Hold the incentive spirometer in an upright position. INSTRUCTIONS FOR USE  1. Sit on the edge of your bed if possible, or sit up as far as you can in bed or on a chair. 2. Hold the incentive spirometer in an upright position. 3. Breathe out normally. 4. Place the mouthpiece in your mouth and seal your lips tightly around it. 5. Breathe in slowly and as deeply as possible, raising the piston or the ball toward the top of the column. 6. Hold your breath for 3-5 seconds or for as long as possible. Allow the piston or ball to fall to the bottom of the column. 7. Remove the mouthpiece from your mouth and breathe out normally. 8. Rest for a few seconds and repeat Steps 1 through 7 at least 10 times every 1-2 hours when you are awake. Take your time and take a few normal breaths between deep breaths. 9. The spirometer may include an indicator to show your best effort. Use the indicator as a goal to work toward during each repetition. 10. After each set of 10 deep breaths, practice coughing to be sure your lungs are clear. If you have an incision (the cut made at the time of surgery), support your incision when coughing by placing a pillow or rolled up towels firmly against it. Once you are able to get out of bed, walk around indoors and cough well. You may stop using the incentive spirometer when instructed by your caregiver.  RISKS AND COMPLICATIONS  Take your time  so you do not get dizzy or light-headed.  If you are in pain, you may need to take or ask for pain medication before  doing incentive spirometry. It is harder to take a deep breath if you are having pain. AFTER USE  Rest and breathe slowly and easily.  It can be helpful to keep track of a log of your progress. Your caregiver can provide you with a simple table to help with this. If you are using the spirometer at home, follow these instructions: [place] IF:   You are having difficultly using the spirometer.  You have trouble using the spirometer as often as instructed.  Your pain medication is not giving enough relief while using the spirometer.  You develop fever of 100.5 F (38.1 C) or higher. SEEK IMMEDIATE MEDICAL CARE IF:   You cough up bloody sputum that had not been present before.  You develop fever of 102 F (38.9 C) or greater.  You develop worsening pain at or near the incision site. MAKE SURE YOU:   Understand these instructions.  Will watch your condition.  Will get help right away if you are not doing well or get worse. Document Released: 04/19/2007 Document Revised: 02/29/2012 Document Reviewed: 06/20/2007 Exit[employer] Patient Information 2014 [employer], [place].   ________________________________________________________________________  WHAT IS A BLOOD TRANSFUSION? Blood Transfusion Information  A transfusion is the replacement of blood or some of its parts. Blood is made up of multiple cells which provide different functions.  Red blood cells carry oxygen and are used for blood loss replacement.  White blood cells fight against infection.  Platelets control bleeding.  Plasma helps clot blood.  Other blood products are available for specialized needs, such as hemophilia or other clotting disorders. BEFORE THE TRANSFUSION  Who gives blood for transfusions?   Healthy volunteers who are fully evaluated to make sure their blood is safe. This is blood bank blood. Transfusion therapy is the safest it has ever been in the practice of medicine. Before blood is taken  from a donor, a complete history is taken to make sure that person has no history of diseases nor engages in risky social behavior (examples are intravenous drug use or sexual activity with multiple partners). The donor's travel history is screened to minimize risk of transmitting infections, such as malaria. The donated blood is tested for signs of infectious diseases, such as HIV and hepatitis. The blood is then tested to be sure it is compatible with you in order to minimize the chance of a transfusion reaction. If you or a relative donates blood, this is often done in anticipation of surgery and is not appropriate for emergency situations. It takes many days to process the donated blood. RISKS AND COMPLICATIONS Although transfusion therapy is very safe and saves many lives, the main dangers of transfusion include:   Getting an infectious disease.  Developing a transfusion reaction. This is an allergic reaction to something in the blood you were given. Every precaution is taken to prevent this. The decision to have a blood transfusion has been considered carefully by your caregiver before blood is given. Blood is not given unless the benefits outweigh the risks. AFTER THE TRANSFUSION  Right after receiving a blood transfusion, you will usually feel much better and more energetic. This is especially true if your red blood cells have gotten low (anemic). The transfusion raises the level of the red blood cells which carry oxygen, and this usually causes an energy increase.  The  nurse administering the transfusion will monitor you carefully for complications. HOME CARE INSTRUCTIONS  No special instructions are needed after a transfusion. You may find your energy is better. Speak with your caregiver about any limitations on activity for underlying diseases you may have. SEEK MEDICAL CARE IF:   Your condition is not improving after your transfusion.  You develop redness or irritation at the  intravenous (IV) site. SEEK IMMEDIATE MEDICAL CARE IF:  Any of the following symptoms occur over the next 12 hours:  Shaking chills.  You have a temperature by mouth above 102 F (38.9 C), not controlled by medicine.  Chest, back, or muscle pain.  People around you feel you are not acting correctly or are confused.  Shortness of breath or difficulty breathing.  Dizziness and fainting.  You get a rash or develop hives.  You have a decrease in urine output.  Your urine turns a dark color or changes to pink, red, or brown. Any of the following symptoms occur over the next 10 days:  You have a temperature by mouth above 102 F (38.9 C), not controlled by medicine.  Shortness of breath.  Weakness after normal activity.  The white part of the eye turns yellow (jaundice).  You have a decrease in the amount of urine or are urinating less often.  Your urine turns a dark color or changes to pink, red, or brown. Document Released: 12/04/2000 Document Revised: 02/29/2012 Document Reviewed: 07/23/2008 [facility] Patient Information 2014 [place], [place].  _______________________________________________________________________

## 2018-03-07 NOTE — Pre-Procedure Instructions (Signed)
The following are in epic: EKG 02/08/18 Office visit note 02/08/18 K. Lawrence NP Lexiscan Myoview 02/15/18  Surgical clearance Dr. Joylene Draft 02/28/18 in chart

## 2018-03-08 ENCOUNTER — Encounter (HOSPITAL_COMMUNITY)
Admission: RE | Admit: 2018-03-08 | Discharge: 2018-03-08 | Disposition: A | Discharge status: Home / Self Care | Payer: PPO | Source: Ambulatory Visit | Attending: Orthopedic Surgery | Admitting: Orthopedic Surgery

## 2018-03-08 ENCOUNTER — Ambulatory Visit: Payer: Self-pay | Admitting: Orthopedic Surgery

## 2018-03-08 ENCOUNTER — Encounter (HOSPITAL_COMMUNITY): Payer: Self-pay

## 2018-03-08 ENCOUNTER — Other Ambulatory Visit: Payer: Self-pay

## 2018-03-08 DIAGNOSIS — M1611 Unilateral primary osteoarthritis, right hip: Secondary | ICD-10-CM | POA: Diagnosis not present

## 2018-03-08 DIAGNOSIS — Z01812 Encounter for preprocedural laboratory examination: Secondary | ICD-10-CM | POA: Insufficient documentation

## 2018-03-08 HISTORY — DX: Prediabetes: R73.03

## 2018-03-08 HISTORY — DX: Cardiac murmur, unspecified: R01.1

## 2018-03-08 HISTORY — DX: Other specified diseases of liver: K76.89

## 2018-03-08 HISTORY — DX: Anemia, unspecified: D64.9

## 2018-03-08 HISTORY — DX: Rheumatoid arthritis, unspecified: M06.9

## 2018-03-08 HISTORY — DX: Peptic ulcer, site unspecified, unspecified as acute or chronic, without hemorrhage or perforation: K27.9

## 2018-03-08 HISTORY — DX: Helicobacter pylori (H. pylori) as the cause of diseases classified elsewhere: B96.81

## 2018-03-08 HISTORY — DX: Personal history of other medical treatment: Z92.89

## 2018-03-08 HISTORY — DX: Personal history of other diseases of the digestive system: Z87.19

## 2018-03-08 LAB — CBC
HCT: 42.8 % (ref 36.0–46.0)
Hemoglobin: 14.2 g/dL (ref 12.0–15.0)
MCH: 30.5 pg (ref 26.0–34.0)
MCHC: 33.2 g/dL (ref 30.0–36.0)
MCV: 91.8 fL (ref 78.0–100.0)
PLATELETS: 308 10*3/uL (ref 150–400)
RBC: 4.66 MIL/uL (ref 3.87–5.11)
RDW: 14.9 % (ref 11.5–15.5)
WBC: 4.3 10*3/uL (ref 4.0–10.5)

## 2018-03-08 LAB — BASIC METABOLIC PANEL
Anion gap: 10 (ref 5–15)
BUN: 23 mg/dL — AB (ref 6–20)
CALCIUM: 10.3 mg/dL (ref 8.9–10.3)
CO2: 29 mmol/L (ref 22–32)
CREATININE: 1.07 mg/dL — AB (ref 0.44–1.00)
Chloride: 101 mmol/L (ref 101–111)
GFR calc Af Amer: 57 mL/min — ABNORMAL LOW (ref >60)
GFR, EST NON AFRICAN AMERICAN: 49 mL/min — AB (ref >60)
Glucose, Bld: 118 mg/dL — ABNORMAL HIGH (ref 65–99)
Potassium: 3.9 mmol/L (ref 3.5–5.1)
Sodium: 140 mmol/L (ref 135–145)

## 2018-03-08 LAB — HEMOGLOBIN A1C
HEMOGLOBIN A1C: 5.8 % — AB (ref 4.8–5.6)
MEAN PLASMA GLUCOSE: 119.76 mg/dL

## 2018-03-08 LAB — SURGICAL PCR SCREEN
MRSA, PCR: NEGATIVE
Staphylococcus aureus: NEGATIVE

## 2018-03-08 NOTE — Pre-Procedure Instructions (Signed)
Cardiac clearance note in imaging tab in epic from K. Lawrence NP. 02/15/2018

## 2018-03-08 NOTE — H&P (Signed)
TOTAL HIP ADMISSION H&P  Patient is admitted for right total hip arthroplasty.  Subjective:  Chief Complaint: right hip pain  HPI: [name], 77 y.o. female, has a history of pain and functional disability in the right hip(s) due to arthritis and patient has failed non-surgical conservative treatments for greater than 12 weeks to include NSAID's and/or analgesics, flexibility and strengthening excercises, use of assistive devices, weight reduction as appropriate and activity modification.  Onset of symptoms was gradual starting 2 years ago with gradually worsening course since that time.The patient noted no past surgery on the right hip(s).  Patient currently rates pain in the right hip at 10 out of 10 with activity. Patient has night pain, worsening of pain with activity and weight bearing, trendelenberg gait, pain that interfers with activities of daily living, pain with passive range of motion and crepitus. Patient has evidence of subchondral cysts, subchondral sclerosis, periarticular osteophytes, joint subluxation and joint space narrowing by imaging studies. This condition presents safety issues increasing the risk of falls  There is no current active infection.  Patient Active Problem List   Diagnosis Date Noted  . Severe obesity (BMI 35.0-35.9 with comorbidity) ([place]) 04/30/2017  . Essential hypertension 03/20/2016  . CAD (coronary artery disease), native coronary artery 06/06/2013  . Hyperlipidemia, poorly tolerant of statins 06/06/2013  . Esophageal reflux 12/20/2012  . Other dysphagia 12/20/2012  . Benign neoplasm of colon 12/20/2012  . GERD (gastroesophageal reflux disease) 12/06/2012  . Diverticulosis 12/06/2012  . Sleep apnea 12/06/2012  . Asthma 12/06/2012  . Thyroid disease 12/06/2012  . Malignant neoplasm of upper-outer quadrant of female breast ([place]) 04/20/2012  . Breast cancer of upper-outer quadrant of right female breast ([place]) 11/04/2011   Past Medical History:   Diagnosis Date  . Anemia    history of  . Arthritis    osteo - s/p right total knee  . Asthma    daily and prn inhalers  . Boil, axilla   . Breast cancer ([place]) 10/28/11   right breast  . CAD (coronary artery disease)   . GERD (gastroesophageal reflux disease)   . Grave's disease    no current meds.  . H pylori ulcer   . Heart murmur   . History of blood transfusion 2006  . History of chemotherapy    neoadjuvant: 5 cycles of Taxol/carboplatinum:  last dose 02/24/12  . History of hiatal hernia   . Hyperlipidemia   . Hypertension    under control with med.  . Hypothyroid   . Liver cyst    History of   . Pre-diabetes   . RA (rheumatoid arthritis) ([place])   . S/P chemotherapy, time since 4-12 weeks finished 02/20/2011  . S/P radiation therapy    Right Breast High Axilla and Supraclavicular gegion: 4500 cGy/25 Fractions with boost for a total of 6300 cGy  . Sleep apnea sleep study 09/18/2005   uses CPAP occ. - to bring machine DOS  . Spondylosis    cervical and lumbar  . Use of letrozole (Femara) start 07/21/12    Past Surgical History:  Procedure Laterality Date  . ABDOMINAL HYSTERECTOMY    . BILATERAL OOPHORECTOMY  2009  . Boil removal under arm Right   . BREAST BIOPSY Bilateral 1998  . BREAST LUMPECTOMY Right 03/2012  . CARDIAC CATHETERIZATION  04/10/2010, 05/07/2004   LAD lesion, blood flow OK, per pt.  Marland Kitchen CARDIOVASCULAR STRESS TEST  02/15/2018  . CARPAL TUNNEL RELEASE     left  .  CARPAL TUNNEL RELEASE Right   . CATARACT EXTRACTION, BILATERAL    . COLONOSCOPY  12/20/2012   Procedure: COLONOSCOPY;  Surgeon: Lafayette Dragon, MD;  Location: [facility] ENDOSCOPY;  Service: Endoscopy;  Laterality: N/A;  . ESOPHAGOGASTRODUODENOSCOPY  12/20/2012   Procedure: ESOPHAGOGASTRODUODENOSCOPY (EGD);  Surgeon: Lafayette Dragon, MD;  Location: [facility];  Service: Endoscopy;  Laterality: N/A;  . LESION REMOVAL  11/27/2011   Procedure: LESION REMOVAL;  Surgeon: Adin Hector, MD;  Location: [facility];  Service: General;  Laterality: Right;  Skin tag removed from under right eye. No specimen  . LUMBAR LAMINECTOMY     back surg. x 2 - 1980's and 1999  . MASTECTOMY PARTIAL / LUMPECTOMY  04/01/12   right breast, inv ductal ca, ER/PR  +, HER 2 -   . NM MYOVIEW LTD  08/02/2012   no ischemia  . PARTIAL HYSTERECTOMY     BSO  . PORTACATH PLACEMENT  11/27/2011   Procedure: INSERTION PORT-A-CATH;  Surgeon: Adin Hector, MD;  Location: [facility];  Service: General;  Laterality: Right;  . TOTAL KNEE ARTHROPLASTY  07/28/2006   right  . US ECHOCARDIOGRAPHY  08/14/2008   technically difficult-mild LVH, EF =>55%,mild annular ca+,AOV mildly sclerotic    Current Outpatient Medications  Medication Sig Dispense Refill Last Dose  . acetaminophen (TYLENOL 8 HOUR ARTHRITIS PAIN) 650 MG CR tablet Take 1,300 mg by mouth every 8 (eight) hours as needed for pain.     Marland Kitchen aspirin 81 MG tablet Take 81 mg by mouth daily.    Taking  . Cholecalciferol (VITAMIN D-3) 5000 units TABS Take 5,000 Units by mouth daily.     . diclofenac sodium (VOLTAREN) 1 % GEL Apply 2-4 g topically 4 (four) times daily as needed. For joint pain.  3   . diltiazem (CARDIZEM CD) 240 MG 24 hr capsule Take 240 mg by mouth daily.  3   . ezetimibe (ZETIA) 10 MG tablet Take 10 mg by mouth every evening.    Taking  . Fluticasone-Salmeterol (ADVAIR) 100-50 MCG/DOSE AEPB Inhale 1 puff into the lungs daily.    Taking  . folic acid (FOLVITE) 1 MG tablet Take 1 mg by mouth daily.   Taking  . letrozole (FEMARA) 2.5 MG tablet Take 1 tablet (2.5 mg total) by mouth daily. 90 tablet 3 Taking  . levothyroxine (SYNTHROID, LEVOTHROID) 75 MCG tablet Take 75 mcg by mouth daily before breakfast.   2 Taking  . methotrexate 250 MG/10ML injection Inject 25 mg into the vein every Monday.     . ranitidine (ZANTAC) 150 MG tablet Take 150 mg by mouth See admin instructions. Take 1 tablet (150 mg) by mouth scheduled in the morning, may  take an additional tablet in the evening if needed.   Taking  . rosuvastatin (CRESTOR) 10 MG tablet Take 10 mg by mouth 2 (two) times a week. Mondays & Wednesdays.  10 Taking  . Tiotropium Bromide Monohydrate (SPIRIVA RESPIMAT) 1.25 MCG/ACT AERS Inhale 2.5 mcg into the lungs daily as needed (for respiratory issues.).     Marland Kitchen traMADol (ULTRAM) 50 MG tablet Take 1 tablet (50 mg total) by mouth every 12 (twelve) hours as needed. (Patient taking differently: Take 50 mg by mouth every 12 (twelve) hours as needed (for pain.). ) 30 tablet  Taking  . triamterene-hydrochlorothiazide (MAXZIDE-25) 37.5-25 MG per tablet Take 1 tablet by mouth daily.    Taking   No current facility-administered medications for this  visit.    Allergies  Allergen Reactions  . Crestor [Rosuvastatin] Other (See Comments)    crampin of legs and feet--pt is currently taking.  . Lactose Intolerance (Gi) Other (See Comments)    ABD. CRAMPS  . Lipitor [Atorvastatin Calcium] Other (See Comments)    LEG CRAMPS  . Zocor [Simvastatin - High Dose] Other (See Comments)    Leg cramps    Social History   Tobacco Use  . Smoking status: Never Smoker  . Smokeless tobacco: Never Used  Substance Use Topics  . Alcohol use: No    Alcohol/week: 0.0 oz    Family History  Problem Relation Age of Onset  . Cancer Maternal Grandfather        unaware  . Anesthesia problems Cousin        pt. states she is not aware of any family anes. prob., does not know where this comment came from  . Cancer Cousin        breast, brain mets  . Cancer Cousin        ovarian, age 38-50  . Cancer Cousin        pancreatic     Review of Systems  Constitutional: Negative.   HENT: Negative.   Eyes: Negative.   Respiratory: Positive for cough and wheezing.   Cardiovascular: Negative.   Gastrointestinal: Positive for abdominal pain.  Genitourinary: Negative.   Musculoskeletal: Positive for joint pain.  Skin: Negative.   Neurological: Negative.    Endo/Heme/Allergies: Negative.   Psychiatric/Behavioral: Negative.     Objective:  Physical Exam  Vitals reviewed. Constitutional: She is oriented to person, place, and time. She appears well-developed and well-nourished.  HENT:  Head: Normocephalic and atraumatic.  Eyes: Conjunctivae and EOM are normal. Pupils are equal, round, and reactive to light.  Neck: Normal range of motion. Neck supple.  Cardiovascular: Normal rate, regular rhythm and intact distal pulses.  Respiratory: Effort normal. No respiratory distress.  GI: Soft. She exhibits no distension.  Genitourinary:  Genitourinary Comments: deferred  Musculoskeletal:       Right hip: She exhibits decreased range of motion, tenderness and crepitus.  Neurological: She is alert and oriented to person, place, and time. She has normal reflexes.  Skin: Skin is warm and dry.  Psychiatric: She has a normal mood and affect. Her behavior is normal. Judgment and thought content normal.    Vital signs in last 24 hours: @VSRANGES @  Labs:   Estimated body mass index is 35.78 kg/m as calculated from the following:   Height as of an earlier encounter on 03/08/18: 5\' 5"  (1.651 m).   Weight as of an earlier encounter on 03/08/18: 97.5 kg (215 lb).   Imaging Review Plain radiographs demonstrate severe degenerative joint disease of the right hip(s). The bone quality appears to be adequate for age and reported activity level.  Assessment/Plan:  End stage arthritis, right hip(s)  The patient history, physical examination, clinical judgement of the provider and imaging studies are consistent with end stage degenerative joint disease of the right hip(s) and total hip arthroplasty is deemed medically necessary. The treatment options including medical management, injection therapy, arthroscopy and arthroplasty were discussed at length. The risks and benefits of total hip arthroplasty were presented and reviewed. The risks due to aseptic  loosening, infection, stiffness, dislocation/subluxation,  thromboembolic complications and other imponderables were discussed.  The patient acknowledged the explanation, agreed to proceed with the plan and consent was signed. Patient is being admitted for inpatient treatment for surgery,  pain control, PT, OT, prophylactic antibiotics, VTE prophylaxis, progressive ambulation and ADL's and discharge planning.The patient is planning to be discharged home with HEP. Rx for DME given. MTX on hold.

## 2018-03-08 NOTE — H&P (View-Only) (Signed)
TOTAL HIP ADMISSION H&P  Patient is admitted for right total hip arthroplasty.  Subjective:  Chief Complaint: right hip pain  HPI: [name], 77 y.o. female, has a history of pain and functional disability in the right hip(s) due to arthritis and patient has failed non-surgical conservative treatments for greater than 12 weeks to include NSAID's and/or analgesics, flexibility and strengthening excercises, use of assistive devices, weight reduction as appropriate and activity modification.  Onset of symptoms was gradual starting 2 years ago with gradually worsening course since that time.The patient noted no past surgery on the right hip(s).  Patient currently rates pain in the right hip at 10 out of 10 with activity. Patient has night pain, worsening of pain with activity and weight bearing, trendelenberg gait, pain that interfers with activities of daily living, pain with passive range of motion and crepitus. Patient has evidence of subchondral cysts, subchondral sclerosis, periarticular osteophytes, joint subluxation and joint space narrowing by imaging studies. This condition presents safety issues increasing the risk of falls  There is no current active infection.  Patient Active Problem List   Diagnosis Date Noted  . Severe obesity (BMI 35.0-35.9 with comorbidity) ([place]) 04/30/2017  . Essential hypertension 03/20/2016  . CAD (coronary artery disease), native coronary artery 06/06/2013  . Hyperlipidemia, poorly tolerant of statins 06/06/2013  . Esophageal reflux 12/20/2012  . Other dysphagia 12/20/2012  . Benign neoplasm of colon 12/20/2012  . GERD (gastroesophageal reflux disease) 12/06/2012  . Diverticulosis 12/06/2012  . Sleep apnea 12/06/2012  . Asthma 12/06/2012  . Thyroid disease 12/06/2012  . Malignant neoplasm of upper-outer quadrant of female breast ([place]) 04/20/2012  . Breast cancer of upper-outer quadrant of right female breast ([place]) 11/04/2011   Past Medical History:   Diagnosis Date  . Anemia    history of  . Arthritis    osteo - s/p right total knee  . Asthma    daily and prn inhalers  . Boil, axilla   . Breast cancer ([place]) 10/28/11   right breast  . CAD (coronary artery disease)   . GERD (gastroesophageal reflux disease)   . Grave's disease    no current meds.  . H pylori ulcer   . Heart murmur   . History of blood transfusion 2006  . History of chemotherapy    neoadjuvant: 5 cycles of Taxol/carboplatinum:  last dose 02/24/12  . History of hiatal hernia   . Hyperlipidemia   . Hypertension    under control with med.  . Hypothyroid   . Liver cyst    History of   . Pre-diabetes   . RA (rheumatoid arthritis) ([place])   . S/P chemotherapy, time since 4-12 weeks finished 02/20/2011  . S/P radiation therapy    Right Breast High Axilla and Supraclavicular gegion: 4500 cGy/25 Fractions with boost for a total of 6300 cGy  . Sleep apnea sleep study 09/18/2005   uses CPAP occ. - to bring machine DOS  . Spondylosis    cervical and lumbar  . Use of letrozole (Femara) start 07/21/12    Past Surgical History:  Procedure Laterality Date  . ABDOMINAL HYSTERECTOMY    . BILATERAL OOPHORECTOMY  2009  . Boil removal under arm Right   . BREAST BIOPSY Bilateral 1998  . BREAST LUMPECTOMY Right 03/2012  . CARDIAC CATHETERIZATION  04/10/2010, 05/07/2004   LAD lesion, blood flow OK, per pt.  Marland Kitchen CARDIOVASCULAR STRESS TEST  02/15/2018  . CARPAL TUNNEL RELEASE     left  .  CARPAL TUNNEL RELEASE Right   . CATARACT EXTRACTION, BILATERAL    . COLONOSCOPY  12/20/2012   Procedure: COLONOSCOPY;  Surgeon: Lafayette Dragon, MD;  [facility]: [facility];  Service: Endoscopy;  Laterality: N/A;  . ESOPHAGOGASTRODUODENOSCOPY  12/20/2012   Procedure: ESOPHAGOGASTRODUODENOSCOPY (EGD);  Surgeon: Lafayette Dragon, MD;  Location: [facility];  Service: Endoscopy;  Laterality: N/A;  . LESION REMOVAL  11/27/2011   Procedure: LESION REMOVAL;  Surgeon: Adin Hector, MD;  Location: [facility];  Service: General;  Laterality: Right;  Skin tag removed from under right eye. No specimen  . LUMBAR LAMINECTOMY     back surg. x 2 - 1980's and 1999  . MASTECTOMY PARTIAL / LUMPECTOMY  04/01/12   right breast, inv ductal ca, ER/PR  +, HER 2 -   . NM MYOVIEW LTD  08/02/2012   no ischemia  . PARTIAL HYSTERECTOMY     BSO  . PORTACATH PLACEMENT  11/27/2011   Procedure: INSERTION PORT-A-CATH;  Surgeon: Adin Hector, MD;  Location: [facility];  Service: General;  Laterality: Right;  . TOTAL KNEE ARTHROPLASTY  07/28/2006   right  . US ECHOCARDIOGRAPHY  08/14/2008   technically difficult-mild LVH, EF =>55%,mild annular ca+,AOV mildly sclerotic    Current Outpatient Medications  Medication Sig Dispense Refill Last Dose  . acetaminophen (TYLENOL 8 HOUR ARTHRITIS PAIN) 650 MG CR tablet Take 1,300 mg by mouth every 8 (eight) hours as needed for pain.     Marland Kitchen aspirin 81 MG tablet Take 81 mg by mouth daily.    Taking  . Cholecalciferol (VITAMIN D-3) 5000 units TABS Take 5,000 Units by mouth daily.     . diclofenac sodium (VOLTAREN) 1 % GEL Apply 2-4 g topically 4 (four) times daily as needed. For joint pain.  3   . diltiazem (CARDIZEM CD) 240 MG 24 hr capsule Take 240 mg by mouth daily.  3   . ezetimibe (ZETIA) 10 MG tablet Take 10 mg by mouth every evening.    Taking  . Fluticasone-Salmeterol (ADVAIR) 100-50 MCG/DOSE AEPB Inhale 1 puff into the lungs daily.    Taking  . folic acid (FOLVITE) 1 MG tablet Take 1 mg by mouth daily.   Taking  . letrozole (FEMARA) 2.5 MG tablet Take 1 tablet (2.5 mg total) by mouth daily. 90 tablet 3 Taking  . levothyroxine (SYNTHROID, LEVOTHROID) 75 MCG tablet Take 75 mcg by mouth daily before breakfast.   2 Taking  . methotrexate 250 MG/10ML injection Inject 25 mg into the vein every Monday.     . ranitidine (ZANTAC) 150 MG tablet Take 150 mg by mouth See admin instructions. Take 1 tablet (150 mg) by mouth scheduled in the morning, may  take an additional tablet in the evening if needed.   Taking  . rosuvastatin (CRESTOR) 10 MG tablet Take 10 mg by mouth 2 (two) times a week. Mondays & Wednesdays.  10 Taking  . Tiotropium Bromide Monohydrate (SPIRIVA RESPIMAT) 1.25 MCG/ACT AERS Inhale 2.5 mcg into the lungs daily as needed (for respiratory issues.).     Marland Kitchen traMADol (ULTRAM) 50 MG tablet Take 1 tablet (50 mg total) by mouth every 12 (twelve) hours as needed. (Patient taking differently: Take 50 mg by mouth every 12 (twelve) hours as needed (for pain.). ) 30 tablet  Taking  . triamterene-hydrochlorothiazide (MAXZIDE-25) 37.5-25 MG per tablet Take 1 tablet by mouth daily.    Taking   No current facility-administered medications for this  visit.    Allergies  Allergen Reactions  . Crestor [Rosuvastatin] Other (See Comments)    crampin of legs and feet--pt is currently taking.  . Lactose Intolerance (Gi) Other (See Comments)    ABD. CRAMPS  . Lipitor [Atorvastatin Calcium] Other (See Comments)    LEG CRAMPS  . Zocor [Simvastatin - High Dose] Other (See Comments)    Leg cramps    Social History   Tobacco Use  . Smoking status: Never Smoker  . Smokeless tobacco: Never Used  Substance Use Topics  . Alcohol use: No    Alcohol/week: 0.0 oz    Family History  Problem Relation Age of Onset  . Cancer Maternal Grandfather        unaware  . Anesthesia problems Cousin        pt. states she is not aware of any family anes. prob., does not know where this comment came from  . Cancer Cousin        breast, brain mets  . Cancer Cousin        ovarian, age 65-50  . Cancer Cousin        pancreatic     Review of Systems  Constitutional: Negative.   HENT: Negative.   Eyes: Negative.   Respiratory: Positive for cough and wheezing.   Cardiovascular: Negative.   Gastrointestinal: Positive for abdominal pain.  Genitourinary: Negative.   Musculoskeletal: Positive for joint pain.  Skin: Negative.   Neurological: Negative.    Endo/Heme/Allergies: Negative.   Psychiatric/Behavioral: Negative.     Objective:  Physical Exam  Vitals reviewed. Constitutional: She is oriented to person, place, and time. She appears well-developed and well-nourished.  HENT:  Head: Normocephalic and atraumatic.  Eyes: Conjunctivae and EOM are normal. Pupils are equal, round, and reactive to light.  Neck: Normal range of motion. Neck supple.  Cardiovascular: Normal rate, regular rhythm and intact distal pulses.  Respiratory: Effort normal. No respiratory distress.  GI: Soft. She exhibits no distension.  Genitourinary:  Genitourinary Comments: deferred  Musculoskeletal:       Right hip: She exhibits decreased range of motion, tenderness and crepitus.  Neurological: She is alert and oriented to person, place, and time. She has normal reflexes.  Skin: Skin is warm and dry.  Psychiatric: She has a normal mood and affect. Her behavior is normal. Judgment and thought content normal.    Vital signs in last 24 hours: @VSRANGES @  Labs:   Estimated body mass index is 35.78 kg/m as calculated from the following:   Height as of an earlier encounter on 03/08/18: 5\' 5"  (1.651 m).   Weight as of an earlier encounter on 03/08/18: 97.5 kg (215 lb).   Imaging Review Plain radiographs demonstrate severe degenerative joint disease of the right hip(s). The bone quality appears to be adequate for age and reported activity level.  Assessment/Plan:  End stage arthritis, right hip(s)  The patient history, physical examination, clinical judgement of the provider and imaging studies are consistent with end stage degenerative joint disease of the right hip(s) and total hip arthroplasty is deemed medically necessary. The treatment options including medical management, injection therapy, arthroscopy and arthroplasty were discussed at length. The risks and benefits of total hip arthroplasty were presented and reviewed. The risks due to aseptic  loosening, infection, stiffness, dislocation/subluxation,  thromboembolic complications and other imponderables were discussed.  The patient acknowledged the explanation, agreed to proceed with the plan and consent was signed. Patient is being admitted for inpatient treatment for surgery,  pain control, PT, OT, prophylactic antibiotics, VTE prophylaxis, progressive ambulation and ADL's and discharge planning.The patient is planning to be discharged home with HEP. Rx for DME given. MTX on hold.

## 2018-03-08 NOTE — Pre-Procedure Instructions (Signed)
Hgb A1C and BMP results 03/08/2018 faxed to Dr. Lyla Glassing via epic.

## 2018-03-09 DIAGNOSIS — M1611 Unilateral primary osteoarthritis, right hip: Secondary | ICD-10-CM | POA: Diagnosis not present

## 2018-03-15 ENCOUNTER — Encounter: Payer: Self-pay | Admitting: Cardiovascular Disease

## 2018-03-15 ENCOUNTER — Ambulatory Visit (INDEPENDENT_AMBULATORY_CARE_PROVIDER_SITE_OTHER): Payer: PPO | Admitting: Cardiovascular Disease

## 2018-03-15 VITALS — BP 138/70 | HR 64 | Ht 65.0 in | Wt 222.0 lb

## 2018-03-15 DIAGNOSIS — E668 Other obesity: Secondary | ICD-10-CM | POA: Diagnosis not present

## 2018-03-15 DIAGNOSIS — I2510 Atherosclerotic heart disease of native coronary artery without angina pectoris: Secondary | ICD-10-CM

## 2018-03-15 DIAGNOSIS — I10 Essential (primary) hypertension: Secondary | ICD-10-CM | POA: Diagnosis not present

## 2018-03-15 DIAGNOSIS — E785 Hyperlipidemia, unspecified: Secondary | ICD-10-CM | POA: Diagnosis not present

## 2018-03-15 MED ORDER — ROSUVASTATIN CALCIUM 10 MG PO TABS: 10.0000 mg | ORAL_TABLET | ORAL | 3 refills | Status: DC

## 2018-03-15 NOTE — Patient Instructions (Signed)
Medication Instructions: Dr Sallyanne Kuster has recommended making the following medication changes: 1. INCREASE Rosuvastatin to 10 mg THREE times a week - Mondays, Wednesdays, and FRIDAYS  Labwork: Your physician recommends that you return for lab work in 3 months - FASTING.  Testing/Procedures: NONE ORDERED  Follow-up: Dr Sallyanne Kuster recommends that you schedule a follow-up appointment in 12 months. You will receive a reminder letter in the mail two months in advance. If you don't receive a letter, please call our office to schedule the follow-up appointment.  If you need a refill on your cardiac medications before your next appointment, please call your pharmacy.

## 2018-03-15 NOTE — Progress Notes (Signed)
Patient ID: [name], female   DOB: Dec 07, 1941, 77 y.o.   MRN: [id number]    Cardiology Office Note    Date:  03/15/2018   ID:  [name], DOB December 18, 1941, MRN [id number]  PCP:  [name], MD  Cardiologist:    Sanda Klein, MD   No chief complaint on file.   History of Present Illness:  [name] is a 77 y.o. female with a history of moderate coronary artery disease, hypertension, hyperlipidemia, severe obesity, reactive airway disease and treated hypothyroidism returning for follow-up after her nuclear stress test Feb 15, 2018 (preop for 03/28 total R hip replacement). The study was normal.  The patient specifically denies any chest pain at rest exertion, dyspnea at rest or with exertion, orthopnea, paroxysmal nocturnal dyspnea, syncope, palpitations, focal neurological deficits, intermittent claudication, lower extremity edema, unexplained weight gain, cough, hemoptysis or wheezing.  She is on  Zetia and only taking Crestor 10 mg half tablet twice a week.  She has not tolerated daily statins due to muscle cramps.  She had an intermediate severity stenosis of the mid LAD artery. This was demonstrated by coronary angiography in 2011, but shown not to be hemodynamically important by pressure wire analysis. She had some problems with intrascapular pressure in 2013 and in 2019 had nuclear stress tests that showed normal myocardial perfusion and normal left ventricular systolic function.  Past Medical History:  Diagnosis Date  . Anemia    history of  . Arthritis    osteo - s/p right total knee  . Asthma    daily and prn inhalers  . Boil, axilla   . Breast cancer ([place]) 10/28/11   right breast  . CAD (coronary artery disease)   . GERD (gastroesophageal reflux disease)   . Grave's disease    no current meds.  . H pylori ulcer   . Heart murmur   . History of blood transfusion 2006  . History of chemotherapy    neoadjuvant: 5 cycles of Taxol/carboplatinum:  last dose 02/24/12  .  History of hiatal hernia   . Hyperlipidemia   . Hypertension    under control with med.  . Hypothyroid   . Liver cyst    History of   . Pre-diabetes   . RA (rheumatoid arthritis) ([place])   . S/P chemotherapy, time since 4-12 weeks finished 02/20/2011  . S/P radiation therapy    Right Breast High Axilla and Supraclavicular gegion: 4500 cGy/25 Fractions with boost for a total of 6300 cGy  . Sleep apnea sleep study 09/18/2005   uses CPAP occ. - to bring machine DOS  . Spondylosis    cervical and lumbar  . Use of letrozole (Femara) start 07/21/12    Past Surgical History:  Procedure Laterality Date  . ABDOMINAL HYSTERECTOMY    . BILATERAL OOPHORECTOMY  2009  . Boil removal under arm Right   . BREAST BIOPSY Bilateral 1998  . BREAST LUMPECTOMY Right 03/2012  . CARDIAC CATHETERIZATION  04/10/2010, 05/07/2004   LAD lesion, blood flow OK, per pt.  Marland Kitchen CARDIOVASCULAR STRESS TEST  02/15/2018  . CARPAL TUNNEL RELEASE     left  . CARPAL TUNNEL RELEASE Right   . CATARACT EXTRACTION, BILATERAL    . COLONOSCOPY  12/20/2012   Procedure: COLONOSCOPY;  Surgeon: Lafayette Dragon, MD;  [facility];  Service: Endoscopy;  Laterality: N/A;  . ESOPHAGOGASTRODUODENOSCOPY  12/20/2012   Procedure: ESOPHAGOGASTRODUODENOSCOPY (EGD);  Surgeon: Lafayette Dragon, MD;  Location: [facility];  Service: Endoscopy;  Laterality: N/A;  . LESION REMOVAL  11/27/2011   Procedure: LESION REMOVAL;  Surgeon: Adin Hector, MD;  Location: [facility];  Service: General;  Laterality: Right;  Skin tag removed from under right eye. No specimen  . LUMBAR LAMINECTOMY     back surg. x 2 - 1980's and 1999  . MASTECTOMY PARTIAL / LUMPECTOMY  04/01/12   right breast, inv ductal ca, ER/PR  +, HER 2 -   . NM MYOVIEW LTD  08/02/2012   no ischemia  . PARTIAL HYSTERECTOMY     BSO  . PORTACATH PLACEMENT  11/27/2011   Procedure: INSERTION PORT-A-CATH;  Surgeon: Adin Hector, MD;  Location: [facility];   Service: General;  Laterality: Right;  . TOTAL KNEE ARTHROPLASTY  07/28/2006   right  . US ECHOCARDIOGRAPHY  08/14/2008   technically difficult-mild LVH, EF =>55%,mild annular ca+,AOV mildly sclerotic    Current Medications: Outpatient Medications Prior to Visit  Medication Sig Dispense Refill  . acetaminophen (TYLENOL 8 HOUR ARTHRITIS PAIN) 650 MG CR tablet Take 1,300 mg by mouth every 8 (eight) hours as needed for pain.    Marland Kitchen aspirin 81 MG tablet Take 81 mg by mouth daily.     . Cholecalciferol (VITAMIN D-3) 5000 units TABS Take 5,000 Units by mouth daily.    . diclofenac sodium (VOLTAREN) 1 % GEL Apply 2-4 g topically 4 (four) times daily as needed. For joint pain.  3  . diltiazem (CARDIZEM CD) 240 MG 24 hr capsule Take 240 mg by mouth daily.  3  . ezetimibe (ZETIA) 10 MG tablet Take 10 mg by mouth every evening.     . Fluticasone-Salmeterol (ADVAIR) 100-50 MCG/DOSE AEPB Inhale 1 puff into the lungs daily.     . folic acid (FOLVITE) 1 MG tablet Take 1 mg by mouth daily.    Marland Kitchen letrozole (FEMARA) 2.5 MG tablet Take 1 tablet (2.5 mg total) by mouth daily. 90 tablet 3  . levothyroxine (SYNTHROID, LEVOTHROID) 75 MCG tablet Take 75 mcg by mouth daily before breakfast.   2  . methotrexate 250 MG/10ML injection Inject 25 mg into the vein every Monday.    . ranitidine (ZANTAC) 150 MG tablet Take 150 mg by mouth See admin instructions. Take 1 tablet (150 mg) by mouth scheduled in the morning, may take an additional tablet in the evening if needed.    . Tiotropium Bromide Monohydrate (SPIRIVA RESPIMAT) 1.25 MCG/ACT AERS Inhale 2.5 mcg into the lungs daily as needed (for respiratory issues.).    Marland Kitchen traMADol (ULTRAM) 50 MG tablet Take 1 tablet (50 mg total) by mouth every 12 (twelve) hours as needed. (Patient taking differently: Take 50 mg by mouth every 12 (twelve) hours as needed (for pain.). ) 30 tablet   . triamterene-hydrochlorothiazide (MAXZIDE-25) 37.5-25 MG per tablet Take 1 tablet by mouth daily.      . rosuvastatin (CRESTOR) 10 MG tablet Take 10 mg by mouth 2 (two) times a week. Mondays & Wednesdays.  10   No facility-administered medications prior to visit.      Allergies:   Crestor [rosuvastatin]; Lactose intolerance (gi); Lipitor [atorvastatin calcium]; and Zocor [simvastatin - high dose]   Social History   Socioeconomic History  . Marital status: Single    Spouse name: Not on file  . Number of children: Not on file  . Years of education: Not on file  . Highest education level: Not on file  Occupational History  .  Occupation: retired    Fish farm manager: RETIRED  Social Needs  . Financial resource strain: Not on file  . Food insecurity:    Worry: Not on file    Inability: Not on file  . Transportation needs:    Medical: Not on file    Non-medical: Not on file  Tobacco Use  . Smoking status: Never Smoker  . Smokeless tobacco: Never Used  Substance and Sexual Activity  . Alcohol use: No    Alcohol/week: 0.0 oz  . Drug use: No  . Sexual activity: Not Currently    Birth control/protection: Surgical  Lifestyle  . Physical activity:    Days per week: Not on file    Minutes per session: Not on file  . Stress: Not on file  Relationships  . Social connections:    Talks on phone: Not on file    Gets together: Not on file    Attends religious service: Not on file    Active member of club or organization: Not on file    Attends meetings of clubs or organizations: Not on file    Relationship status: Not on file  Other Topics Concern  . Not on file  Social History Narrative   Retired ED nurse from [employer], single, 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      G0     Family History:  The patient's family history includes Anesthesia problems in her cousin; Cancer in her cousin, cousin, cousin, and maternal grandfather.   ROS:   Please see the history of present illness.    ROS All other systems reviewed and are negative.   PHYSICAL EXAM:   VS:  BP 138/70   Pulse 64   Ht 5\' 5"  (1.651  m)   Wt 222 lb (100.7 kg)   SpO2 99%   BMI 36.94 kg/m     General: Alert, oriented x3, no distress, obese Head: no evidence of trauma, PERRL, EOMI, no exophtalmos or lid lag, no myxedema, no xanthelasma; normal ears, nose and oropharynx Neck: normal jugular venous pulsations and no hepatojugular reflux; brisk carotid pulses without delay and no carotid bruits Chest: clear to auscultation, no signs of consolidation by percussion or palpation, normal fremitus, symmetrical and full respiratory excursions Cardiovascular: normal position and quality of the apical impulse, regular rhythm, normal first and second heart sounds, no murmurs, rubs or gallops Abdomen: no tenderness or distention, no masses by palpation, no abnormal pulsatility or arterial bruits, normal bowel sounds, no hepatosplenomegaly Extremities: no clubbing, cyanosis or edema; 2+ radial, ulnar and brachial pulses bilaterally; 2+ right femoral, posterior tibial and dorsalis pedis pulses; 2+ left femoral, posterior tibial and dorsalis pedis pulses; no subclavian or femoral bruits Neurological: grossly nonfocal Psych: Normal mood and affect   Wt Readings from Last 3 Encounters:  03/15/18 222 lb (100.7 kg)  03/08/18 215 lb (97.5 kg)  02/15/18 223 lb (101.2 kg)      Studies/Labs Reviewed:   EKG:  EKG is not ordered today.    Recent Labs: 03/08/2018 Hemoglobin A1c 5.8% BMET    Component Value Date/Time   NA 140 03/08/2018 1034   NA 142 12/17/2014 1039   K 3.9 03/08/2018 1034   K 3.5 12/17/2014 1039   CL 101 03/08/2018 1034   CL 101 04/24/2013 0904   CO2 29 03/08/2018 1034   CO2 31 (H) 12/17/2014 1039   GLUCOSE 118 (H) 03/08/2018 1034   GLUCOSE 108 12/17/2014 1039   GLUCOSE 116 (H) 04/24/2013 0904   BUN 23 (  H) 03/08/2018 1034   BUN 18.3 12/17/2014 1039   CREATININE 1.07 (H) 03/08/2018 1034   CREATININE 0.98 02/22/2015 0935   CREATININE 0.9 12/17/2014 1039   CALCIUM 10.3 03/08/2018 1034   CALCIUM 10.2 12/17/2014  1039   GFRNONAA 49 (L) 03/08/2018 1034   GFRAA 57 (L) 03/08/2018 1034    [facility] November 2018 cholesterol 232, HDL 67, LDL 151, triglycerides 72  ASSESSMENT:    1. Coronary artery disease involving native coronary artery of native heart without angina pectoris   2. Dyslipidemia   3. Essential hypertension   4. Moderate obesity      PLAN:  In order of problems listed above:  1. CAD: No symptoms of angina, normal recent nuclear stress test.  Has known moderate atherosclerosis in her coronary arteries by previous cardiac catheterization. 2. HLP: She is only taking Crestor 5 mg twice weekly and often less than that.  Encouraged switch to Repatha (her brother Marc Morgans uses it).  She wants to try taking Crestor 3 days weekly first to see if this works. 3. HTN: fair control   4. Obesity: strongly urged weight loss, may be able to exercise more after her hip replacement. 5. Preop risk eval: Low risk for major complications with planned orthopedic surgery.  Medication Adjustments/Labs and Tests Ordered: Current medicines are reviewed at length with the patient today.  Concerns regarding medicines are outlined above.  Medication changes, Labs and Tests ordered today are listed in the Patient Instructions below. Patient Instructions  Medication Instructions: Dr Sallyanne Kuster has recommended making the following medication changes: 1. INCREASE Rosuvastatin to 10 mg THREE times a week - Mondays, Wednesdays, and FRIDAYS  Labwork: Your physician recommends that you return for lab work in 3 months - FASTING.  Testing/Procedures: NONE ORDERED  Follow-up: Dr Sallyanne Kuster recommends that you schedule a follow-up appointment in 12 months. You will receive a reminder letter in the mail two months in advance. If you don't receive a letter, please call our office to schedule the follow-up appointment.  If you need a refill on your cardiac medications before your next appointment, please call your  pharmacy.    Signed, Sanda Klein, MD  03/15/2018 1:19 PM    [facility] [place], [place], [place]  [address] Phone: [phone]; Fax: [phone]

## 2018-03-16 MED ORDER — SODIUM CHLORIDE 0.9 % IV SOLN
1000.0000 mg | INTRAVENOUS | Status: AC
  Administered 2018-03-17: 1000 mg via INTRAVENOUS
  Filled 2018-03-16: qty 1100

## 2018-03-17 ENCOUNTER — Inpatient Hospital Stay (HOSPITAL_COMMUNITY): Payer: PPO | Admitting: Certified Registered Nurse Anesthetist

## 2018-03-17 ENCOUNTER — Inpatient Hospital Stay (HOSPITAL_COMMUNITY): Payer: PPO

## 2018-03-17 ENCOUNTER — Inpatient Hospital Stay (HOSPITAL_COMMUNITY)
Admission: RE | Admit: 2018-03-17 | Discharge: 2018-03-18 | DRG: 470 | Disposition: A | Discharge status: Home / Self Care | Payer: PPO | Attending: Orthopedic Surgery | Admitting: Orthopedic Surgery

## 2018-03-17 ENCOUNTER — Encounter (HOSPITAL_COMMUNITY): Payer: Self-pay | Admitting: *Deleted

## 2018-03-17 ENCOUNTER — Other Ambulatory Visit: Payer: Self-pay

## 2018-03-17 ENCOUNTER — Encounter (HOSPITAL_COMMUNITY)
Admission: RE | Disposition: A | Discharge status: Home / Self Care | Payer: Self-pay | Source: Home / Self Care | Attending: Orthopedic Surgery

## 2018-03-17 DIAGNOSIS — E039 Hypothyroidism, unspecified: Secondary | ICD-10-CM | POA: Diagnosis present

## 2018-03-17 DIAGNOSIS — E0500 Thyrotoxicosis with diffuse goiter without thyrotoxic crisis or storm: Secondary | ICD-10-CM | POA: Diagnosis present

## 2018-03-17 DIAGNOSIS — M069 Rheumatoid arthritis, unspecified: Secondary | ICD-10-CM | POA: Diagnosis present

## 2018-03-17 DIAGNOSIS — Z8041 Family history of malignant neoplasm of ovary: Secondary | ICD-10-CM | POA: Diagnosis not present

## 2018-03-17 DIAGNOSIS — Z853 Personal history of malignant neoplasm of breast: Secondary | ICD-10-CM

## 2018-03-17 DIAGNOSIS — M25751 Osteophyte, right hip: Secondary | ICD-10-CM | POA: Diagnosis present

## 2018-03-17 DIAGNOSIS — J45909 Unspecified asthma, uncomplicated: Secondary | ICD-10-CM | POA: Diagnosis present

## 2018-03-17 DIAGNOSIS — Z8711 Personal history of peptic ulcer disease: Secondary | ICD-10-CM | POA: Diagnosis not present

## 2018-03-17 DIAGNOSIS — C50411 Malignant neoplasm of upper-outer quadrant of right female breast: Secondary | ICD-10-CM | POA: Diagnosis not present

## 2018-03-17 DIAGNOSIS — K219 Gastro-esophageal reflux disease without esophagitis: Secondary | ICD-10-CM | POA: Diagnosis not present

## 2018-03-17 DIAGNOSIS — I2510 Atherosclerotic heart disease of native coronary artery without angina pectoris: Secondary | ICD-10-CM | POA: Diagnosis present

## 2018-03-17 DIAGNOSIS — Z79811 Long term (current) use of aromatase inhibitors: Secondary | ICD-10-CM | POA: Diagnosis not present

## 2018-03-17 DIAGNOSIS — Z90722 Acquired absence of ovaries, bilateral: Secondary | ICD-10-CM

## 2018-03-17 DIAGNOSIS — R7303 Prediabetes: Secondary | ICD-10-CM | POA: Diagnosis not present

## 2018-03-17 DIAGNOSIS — M1611 Unilateral primary osteoarthritis, right hip: Secondary | ICD-10-CM | POA: Diagnosis not present

## 2018-03-17 DIAGNOSIS — Z803 Family history of malignant neoplasm of breast: Secondary | ICD-10-CM | POA: Diagnosis not present

## 2018-03-17 DIAGNOSIS — Z96651 Presence of right artificial knee joint: Secondary | ICD-10-CM | POA: Diagnosis not present

## 2018-03-17 DIAGNOSIS — E739 Lactose intolerance, unspecified: Secondary | ICD-10-CM | POA: Diagnosis present

## 2018-03-17 DIAGNOSIS — Z9221 Personal history of antineoplastic chemotherapy: Secondary | ICD-10-CM

## 2018-03-17 DIAGNOSIS — Z888 Allergy status to other drugs, medicaments and biological substances status: Secondary | ICD-10-CM

## 2018-03-17 DIAGNOSIS — M25551 Pain in right hip: Secondary | ICD-10-CM | POA: Diagnosis present

## 2018-03-17 DIAGNOSIS — Z96641 Presence of right artificial hip joint: Secondary | ICD-10-CM | POA: Diagnosis not present

## 2018-03-17 DIAGNOSIS — Z7951 Long term (current) use of inhaled steroids: Secondary | ICD-10-CM

## 2018-03-17 DIAGNOSIS — Z7982 Long term (current) use of aspirin: Secondary | ICD-10-CM

## 2018-03-17 DIAGNOSIS — M47812 Spondylosis without myelopathy or radiculopathy, cervical region: Secondary | ICD-10-CM | POA: Diagnosis present

## 2018-03-17 DIAGNOSIS — I10 Essential (primary) hypertension: Secondary | ICD-10-CM | POA: Diagnosis not present

## 2018-03-17 DIAGNOSIS — Z471 Aftercare following joint replacement surgery: Secondary | ICD-10-CM | POA: Diagnosis not present

## 2018-03-17 DIAGNOSIS — E785 Hyperlipidemia, unspecified: Secondary | ICD-10-CM | POA: Diagnosis present

## 2018-03-17 DIAGNOSIS — Z79890 Hormone replacement therapy (postmenopausal): Secondary | ICD-10-CM

## 2018-03-17 DIAGNOSIS — Z90710 Acquired absence of both cervix and uterus: Secondary | ICD-10-CM | POA: Diagnosis not present

## 2018-03-17 DIAGNOSIS — Z923 Personal history of irradiation: Secondary | ICD-10-CM

## 2018-03-17 DIAGNOSIS — Z09 Encounter for follow-up examination after completed treatment for conditions other than malignant neoplasm: Secondary | ICD-10-CM

## 2018-03-17 DIAGNOSIS — Z79899 Other long term (current) drug therapy: Secondary | ICD-10-CM

## 2018-03-17 DIAGNOSIS — Z419 Encounter for procedure for purposes other than remedying health state, unspecified: Secondary | ICD-10-CM

## 2018-03-17 HISTORY — PX: TOTAL HIP ARTHROPLASTY: SHX124

## 2018-03-17 LAB — TYPE AND SCREEN
ABO/RH(D): O POS
ANTIBODY SCREEN: NEGATIVE

## 2018-03-17 SURGERY — ARTHROPLASTY, HIP, TOTAL, ANTERIOR APPROACH
Anesthesia: Spinal | Site: Hip | Laterality: Right

## 2018-03-17 MED ORDER — METOCLOPRAMIDE HCL 5 MG/ML IJ SOLN: 5.0000 mg | Freq: Three times a day (TID) | INTRAMUSCULAR | Status: DC | PRN

## 2018-03-17 MED ORDER — METHOCARBAMOL 1000 MG/10ML IJ SOLN
500.0000 mg | Freq: Four times a day (QID) | INTRAVENOUS | Status: DC | PRN
  Administered 2018-03-17: 500 mg via INTRAVENOUS
  Filled 2018-03-17: qty 550

## 2018-03-17 MED ORDER — HYDROCODONE-ACETAMINOPHEN 5-325 MG PO TABS
1.0000 | ORAL_TABLET | ORAL | Status: DC | PRN
  Administered 2018-03-18: 1 via ORAL
  Filled 2018-03-17: qty 1

## 2018-03-17 MED ORDER — KETOROLAC TROMETHAMINE 30 MG/ML IJ SOLN
INTRAMUSCULAR | Status: DC | PRN
  Administered 2018-03-17: 30 mg

## 2018-03-17 MED ORDER — GLYCOPYRROLATE 0.2 MG/ML IV SOSY
PREFILLED_SYRINGE | INTRAVENOUS | Status: DC | PRN
  Administered 2018-03-17: .2 mg via INTRAVENOUS

## 2018-03-17 MED ORDER — PHENOL 1.4 % MT LIQD
1.0000 | OROMUCOSAL | Status: DC | PRN
  Filled 2018-03-17: qty 177

## 2018-03-17 MED ORDER — HYDROCODONE-ACETAMINOPHEN 7.5-325 MG PO TABS: 1.0000 | ORAL_TABLET | ORAL | Status: DC | PRN

## 2018-03-17 MED ORDER — TRIAMTERENE-HCTZ 37.5-25 MG PO TABS
1.0000 | ORAL_TABLET | Freq: Every day | ORAL | Status: DC
  Administered 2018-03-17 – 2018-03-18 (×2): 1 via ORAL
  Filled 2018-03-17 (×2): qty 1

## 2018-03-17 MED ORDER — SODIUM CHLORIDE 0.9 % IJ SOLN
INTRAMUSCULAR | Status: AC
  Filled 2018-03-17: qty 50

## 2018-03-17 MED ORDER — KETOROLAC TROMETHAMINE 30 MG/ML IJ SOLN
INTRAMUSCULAR | Status: AC
  Filled 2018-03-17: qty 1

## 2018-03-17 MED ORDER — DILTIAZEM HCL ER COATED BEADS 240 MG PO CP24
240.0000 mg | ORAL_CAPSULE | Freq: Every day | ORAL | Status: DC
  Administered 2018-03-17 – 2018-03-18 (×2): 240 mg via ORAL
  Filled 2018-03-17 (×2): qty 1

## 2018-03-17 MED ORDER — PROPOFOL 10 MG/ML IV BOLUS
INTRAVENOUS | Status: DC | PRN
  Administered 2018-03-17: 20 mg via INTRAVENOUS

## 2018-03-17 MED ORDER — ISOPROPYL ALCOHOL 70 % SOLN
Status: AC
  Filled 2018-03-17: qty 480

## 2018-03-17 MED ORDER — ISOPROPYL ALCOHOL 70 % SOLN
Status: DC | PRN
  Administered 2018-03-17: 1 via TOPICAL

## 2018-03-17 MED ORDER — CHLORHEXIDINE GLUCONATE 4 % EX LIQD: 60.0000 mL | Freq: Once | CUTANEOUS | Status: DC

## 2018-03-17 MED ORDER — ONDANSETRON HCL 4 MG PO TABS
4.0000 mg | ORAL_TABLET | Freq: Four times a day (QID) | ORAL | Status: DC | PRN
  Administered 2018-03-18: 4 mg via ORAL
  Filled 2018-03-17: qty 1

## 2018-03-17 MED ORDER — DEXAMETHASONE SODIUM PHOSPHATE 10 MG/ML IJ SOLN
INTRAMUSCULAR | Status: DC | PRN
  Administered 2018-03-17: 10 mg via INTRAVENOUS

## 2018-03-17 MED ORDER — ALUM & MAG HYDROXIDE-SIMETH 200-200-20 MG/5ML PO SUSP: 30.0000 mL | ORAL | Status: DC | PRN

## 2018-03-17 MED ORDER — KETOROLAC TROMETHAMINE 15 MG/ML IJ SOLN
7.5000 mg | Freq: Four times a day (QID) | INTRAMUSCULAR | Status: AC
  Administered 2018-03-17 – 2018-03-18 (×4): 7.5 mg via INTRAVENOUS
  Filled 2018-03-17 (×4): qty 1

## 2018-03-17 MED ORDER — FOLIC ACID 1 MG PO TABS
1.0000 mg | ORAL_TABLET | Freq: Every day | ORAL | Status: DC
  Administered 2018-03-17 – 2018-03-18 (×2): 1 mg via ORAL
  Filled 2018-03-17 (×2): qty 1

## 2018-03-17 MED ORDER — EPHEDRINE SULFATE-NACL 50-0.9 MG/10ML-% IV SOSY
PREFILLED_SYRINGE | INTRAVENOUS | Status: DC | PRN
  Administered 2018-03-17: 10 mg via INTRAVENOUS

## 2018-03-17 MED ORDER — METHOCARBAMOL 500 MG PO TABS: 500.0000 mg | ORAL_TABLET | Freq: Four times a day (QID) | ORAL | Status: DC | PRN

## 2018-03-17 MED ORDER — ONDANSETRON HCL 4 MG/2ML IJ SOLN
INTRAMUSCULAR | Status: AC
  Filled 2018-03-17: qty 2

## 2018-03-17 MED ORDER — ONDANSETRON HCL 4 MG/2ML IJ SOLN: 4.0000 mg | Freq: Four times a day (QID) | INTRAMUSCULAR | Status: DC | PRN

## 2018-03-17 MED ORDER — PROPOFOL 10 MG/ML IV BOLUS
INTRAVENOUS | Status: AC
  Filled 2018-03-17: qty 20

## 2018-03-17 MED ORDER — METOCLOPRAMIDE HCL 5 MG PO TABS: 5.0000 mg | ORAL_TABLET | Freq: Three times a day (TID) | ORAL | Status: DC | PRN

## 2018-03-17 MED ORDER — DEXAMETHASONE SODIUM PHOSPHATE 10 MG/ML IJ SOLN
INTRAMUSCULAR | Status: AC
  Filled 2018-03-17: qty 1

## 2018-03-17 MED ORDER — BUPIVACAINE HCL (PF) 0.25 % IJ SOLN
INTRAMUSCULAR | Status: DC | PRN
  Administered 2018-03-17: 30 mL

## 2018-03-17 MED ORDER — FENTANYL CITRATE (PF) 100 MCG/2ML IJ SOLN
INTRAMUSCULAR | Status: DC | PRN
  Administered 2018-03-17: 50 ug via INTRAVENOUS

## 2018-03-17 MED ORDER — POVIDONE-IODINE 10 % EX SWAB
2.0000 "application " | Freq: Once | CUTANEOUS | Status: AC
  Administered 2018-03-17: 2 via TOPICAL

## 2018-03-17 MED ORDER — ASPIRIN 81 MG PO CHEW
81.0000 mg | CHEWABLE_TABLET | Freq: Two times a day (BID) | ORAL | Status: DC
  Administered 2018-03-17 – 2018-03-18 (×2): 81 mg via ORAL
  Filled 2018-03-17 (×2): qty 1

## 2018-03-17 MED ORDER — MENTHOL 3 MG MT LOZG: 1.0000 | LOZENGE | OROMUCOSAL | Status: DC | PRN

## 2018-03-17 MED ORDER — EPHEDRINE 5 MG/ML INJ
INTRAVENOUS | Status: AC
  Filled 2018-03-17: qty 10

## 2018-03-17 MED ORDER — WATER FOR IRRIGATION, STERILE IR SOLN
Status: DC | PRN
  Administered 2018-03-17 (×2): 2000 mL

## 2018-03-17 MED ORDER — PHENYLEPHRINE HCL 10 MG/ML IJ SOLN
INTRAMUSCULAR | Status: AC
  Filled 2018-03-17: qty 1

## 2018-03-17 MED ORDER — ONDANSETRON HCL 4 MG/2ML IJ SOLN
INTRAMUSCULAR | Status: DC | PRN
  Administered 2018-03-17: 4 mg via INTRAVENOUS

## 2018-03-17 MED ORDER — CEFAZOLIN SODIUM-DEXTROSE 2-4 GM/100ML-% IV SOLN
2.0000 g | Freq: Four times a day (QID) | INTRAVENOUS | Status: AC
  Administered 2018-03-17 – 2018-03-18 (×2): 2 g via INTRAVENOUS
  Filled 2018-03-17 (×2): qty 100

## 2018-03-17 MED ORDER — POLYETHYLENE GLYCOL 3350 17 G PO PACK: 17.0000 g | PACK | Freq: Every day | ORAL | Status: DC | PRN

## 2018-03-17 MED ORDER — GLYCOPYRROLATE 0.2 MG/ML IV SOSY
PREFILLED_SYRINGE | INTRAVENOUS | Status: AC
  Filled 2018-03-17: qty 3

## 2018-03-17 MED ORDER — PHENYLEPHRINE HCL 10 MG/ML IJ SOLN
INTRAVENOUS | Status: DC | PRN
  Administered 2018-03-17: 50 ug/min via INTRAVENOUS

## 2018-03-17 MED ORDER — ACETAMINOPHEN 325 MG PO TABS: 325.0000 mg | ORAL_TABLET | Freq: Four times a day (QID) | ORAL | Status: DC | PRN

## 2018-03-17 MED ORDER — FAMOTIDINE 20 MG PO TABS
20.0000 mg | ORAL_TABLET | Freq: Every day | ORAL | Status: DC
  Administered 2018-03-17 – 2018-03-18 (×2): 20 mg via ORAL
  Filled 2018-03-17 (×2): qty 1

## 2018-03-17 MED ORDER — FENTANYL CITRATE (PF) 100 MCG/2ML IJ SOLN
INTRAMUSCULAR | Status: AC
  Filled 2018-03-17: qty 2

## 2018-03-17 MED ORDER — MORPHINE SULFATE (PF) 2 MG/ML IV SOLN: 0.5000 mg | INTRAVENOUS | Status: DC | PRN

## 2018-03-17 MED ORDER — MOMETASONE FURO-FORMOTEROL FUM 100-5 MCG/ACT IN AERO
2.0000 | INHALATION_SPRAY | Freq: Two times a day (BID) | RESPIRATORY_TRACT | Status: DC
  Filled 2018-03-17 (×2): qty 8.8

## 2018-03-17 MED ORDER — EZETIMIBE 10 MG PO TABS
10.0000 mg | ORAL_TABLET | Freq: Every evening | ORAL | Status: DC
  Administered 2018-03-17: 10 mg via ORAL
  Filled 2018-03-17: qty 1

## 2018-03-17 MED ORDER — DOCUSATE SODIUM 100 MG PO CAPS
100.0000 mg | ORAL_CAPSULE | Freq: Two times a day (BID) | ORAL | Status: DC
  Administered 2018-03-17 – 2018-03-18 (×2): 100 mg via ORAL
  Filled 2018-03-17 (×2): qty 1

## 2018-03-17 MED ORDER — SODIUM CHLORIDE 0.9 % IV SOLN: INTRAVENOUS | Status: DC

## 2018-03-17 MED ORDER — PHENYLEPHRINE 40 MCG/ML (10ML) SYRINGE FOR IV PUSH (FOR BLOOD PRESSURE SUPPORT)
PREFILLED_SYRINGE | INTRAVENOUS | Status: AC
  Filled 2018-03-17: qty 10

## 2018-03-17 MED ORDER — CEFAZOLIN SODIUM-DEXTROSE 2-4 GM/100ML-% IV SOLN
2.0000 g | INTRAVENOUS | Status: DC
  Filled 2018-03-17: qty 100

## 2018-03-17 MED ORDER — BUPIVACAINE IN DEXTROSE 0.75-8.25 % IT SOLN
INTRATHECAL | Status: DC | PRN
  Administered 2018-03-17: 2 mL via INTRATHECAL

## 2018-03-17 MED ORDER — VITAMIN D 1000 UNITS PO TABS
5000.0000 [IU] | ORAL_TABLET | Freq: Every day | ORAL | Status: DC
  Administered 2018-03-17 – 2018-03-18 (×2): 5000 [IU] via ORAL
  Filled 2018-03-17 (×2): qty 5

## 2018-03-17 MED ORDER — SODIUM CHLORIDE 0.9 % IR SOLN
Status: DC | PRN
  Administered 2018-03-17: 1000 mL

## 2018-03-17 MED ORDER — PHENYLEPHRINE 40 MCG/ML (10ML) SYRINGE FOR IV PUSH (FOR BLOOD PRESSURE SUPPORT)
PREFILLED_SYRINGE | INTRAVENOUS | Status: DC | PRN
  Administered 2018-03-17 (×3): 80 ug via INTRAVENOUS

## 2018-03-17 MED ORDER — SODIUM CHLORIDE 0.9 % IJ SOLN
INTRAMUSCULAR | Status: DC | PRN
  Administered 2018-03-17: 30 mL

## 2018-03-17 MED ORDER — DIPHENHYDRAMINE HCL 12.5 MG/5ML PO ELIX: 12.5000 mg | ORAL_SOLUTION | ORAL | Status: DC | PRN

## 2018-03-17 MED ORDER — LACTATED RINGERS IV SOLN
INTRAVENOUS | Status: DC
  Administered 2018-03-17 (×2): via INTRAVENOUS

## 2018-03-17 MED ORDER — TIOTROPIUM BROMIDE MONOHYDRATE 18 MCG IN CAPS
18.0000 ug | ORAL_CAPSULE | Freq: Every day | RESPIRATORY_TRACT | Status: DC | PRN
  Filled 2018-03-17: qty 5

## 2018-03-17 MED ORDER — ACETAMINOPHEN 10 MG/ML IV SOLN
1000.0000 mg | INTRAVENOUS | Status: AC
  Administered 2018-03-17: 1000 mg via INTRAVENOUS
  Filled 2018-03-17: qty 100

## 2018-03-17 MED ORDER — PROPOFOL 500 MG/50ML IV EMUL
INTRAVENOUS | Status: DC | PRN
  Administered 2018-03-17: 40 ug/kg/min via INTRAVENOUS

## 2018-03-17 MED ORDER — LETROZOLE 2.5 MG PO TABS
2.5000 mg | ORAL_TABLET | Freq: Every day | ORAL | Status: DC
  Administered 2018-03-17 – 2018-03-18 (×2): 2.5 mg via ORAL
  Filled 2018-03-17 (×2): qty 1

## 2018-03-17 MED ORDER — CEFAZOLIN SODIUM-DEXTROSE 2-3 GM-%(50ML) IV SOLR
INTRAVENOUS | Status: DC | PRN
  Administered 2018-03-17: 2 g via INTRAVENOUS

## 2018-03-17 MED ORDER — HYDROMORPHONE HCL 1 MG/ML IJ SOLN: 0.2500 mg | INTRAMUSCULAR | Status: DC | PRN

## 2018-03-17 MED ORDER — TIOTROPIUM BROMIDE MONOHYDRATE 1.25 MCG/ACT IN AERS
2.5000 ug | INHALATION_SPRAY | Freq: Every day | RESPIRATORY_TRACT | Status: DC | PRN
  Filled 2018-03-17: qty 1

## 2018-03-17 MED ORDER — PROMETHAZINE HCL 25 MG/ML IJ SOLN: 6.2500 mg | INTRAMUSCULAR | Status: DC | PRN

## 2018-03-17 MED ORDER — DEXAMETHASONE SODIUM PHOSPHATE 10 MG/ML IJ SOLN
10.0000 mg | Freq: Once | INTRAMUSCULAR | Status: AC
  Administered 2018-03-18: 10 mg via INTRAVENOUS
  Filled 2018-03-17: qty 1

## 2018-03-17 MED ORDER — LEVOTHYROXINE SODIUM 75 MCG PO TABS
75.0000 ug | ORAL_TABLET | Freq: Every day | ORAL | Status: DC
  Administered 2018-03-18: 75 ug via ORAL
  Filled 2018-03-17: qty 1

## 2018-03-17 MED ORDER — BUPIVACAINE HCL (PF) 0.25 % IJ SOLN
INTRAMUSCULAR | Status: AC
  Filled 2018-03-17: qty 30

## 2018-03-17 MED ORDER — SODIUM CHLORIDE 0.9 % IV SOLN
INTRAVENOUS | Status: DC
  Administered 2018-03-17 – 2018-03-18 (×2): via INTRAVENOUS

## 2018-03-17 SURGICAL SUPPLY — 46 items
ADH SKN CLS APL DERMABOND .7 (GAUZE/BANDAGES/DRESSINGS) ×2
BAG DECANTER FOR FLEXI CONT (MISCELLANEOUS) IMPLANT
BAG SPEC THK2 15X12 ZIP CLS (MISCELLANEOUS)
BAG ZIPLOCK 12X15 (MISCELLANEOUS) IMPLANT
CAPT HIP TOTAL 2 ×2 IMPLANT
CHLORAPREP W/TINT 26ML (MISCELLANEOUS) ×2 IMPLANT
COVER PERINEAL POST (MISCELLANEOUS) ×2 IMPLANT
COVER SURGICAL LIGHT HANDLE (MISCELLANEOUS) ×2 IMPLANT
DECANTER SPIKE VIAL GLASS SM (MISCELLANEOUS) ×2 IMPLANT
DERMABOND ADVANCED (GAUZE/BANDAGES/DRESSINGS) ×2
DERMABOND ADVANCED .7 DNX12 (GAUZE/BANDAGES/DRESSINGS) ×2 IMPLANT
DRAPE SHEET LG 3/4 BI-LAMINATE (DRAPES) ×6 IMPLANT
DRAPE STERI IOBAN 125X83 (DRAPES) ×2 IMPLANT
DRAPE U-SHAPE 47X51 STRL (DRAPES) ×4 IMPLANT
DRSG AQUACEL AG ADV 3.5X10 (GAUZE/BANDAGES/DRESSINGS) ×2 IMPLANT
ELECT PENCIL ROCKER SW 15FT (MISCELLANEOUS) ×2 IMPLANT
ELECT REM PT RETURN 15FT ADLT (MISCELLANEOUS) ×2 IMPLANT
GAUZE SPONGE 4X4 12PLY STRL (GAUZE/BANDAGES/DRESSINGS) ×2 IMPLANT
GLOVE BIO SURGEON STRL SZ8.5 (GLOVE) ×4 IMPLANT
GLOVE BIOGEL M STRL SZ7.5 (GLOVE) ×4 IMPLANT
GLOVE BIOGEL PI IND STRL 7.0 (GLOVE) ×6 IMPLANT
GLOVE BIOGEL PI IND STRL 8.5 (GLOVE) ×1 IMPLANT
GLOVE BIOGEL PI INDICATOR 7.0 (GLOVE) ×6
GLOVE BIOGEL PI INDICATOR 8.5 (GLOVE) ×1
GOWN L4 XLG 20 PK N/S (GOWN DISPOSABLE) ×8 IMPLANT
GOWN SPEC L3 XXLG W/TWL (GOWN DISPOSABLE) ×2 IMPLANT
HANDPIECE INTERPULSE COAX TIP (DISPOSABLE) ×2
HOLDER FOLEY CATH W/STRAP (MISCELLANEOUS) ×2 IMPLANT
HOOD PEEL AWAY FLYTE STAYCOOL (MISCELLANEOUS) ×6 IMPLANT
MARKER SKIN DUAL TIP RULER LAB (MISCELLANEOUS) ×2 IMPLANT
NEEDLE SPNL 18GX3.5 QUINCKE PK (NEEDLE) ×2 IMPLANT
PACK ANTERIOR HIP CUSTOM (KITS) ×2 IMPLANT
SAW OSC TIP CART 19.5X105X1.3 (SAW) ×2 IMPLANT
SEALER BIPOLAR AQUA 6.0 (INSTRUMENTS) ×2 IMPLANT
SET HNDPC FAN SPRY TIP SCT (DISPOSABLE) ×1 IMPLANT
SUT ETHIBOND NAB CT1 #1 30IN (SUTURE) ×4 IMPLANT
SUT MNCRL AB 3-0 PS2 18 (SUTURE) ×2 IMPLANT
SUT MON AB 2-0 CT1 36 (SUTURE) ×4 IMPLANT
SUT STRATAFIX PDO 1 14 VIOLET (SUTURE) ×1
SUT STRATFX PDO 1 14 VIOLET (SUTURE) ×1
SUT VIC AB 2-0 CT1 27 (SUTURE) ×2
SUT VIC AB 2-0 CT1 TAPERPNT 27 (SUTURE) ×1 IMPLANT
SUTURE STRATFX PDO 1 14 VIOLET (SUTURE) ×1 IMPLANT
TRAY FOLEY CATH 14FR (SET/KITS/TRAYS/PACK) ×2 IMPLANT
WATER STERILE IRR 1000ML POUR (IV SOLUTION) ×4 IMPLANT
YANKAUER SUCT BULB TIP 10FT TU (MISCELLANEOUS) ×2 IMPLANT

## 2018-03-17 NOTE — Anesthesia Procedure Notes (Addendum)
Spinal  Patient location during procedure: OR Start time: 03/17/2018 11:09 AM End time: 03/17/2018 11:15 AM Staffing Anesthesiologist: Myrtie Soman, MD Performed: anesthesiologist  Preanesthetic Checklist Completed: patient identified, site marked, surgical consent, pre-op evaluation, timeout performed, IV checked, risks and benefits discussed and monitors and equipment checked Spinal Block Patient position: sitting Prep: Betadine Patient monitoring: heart rate, continuous pulse ox and blood pressure Location: L3-4 Injection technique: single-shot Needle Needle type: Sprotte  Needle gauge: 24 G Needle length: 9 cm Additional Notes Expiration date of kit checked and confirmed. Patient tolerated procedure well, without complications.

## 2018-03-17 NOTE — Discharge Instructions (Signed)
°Dr. Quintan Saldivar °Joint Replacement Specialist °[facility] °[address] °[place], [place] [address] °[phone] ° ° °TOTAL HIP REPLACEMENT POSTOPERATIVE DIRECTIONS ° ° ° °Hip Rehabilitation, Guidelines Following Surgery  ° °WEIGHT BEARING °Weight bearing as tolerated with assist device (walker, cane, etc) as directed, use it as long as suggested by your surgeon or therapist, typically at least 4-6 weeks. ° °The results of a hip operation are greatly improved after range of motion and muscle strengthening exercises. Follow all safety measures which are given to protect your hip. If any of these exercises cause increased pain or swelling in your joint, decrease the amount until you are comfortable again. Then slowly increase the exercises. Call your caregiver if you have problems or questions.  ° °HOME CARE INSTRUCTIONS  °Most of the following instructions are designed to prevent the dislocation of your new hip.  °Remove items at home which could result in a fall. This includes throw rugs or furniture in walking pathways.  °Continue medications as instructed at time of discharge. °· You may have some home medications which will be placed on hold until you complete the course of blood thinner medication. °· You may start showering once you are discharged home. Do not remove your dressing. °Do not put on socks or shoes without following the instructions of your caregivers.   °Sit on chairs with arms. Use the chair arms to help push yourself up when arising.  °Arrange for the use of a toilet seat elevator so you are not sitting low.  °· Walk with walker as instructed.  °You may resume a sexual relationship in one month or when given the OK by your caregiver.  °Use walker as long as suggested by your caregivers.  °You may put full weight on your legs and walk as much as is comfortable. °Avoid periods of inactivity such as sitting longer than an hour when not asleep. This helps prevent  blood clots.  °You may return to work once you are cleared by your surgeon.  °Do not drive a car for 6 weeks or until released by your surgeon.  °Do not drive while taking narcotics.  °Wear elastic stockings for two weeks following surgery during the day but you may remove then at night.  °Make sure you keep all of your appointments after your operation with all of your doctors and caregivers. You should call the office at the above phone number and make an appointment for approximately two weeks after the date of your surgery. °Please pick up a stool softener and laxative for home use as long as you are requiring pain medications. °· ICE to the affected hip every three hours for 30 minutes at a time and then as needed for pain and swelling. Continue to use ice on the hip for pain and swelling from surgery. You may notice swelling that will progress down to the foot and ankle.  This is normal after surgery.  Elevate the leg when you are not up walking on it.   °It is important for you to complete the blood thinner medication as prescribed by your doctor. °· Continue to use the breathing machine which will help keep your temperature down.  It is common for your temperature to cycle up and down following surgery, especially at night when you are not up moving around and exerting yourself.  The breathing machine keeps your lungs expanded and your temperature down. ° °RANGE OF MOTION AND STRENGTHENING EXERCISES  °These exercises are   designed to help you keep full movement of your hip joint. Follow your caregiver's or physical therapist's instructions. Perform all exercises about fifteen times, three times per day or as directed. Exercise both hips, even if you have had only one joint replacement. These exercises can be done on a training (exercise) mat, on the floor, on a table or on a bed. Use whatever works the best and is most comfortable for you. Use music or television while you are exercising so that the exercises  are a pleasant break in your day. This will make your life better with the exercises acting as a break in routine you can look forward to.  °Lying on your back, slowly slide your foot toward your buttocks, raising your knee up off the floor. Then slowly slide your foot back down until your leg is straight again.  °Lying on your back spread your legs as far apart as you can without causing discomfort.  °Lying on your side, raise your upper leg and foot straight up from the floor as far as is comfortable. Slowly lower the leg and repeat.  °Lying on your back, tighten up the muscle in the front of your thigh (quadriceps muscles). You can do this by keeping your leg straight and trying to raise your heel off the floor. This helps strengthen the largest muscle supporting your knee.  °Lying on your back, tighten up the muscles of your buttocks both with the legs straight and with the knee bent at a comfortable angle while keeping your heel on the floor.  ° °SKILLED REHAB INSTRUCTIONS: °If the patient is transferred to a skilled rehab facility following release from the hospital, a list of the current medications will be sent to the facility for the patient to continue.  When discharged from the skilled rehab facility, please have the facility set up the patient's Home Health Physical Therapy prior to being released. Also, the skilled facility will be responsible for providing the patient with their medications at time of release from the facility to include their pain medication and their blood thinner medication. If the patient is still at the rehab facility at time of the two week follow up appointment, the skilled rehab facility will also need to assist the patient in arranging follow up appointment in our office and any transportation needs. ° °MAKE SURE YOU:  °Understand these instructions.  °Will watch your condition.  °Will get help right away if you are not doing well or get worse. ° °Pick up stool softner and  laxative for home use following surgery while on pain medications. °Do not remove your dressing. °The dressing is waterproof--it is OK to take showers. °Continue to use ice for pain and swelling after surgery. °Do not use any lotions or creams on the incision until instructed by your surgeon. °Total Hip Protocol. ° ° °

## 2018-03-17 NOTE — Op Note (Signed)
OPERATIVE REPORT  SURGEON: Rod Can, MD   ASSISTANT: Nehemiah Massed, PA-C.  PREOPERATIVE DIAGNOSIS: Right hip arthritis.   POSTOPERATIVE DIAGNOSIS: Right hip arthritis.   PROCEDURE: Right total hip arthroplasty, anterior approach.   IMPLANTS: DePuy Tri Lock stem, size 5, hi offset. DePuy Pinnacle Cup, size 56 mm. DePuy Altrx liner, size 36 by 56 mm,  neutral. DePuy Biolox ceramic head ball, size 36 + 1.5 mm.  ANESTHESIA:  Spinal  ESTIMATED BLOOD LOSS:-250 mL    ANTIBIOTICS: 2 g Ancef.  DRAINS: None.  COMPLICATIONS: None.   CONDITION: PACU - hemodynamically stable.   BRIEF CLINICAL NOTE: [name] is a 77 y.o. female with a long-standing history of Right hip arthritis. After failing conservative management, the patient was indicated for total hip arthroplasty. The risks, benefits, and alternatives to the procedure were explained, and the patient elected to proceed.  PROCEDURE IN DETAIL: Surgical site was marked by myself in the pre-op holding area. Once inside the operating room, spinal anesthesia was obtained, and a foley catheter was inserted. The patient was then positioned on the Hana table. All bony prominences were well padded. The hip was prepped and draped in the normal sterile surgical fashion. A time-out was called verifying side and site of surgery. The patient received IV antibiotics within 60 minutes of beginning the procedure.  The direct anterior approach to the hip was performed through the Hueter interval. Lateral femoral circumflex vessels were treated with the Auqumantys. The anterior capsule was exposed and an inverted T capsulotomy was made.The femoral neck cut was made to the level of the templated cut. A corkscrew was placed into the head and the head was removed. The femoral head was found to have eburnated bone. The head was passed to the back table and was measured.  Acetabular exposure was achieved, and the pulvinar and labrum were  excised. Sequential reaming of the acetabulum was then performed up to a size 55 mm reamer. A 56 mm cup was then opened and impacted into place at approximately 40 degrees of abduction and 20 degrees of anteversion. The final polyethylene liner was impacted into place and acetabular osteophytes were removed.   I then gained femoral exposure taking care to protect the abductors and greater trochanter. This was performed using standard external rotation, extension, and adduction. The capsule was peeled off the inner aspect of the greater trochanter, taking care to preserve the short external rotators. A cookie cutter was used to enter the femoral canal, and then the femoral canal finder was placed. Sequential broaching was performed up to a size 4. Calcar planer was used on the femoral neck remnant. I placed a hi offset neck and a trial head ball. The hip was reduced. Leg lengths and offset were checked fluoroscopically.The hip was dislocated and trial components were removed.  Soft tissue tension was inadequate, so I broached up to a size 5. The final implants were placed, and the hip was reduced.  Fluoroscopy was used to confirm component position and leg lengths. At 90 degrees of external rotation and full extension, the hip was stable to an anterior directed force.  Lengthening was required to restore soft tissue stability.  The wound was copiously irrigated with normal saline using pulse lavage. Marcaine solution was injected into the periarticular soft tissue. The wound was closed in layers using #1 Vicryl and V-Loc for the fascia, 2-0 Vicryl for the subcutaneous fat, 2-0 Monocryl for the deep dermal layer, 3-0 running Monocryl subcuticular stitch, and Dermabond  for the skin. Once the glue was fully dried, an Aquacell Ag dressing was applied. The patient was transported to the recovery room in stable condition. Sponge, needle, and instrument counts were correct at the end of the case x2.  The patient tolerated the procedure well and there were no known complications.  Please note that a surgical assistant was a medical necessity for this procedure to perform it in a safe and expeditious manner. Assistant was necessary to provide appropriate retraction of vital neurovascular structures, to prevent femoral fracture, and to allow for anatomic placement of the prosthesis.

## 2018-03-17 NOTE — Anesthesia Preprocedure Evaluation (Signed)
Anesthesia Evaluation  Patient identified by MRN, date of birth, ID band Patient awake    Reviewed: Allergy & Precautions, NPO status , Patient's Chart, lab work & pertinent test results  Airway Mallampati: II  TM Distance: <3 FB Neck ROM: Full    Dental no notable dental hx.    Pulmonary neg pulmonary ROS, sleep apnea and Continuous Positive Airway Pressure Ventilation ,    Pulmonary exam normal breath sounds clear to auscultation       Cardiovascular hypertension, Normal cardiovascular exam Rhythm:Regular Rate:Normal   Normal resting and stress perfusion. No ischemia or infarction EF 61% Overall Study Impression Myocardial perfusion is normal. The study is normal. This is a low risk study. Overall left ventricular systolic function was normal. LV cavity size is normal. Nuclear stress EF: 61%. The left ventricular ejection fraction is normal (55-65%     Neuro/Psych negative neurological ROS  negative psych ROS   GI/Hepatic Neg liver ROS, GERD  Medicated,  Endo/Other  Hypothyroidism   Renal/GU negative Renal ROS  negative genitourinary   Musculoskeletal negative musculoskeletal ROS (+)   Abdominal   Peds negative pediatric ROS (+)  Hematology negative hematology ROS (+)   Anesthesia Other Findings   Reproductive/Obstetrics negative OB ROS                             Anesthesia Physical Anesthesia Plan  ASA: III  Anesthesia Plan: Spinal   Post-op Pain Management:    Induction: Intravenous  PONV Risk Score and Plan: 2 and Ondansetron and Dexamethasone  Airway Management Planned: Simple Face Mask  Additional Equipment:   Intra-op Plan:   Post-operative Plan:   Informed Consent: I have reviewed the patients History and Physical, chart, labs and discussed the procedure including the risks, benefits and alternatives for the proposed anesthesia with the patient or authorized  representative who has indicated his/her understanding and acceptance.   Dental advisory given  Plan Discussed with: CRNA and Surgeon  Anesthesia Plan Comments:         Anesthesia Quick Evaluation

## 2018-03-17 NOTE — Transfer of Care (Signed)
Immediate Anesthesia Transfer of Care Note  Patient: [name]  Procedure(s) Performed: Procedure(s) with comments: RIGHT TOTAL HIP ARTHROPLASTY ANTERIOR APPROACH (Right) - Needs RNFA  Patient Location: PACU  Anesthesia Type:MAC and Spinal  Level of Consciousness: Patient easily awoken, sedated, comfortable, cooperative, following commands, responds to stimulation.   Airway & Oxygen Therapy: Patient spontaneously breathing, ventilating well, oxygen via simple oxygen mask.  Post-op Assessment: Report given to PACU RN, vital signs reviewed and stable, moving all extremities.   Post vital signs: Reviewed and stable.  Complications: No apparent anesthesia complications  Last Vitals:  Vitals Value Taken Time  BP 128/63 03/17/2018  1:53 PM  Temp    Pulse 98 03/17/2018  1:56 PM  Resp 14 03/17/2018  1:56 PM  SpO2 100 % 03/17/2018  1:56 PM  Vitals shown include unvalidated device data.  Last Pain:  Vitals:   03/17/18 1049  TempSrc:   PainSc: 0-No pain         Complications: No apparent anesthesia complications

## 2018-03-17 NOTE — Interval H&P Note (Signed)
History and Physical Interval Note:  03/17/2018 10:27 AM  [name]  has presented today for surgery, with the diagnosis of Right hip degenerative joint disease  The various methods of treatment have been discussed with the patient and family. After consideration of risks, benefits and other options for treatment, the patient has consented to  Procedure(s) with comments: RIGHT TOTAL HIP ARTHROPLASTY ANTERIOR APPROACH (Right) - Needs RNFA as a surgical intervention .  The patient's history has been reviewed, patient examined, no change in status, stable for surgery.  I have reviewed the patient's chart and labs.  Questions were answered to the patient's satisfaction.     Hilton Cork Bralyn Folkert

## 2018-03-17 NOTE — Anesthesia Postprocedure Evaluation (Signed)
Anesthesia Post Note  Patient: Carvel Getting  Procedure(s) Performed: RIGHT TOTAL HIP ARTHROPLASTY ANTERIOR APPROACH (Right Hip)     Patient location during evaluation: PACU Anesthesia Type: Spinal Level of consciousness: oriented and awake and alert Pain management: pain level controlled Vital Signs Assessment: post-procedure vital signs reviewed and stable Respiratory status: spontaneous breathing, respiratory function stable and patient connected to nasal cannula oxygen Cardiovascular status: blood pressure returned to baseline and stable Postop Assessment: no headache, no backache and no apparent nausea or vomiting Anesthetic complications: no    Last Vitals:  Vitals:   03/17/18 1500 03/17/18 1515  BP: 132/67 125/71  Pulse: 66 75  Resp: 10 17  Temp:    SpO2: 100% 100%    Last Pain:  Vitals:   03/17/18 1515  TempSrc:   PainSc: 0-No pain                 Duan Scharnhorst S

## 2018-03-18 LAB — CBC
HEMATOCRIT: 33.7 % — AB (ref 36.0–46.0)
HEMOGLOBIN: 11.4 g/dL — AB (ref 12.0–15.0)
MCH: 30.3 pg (ref 26.0–34.0)
MCHC: 33.8 g/dL (ref 30.0–36.0)
MCV: 89.6 fL (ref 78.0–100.0)
Platelets: 242 10*3/uL (ref 150–400)
RBC: 3.76 MIL/uL — ABNORMAL LOW (ref 3.87–5.11)
RDW: 14.2 % (ref 11.5–15.5)
WBC: 9.9 10*3/uL (ref 4.0–10.5)

## 2018-03-18 LAB — BASIC METABOLIC PANEL
Anion gap: 9 (ref 5–15)
BUN: 20 mg/dL (ref 6–20)
CHLORIDE: 103 mmol/L (ref 101–111)
CO2: 28 mmol/L (ref 22–32)
CREATININE: 0.84 mg/dL (ref 0.44–1.00)
Calcium: 9.1 mg/dL (ref 8.9–10.3)
GFR calc non Af Amer: >60 mL/min (ref >60)
Glucose, Bld: 150 mg/dL — ABNORMAL HIGH (ref 65–99)
Potassium: 3.6 mmol/L (ref 3.5–5.1)
Sodium: 140 mmol/L (ref 135–145)

## 2018-03-18 MED ORDER — ASPIRIN 81 MG PO CHEW: 81.0000 mg | CHEWABLE_TABLET | Freq: Two times a day (BID) | ORAL | 1 refills | Status: DC

## 2018-03-18 MED ORDER — DEXAMETHASONE SODIUM PHOSPHATE 10 MG/ML IJ SOLN
INTRAMUSCULAR | Status: AC
  Filled 2018-03-18: qty 1

## 2018-03-18 MED ORDER — ROCURONIUM BROMIDE 10 MG/ML (PF) SYRINGE
PREFILLED_SYRINGE | INTRAVENOUS | Status: AC
  Filled 2018-03-18: qty 5

## 2018-03-18 MED ORDER — SUGAMMADEX SODIUM 200 MG/2ML IV SOLN
INTRAVENOUS | Status: AC
  Filled 2018-03-18: qty 2

## 2018-03-18 MED ORDER — DOCUSATE SODIUM 100 MG PO CAPS: 100.0000 mg | ORAL_CAPSULE | Freq: Two times a day (BID) | ORAL | 1 refills | Status: DC

## 2018-03-18 MED ORDER — ONDANSETRON HCL 4 MG PO TABS: 4.0000 mg | ORAL_TABLET | Freq: Four times a day (QID) | ORAL | 0 refills | Status: DC | PRN

## 2018-03-18 MED ORDER — PHENYLEPHRINE 40 MCG/ML (10ML) SYRINGE FOR IV PUSH (FOR BLOOD PRESSURE SUPPORT)
PREFILLED_SYRINGE | INTRAVENOUS | Status: AC
  Filled 2018-03-18: qty 10

## 2018-03-18 MED ORDER — HYDROCODONE-ACETAMINOPHEN 5-325 MG PO TABS: 1.0000 | ORAL_TABLET | ORAL | 0 refills | Status: DC | PRN

## 2018-03-18 MED ORDER — LIDOCAINE 2% (20 MG/ML) 5 ML SYRINGE
INTRAMUSCULAR | Status: AC
  Filled 2018-03-18: qty 5

## 2018-03-18 MED ORDER — ONDANSETRON HCL 4 MG/2ML IJ SOLN
INTRAMUSCULAR | Status: AC
  Filled 2018-03-18: qty 2

## 2018-03-18 NOTE — Discharge Summary (Signed)
Physician Discharge Summary  Patient ID: [name] MRN: [id number] DOB/AGE: June 20, 1941 77 y.o.  Admit date: 03/17/2018 Discharge date: 03/18/2018  Admission Diagnoses:  Osteoarthritis of right hip  Discharge Diagnoses:  Principal Problem:   Osteoarthritis of right hip Active Problems:   Primary osteoarthritis of right hip   Past Medical History:  Diagnosis Date  . Anemia    history of  . Arthritis    osteo - s/p right total knee  . Asthma    daily and prn inhalers  . Boil, axilla   . Breast cancer (HCC) 10/28/11   right breast  . CAD (coronary artery disease)   . GERD (gastroesophageal reflux disease)   . Grave's disease    no current meds.  . H pylori ulcer   . Heart murmur   . History of blood transfusion 2006  . History of chemotherapy    neoadjuvant: 5 cycles of Taxol/carboplatinum:  last dose 02/24/12  . History of hiatal hernia   . Hyperlipidemia   . Hypertension    under control with med.  . Hypothyroid   . Liver cyst    History of   . Pre-diabetes   . RA (rheumatoid arthritis) (HCC)   . S/P chemotherapy, time since 4-12 weeks finished 02/20/2011  . S/P radiation therapy    Right Breast High Axilla and Supraclavicular gegion: 4500 cGy/25 Fractions with boost for a total of 6300 cGy  . Sleep apnea sleep study 09/18/2005   uses CPAP occ. - to bring machine DOS  . Spondylosis    cervical and lumbar  . Use of letrozole (Femara) start 07/21/12    Surgeries: Procedure(s): RIGHT TOTAL HIP ARTHROPLASTY ANTERIOR APPROACH on 03/17/2018   Consultants (if any):   Discharged Condition: Improved  Hospital Course: [name] is an 77 y.o. female who was admitted 03/17/2018 with a diagnosis of Osteoarthritis of right hip and went to the operating room on 03/17/2018 and underwent the above named procedures.    She was given perioperative antibiotics:  Anti-infectives (From admission, onward)   Start     Dose/Rate Route Frequency Ordered Stop   03/17/18 1800  ceFAZolin  (ANCEF) IVPB 2g/100 mL premix     2 g 200 mL/hr over 30 Minutes Intravenous Every 6 hours 03/17/18 1626 03/18/18 0046   03/17/18 1027  ceFAZolin (ANCEF) IVPB 2g/100 mL premix  Status:  Discontinued     2 g 200 mL/hr over 30 Minutes Intravenous On call to O.R. 03/17/18 1027 03/17/18 1624    .  She was given sequential compression devices, early ambulation, and ASA for DVT prophylaxis.  She benefited maximally from the hospital stay and there were no complications.    Recent vital signs:  Vitals:   03/18/18 1007 03/18/18 1324  BP: (!) 129/59 (!) 107/48  Pulse: 76 65  Resp: 16 16  Temp: 98.8 F (37.1 C) (!) 97.5 F (36.4 C)  SpO2: 100% 100%    Recent laboratory studies:  Lab Results  Component Value Date   HGB 11.4 (L) 03/18/2018   HGB 14.2 03/08/2018   HGB 12.2 12/17/2014   Lab Results  Component Value Date   WBC 9.9 03/18/2018   PLT 242 03/18/2018   Lab Results  Component Value Date   INR 1.02 04/10/2010   Lab Results  Component Value Date   NA 140 03/18/2018   K 3.6 03/18/2018   CL 103 03/18/2018   CO2 28 03/18/2018   BUN 20 03/18/2018   CREATININE  0.84 03/18/2018   GLUCOSE 150 (H) 03/18/2018    Discharge Medications:   Allergies as of 03/18/2018      Reactions   Crestor [rosuvastatin] Other (See Comments)   crampin of legs and feet--pt is currently taking.   Lactose Intolerance (gi) Other (See Comments)   ABD. CRAMPS   Lipitor [atorvastatin Calcium] Other (See Comments)   LEG CRAMPS   Zocor [simvastatin - High Dose] Other (See Comments)   Leg cramps      Medication List    STOP taking these medications   aspirin 81 MG tablet Replaced by:  aspirin 81 MG chewable tablet   methotrexate 250 MG/10ML injection   traMADol 50 MG tablet Commonly known as:  ULTRAM   TYLENOL 8 HOUR ARTHRITIS PAIN 650 MG CR tablet Generic drug:  acetaminophen     TAKE these medications   aspirin 81 MG chewable tablet Chew 1 tablet (81 mg total) by mouth 2 (two)  times daily. Replaces:  aspirin 81 MG tablet   diclofenac sodium 1 % Gel Commonly known as:  VOLTAREN Apply 2-4 g topically 4 (four) times daily as needed. For joint pain.   diltiazem 240 MG 24 hr capsule Commonly known as:  CARDIZEM CD Take 240 mg by mouth daily.   docusate sodium 100 MG capsule Commonly known as:  COLACE Take 1 capsule (100 mg total) by mouth 2 (two) times daily.   ezetimibe 10 MG tablet Commonly known as:  ZETIA Take 10 mg by mouth every evening.   Fluticasone-Salmeterol 100-50 MCG/DOSE Aepb Commonly known as:  ADVAIR Inhale 1 puff into the lungs daily.   folic acid 1 MG tablet Commonly known as:  FOLVITE Take 1 mg by mouth daily.   HYDROcodone-acetaminophen 5-325 MG tablet Commonly known as:  NORCO/VICODIN Take 1-2 tablets by mouth every 4 (four) hours as needed.   letrozole 2.5 MG tablet Commonly known as:  FEMARA Take 1 tablet (2.5 mg total) by mouth daily.   levothyroxine 75 MCG tablet Commonly known as:  SYNTHROID, LEVOTHROID Take 75 mcg by mouth daily before breakfast.   ondansetron 4 MG tablet Commonly known as:  ZOFRAN Take 1 tablet (4 mg total) by mouth every 6 (six) hours as needed for nausea.   ranitidine 150 MG tablet Commonly known as:  ZANTAC Take 150 mg by mouth See admin instructions. Take 1 tablet (150 mg) by mouth scheduled in the morning, may take an additional tablet in the evening if needed.   rosuvastatin 10 MG tablet Commonly known as:  CRESTOR Take 1 tablet (10 mg total) by mouth 3 (three) times a week. Mondays, Wednesdays & Fridays.   SPIRIVA RESPIMAT 1.25 MCG/ACT Aers Generic drug:  Tiotropium Bromide Monohydrate Inhale 2.5 mcg into the lungs daily as needed (for respiratory issues.).   triamterene-hydrochlorothiazide 37.5-25 MG tablet Commonly known as:  MAXZIDE-25 Take 1 tablet by mouth daily.   Vitamin D-3 5000 units Tabs Take 5,000 Units by mouth daily.       Diagnostic Studies: Dg Pelvis  Portable  Result Date: 03/17/2018 CLINICAL DATA:  Right hip replacement EXAM: PORTABLE PELVIS 1-2 VIEWS COMPARISON:  None. FINDINGS: Right hip replacement satisfactory position alignment. No fracture or complication. IMPRESSION: Satisfactory right hip replacement. Electronically Signed   By: Marlan Palau M.D.   On: 03/17/2018 14:29   Dg C-arm 1-60 Min-no Report  Result Date: 03/17/2018 Fluoroscopy was utilized by the requesting physician.  No radiographic interpretation.   Dg Hip Operative Unilat W Or W/o Pelvis  Right  Result Date: 03/17/2018 CLINICAL DATA:  Right hip surgery. EXAM: OPERATIVE RIGHT HIP (WITH PELVIS IF PERFORMED) 6 VIEWS TECHNIQUE: Fluoroscopic spot image(s) were submitted for interpretation post-operatively. COMPARISON:  No recent prior. FINDINGS: Total right hip replacement.  Hardware intact.  Anatomic alignment. IMPRESSION: Total right hip replacement anatomic alignment. Electronically Signed   By: Maisie Fus  Register   On: 03/17/2018 13:23    Disposition: Discharge disposition: 01-Home or Self Care       Discharge Instructions    Call MD / Call 911   Complete by:  As directed    If you experience chest pain or shortness of breath, CALL 911 and be transported to the hospital emergency room.  If you develope a fever above 101 F, pus (white drainage) or increased drainage or redness at the wound, or calf pain, call your surgeon's office.   Constipation Prevention   Complete by:  As directed    Drink plenty of fluids.  Prune juice may be helpful.  You may use a stool softener, such as Colace (over the counter) 100 mg twice a day.  Use MiraLax (over the counter) for constipation as needed.   Diet - low sodium heart healthy   Complete by:  As directed    Discharge instructions   Complete by:  As directed    Do not resume methotrexate until cleared at postop visit.   Driving restrictions   Complete by:  As directed    No driving for 6 weeks   Increase activity slowly as  tolerated   Complete by:  As directed    Lifting restrictions   Complete by:  As directed    No lifting for 6 weeks   TED hose   Complete by:  As directed    Use stockings (TED hose) for 2 weeks on both leg(s).  You may remove them at night for sleeping.      Follow-up Information    Rasean Joos, Arlys John, MD. Schedule an appointment as soon as possible for a visit in 2 weeks.   Specialty:  Orthopedic Surgery Why:  For wound re-check Contact information: [address] [place] [address] [phone]            Signed: Iline Oven Constantino Starace 03/18/2018, 7:09 PM

## 2018-03-18 NOTE — Evaluation (Signed)
Physical Therapy Evaluation Patient Details Name: [name] MRN: [id number] DOB: 1941/03/17 Today's Date: 03/18/2018   History of Present Illness  Pt is a 77 year old female s/p right direct anterior THA  Clinical Impression  Pt is s/p THA resulting in the deficits listed below (see PT Problem List).  Pt will benefit from skilled PT to increase their independence and safety with mobility to allow discharge to the venue listed below.  Pt assisted with ambulating and performing exercises POD #1 and plans to d/c home later today.  Will return to ambulate again and practice steps.     Follow Up Recommendations Follow surgeon's recommendation for DC plan and follow-up therapies    Equipment Recommendations  None recommended by PT    Recommendations for Other Services       Precautions / Restrictions Precautions Precautions: None Restrictions Other Position/Activity Restrictions: WBAT      Mobility  Bed Mobility Overal bed mobility: Needs Assistance Bed Mobility: Supine to Sit     Supine to sit: HOB elevated;Supervision        Transfers Overall transfer level: Needs assistance Equipment used: Rolling walker (2 wheeled) Transfers: Sit to/from Stand Sit to Stand: Min guard         General transfer comment: verbal cues for safe technique  Ambulation/Gait Ambulation/Gait assistance: Min guard Ambulation Distance (Feet): 160 Feet Assistive device: Rolling walker (2 wheeled) Gait Pattern/deviations: Step-through pattern;Decreased stance time - right;Antalgic     General Gait Details: verbal cues for sequence, RW positioning, step length, posture  Stairs            Wheelchair Mobility    Modified Rankin (Stroke Patients Only)       Balance                                             Pertinent Vitals/Pain Pain Assessment: 0-10 Pain Score: 2  Pain Location: r hip Pain Descriptors / Indicators: Sore;Aching Pain Intervention(s):  Limited activity within patient's tolerance;Repositioned;Monitored during session    Home Living Family/patient expects to be discharged to:: Private residence Living Arrangements: Other relatives Available Help at Discharge: Family Type of Home: House Home Access: Stairs to enter   Technical brewer of Steps: 5 Home Layout: One level Home Equipment: Environmental consultant - 2 wheels;Bedside commode Additional Comments: pt plans to d/c to daughter's home - has BSC and RW already at daughter's    Prior Function Level of Independence: Independent with assistive device(s)               Hand Dominance        Extremity/Trunk Assessment        Lower Extremity Assessment Lower Extremity Assessment: RLE deficits/detail RLE Deficits / Details: observed anticipated post op hip weakness, grossly at least 2+/5 throughout       Communication   Communication: No difficulties  Cognition Arousal/Alertness: Awake/alert Behavior During Therapy: WFL for tasks assessed/performed Overall Cognitive Status: Within Functional Limits for tasks assessed                                        General Comments      Exercises Total Joint Exercises Ankle Circles/Pumps: AROM;10 reps;Standing;Both Hip ABduction/ADduction: AROM;10 reps;Standing;Right Long Arc Quad: AROM;10 reps;Seated;Right Knee Flexion: AROM;10 reps;Right;Standing Marching  in Standing: AROM;10 reps;Right;Standing Standing Hip Extension: AROM;10 reps;Standing;Right   Assessment/Plan    PT Assessment Patient needs continued PT services  PT Problem List Decreased strength;Decreased mobility;Decreased activity tolerance;Decreased knowledge of use of DME;Pain;Decreased range of motion       PT Treatment Interventions Stair training;Functional mobility training;Gait training;Therapeutic exercise;Patient/family education;Therapeutic activities;DME instruction    PT Goals (Current goals can be found in the Care Plan  section)  Acute Rehab PT Goals PT Goal Formulation: With patient Time For Goal Achievement: 03/22/18 Potential to Achieve Goals: Good    Frequency 7X/week   Barriers to discharge        Co-evaluation               AM-PAC PT "6 Clicks" Daily Activity  Outcome Measure Difficulty turning over in bed (including adjusting bedclothes, sheets and blankets)?: A Little Difficulty moving from lying on back to sitting on the side of the bed? : A Little Difficulty sitting down on and standing up from a chair with arms (e.g., wheelchair, bedside commode, etc,.)?: A Little Help needed moving to and from a bed to chair (including a wheelchair)?: A Little Help needed walking in hospital room?: A Little Help needed climbing 3-5 steps with a railing? : A Little 6 Click Score: 18    End of Session Equipment Utilized During Treatment: Gait belt Activity Tolerance: Patient tolerated treatment well Patient left: in chair;with call bell/phone within reach Nurse Communication: Mobility status PT Visit Diagnosis: Difficulty in walking, not elsewhere classified (R26.2)    Time: 5038-8828 PT Time Calculation (min) (ACUTE ONLY): 23 min   Charges:   PT Evaluation $PT Eval Low Complexity: 1 Low PT Treatments $Therapeutic Exercise: 8-22 mins   PT G Codes:       Carmelia Bake, PT, DPT 03/18/2018 Pager: [phone]  York Ram E 03/18/2018, 12:27 PM

## 2018-03-18 NOTE — Plan of Care (Signed)
Good knowledge base, plan of care discussed.

## 2018-03-18 NOTE — Progress Notes (Signed)
Physical Therapy Treatment Patient Details Name: [name] MRN: [id number] DOB: 1941-05-08 Today's Date: 03/18/2018    History of Present Illness Pt is a 77 year old female s/p right direct anterior THA    PT Comments    Pt assisted into bathroom, ambulated in hallway and practiced safe stair technique.  Pt reports understanding and plans to d/c home later today.   Follow Up Recommendations  Follow surgeon's recommendation for DC plan and follow-up therapies     Equipment Recommendations  None recommended by PT    Recommendations for Other Services       Precautions / Restrictions Precautions Precautions: None Restrictions Other Position/Activity Restrictions: WBAT    Mobility  Bed Mobility Overal bed mobility: Needs Assistance Bed Mobility: Supine to Sit     Supine to sit: HOB elevated;Supervision        Transfers Overall transfer level: Needs assistance Equipment used: Rolling walker (2 wheeled) Transfers: Sit to/from Stand Sit to Stand: Min guard         General transfer comment: verbal cues for safe technique  Ambulation/Gait Ambulation/Gait assistance: Min guard Ambulation Distance (Feet): 100 Feet Assistive device: Rolling walker (2 wheeled) Gait Pattern/deviations: Step-to pattern;Decreased stance time - right;Antalgic     General Gait Details: verbal cues for sequence, RW positioning, step length, posture   Stairs Stairs: Yes   Stair Management: Step to pattern;Forwards;Two rails Number of Stairs: 3 General stair comments: verbal cues for sequence and safety, pt also with "bad" L knee so her surgical leg may be stronger/better then her unaffected leg, discussed "up with the good, down with the bad", pt performed with one instance of L LE buckling however able to self correct.  Wheelchair Mobility    Modified Rankin (Stroke Patients Only)       Balance                                            Cognition  Arousal/Alertness: Awake/alert Behavior During Therapy: WFL for tasks assessed/performed Overall Cognitive Status: Within Functional Limits for tasks assessed                                        Exercises     General Comments        Pertinent Vitals/Pain Pain Assessment: 0-10 Pain Score: 1  Pain Location: r hip Pain Descriptors / Indicators: Sore;Aching Pain Intervention(s): Limited activity within patient's tolerance;Repositioned;Monitored during session;Ice applied    Home Living Family/patient expects to be discharged to:: Private residence Living Arrangements: Other relatives Available Help at Discharge: Family Type of Home: House Home Access: Stairs to enter   Home Layout: One level Home Equipment: Environmental consultant - 2 wheels;Bedside commode Additional Comments: pt plans to d/c to daughter's home - has BSC and RW already at daughter's    Prior Function Level of Independence: Independent with assistive device(s)          PT Goals (current goals can now be found in the care plan section) Acute Rehab PT Goals PT Goal Formulation: With patient Time For Goal Achievement: 03/22/18 Potential to Achieve Goals: Good Progress towards PT goals: Progressing toward goals    Frequency    7X/week      PT Plan Current plan remains appropriate    Co-evaluation  AM-PAC PT "6 Clicks" Daily Activity  Outcome Measure  Difficulty turning over in bed (including adjusting bedclothes, sheets and blankets)?: A Little Difficulty moving from lying on back to sitting on the side of the bed? : A Little Difficulty sitting down on and standing up from a chair with arms (e.g., wheelchair, bedside commode, etc,.)?: A Little Help needed moving to and from a bed to chair (including a wheelchair)?: A Little Help needed walking in hospital room?: A Little Help needed climbing 3-5 steps with a railing? : A Little 6 Click Score: 18    End of Session Equipment  Utilized During Treatment: Gait belt Activity Tolerance: Patient tolerated treatment well Patient left: in chair;with call bell/phone within reach Nurse Communication: Mobility status PT Visit Diagnosis: Difficulty in walking, not elsewhere classified (R26.2)     Time: 8676-7209 PT Time Calculation (min) (ACUTE ONLY): 16 min  Charges:  $Gait Training: 8-22 mins                     G Codes:      Carmelia Bake, PT, DPT 03/18/2018 Pager: [phone]  York Ram E 03/18/2018, 2:28 PM

## 2018-03-18 NOTE — Progress Notes (Signed)
    Subjective:  Patient reports pain as mild to moderate.  Denies N/V/CP/SOB. Eager to go home.  Objective:   VITALS:   Vitals:   03/17/18 2119 03/17/18 2141 03/18/18 0111 03/18/18 0529  BP: (!) 113/52  (!) 113/57 121/61  Pulse: 96 92 68 70  Resp: 15 16 18 17   Temp: 98.1 F (36.7 C)  98 F (36.7 C) 98.1 F (36.7 C)  TempSrc: Oral  Oral Oral  SpO2: 99% 100% 100% 100%  Weight:      Height:        NAD ABD soft Sensation intact distally Intact pulses distally Dorsiflexion/Plantar flexion intact Incision: dressing C/D/I Compartment soft   Lab Results  Component Value Date   WBC 9.9 03/18/2018   HGB 11.4 (L) 03/18/2018   HCT 33.7 (L) 03/18/2018   MCV 89.6 03/18/2018   PLT 242 03/18/2018   BMET    Component Value Date/Time   NA 140 03/18/2018 0457   NA 142 12/17/2014 1039   K 3.6 03/18/2018 0457   K 3.5 12/17/2014 1039   CL 103 03/18/2018 0457   CL 101 04/24/2013 0904   CO2 28 03/18/2018 0457   CO2 31 (H) 12/17/2014 1039   GLUCOSE 150 (H) 03/18/2018 0457   GLUCOSE 108 12/17/2014 1039   GLUCOSE 116 (H) 04/24/2013 0904   BUN 20 03/18/2018 0457   BUN 18.3 12/17/2014 1039   CREATININE 0.84 03/18/2018 0457   CREATININE 0.98 02/22/2015 0935   CREATININE 0.9 12/17/2014 1039   CALCIUM 9.1 03/18/2018 0457   CALCIUM 10.2 12/17/2014 1039   GFRNONAA >60 03/18/2018 0457   GFRAA >60 03/18/2018 0457     Assessment/Plan: 1 Day Post-Op   Principal Problem:   Osteoarthritis of right hip Active Problems:   Primary osteoarthritis of right hip   WBAT with walker DVT ppx: ASA, SCDs, TEDS PO pain control PT/OT Dispo: D/C home with HEP    Hilton Cork Briar Sword 03/18/2018, 7:20 AM   Rod Can, MD Cell [phone]

## 2018-03-21 ENCOUNTER — Encounter (HOSPITAL_COMMUNITY): Payer: Self-pay | Admitting: Orthopedic Surgery

## 2018-04-01 DIAGNOSIS — Z96641 Presence of right artificial hip joint: Secondary | ICD-10-CM | POA: Diagnosis not present

## 2018-04-01 DIAGNOSIS — Z471 Aftercare following joint replacement surgery: Secondary | ICD-10-CM | POA: Diagnosis not present

## 2018-04-21 DIAGNOSIS — Z961 Presence of intraocular lens: Secondary | ICD-10-CM | POA: Diagnosis not present

## 2018-04-21 DIAGNOSIS — E119 Type 2 diabetes mellitus without complications: Secondary | ICD-10-CM | POA: Diagnosis not present

## 2018-04-21 DIAGNOSIS — H5213 Myopia, bilateral: Secondary | ICD-10-CM | POA: Diagnosis not present

## 2018-04-21 DIAGNOSIS — E0590 Thyrotoxicosis, unspecified without thyrotoxic crisis or storm: Secondary | ICD-10-CM | POA: Diagnosis not present

## 2018-05-02 ENCOUNTER — Ambulatory Visit: Payer: PPO | Admitting: Cardiovascular Disease

## 2018-05-10 DIAGNOSIS — Z471 Aftercare following joint replacement surgery: Secondary | ICD-10-CM | POA: Diagnosis not present

## 2018-05-10 DIAGNOSIS — Z96641 Presence of right artificial hip joint: Secondary | ICD-10-CM | POA: Diagnosis not present

## 2018-05-26 DIAGNOSIS — M0609 Rheumatoid arthritis without rheumatoid factor, multiple sites: Secondary | ICD-10-CM | POA: Diagnosis not present

## 2018-05-26 DIAGNOSIS — M25559 Pain in unspecified hip: Secondary | ICD-10-CM | POA: Diagnosis not present

## 2018-05-26 DIAGNOSIS — M549 Dorsalgia, unspecified: Secondary | ICD-10-CM | POA: Diagnosis not present

## 2018-05-26 DIAGNOSIS — Z79899 Other long term (current) drug therapy: Secondary | ICD-10-CM | POA: Diagnosis not present

## 2018-06-13 DIAGNOSIS — E559 Vitamin D deficiency, unspecified: Secondary | ICD-10-CM | POA: Diagnosis not present

## 2018-06-13 DIAGNOSIS — E119 Type 2 diabetes mellitus without complications: Secondary | ICD-10-CM | POA: Diagnosis not present

## 2018-06-13 DIAGNOSIS — E7849 Other hyperlipidemia: Secondary | ICD-10-CM | POA: Diagnosis not present

## 2018-06-13 DIAGNOSIS — E038 Other specified hypothyroidism: Secondary | ICD-10-CM | POA: Diagnosis not present

## 2018-06-20 DIAGNOSIS — M542 Cervicalgia: Secondary | ICD-10-CM | POA: Diagnosis not present

## 2018-06-20 DIAGNOSIS — Z0000 Encounter for general adult medical examination without abnormal findings: Secondary | ICD-10-CM | POA: Diagnosis not present

## 2018-06-20 DIAGNOSIS — R700 Elevated erythrocyte sedimentation rate: Secondary | ICD-10-CM | POA: Diagnosis not present

## 2018-06-20 DIAGNOSIS — I2510 Atherosclerotic heart disease of native coronary artery without angina pectoris: Secondary | ICD-10-CM | POA: Diagnosis not present

## 2018-06-20 DIAGNOSIS — Z1389 Encounter for screening for other disorder: Secondary | ICD-10-CM | POA: Diagnosis not present

## 2018-06-20 DIAGNOSIS — E038 Other specified hypothyroidism: Secondary | ICD-10-CM | POA: Diagnosis not present

## 2018-06-20 DIAGNOSIS — Z6836 Body mass index (BMI) 36.0-36.9, adult: Secondary | ICD-10-CM | POA: Diagnosis not present

## 2018-06-20 DIAGNOSIS — E1169 Type 2 diabetes mellitus with other specified complication: Secondary | ICD-10-CM | POA: Diagnosis not present

## 2018-06-20 DIAGNOSIS — C50919 Malignant neoplasm of unspecified site of unspecified female breast: Secondary | ICD-10-CM | POA: Diagnosis not present

## 2018-06-20 DIAGNOSIS — J45998 Other asthma: Secondary | ICD-10-CM | POA: Diagnosis not present

## 2018-06-20 DIAGNOSIS — E7849 Other hyperlipidemia: Secondary | ICD-10-CM | POA: Diagnosis not present

## 2018-06-20 DIAGNOSIS — H6121 Impacted cerumen, right ear: Secondary | ICD-10-CM | POA: Diagnosis not present

## 2018-06-21 ENCOUNTER — Encounter: Payer: Self-pay | Admitting: Gastroenterology

## 2018-06-29 ENCOUNTER — Telehealth: Payer: Self-pay | Admitting: Hematology and Oncology

## 2018-06-29 NOTE — Telephone Encounter (Signed)
Tried to reach regarding date change °

## 2018-07-13 DIAGNOSIS — E559 Vitamin D deficiency, unspecified: Secondary | ICD-10-CM | POA: Diagnosis not present

## 2018-07-27 ENCOUNTER — Ambulatory Visit: Payer: PPO | Admitting: Hematology and Oncology

## 2018-07-27 ENCOUNTER — Other Ambulatory Visit: Payer: PPO

## 2018-08-03 ENCOUNTER — Other Ambulatory Visit: Payer: Self-pay | Admitting: Hematology and Oncology

## 2018-08-03 ENCOUNTER — Inpatient Hospital Stay: Payer: PPO | Attending: Hematology and Oncology | Admitting: Hematology and Oncology

## 2018-08-03 ENCOUNTER — Telehealth: Payer: Self-pay | Admitting: Hematology and Oncology

## 2018-08-03 DIAGNOSIS — C50411 Malignant neoplasm of upper-outer quadrant of right female breast: Secondary | ICD-10-CM

## 2018-08-03 DIAGNOSIS — N6320 Unspecified lump in the left breast, unspecified quadrant: Secondary | ICD-10-CM

## 2018-08-03 DIAGNOSIS — Z9221 Personal history of antineoplastic chemotherapy: Secondary | ICD-10-CM | POA: Insufficient documentation

## 2018-08-03 DIAGNOSIS — Z170 Estrogen receptor positive status [ER+]: Secondary | ICD-10-CM

## 2018-08-03 DIAGNOSIS — Z79811 Long term (current) use of aromatase inhibitors: Secondary | ICD-10-CM

## 2018-08-03 MED ORDER — LETROZOLE 2.5 MG PO TABS: 2.5000 mg | ORAL_TABLET | Freq: Every day | ORAL | 3 refills | Status: DC

## 2018-08-03 NOTE — Progress Notes (Signed)
Patient Care Team: [name], MD as PCP - General (Internal Medicine) Sanda Klein, MD as PCP - Cardiology (Cardiology)  DIAGNOSIS:  Encounter Diagnosis  Name Primary?  . Malignant neoplasm of upper-outer quadrant of right breast in female, estrogen receptor positive ([place])     CHIEF COMPLIANT: Follow-up on letrozole therapy  INTERVAL HISTORY: [name] is a 77 year old with above-mentioned history of right breast cancer currently on letrozole having completed 5 years of letrozole therapy.  She appears to be tolerating it fairly well.  She had a right hip arthroplasty which is helping her but she needs a left knee surgery as well.  Denies any lumps or nodules in the breast.  REVIEW OF SYSTEMS:   Constitutional: Denies fevers, chills or abnormal weight loss Eyes: Denies blurriness of vision Ears, nose, mouth, throat, and face: Denies mucositis or sore throat Respiratory: Denies cough, dyspnea or wheezes Cardiovascular: Denies palpitation, chest discomfort Gastrointestinal:  Denies nausea, heartburn or change in bowel habits Skin: Denies abnormal skin rashes Lymphatics: Denies new lymphadenopathy or easy bruising Neurological:Denies numbness, tingling or new weaknesses Behavioral/Psych: Mood is stable, no new changes  Extremities: No lower extremity edema Breast:  denies any pain or lumps or nodules in either breasts All other systems were reviewed with the patient and are negative.  I have reviewed the past medical history, past surgical history, social history and family history with the patient and they are unchanged from previous note.  ALLERGIES:  is allergic to crestor [rosuvastatin]; lactose intolerance (gi); lipitor [atorvastatin calcium]; and zocor [simvastatin - high dose].  MEDICATIONS:  Current Outpatient Medications  Medication Sig Dispense Refill  . aspirin 81 MG chewable tablet Chew 1 tablet (81 mg total) by mouth 2 (two) times daily. 60 tablet 1  .  Cholecalciferol (VITAMIN D-3) 5000 units TABS Take 5,000 Units by mouth daily.    . diclofenac sodium (VOLTAREN) 1 % GEL Apply 2-4 g topically 4 (four) times daily as needed. For joint pain.  3  . diltiazem (CARDIZEM CD) 240 MG 24 hr capsule Take 240 mg by mouth daily.  3  . docusate sodium (COLACE) 100 MG capsule Take 1 capsule (100 mg total) by mouth 2 (two) times daily. 60 capsule 1  . ezetimibe (ZETIA) 10 MG tablet Take 10 mg by mouth every evening.     . Fluticasone-Salmeterol (ADVAIR) 100-50 MCG/DOSE AEPB Inhale 1 puff into the lungs daily.     . folic acid (FOLVITE) 1 MG tablet Take 1 mg by mouth daily.    Marland Kitchen HYDROcodone-acetaminophen (NORCO/VICODIN) 5-325 MG tablet Take 1-2 tablets by mouth every 4 (four) hours as needed. 50 tablet 0  . letrozole (FEMARA) 2.5 MG tablet Take 1 tablet (2.5 mg total) by mouth daily. 90 tablet 3  . levothyroxine (SYNTHROID, LEVOTHROID) 75 MCG tablet Take 75 mcg by mouth daily before breakfast.   2  . ondansetron (ZOFRAN) 4 MG tablet Take 1 tablet (4 mg total) by mouth every 6 (six) hours as needed for nausea. 20 tablet 0  . ranitidine (ZANTAC) 150 MG tablet Take 150 mg by mouth See admin instructions. Take 1 tablet (150 mg) by mouth scheduled in the morning, may take an additional tablet in the evening if needed.    . rosuvastatin (CRESTOR) 10 MG tablet Take 1 tablet (10 mg total) by mouth 3 (three) times a week. Mondays, Wednesdays & Fridays. 40 tablet 3  . Tiotropium Bromide Monohydrate (SPIRIVA RESPIMAT) 1.25 MCG/ACT AERS Inhale 2.5 mcg into the lungs  daily as needed (for respiratory issues.).    Marland Kitchen triamterene-hydrochlorothiazide (MAXZIDE-25) 37.5-25 MG per tablet Take 1 tablet by mouth daily.      No current facility-administered medications for this visit.     PHYSICAL EXAMINATION: ECOG PERFORMANCE STATUS: 1 - Symptomatic but completely ambulatory  Vitals:   08/03/18 0933  BP: (!) 152/74  Pulse: 79  Resp: 19  Temp: 97.8 F (36.6 C)  SpO2: 98%    Filed Weights   08/03/18 0933  Weight: 222 lb 3.2 oz (100.8 kg)    GENERAL:alert, no distress and comfortable SKIN: skin color, texture, turgor are normal, no rashes or significant lesions EYES: normal, Conjunctiva are pink and non-injected, sclera clear OROPHARYNX:no exudate, no erythema and lips, buccal mucosa, and tongue normal  NECK: supple, thyroid normal size, non-tender, without nodularity LYMPH:  no palpable lymphadenopathy in the cervical, axillary or inguinal LUNGS: clear to auscultation and percussion with normal breathing effort HEART: regular rate & rhythm and no murmurs and no lower extremity edema ABDOMEN:abdomen soft, non-tender and normal bowel sounds MUSCULOSKELETAL:no cyanosis of digits and no clubbing  NEURO: alert & oriented x 3 with fluent speech, no focal motor/sensory deficits EXTREMITIES: No lower extremity edema BREAST: No palpable masses or nodules in either right or left breasts. No palpable axillary supraclavicular or infraclavicular adenopathy no breast tenderness or nipple discharge. (exam performed in the presence of a chaperone)  LABORATORY DATA:  I have reviewed the data as listed CMP Latest Ref Rng & Units 03/18/2018 03/08/2018 02/22/2015  Glucose 65 - 99 mg/dL 150(H) 118(H) 114(H)  BUN 6 - 20 mg/dL 20 23(H) 15  Creatinine 0.44 - 1.00 mg/dL 0.84 1.07(H) 0.98  Sodium 135 - 145 mmol/L 140 140 141  Potassium 3.5 - 5.1 mmol/L 3.6 3.9 3.8  Chloride 101 - 111 mmol/L 103 101 101  CO2 22 - 32 mmol/L '28 29 31  ' Calcium 8.9 - 10.3 mg/dL 9.1 10.3 10.2  Total Protein 6.0 - 8.3 g/dL - - 7.1  Total Bilirubin 0.2 - 1.2 mg/dL - - 0.5  Alkaline Phos 39 - 117 U/L - - 71  AST 0 - 37 U/L - - 18  ALT 0 - 35 U/L - - 19    Lab Results  Component Value Date   WBC 9.9 03/18/2018   HGB 11.4 (L) 03/18/2018   HCT 33.7 (L) 03/18/2018   MCV 89.6 03/18/2018   PLT 242 03/18/2018   NEUTROABS 2.5 12/17/2014    ASSESSMENT & PLAN:  Malignant neoplasm of upper-outer  quadrant of female breast Right breast invasive ductal carcinoma with DCIS 2.5 cm with the MRI showing a maximum diameter of 7.5 cm ER/PR positive HER-2 negative Ki-67 15% treated with neoadjuvant chemotherapy with Taxotere and Cytoxan 12/02/2011 4 followed by surgery with lumpectomy which showed IDC with lymphovascular invasion 1.9 cm, 2 sentinel lymph nodes negative, followed by radiation therapy and now on antiestrogen therapy with letrozole 2.5 mg daily from 07/21/2012  Letrozole toxicities: Occasional hot flashes Letrozole to be completed August 2020  03/17/2018: Left hip arthroplasty  Breast cancer surveillance: 1. Breast exam 8/14/2019right breast prior surgical changes and firmness underneath the scar tissue which is normal postsurgical change. 2. Mammogram 03/09/2017 normal, breast density category B.  She will need a mammogram this year. I asked her to call the breast center and get the mammogram done as soon as possible.  Return to clinic in 1 year for follow-up  No orders of the defined types were placed in this encounter.  The patient has a good understanding of the overall plan. she agrees with it. she will call with any problems that may develop before the next visit here.   Harriette Ohara, MD 08/03/18

## 2018-08-03 NOTE — Assessment & Plan Note (Signed)
Right breast invasive ductal carcinoma with DCIS 2.5 cm with the MRI showing a maximum diameter of 7.5 cm ER/PR positive HER-2 negative Ki-67 15% treated with neoadjuvant chemotherapy with Taxotere and Cytoxan 12/02/2011 4 followed by surgery with lumpectomy which showed IDC with lymphovascular invasion 1.9 cm, 2 sentinel lymph nodes negative, followed by radiation therapy and now on antiestrogen therapy with letrozole 2.5 mg daily from 07/21/2012  Letrozole toxicities: Occasional hot flashes Letrozole to be completed August 2020  03/17/2018: Left hip arthroplasty  Breast cancer surveillance: 1. Breast exam 8/14/2019normal 2. Mammogram 03/09/2017 normal, breast density category B.  She will need a mammogram this year.  Return to clinic in 1 year for follow-up

## 2018-08-03 NOTE — Telephone Encounter (Signed)
Gave avs and calendar ° °

## 2018-08-05 ENCOUNTER — Other Ambulatory Visit: Payer: Self-pay | Admitting: Adult Health

## 2018-08-05 ENCOUNTER — Other Ambulatory Visit: Payer: Self-pay

## 2018-08-05 ENCOUNTER — Other Ambulatory Visit: Payer: Self-pay | Admitting: Hematology and Oncology

## 2018-08-05 DIAGNOSIS — Z170 Estrogen receptor positive status [ER+]: Principal | ICD-10-CM

## 2018-08-05 DIAGNOSIS — N6320 Unspecified lump in the left breast, unspecified quadrant: Secondary | ICD-10-CM

## 2018-08-05 DIAGNOSIS — C50411 Malignant neoplasm of upper-outer quadrant of right female breast: Secondary | ICD-10-CM

## 2018-08-08 ENCOUNTER — Ambulatory Visit
Admission: RE | Admit: 2018-08-08 | Discharge: 2018-08-08 | Disposition: A | Discharge status: Home / Self Care | Payer: PPO | Source: Ambulatory Visit | Attending: Hematology and Oncology | Admitting: Hematology and Oncology

## 2018-08-08 ENCOUNTER — Ambulatory Visit: Payer: PPO

## 2018-08-08 ENCOUNTER — Other Ambulatory Visit: Payer: Self-pay | Admitting: Hematology and Oncology

## 2018-08-08 DIAGNOSIS — R921 Mammographic calcification found on diagnostic imaging of breast: Secondary | ICD-10-CM

## 2018-08-08 DIAGNOSIS — R928 Other abnormal and inconclusive findings on diagnostic imaging of breast: Secondary | ICD-10-CM | POA: Diagnosis not present

## 2018-08-08 DIAGNOSIS — Z170 Estrogen receptor positive status [ER+]: Principal | ICD-10-CM

## 2018-08-08 DIAGNOSIS — C50411 Malignant neoplasm of upper-outer quadrant of right female breast: Secondary | ICD-10-CM

## 2018-08-08 DIAGNOSIS — Z853 Personal history of malignant neoplasm of breast: Secondary | ICD-10-CM | POA: Diagnosis not present

## 2018-08-11 ENCOUNTER — Ambulatory Visit
Admission: RE | Admit: 2018-08-11 | Discharge: 2018-08-11 | Disposition: A | Discharge status: Home / Self Care | Payer: PPO | Source: Ambulatory Visit | Attending: Hematology and Oncology | Admitting: Hematology and Oncology

## 2018-08-11 DIAGNOSIS — R921 Mammographic calcification found on diagnostic imaging of breast: Secondary | ICD-10-CM | POA: Diagnosis not present

## 2018-08-11 DIAGNOSIS — N6311 Unspecified lump in the right breast, upper outer quadrant: Secondary | ICD-10-CM | POA: Diagnosis not present

## 2018-08-11 HISTORY — PX: BREAST BIOPSY: SHX20

## 2018-08-26 ENCOUNTER — Encounter: Payer: PPO | Admitting: Gastroenterology

## 2018-09-01 DIAGNOSIS — Z79899 Other long term (current) drug therapy: Secondary | ICD-10-CM | POA: Diagnosis not present

## 2018-09-01 DIAGNOSIS — M7552 Bursitis of left shoulder: Secondary | ICD-10-CM | POA: Diagnosis not present

## 2018-09-01 DIAGNOSIS — M0609 Rheumatoid arthritis without rheumatoid factor, multiple sites: Secondary | ICD-10-CM | POA: Diagnosis not present

## 2018-09-01 DIAGNOSIS — M549 Dorsalgia, unspecified: Secondary | ICD-10-CM | POA: Diagnosis not present

## 2018-09-01 DIAGNOSIS — M25559 Pain in unspecified hip: Secondary | ICD-10-CM | POA: Diagnosis not present

## 2018-09-13 DIAGNOSIS — M1611 Unilateral primary osteoarthritis, right hip: Secondary | ICD-10-CM | POA: Diagnosis not present

## 2018-09-14 ENCOUNTER — Other Ambulatory Visit: Payer: Self-pay

## 2018-09-14 ENCOUNTER — Encounter: Payer: Self-pay | Admitting: Gastroenterology

## 2018-09-14 ENCOUNTER — Ambulatory Visit (AMBULATORY_SURGERY_CENTER): Payer: Self-pay

## 2018-09-14 VITALS — Ht 65.0 in | Wt 217.8 lb

## 2018-09-14 DIAGNOSIS — Z86010 Personal history of colonic polyps: Secondary | ICD-10-CM

## 2018-09-14 NOTE — Progress Notes (Signed)
No egg or soy allergy known to patient  No issues with past sedation with any surgeries  or procedures, no intubation problems , pt thinks she may be allergic to iv dye No diet pills per patient No home 02 use per patient  No blood thinners per patient  Pt denies issues with constipation  No A fib or A flutter  EMMI video sent to pt's e mail, pt declined

## 2018-09-26 ENCOUNTER — Ambulatory Visit (AMBULATORY_SURGERY_CENTER): Payer: PPO | Admitting: Gastroenterology

## 2018-09-26 ENCOUNTER — Encounter: Payer: Self-pay | Admitting: Gastroenterology

## 2018-09-26 VITALS — BP 126/82 | HR 61 | Temp 97.8°F | Resp 13 | Ht 65.0 in | Wt 222.0 lb

## 2018-09-26 DIAGNOSIS — D124 Benign neoplasm of descending colon: Secondary | ICD-10-CM

## 2018-09-26 DIAGNOSIS — I2510 Atherosclerotic heart disease of native coronary artery without angina pectoris: Secondary | ICD-10-CM | POA: Diagnosis not present

## 2018-09-26 DIAGNOSIS — E669 Obesity, unspecified: Secondary | ICD-10-CM | POA: Diagnosis not present

## 2018-09-26 DIAGNOSIS — G4733 Obstructive sleep apnea (adult) (pediatric): Secondary | ICD-10-CM | POA: Diagnosis not present

## 2018-09-26 DIAGNOSIS — D122 Benign neoplasm of ascending colon: Secondary | ICD-10-CM | POA: Diagnosis not present

## 2018-09-26 DIAGNOSIS — D123 Benign neoplasm of transverse colon: Secondary | ICD-10-CM

## 2018-09-26 DIAGNOSIS — I10 Essential (primary) hypertension: Secondary | ICD-10-CM | POA: Diagnosis not present

## 2018-09-26 DIAGNOSIS — Z86010 Personal history of colonic polyps: Secondary | ICD-10-CM

## 2018-09-26 DIAGNOSIS — J45909 Unspecified asthma, uncomplicated: Secondary | ICD-10-CM | POA: Diagnosis not present

## 2018-09-26 MED ORDER — SODIUM CHLORIDE 0.9 % IV SOLN: 500.0000 mL | Freq: Once | INTRAVENOUS | Status: DC

## 2018-09-26 NOTE — Progress Notes (Signed)
Pt's states no medical or surgical changes since previsit or office visit. 

## 2018-09-26 NOTE — Progress Notes (Signed)
To recovery, report to RN, VSS. 

## 2018-09-26 NOTE — Progress Notes (Signed)
Called to room to assist during endoscopic procedure.  Patient ID and intended procedure confirmed with present staff. Received instructions for my participation in the procedure from the performing physician.  

## 2018-09-26 NOTE — Op Note (Signed)
[name] Patient Name: [name] Procedure Date: 09/26/2018 1:59 PM MRN: [id number] Endoscopist: Mauri Pole , MD Age: 77 Referring MD:  Date of Birth: 1941/12/17 Gender: Female Account #: [id number] Procedure:                Colonoscopy Indications:              High risk colon cancer surveillance: Personal                            history of adenoma less than 10 mm in size Medicines:                Monitored Anesthesia Care Procedure:                Pre-Anesthesia Assessment:                           - Prior to the procedure, a History and Physical                            was performed, and patient medications and                            allergies were reviewed. The patient's tolerance of                            previous anesthesia was also reviewed. The risks                            and benefits of the procedure and the sedation                            options and risks were discussed with the patient.                            All questions were answered, and informed consent                            was obtained. Prior Anticoagulants: The patient has                            taken no previous anticoagulant or antiplatelet                            agents. ASA Grade Assessment: III - A patient with                            severe systemic disease. After reviewing the risks                            and benefits, the patient was deemed in                            satisfactory condition to undergo the procedure.  After obtaining informed consent, the colonoscope                            was passed under direct vision. Throughout the                            procedure, the patient's blood pressure, pulse, and                            oxygen saturations were monitored continuously. The                            Model PCF-H190DL [id number]) scope was introduced                            through the anus  and advanced to the the cecum,                            identified by appendiceal orifice and ileocecal                            valve. The colonoscopy was performed without                            difficulty. The patient tolerated the procedure                            well. The quality of the bowel preparation was                            adequate. The ileocecal valve, appendiceal orifice,                            and rectum were photographed. Scope In: 2:08:36 PM Scope Out: 2:27:41 PM Scope Withdrawal Time: 0 hours 13 minutes 13 seconds  Total Procedure Duration: 0 hours 19 minutes 5 seconds  Findings:                 The perianal and digital rectal examinations were                            normal.                           Three sessile polyps were found in the ascending                            colon. The polyps were 1 to 2 mm in size. These                            polyps were removed with a cold biopsy forceps.                            Resection and retrieval were complete.  Three sessile polyps were found in the descending                            colon and transverse colon. The polyps were 5 to 8                            mm in size. These polyps were removed with a cold                            snare. Resection and retrieval were complete.                           Multiple small and large-mouthed diverticula were                            found in the sigmoid colon and descending colon.                            There was evidence of diverticular spasm. There was                            evidence of an impacted diverticulum.                           Non-bleeding internal hemorrhoids were found during                            retroflexion. The hemorrhoids were large. Complications:            No immediate complications. Estimated Blood Loss:     Estimated blood loss was minimal. Impression:               - Three 1 to 2  mm polyps in the ascending colon,                            removed with a cold biopsy forceps. Resected and                            retrieved.                           - Three 5 to 8 mm polyps in the descending colon                            and in the transverse colon, removed with a cold                            snare. Resected and retrieved.                           - Moderate diverticulosis in the sigmoid colon and                            in the descending colon. There  was evidence of                            diverticular spasm. There was evidence of an                            impacted diverticulum.                           - Non-bleeding internal hemorrhoids. Recommendation:           - Patient has a contact number available for                            emergencies. The signs and symptoms of potential                            delayed complications were discussed with the                            patient. Return to normal activities tomorrow.                            Written discharge instructions were provided to the                            patient.                           - Resume previous diet.                           - Continue present medications.                           - Await pathology results.                           - Repeat colonoscopy in 3 - 5 years for                            surveillance based on pathology results.                           - For future colonoscopy the patient will require                            an extended preparation. If there are any                            questions, please contact the gastroenterologist.                           - Return to my office at the next available                            appointment. Mauri Pole,  MD 09/26/2018 2:34:56 PM This report has been signed electronically.

## 2018-09-26 NOTE — Patient Instructions (Signed)
Handouts given on polyps, diverticulosis and hemorrhoids. Beth will get you a follow-up appointment to see Dr Silverio Decamp on 3rd floor.   YOU HAD AN ENDOSCOPIC PROCEDURE TODAY AT [facility]:   Refer to the procedure report that was given to you for any specific questions about what was found during the examination.  If the procedure report does not answer your questions, please call your gastroenterologist to clarify.  If you requested that your care partner not be given the details of your procedure findings, then the procedure report has been included in a sealed envelope for you to review at your convenience later.  YOU SHOULD EXPECT: Some feelings of bloating in the abdomen. Passage of more gas than usual.  Walking can help get rid of the air that was put into your GI tract during the procedure and reduce the bloating. If you had a lower endoscopy (such as a colonoscopy or flexible sigmoidoscopy) you may notice spotting of blood in your stool or on the toilet paper. If you underwent a bowel prep for your procedure, you may not have a normal bowel movement for a few days.  Please Note:  You might notice some irritation and congestion in your nose or some drainage.  This is from the oxygen used during your procedure.  There is no need for concern and it should clear up in a day or so.  SYMPTOMS TO REPORT IMMEDIATELY:   Following lower endoscopy (colonoscopy or flexible sigmoidoscopy):  Excessive amounts of blood in the stool  Significant tenderness or worsening of abdominal pains  Swelling of the abdomen that is new, acute  Fever of 100F or higher   For urgent or emergent issues, a gastroenterologist can be reached at any hour by calling [phone].   DIET:  We do recommend a small meal at first, but then you may proceed to your regular diet.  Drink plenty of fluids but you should avoid alcoholic beverages for 24 hours.  ACTIVITY:  You should plan to take it easy for  the rest of today and you should NOT DRIVE or use heavy machinery until tomorrow (because of the sedation medicines used during the test).    FOLLOW UP: Our staff will call the number listed on your records the next business day following your procedure to check on you and address any questions or concerns that you may have regarding the information given to you following your procedure. If we do not reach you, we will leave a message.  However, if you are feeling well and you are not experiencing any problems, there is no need to return our call.  We will assume that you have returned to your regular daily activities without incident.  If any biopsies were taken you will be contacted by phone or by letter within the next 1-3 weeks.  Please call us at [phone] if you have not heard about the biopsies in 3 weeks.    SIGNATURES/CONFIDENTIALITY: You and/or your care partner have signed paperwork which will be entered into your electronic medical record.  These signatures attest to the fact that that the information above on your After Visit Summary has been reviewed and is understood.  Full responsibility of the confidentiality of this discharge information lies with you and/or your care-partner.

## 2018-09-27 ENCOUNTER — Telehealth: Payer: Self-pay | Admitting: *Deleted

## 2018-09-27 ENCOUNTER — Telehealth: Payer: Self-pay

## 2018-09-27 NOTE — Telephone Encounter (Signed)
  Follow up Call-  Call back number 09/26/2018  Post procedure Call Back phone  # [phone]  Permission to leave phone message Yes  Some recent data might be hidden     Patient questions:  Do you have a fever, pain , or abdominal swelling? No. Pain Score  0 *  Have you tolerated food without any problems? Yes.    Have you been able to return to your normal activities? Yes.    Do you have any questions about your discharge instructions: Diet   No. Medications  No. Follow up visit  No.  Do you have questions or concerns about your Care? No.  Actions: * If pain score is 4 or above: No action needed, pain <4.

## 2018-09-27 NOTE — Telephone Encounter (Signed)
  Follow up Call-  Call back number 09/26/2018  Post procedure Call Back phone  # [phone]  Permission to leave phone message Yes  Some recent data might be hidden     Patient questions:  Do you have a fever, pain , or abdominal swelling? No. Pain Score  0 *  Have you tolerated food without any problems? Yes.    Have you been able to return to your normal activities? Yes.    Do you have any questions about your discharge instructions: Diet   No. Medications  No. Follow up visit  No.  Do you have questions or concerns about your Care? No.  Actions: * If pain score is 4 or above: No action needed, pain <4.

## 2018-10-04 ENCOUNTER — Encounter: Payer: Self-pay | Admitting: Gastroenterology

## 2018-10-26 ENCOUNTER — Encounter: Payer: Self-pay | Admitting: Gastroenterology

## 2018-10-26 ENCOUNTER — Ambulatory Visit (INDEPENDENT_AMBULATORY_CARE_PROVIDER_SITE_OTHER): Payer: PPO | Admitting: Gastroenterology

## 2018-10-26 VITALS — BP 118/60 | HR 100 | Ht 65.0 in | Wt 220.2 lb

## 2018-10-26 DIAGNOSIS — R1013 Epigastric pain: Secondary | ICD-10-CM

## 2018-10-26 DIAGNOSIS — M549 Dorsalgia, unspecified: Secondary | ICD-10-CM

## 2018-10-26 DIAGNOSIS — R142 Eructation: Secondary | ICD-10-CM

## 2018-10-26 DIAGNOSIS — M62838 Other muscle spasm: Secondary | ICD-10-CM | POA: Diagnosis not present

## 2018-10-26 NOTE — Progress Notes (Signed)
[name]    [id number]    [birth year]  Primary Care Physician:Perini, Elta Guadeloupe, MD  Referring Physician: [name], MD [address] 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 Complains of discomfort in upper back between her shoulder blades which is sometimes relieved with belching or undoing the band of her bra, laying back and sitting up straight also helps sometimes.  Denies any association with diet or activity.  She has been having this pain intermittently for many years now, is mostly episodic.  Pain lasts for few weeks and then resolve spontaneously. She has history of chronic constipation She has a BM 4-5 times a week.  Denies any loss of appetite, weight loss, nausea, vomiting, dysphagia, odynophagia, abdominal pain, loss of appetite or weight loss.  Abdominal ultrasound: July 2013 No significant abnormality  Colonoscopy September 26, 2018 diverticulosis, internal hemorrhoids and multiple sessile adenomatous polyps.  She was recommended surveillance colonoscopy in 3 years.   Outpatient Encounter Medications as of 10/26/2018  Medication Sig  . aspirin 81 MG chewable tablet Chew 1 tablet (81 mg total) by mouth 2 (two) times daily.  . Cholecalciferol (VITAMIN D-3) 5000 units TABS Take 5,000 Units by mouth daily.  . diclofenac sodium (VOLTAREN) 1 % GEL Apply 2-4 g topically 4 (four) times daily as needed. For joint pain.  Marland Kitchen diltiazem (CARDIZEM CD) 240 MG 24 hr capsule Take 240 mg by mouth daily.  Marland Kitchen docusate sodium (COLACE) 100 MG capsule Take 1 capsule (100 mg total) by mouth 2 (two) times daily.  Marland Kitchen ezetimibe (ZETIA) 10 MG tablet Take 10 mg by mouth every evening.   . Fluticasone-Salmeterol (ADVAIR) 100-50 MCG/DOSE AEPB Inhale 1 puff into the lungs daily.   . folic acid (FOLVITE) 1 MG tablet Take 1 mg by mouth daily.  Marland Kitchen HYDROcodone-acetaminophen  (NORCO/VICODIN) 5-325 MG tablet Take 1-2 tablets by mouth every 4 (four) hours as needed.  Marland Kitchen letrozole (FEMARA) 2.5 MG tablet Take 1 tablet (2.5 mg total) by mouth daily.  Marland Kitchen levothyroxine (SYNTHROID, LEVOTHROID) 75 MCG tablet Take 75 mcg by mouth daily before breakfast.   . Methotrexate (XATMEP PO) methotrexate  . ondansetron (ZOFRAN) 4 MG tablet Take 1 tablet (4 mg total) by mouth every 6 (six) hours as needed for nausea.  . ranitidine (ZANTAC) 150 MG tablet Take 150 mg by mouth See admin instructions. Take 1 tablet (150 mg) by mouth scheduled in the morning, may take an additional tablet in the evening if needed.  . rosuvastatin (CRESTOR) 10 MG tablet Take 1 tablet (10 mg total) by mouth 3 (three) times a week. Mondays, Wednesdays & Fridays.  . Tiotropium Bromide Monohydrate (SPIRIVA RESPIMAT IN) Spiriva with HandiHaler  . Tiotropium Bromide Monohydrate (SPIRIVA RESPIMAT) 1.25 MCG/ACT AERS Inhale 2.5 mcg into the lungs daily as needed (for respiratory issues.).  Marland Kitchen traMADol (ULTRAM) 50 MG tablet Take by mouth every 6 (six) hours as needed.  . triamterene-hydrochlorothiazide (MAXZIDE-25) 37.5-25 MG per tablet Take 1 tablet by mouth daily.    No facility-administered encounter medications on file as of 10/26/2018.     Allergies as of 10/26/2018 - Review Complete 10/26/2018  Allergen Reaction Noted  . Crestor [rosuvastatin] Other (See Comments) 05/31/2013  . Lactose intolerance (gi) Other (See Comments) 03/29/2012  . Lipitor [atorvastatin calcium] Other (See Comments) 03/29/2012  . Zocor [simvastatin - high dose] Other (See Comments) 11/04/2011  Past Medical History:  Diagnosis Date  . Anemia    history of  . Arthritis    osteo - s/p right total knee  . Asthma    daily and prn inhalers  . Boil, axilla   . Breast cancer ([facility]) 10/28/11   right breast  . CAD (coronary artery disease)   . Colon polyp   . GERD (gastroesophageal reflux disease)   . Grave's disease    no current meds.    . H pylori ulcer   . Heart murmur   . History of blood transfusion 2006  . History of chemotherapy    neoadjuvant: 5 cycles of Taxol/carboplatinum:  last dose 02/24/12  . History of hiatal hernia   . Hyperlipidemia   . Hypertension    under control with med.  . Hypothyroid   . Liver cyst    History of   . Pre-diabetes   . RA (rheumatoid arthritis) ([facility])   . S/P chemotherapy, time since 4-12 weeks finished 02/20/2011  . S/P radiation therapy    Right Breast High Axilla and Supraclavicular gegion: 4500 cGy/25 Fractions with boost for a total of 6300 cGy  . Sleep apnea sleep study 09/18/2005   uses CPAP occ. - to bring machine DOS  . Spondylosis    cervical and lumbar  . Use of letrozole (Femara) start 07/21/12    Past Surgical History:  Procedure Laterality Date  . ABDOMINAL HYSTERECTOMY    . BILATERAL OOPHORECTOMY  2009  . Boil removal under arm Right   . BREAST BIOPSY Bilateral 1998  . BREAST LUMPECTOMY Right 03/2012  . CARDIAC CATHETERIZATION  04/10/2010, 05/07/2004   LAD lesion, blood flow OK, per pt.  Marland Kitchen CARDIOVASCULAR STRESS TEST  02/15/2018  . CARPAL TUNNEL RELEASE     left  . CARPAL TUNNEL RELEASE Right   . CATARACT EXTRACTION, BILATERAL    . COLONOSCOPY  12/20/2012   Procedure: COLONOSCOPY;  Surgeon: Lafayette Dragon, MD;  Location: [facility];  Service: Endoscopy;  Laterality: N/A;  . ESOPHAGOGASTRODUODENOSCOPY  12/20/2012   Procedure: ESOPHAGOGASTRODUODENOSCOPY (EGD);  Surgeon: Lafayette Dragon, MD;  Location: [facility];  Service: Endoscopy;  Laterality: N/A;  . LESION REMOVAL  11/27/2011   Procedure: LESION REMOVAL;  Surgeon: Adin Hector, MD;  Location: [facility];  Service: General;  Laterality: Right;  Skin tag removed from under right eye. No specimen  . LUMBAR LAMINECTOMY     back surg. x 2 - 1980's and 1999  . MASTECTOMY PARTIAL / LUMPECTOMY  04/01/12   right breast, inv ductal ca, ER/PR  +, HER 2 -   . NM MYOVIEW LTD  08/02/2012   no ischemia   . PARTIAL HYSTERECTOMY     BSO  . PORTACATH PLACEMENT  11/27/2011   Procedure: INSERTION PORT-A-CATH;  Surgeon: Adin Hector, MD;  Location: [facility];  Service: General;  Laterality: Right;  . TOTAL HIP ARTHROPLASTY Right 03/17/2018   Procedure: RIGHT TOTAL HIP ARTHROPLASTY ANTERIOR APPROACH;  Surgeon: Rod Can, MD;  Location: [facility];  Service: Orthopedics;  Laterality: Right;  Needs RNFA  . TOTAL KNEE ARTHROPLASTY  07/28/2006   right  . US ECHOCARDIOGRAPHY  08/14/2008   technically difficult-mild LVH, EF =>55%,mild annular ca+,AOV mildly sclerotic    Family History  Problem Relation Age of Onset  . Cancer Maternal Grandfather        unaware  . Anesthesia problems Cousin        pt. states she  is not aware of any family anes. prob., does not know where this comment came from  . Cancer Cousin        breast, brain mets  . Cancer Cousin        ovarian, age 52-50  . Cancer Cousin        pancreatic  . Colon cancer Neg Hx   . Esophageal cancer Neg Hx   . Rectal cancer Neg Hx   . Stomach cancer Neg Hx     Social History   Socioeconomic History  . Marital status: Single    Spouse name: Not on file  . Number of children: Not on file  . Years of education: Not on file  . Highest education level: Not on file  Occupational History  . Occupation: retired    Fish farm manager: RETIRED  Social Needs  . Financial resource strain: Not on file  . Food insecurity:    Worry: Not on file    Inability: Not on file  . Transportation needs:    Medical: Not on file    Non-medical: Not on file  Tobacco Use  . Smoking status: Never Smoker  . Smokeless tobacco: Never Used  Substance and Sexual Activity  . Alcohol use: No    Alcohol/week: 0.0 standard drinks  . Drug use: No  . Sexual activity: Not Currently    Birth control/protection: Surgical  Lifestyle  . Physical activity:    Days per week: Not on file    Minutes per session: Not on file  . Stress: Not on file   Relationships  . Social connections:    Talks on phone: Not on file    Gets together: Not on file    Attends religious service: Not on file    Active member of club or organization: Not on file    Attends meetings of clubs or organizations: Not on file    Relationship status: Not on file  . Intimate partner violence:    Fear of current or ex partner: Not on file    Emotionally abused: Not on file    Physically abused: Not on file    Forced sexual activity: Not on file  Other Topics Concern  . 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  HENT: Negative.   Eyes: Negative for blurred vision.  Respiratory: Negative for cough, shortness of breath and wheezing.   Cardiovascular: Negative for chest pain and palpitations.  Gastrointestinal: as per HPI Genitourinary: Negative for dysuria, urgency, frequency and hematuria.  Musculoskeletal: Positive for myalgias, back pain and joint pain.  Skin: Negative for itching and rash.  Neurological: Negative for dizziness, tremors, focal weakness, seizures and loss of consciousness.  Endo/Heme/Allergies: Positive for seasonal allergies.  Psychiatric/Behavioral: Negative for depression, suicidal ideas and hallucinations.  All other systems reviewed and are negative.   Physical Exam: Vitals:   10/26/18 1345  BP: 118/60  Pulse: 100   Body mass index is 36.64 kg/m. Gen:      No acute distress HEENT:  EOMI, sclera anicteric Neck:     No masses; no thyromegaly Lungs:    Clear to auscultation bilaterally; normal respiratory effort CV:         Regular rate and rhythm; no murmurs Abd:      + bowel sounds;  soft, non-tender; no palpable masses, no distension Ext:    No edema; adequate peripheral perfusion Skin:      Warm and dry; no rash Neuro: alert and oriented x 3 Psych: normal  mood and affect  Data Reviewed:  Reviewed labs, radiology imaging, old records and pertinent past GI work up   Assessment and Plan/Recommendations:  77 year old female with history of GERD, dyspepsia, excessive belching and chronic constipation Intermittent upper back pain likely secondary musculoskeletal due to muscle spasm She currently does not have any discomfort, advised patient to follow-up with PMD if she has a recurrent episode Trial of FD guard 1 capsule up to 3 times daily for dyspepsia symptoms and upper abdominal discomfort Increase dietary fiber and fluid intake to improve constipation Return as needed  15 minutes was spent face-to-face with the patient. Greater than 50% of the time used for counseling as well as treatment plan and follow-up. She had multiple questions which were answered to her satisfaction  K. Denzil Magnuson , MD [phone]    CC: [name], MD

## 2018-10-26 NOTE — Patient Instructions (Addendum)
Follow up as needed  Use FD Gard up to 3 times daily as needed for dyspepsia, belching and upper abdominal discomfort   If you are age 75 or older, your body mass index should be between 23-30. Your Body mass index is 36.64 kg/m. If this is out of the aforementioned range listed, please consider follow up with your Primary Care Provider.  If you are age 80 or younger, your body mass index should be between 19-25. Your Body mass index is 36.64 kg/m. If this is out of the aformentioned range listed, please consider follow up with your Primary Care Provider.    Thank you for choosing [facility]  Karleen Hampshire Nandigam,MD

## 2018-11-08 ENCOUNTER — Encounter: Payer: Self-pay | Admitting: Gastroenterology

## 2019-01-10 DIAGNOSIS — Z23 Encounter for immunization: Secondary | ICD-10-CM | POA: Diagnosis not present

## 2019-01-31 DIAGNOSIS — M545 Low back pain: Secondary | ICD-10-CM | POA: Diagnosis not present

## 2019-02-10 ENCOUNTER — Ambulatory Visit: Payer: PPO | Admitting: Cardiovascular Disease

## 2019-02-13 ENCOUNTER — Ambulatory Visit: Payer: PPO | Admitting: Physician Assistant

## 2019-02-13 ENCOUNTER — Encounter: Payer: Self-pay | Admitting: Physician Assistant

## 2019-02-13 VITALS — BP 142/78 | HR 84 | Ht 64.5 in | Wt 222.0 lb

## 2019-02-13 DIAGNOSIS — Z853 Personal history of malignant neoplasm of breast: Secondary | ICD-10-CM | POA: Diagnosis not present

## 2019-02-13 DIAGNOSIS — E785 Hyperlipidemia, unspecified: Secondary | ICD-10-CM | POA: Diagnosis not present

## 2019-02-13 DIAGNOSIS — E039 Hypothyroidism, unspecified: Secondary | ICD-10-CM

## 2019-02-13 DIAGNOSIS — I10 Essential (primary) hypertension: Secondary | ICD-10-CM

## 2019-02-13 DIAGNOSIS — I25118 Atherosclerotic heart disease of native coronary artery with other forms of angina pectoris: Secondary | ICD-10-CM | POA: Diagnosis not present

## 2019-02-13 NOTE — Patient Instructions (Signed)
Medication Instructions:  Your physician recommends that you continue on your current medications as directed. Please refer to the Current Medication list given to you today.  If you need a refill on your cardiac medications before your next appointment, please call your pharmacy.   Lab work:  NONE  If you have labs (blood work) drawn today and your tests are completely normal, you will receive your results only by: Marland Kitchen MyChart Message (if you have MyChart) OR . A paper copy in the mail If you have any lab test that is abnormal or we need to change your treatment, we will call you to review the results.  Testing/Procedures:  NONE  Follow-Up: At [facility], you and your health needs are our priority.  As part of our continuing mission to provide you with exceptional heart care, we have created designated Provider Care Teams.  These Care Teams include your primary Cardiologist (physician) and Advanced Practice Providers (APPs -  Physician Assistants and Nurse Practitioners) who all work together to provide you with the care you need, when you need it. You will need a follow up appointment in 12 months (February 2021).  Please call our office in December 2020  to schedule this appointment.  You may see Sanda Klein, MD or one of the following Advanced Practice Providers on your designated Care Team: [place], [place] . Fabian Sharp, PA-C  Any Other Special Instructions Will Be Listed Below (If Applicable).

## 2019-02-13 NOTE — Progress Notes (Signed)
Cardiology Office Note    Date:  02/14/2019   ID:  Jasmine, Gutierrez May 12, 1941, MRN [id number]  PCP:  [name], MD  Cardiologist: Dr. Sallyanne Kuster  Chief Complaint  Patient presents with  . Follow-up    seen for Dr. Sallyanne Kuster    History of Present Illness:  [name] is a 78 y.o. female with past medical history of CAD, hypertension, hyperlipidemia, severe obesity, reactive airway disease, hypothyroidism and history of breast cancer s/p chemo and radiation therapy.  Previous cardiac catheterization in 2011 showed moderate disease in mid LAD with negative FFR. Myoview in February 2019 for preoperative evaluation was normal.  Patient presents today for cardiology office visit.  She denies any significant shortness of breath.  Although she has occasional chest tightness with more extreme exertion, this does not occur with every day activity.  This chest tightness with extreme exertion has been going on for more than a year now.  There has been no increase in frequency or duration.  The occurrence is quite rare at this time since the patient is not very active.  Unless there is change in the frequency of the symptom, we will continue to observe.  Otherwise she has no lower extremity edema, orthopnea or PND.    Past Medical History:  Diagnosis Date  . Anemia    history of  . Arthritis    osteo - s/p right total knee  . Asthma    daily and prn inhalers  . Boil, axilla   . Breast cancer ([place]) 10/28/11   right breast  . CAD (coronary artery disease)   . Colon polyp   . GERD (gastroesophageal reflux disease)   . Grave's disease    no current meds.  . H pylori ulcer   . Heart murmur   . History of blood transfusion 2006  . History of chemotherapy    neoadjuvant: 5 cycles of Taxol/carboplatinum:  last dose 02/24/12  . History of hiatal hernia   . Hyperlipidemia   . Hypertension    under control with med.  . Hypothyroid   . Liver cyst    History of   . Pre-diabetes   . RA  (rheumatoid arthritis) ([place])   . S/P chemotherapy, time since 4-12 weeks finished 02/20/2011  . S/P radiation therapy    Right Breast High Axilla and Supraclavicular gegion: 4500 cGy/25 Fractions with boost for a total of 6300 cGy  . Sleep apnea sleep study 09/18/2005   uses CPAP occ. - to bring machine DOS  . Spondylosis    cervical and lumbar  . Use of letrozole (Femara) start 07/21/12    Past Surgical History:  Procedure Laterality Date  . ABDOMINAL HYSTERECTOMY    . BILATERAL OOPHORECTOMY  2009  . Boil removal under arm Right   . BREAST BIOPSY Bilateral 1998  . BREAST LUMPECTOMY Right 03/2012  . CARDIAC CATHETERIZATION  04/10/2010, 05/07/2004   LAD lesion, blood flow OK, per pt.  Marland Kitchen CARDIOVASCULAR STRESS TEST  02/15/2018  . CARPAL TUNNEL RELEASE     left  . CARPAL TUNNEL RELEASE Right   . CATARACT EXTRACTION, BILATERAL    . COLONOSCOPY  12/20/2012   Procedure: COLONOSCOPY;  Surgeon: Lafayette Dragon, MD;  Location: [facility];  Service: Endoscopy;  Laterality: N/A;  . ESOPHAGOGASTRODUODENOSCOPY  12/20/2012   Procedure: ESOPHAGOGASTRODUODENOSCOPY (EGD);  Surgeon: Lafayette Dragon, MD;  Location: [facility];  Service: Endoscopy;  Laterality: N/A;  . LESION REMOVAL  11/27/2011  Procedure: LESION REMOVAL;  Surgeon: Adin Hector, MD;  Location: [facility];  Service: General;  Laterality: Right;  Skin tag removed from under right eye. No specimen  . LUMBAR LAMINECTOMY     back surg. x 2 - 1980's and 1999  . MASTECTOMY PARTIAL / LUMPECTOMY  04/01/12   right breast, inv ductal ca, ER/PR  +, HER 2 -   . NM MYOVIEW LTD  08/02/2012   no ischemia  . PARTIAL HYSTERECTOMY     BSO  . PORTACATH PLACEMENT  11/27/2011   Procedure: INSERTION PORT-A-CATH;  Surgeon: Adin Hector, MD;  Location: [facility];  Service: General;  Laterality: Right;  . TOTAL HIP ARTHROPLASTY Right 03/17/2018   Procedure: RIGHT TOTAL HIP ARTHROPLASTY ANTERIOR APPROACH;  Surgeon: Rod Can, MD;  Location: [facility];  Service: Orthopedics;  Laterality: Right;  Needs RNFA  . TOTAL KNEE ARTHROPLASTY  07/28/2006   right  . US ECHOCARDIOGRAPHY  08/14/2008   technically difficult-mild LVH, EF =>55%,mild annular ca+,AOV mildly sclerotic    Current Medications: Outpatient Medications Prior to Visit  Medication Sig Dispense Refill  . aspirin 81 MG chewable tablet Chew 1 tablet (81 mg total) by mouth 2 (two) times daily. 60 tablet 1  . Cholecalciferol (VITAMIN D-3) 5000 units TABS Take 5,000 Units by mouth daily.    . diclofenac sodium (VOLTAREN) 1 % GEL Apply 2-4 g topically 4 (four) times daily as needed. For joint pain.  3  . diltiazem (CARDIZEM CD) 240 MG 24 hr capsule Take 240 mg by mouth daily.  3  . docusate sodium (COLACE) 100 MG capsule Take 1 capsule (100 mg total) by mouth 2 (two) times daily. 60 capsule 1  . ezetimibe (ZETIA) 10 MG tablet Take 10 mg by mouth every evening.     . Fluticasone-Salmeterol (ADVAIR) 100-50 MCG/DOSE AEPB Inhale 1 puff into the lungs daily.     . folic acid (FOLVITE) 1 MG tablet Take 1 mg by mouth daily.    Marland Kitchen letrozole (FEMARA) 2.5 MG tablet Take 1 tablet (2.5 mg total) by mouth daily. 90 tablet 3  . levothyroxine (SYNTHROID, LEVOTHROID) 75 MCG tablet Take 75 mcg by mouth daily before breakfast.   2  . Methotrexate (XATMEP PO) methotrexate    . ondansetron (ZOFRAN) 4 MG tablet Take 1 tablet (4 mg total) by mouth every 6 (six) hours as needed for nausea. 20 tablet 0  . ranitidine (ZANTAC) 150 MG tablet Take 150 mg by mouth See admin instructions. Take 1 tablet (150 mg) by mouth scheduled in the morning, may take an additional tablet in the evening if needed.    . rosuvastatin (CRESTOR) 10 MG tablet Take 1 tablet (10 mg total) by mouth 3 (three) times a week. Mondays, Wednesdays & Fridays. 40 tablet 3  . Tiotropium Bromide Monohydrate (SPIRIVA RESPIMAT IN) Spiriva with HandiHaler    . Tiotropium Bromide Monohydrate (SPIRIVA RESPIMAT) 1.25 MCG/ACT AERS  Inhale 2.5 mcg into the lungs daily as needed (for respiratory issues.).    Marland Kitchen traMADol (ULTRAM) 50 MG tablet Take by mouth every 6 (six) hours as needed.    . triamterene-hydrochlorothiazide (MAXZIDE-25) 37.5-25 MG per tablet Take 1 tablet by mouth daily.     Marland Kitchen HYDROcodone-acetaminophen (NORCO/VICODIN) 5-325 MG tablet Take 1-2 tablets by mouth every 4 (four) hours as needed. 50 tablet 0   No facility-administered medications prior to visit.      Allergies:   Crestor [rosuvastatin]; Lactose intolerance (gi); Lipitor [  atorvastatin calcium]; and Zocor [simvastatin - high dose]   Social History   Socioeconomic History  . Marital status: Single    Spouse name: Not on file  . Number of children: Not on file  . Years of education: Not on file  . Highest education level: Not on file  Occupational History  . Occupation: retired    Fish farm manager: RETIRED  Social Needs  . Financial resource strain: Not on file  . Food insecurity:    Worry: Not on file    Inability: Not on file  . Transportation needs:    Medical: Not on file    Non-medical: Not on file  Tobacco Use  . Smoking status: Never Smoker  . Smokeless tobacco: Never Used  Substance and Sexual Activity  . Alcohol use: No    Alcohol/week: 0.0 standard drinks  . Drug use: No  . Sexual activity: Not Currently    Birth control/protection: Surgical  Lifestyle  . Physical activity:    Days per week: Not on file    Minutes per session: Not on file  . Stress: Not on file  Relationships  . Social connections:    Talks on phone: Not on file    Gets together: Not on file    Attends religious service: Not on file    Active member of club or organization: Not on file    Attends meetings of clubs or organizations: Not on file    Relationship status: Not on file  Other Topics Concern  . Not on file  Social History Narrative   Retired ED nurse from [employer], single, 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      G0     Family History:  The patient's  family history includes Anesthesia problems in her cousin; Cancer in her cousin, cousin, cousin, and maternal grandfather.   ROS:   Please see the history of present illness.    ROS All other systems reviewed and are negative.   PHYSICAL EXAM:   VS:  BP (!) 142/78   Pulse 84   Ht 5' 4.5" (1.638 m)   Wt 222 lb (100.7 kg)   BMI 37.52 kg/m    GEN: Well nourished, well developed, in no acute distress  HEENT: normal  Neck: no JVD, carotid bruits, or masses Cardiac: RRR; no murmurs, rubs, or gallops,no edema  Respiratory:  clear to auscultation bilaterally, normal work of breathing GI: soft, nontender, nondistended, + BS MS: no deformity or atrophy  Skin: warm and dry, no rash Neuro:  Alert and Oriented x 3, Strength and sensation are intact Psych: euthymic mood, full affect  Wt Readings from Last 3 Encounters:  02/13/19 222 lb (100.7 kg)  10/26/18 220 lb 3.2 oz (99.9 kg)  09/26/18 222 lb (100.7 kg)      Studies/Labs Reviewed:   EKG:  EKG is ordered today.  The ekg ordered today demonstrates normal sinus rhythm, nonspecific T wave changes  Recent Labs: 03/18/2018: BUN 20; Creatinine, Ser 0.84; Hemoglobin 11.4; Platelets 242; Potassium 3.6; Sodium 140   Lipid Panel    Component Value Date/Time   CHOL 223 (H) 02/22/2015 0935   TRIG 92 02/22/2015 0935   HDL 64 02/22/2015 0935   CHOLHDL 3.5 02/22/2015 0935   VLDL 18 02/22/2015 0935   LDLCALC 141 (H) 02/22/2015 0935    Additional studies/ records that were reviewed today include:   Cath 04/10/2010     ASSESSMENT:    1. Coronary artery disease of native artery of native heart  with stable angina pectoris ([place])   2. Essential hypertension   3. Hyperlipidemia LDL goal <70   4. Hypothyroidism, unspecified type   5. History of right breast cancer      PLAN:  In order of problems listed above:  1. CAD: Patient has been having stable angina for the past year despite negative Myoview.  Symptoms only occur with more  extreme activity but not with every day activity.  Occurrence of chest tightness during exertion is quite rare.  We will continue the current therapy and the patient has been instructed to let us know if her symptoms become more frequent, lasting longer or begin to occur at rest.  2. Hypertension: Blood pressure elevated today, however normally her blood pressure is very well controlled  3. Hyperlipidemia: On Crestor 10 mg daily.  4. Hypothyroidism: Managed by primary care provider  5. Right breast cancer: Underwent chemo and radiation therapy, followed by oncology.    Medication Adjustments/Labs and Tests Ordered: Current medicines are reviewed at length with the patient today.  Concerns regarding medicines are outlined above.  Medication changes, Labs and Tests ordered today are listed in the Patient Instructions below. Patient Instructions  Medication Instructions:  Your physician recommends that you continue on your current medications as directed. Please refer to the Current Medication list given to you today.  If you need a refill on your cardiac medications before your next appointment, please call your pharmacy.   Lab work:  NONE  If you have labs (blood work) drawn today and your tests are completely normal, you will receive your results only by: Marland Kitchen MyChart Message (if you have MyChart) OR . A paper copy in the mail If you have any lab test that is abnormal or we need to change your treatment, we will call you to review the results.  Testing/Procedures:  NONE  Follow-Up: At [facility], you and your health needs are our priority.  As part of our continuing mission to provide you with exceptional heart care, we have created designated Provider Care Teams.  These Care Teams include your primary Cardiologist (physician) and Advanced Practice Providers (APPs -  Physician Assistants and Nurse Practitioners) who all work together to provide you with the care you need, when you  need it. You will need a follow up appointment in 12 months (February 2021).  Please call our office in December 2020  to schedule this appointment.  You may see Sanda Klein, MD or one of the following Advanced Practice Providers on your designated Care Team: [place], [place] . Fabian Sharp, PA-C  Any Other Special Instructions Will Be Listed Below (If Applicable).       Hilbert Corrigan, [place]  02/14/2019 9:26 AM    [facility] [place], [place], [place]  [address] Phone: [phone]; Fax: [phone]

## 2019-02-14 ENCOUNTER — Encounter: Payer: Self-pay | Admitting: Physician Assistant

## 2019-02-23 DIAGNOSIS — Z79899 Other long term (current) drug therapy: Secondary | ICD-10-CM | POA: Diagnosis not present

## 2019-02-23 DIAGNOSIS — M0609 Rheumatoid arthritis without rheumatoid factor, multiple sites: Secondary | ICD-10-CM | POA: Diagnosis not present

## 2019-03-01 DIAGNOSIS — C50919 Malignant neoplasm of unspecified site of unspecified female breast: Secondary | ICD-10-CM | POA: Diagnosis not present

## 2019-03-01 DIAGNOSIS — E1169 Type 2 diabetes mellitus with other specified complication: Secondary | ICD-10-CM | POA: Diagnosis not present

## 2019-03-01 DIAGNOSIS — J45998 Other asthma: Secondary | ICD-10-CM | POA: Diagnosis not present

## 2019-03-01 DIAGNOSIS — Z6836 Body mass index (BMI) 36.0-36.9, adult: Secondary | ICD-10-CM | POA: Diagnosis not present

## 2019-03-01 DIAGNOSIS — M069 Rheumatoid arthritis, unspecified: Secondary | ICD-10-CM | POA: Diagnosis not present

## 2019-03-01 DIAGNOSIS — E038 Other specified hypothyroidism: Secondary | ICD-10-CM | POA: Diagnosis not present

## 2019-03-01 DIAGNOSIS — I10 Essential (primary) hypertension: Secondary | ICD-10-CM | POA: Diagnosis not present

## 2019-03-01 DIAGNOSIS — I2510 Atherosclerotic heart disease of native coronary artery without angina pectoris: Secondary | ICD-10-CM | POA: Diagnosis not present

## 2019-03-22 DIAGNOSIS — E1140 Type 2 diabetes mellitus with diabetic neuropathy, unspecified: Secondary | ICD-10-CM | POA: Diagnosis not present

## 2019-03-22 DIAGNOSIS — Z79899 Other long term (current) drug therapy: Secondary | ICD-10-CM | POA: Diagnosis not present

## 2019-03-22 DIAGNOSIS — M0609 Rheumatoid arthritis without rheumatoid factor, multiple sites: Secondary | ICD-10-CM | POA: Diagnosis not present

## 2019-03-22 DIAGNOSIS — M549 Dorsalgia, unspecified: Secondary | ICD-10-CM | POA: Diagnosis not present

## 2019-03-22 DIAGNOSIS — I10 Essential (primary) hypertension: Secondary | ICD-10-CM | POA: Diagnosis not present

## 2019-03-22 DIAGNOSIS — M25559 Pain in unspecified hip: Secondary | ICD-10-CM | POA: Diagnosis not present

## 2019-03-22 DIAGNOSIS — J449 Chronic obstructive pulmonary disease, unspecified: Secondary | ICD-10-CM | POA: Diagnosis not present

## 2019-03-22 DIAGNOSIS — E785 Hyperlipidemia, unspecified: Secondary | ICD-10-CM | POA: Diagnosis not present

## 2019-07-18 DIAGNOSIS — Z79899 Other long term (current) drug therapy: Secondary | ICD-10-CM | POA: Diagnosis not present

## 2019-07-18 DIAGNOSIS — M0609 Rheumatoid arthritis without rheumatoid factor, multiple sites: Secondary | ICD-10-CM | POA: Diagnosis not present

## 2019-07-20 DIAGNOSIS — E038 Other specified hypothyroidism: Secondary | ICD-10-CM | POA: Diagnosis not present

## 2019-07-20 DIAGNOSIS — E559 Vitamin D deficiency, unspecified: Secondary | ICD-10-CM | POA: Diagnosis not present

## 2019-07-20 DIAGNOSIS — E7849 Other hyperlipidemia: Secondary | ICD-10-CM | POA: Diagnosis not present

## 2019-07-20 DIAGNOSIS — E1169 Type 2 diabetes mellitus with other specified complication: Secondary | ICD-10-CM | POA: Diagnosis not present

## 2019-07-25 DIAGNOSIS — M25559 Pain in unspecified hip: Secondary | ICD-10-CM | POA: Diagnosis not present

## 2019-07-25 DIAGNOSIS — E785 Hyperlipidemia, unspecified: Secondary | ICD-10-CM | POA: Diagnosis not present

## 2019-07-25 DIAGNOSIS — I10 Essential (primary) hypertension: Secondary | ICD-10-CM | POA: Diagnosis not present

## 2019-07-25 DIAGNOSIS — Z79899 Other long term (current) drug therapy: Secondary | ICD-10-CM | POA: Diagnosis not present

## 2019-07-25 DIAGNOSIS — M549 Dorsalgia, unspecified: Secondary | ICD-10-CM | POA: Diagnosis not present

## 2019-07-25 DIAGNOSIS — M0609 Rheumatoid arthritis without rheumatoid factor, multiple sites: Secondary | ICD-10-CM | POA: Diagnosis not present

## 2019-07-25 DIAGNOSIS — R82998 Other abnormal findings in urine: Secondary | ICD-10-CM | POA: Diagnosis not present

## 2019-07-25 DIAGNOSIS — M25569 Pain in unspecified knee: Secondary | ICD-10-CM | POA: Diagnosis not present

## 2019-07-25 DIAGNOSIS — J449 Chronic obstructive pulmonary disease, unspecified: Secondary | ICD-10-CM | POA: Diagnosis not present

## 2019-07-27 ENCOUNTER — Other Ambulatory Visit: Payer: Self-pay

## 2019-07-27 DIAGNOSIS — E1169 Type 2 diabetes mellitus with other specified complication: Secondary | ICD-10-CM | POA: Diagnosis not present

## 2019-07-27 DIAGNOSIS — M069 Rheumatoid arthritis, unspecified: Secondary | ICD-10-CM | POA: Diagnosis not present

## 2019-07-27 DIAGNOSIS — E559 Vitamin D deficiency, unspecified: Secondary | ICD-10-CM | POA: Diagnosis not present

## 2019-07-27 DIAGNOSIS — D649 Anemia, unspecified: Secondary | ICD-10-CM | POA: Diagnosis not present

## 2019-07-27 DIAGNOSIS — E669 Obesity, unspecified: Secondary | ICD-10-CM | POA: Diagnosis not present

## 2019-07-27 DIAGNOSIS — J45909 Unspecified asthma, uncomplicated: Secondary | ICD-10-CM | POA: Diagnosis not present

## 2019-07-27 DIAGNOSIS — R700 Elevated erythrocyte sedimentation rate: Secondary | ICD-10-CM | POA: Diagnosis not present

## 2019-07-27 DIAGNOSIS — G609 Hereditary and idiopathic neuropathy, unspecified: Secondary | ICD-10-CM | POA: Diagnosis not present

## 2019-07-27 DIAGNOSIS — Z1331 Encounter for screening for depression: Secondary | ICD-10-CM | POA: Diagnosis not present

## 2019-07-27 DIAGNOSIS — E039 Hypothyroidism, unspecified: Secondary | ICD-10-CM | POA: Diagnosis not present

## 2019-07-27 DIAGNOSIS — C50919 Malignant neoplasm of unspecified site of unspecified female breast: Secondary | ICD-10-CM | POA: Diagnosis not present

## 2019-07-27 DIAGNOSIS — Z0000 Encounter for general adult medical examination without abnormal findings: Secondary | ICD-10-CM | POA: Diagnosis not present

## 2019-07-27 DIAGNOSIS — E785 Hyperlipidemia, unspecified: Secondary | ICD-10-CM | POA: Diagnosis not present

## 2019-07-27 DIAGNOSIS — I2510 Atherosclerotic heart disease of native coronary artery without angina pectoris: Secondary | ICD-10-CM | POA: Diagnosis not present

## 2019-07-27 DIAGNOSIS — Z20822 Contact with and (suspected) exposure to covid-19: Secondary | ICD-10-CM

## 2019-07-28 NOTE — Assessment & Plan Note (Signed)
Right breast invasive ductal carcinoma with DCIS 2.5 cm with the MRI showing a maximum diameter of 7.5 cm ER/PR positive HER-2 negative Ki-67 15% treated with neoadjuvant chemotherapy with Taxotere and Cytoxan 12/02/2011 4 followed by surgery with lumpectomy which showed IDC with lymphovascular invasion 1.9 cm, 2 sentinel lymph nodes negative, followed by radiation therapy and now on antiestrogen therapy with letrozole 2.5 mg daily from 07/21/2012  Letrozole toxicities: Occasional hot flashes Letrozole completed August 2020  03/17/2018: Left hip arthroplasty  Breast cancer surveillance: 1. Breast exam 8/14/2019right breast prior surgical changes and firmness underneath the scar tissue which is normal postsurgical change. 2. Mammogram8/19/2019normal, breast density category B.  She will need a mammogram this year.    Return to clinic in 1 year for follow-up

## 2019-07-29 LAB — NOVEL CORONAVIRUS, NAA: SARS-CoV-2, NAA: NOT DETECTED

## 2019-07-31 ENCOUNTER — Telehealth: Payer: Self-pay | Admitting: Hematology and Oncology

## 2019-07-31 NOTE — Telephone Encounter (Signed)
I left a message regarding visit and told to call if we could change to video visit

## 2019-08-03 DIAGNOSIS — H5213 Myopia, bilateral: Secondary | ICD-10-CM | POA: Diagnosis not present

## 2019-08-03 DIAGNOSIS — E0590 Thyrotoxicosis, unspecified without thyrotoxic crisis or storm: Secondary | ICD-10-CM | POA: Diagnosis not present

## 2019-08-03 DIAGNOSIS — Z961 Presence of intraocular lens: Secondary | ICD-10-CM | POA: Diagnosis not present

## 2019-08-03 DIAGNOSIS — E119 Type 2 diabetes mellitus without complications: Secondary | ICD-10-CM | POA: Diagnosis not present

## 2019-08-03 NOTE — Progress Notes (Signed)
  HEMATOLOGY-ONCOLOGY TELEPHONE VISIT PROGRESS NOTE  I connected with Carvel Getting on 08/04/2019 at  9:00 AM EDT by telephone and verified that I am speaking with the correct person using two identifiers.  I discussed the limitations, risks, security and privacy concerns of performing an evaluation and management service by telephone and the availability of in person appointments.  I also discussed with the patient that there may be a patient responsible charge related to this service. The patient expressed understanding and agreed to proceed.   History of Present Illness: [name] is a 78 y.o. female with above-mentioned history of right breast cancer treated with neoadjuvant chemotherapy, lumpectomy, radiation, and who is currently on anti-estrogen therapy with letrozole. I last saw her a year ago. She presents over the phone today for annual follow-up. Lost her grand son (MI) C/O neuropathy  Oncology History  Malignant neoplasm of upper-outer quadrant of female breast ([place])  12/02/2011 -  Neo-Adjuvant Chemotherapy   Taxotere and Cytoxan 4    04/20/2012 Initial Diagnosis   Right breast invasive ductal carcinoma with DCIS 2.5 cm with the MRI showing a maximum diameter of 7.5 cm ER/PR positive HER-2 negative Ki-67 15%    Surgery   Lumpectomy: IDC with lymphovascular invasion 1.9 cm, 2 sentinel lymph nodes negative    Radiation Therapy   Adjuvant radiation   07/21/2012 -  Anti-estrogen oral therapy   Letrozole 2.5 mg daily     Observations/Objective:  No clinical evidence of recurrence   Assessment Plan:  Breast cancer of upper-outer quadrant of right female breast ([place]) Right breast invasive ductal carcinoma with DCIS 2.5 cm with the MRI showing a maximum diameter of 7.5 cm ER/PR positive HER-2 negative Ki-67 15% treated with neoadjuvant chemotherapy with Taxotere and Cytoxan 12/02/2011 4 followed by surgery with lumpectomy which showed IDC with lymphovascular invasion 1.9 cm, 2  sentinel lymph nodes negative, followed by radiation therapy and now on antiestrogen therapy with letrozole 2.5 mg daily from 07/21/2012  Letrozole toxicities: Occasional hot flashes Letrozole completed August 2020  Peripheral neuropathy: On B 12 supplements and gabapentin 100 mg 03/17/2018: Left hip arthroplasty  Breast cancer surveillance: 1. Breast exam 8/14/2019right breast prior surgical changes and firmness underneath the scar tissue which is normal postsurgical change. 2. Mammogram8/19/2019normal, breast density category B.  She will need a mammogram this year.   Return to clinic in 1 year for follow-up with long term survivorship.  I discussed the assessment and treatment plan with the patient. The patient was provided an opportunity to ask questions and all were answered. The patient agreed with the plan and demonstrated an understanding of the instructions. The patient was advised to call back or seek an in-person evaluation if the symptoms worsen or if the condition fails to improve as anticipated.   I provided 15 minutes of non-face-to-face time during this encounter.   Rulon Eisenmenger, MD 08/04/2019    I, Molly Dorshimer, am acting as scribe for Nicholas Lose, MD.  I have reviewed the above documentation for accuracy and completeness, and I agree with the above.

## 2019-08-04 ENCOUNTER — Inpatient Hospital Stay: Payer: PPO | Attending: Hematology and Oncology | Admitting: Hematology and Oncology

## 2019-08-04 DIAGNOSIS — C50411 Malignant neoplasm of upper-outer quadrant of right female breast: Secondary | ICD-10-CM | POA: Diagnosis not present

## 2019-08-04 DIAGNOSIS — Z170 Estrogen receptor positive status [ER+]: Secondary | ICD-10-CM

## 2019-08-04 MED ORDER — GABAPENTIN 100 MG PO CAPS: 300.0000 mg | ORAL_CAPSULE | Freq: Every day | ORAL | Status: AC

## 2019-08-07 ENCOUNTER — Other Ambulatory Visit: Payer: Self-pay | Admitting: Hematology and Oncology

## 2019-08-07 ENCOUNTER — Telehealth: Payer: Self-pay | Admitting: Adult Health

## 2019-08-07 DIAGNOSIS — Z1231 Encounter for screening mammogram for malignant neoplasm of breast: Secondary | ICD-10-CM

## 2019-08-07 NOTE — Telephone Encounter (Signed)
I left a message regarding schedule  

## 2019-09-15 ENCOUNTER — Ambulatory Visit: Payer: PPO

## 2019-10-16 ENCOUNTER — Ambulatory Visit
Admission: RE | Admit: 2019-10-16 | Discharge: 2019-10-16 | Disposition: A | Discharge status: Home / Self Care | Payer: PPO | Source: Ambulatory Visit | Attending: Hematology and Oncology | Admitting: Hematology and Oncology

## 2019-10-16 ENCOUNTER — Other Ambulatory Visit: Payer: Self-pay

## 2019-10-16 DIAGNOSIS — Z1231 Encounter for screening mammogram for malignant neoplasm of breast: Secondary | ICD-10-CM | POA: Diagnosis not present

## 2019-11-06 ENCOUNTER — Other Ambulatory Visit: Payer: Self-pay | Admitting: Cardiovascular Disease

## 2019-11-27 DIAGNOSIS — E1169 Type 2 diabetes mellitus with other specified complication: Secondary | ICD-10-CM | POA: Diagnosis not present

## 2019-11-27 DIAGNOSIS — E039 Hypothyroidism, unspecified: Secondary | ICD-10-CM | POA: Diagnosis not present

## 2019-11-27 DIAGNOSIS — K219 Gastro-esophageal reflux disease without esophagitis: Secondary | ICD-10-CM | POA: Diagnosis not present

## 2019-11-27 DIAGNOSIS — G609 Hereditary and idiopathic neuropathy, unspecified: Secondary | ICD-10-CM | POA: Diagnosis not present

## 2019-11-27 DIAGNOSIS — I2510 Atherosclerotic heart disease of native coronary artery without angina pectoris: Secondary | ICD-10-CM | POA: Diagnosis not present

## 2019-11-27 DIAGNOSIS — M542 Cervicalgia: Secondary | ICD-10-CM | POA: Diagnosis not present

## 2019-11-27 DIAGNOSIS — C50919 Malignant neoplasm of unspecified site of unspecified female breast: Secondary | ICD-10-CM | POA: Diagnosis not present

## 2019-11-27 DIAGNOSIS — I10 Essential (primary) hypertension: Secondary | ICD-10-CM | POA: Diagnosis not present

## 2019-11-27 DIAGNOSIS — J45909 Unspecified asthma, uncomplicated: Secondary | ICD-10-CM | POA: Diagnosis not present

## 2019-11-27 DIAGNOSIS — D649 Anemia, unspecified: Secondary | ICD-10-CM | POA: Diagnosis not present

## 2019-11-27 DIAGNOSIS — M069 Rheumatoid arthritis, unspecified: Secondary | ICD-10-CM | POA: Diagnosis not present

## 2019-12-01 DIAGNOSIS — Z79899 Other long term (current) drug therapy: Secondary | ICD-10-CM | POA: Diagnosis not present

## 2019-12-01 DIAGNOSIS — M0609 Rheumatoid arthritis without rheumatoid factor, multiple sites: Secondary | ICD-10-CM | POA: Diagnosis not present

## 2019-12-20 DIAGNOSIS — E1169 Type 2 diabetes mellitus with other specified complication: Secondary | ICD-10-CM | POA: Diagnosis not present

## 2019-12-20 DIAGNOSIS — R030 Elevated blood-pressure reading, without diagnosis of hypertension: Secondary | ICD-10-CM | POA: Diagnosis not present

## 2019-12-20 DIAGNOSIS — E038 Other specified hypothyroidism: Secondary | ICD-10-CM | POA: Diagnosis not present

## 2019-12-21 DIAGNOSIS — R030 Elevated blood-pressure reading, without diagnosis of hypertension: Secondary | ICD-10-CM | POA: Diagnosis not present

## 2020-02-06 ENCOUNTER — Encounter: Payer: Self-pay | Admitting: Cardiovascular Disease

## 2020-02-06 ENCOUNTER — Ambulatory Visit: Payer: PPO | Admitting: Cardiovascular Disease

## 2020-02-06 ENCOUNTER — Other Ambulatory Visit: Payer: Self-pay

## 2020-02-06 VITALS — BP 130/74 | HR 67 | Ht 64.5 in | Wt 218.8 lb

## 2020-02-06 DIAGNOSIS — I25118 Atherosclerotic heart disease of native coronary artery with other forms of angina pectoris: Secondary | ICD-10-CM | POA: Diagnosis not present

## 2020-02-06 DIAGNOSIS — I10 Essential (primary) hypertension: Secondary | ICD-10-CM | POA: Diagnosis not present

## 2020-02-06 DIAGNOSIS — E7800 Pure hypercholesterolemia, unspecified: Secondary | ICD-10-CM

## 2020-02-06 DIAGNOSIS — Z6836 Body mass index (BMI) 36.0-36.9, adult: Secondary | ICD-10-CM

## 2020-02-06 NOTE — Progress Notes (Signed)
Patient ID: [name], female   DOB: [birth year], 79 y.o.   MRN: [id number]    Cardiology Office Note    Date:  02/06/2020   ID:  [name], [name] 1941-09-02, MRN [id number]  PCP:  [name], MD  Cardiologist:    Sanda Klein, MD   Chief Complaint  Patient presents with  . Chest Pain    History of Present Illness:  [name] is a 79 y.o. female with a history of moderate coronary artery disease, hypertension, hyperlipidemia, severe obesity, reactive airway disease, right total hip replacement and treated hypothyroidism.  She presents with complaints of "indigestion" and an aching discomfort in her left arm.  She had a normal nuclear stress test in February 2019 (preop for her hip replacement).  She is worried about 2 different complaints.  She sometimes has low retrosternal "indigestion".  This occurs at rest not always after eating, sometimes independently of meals.  She also complains of an aching sensation in her left shoulder that radiates to her elbow that worsens when she is "moving around".  This seems to improve after resting, but sometimes it will last for a day or 2 without abating completely.  Sometimes the indigestion and the left arm pain occur simultaneously, but she is not entirely sure this is always the case.  She has some exertional dyspnea that has not changed from previous.  She denies palpitations, syncope,  claudication, focal neurological complaints, falls injuries or bleeding, abdominal pain, nausea vomiting or diarrhea.  She has chronic mild symmetrical ankle edema.  Her 51 year old grandson died of an acute myocardial infarction last July.  She is on  Zetia daily and only taking Crestor 10 mg half tablet 3 days a week.  She has not tolerated daily statins due to muscle cramps.  She takes self-administered weekly methotrexate injections for rheumatoid arthritis.  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  She had an intermediate severity stenosis of the mid LAD artery. This was demonstrated by coronary angiography in 2011, but shown not to be hemodynamically important by pressure wire analysis. She had some problems with intrascapular pressure in 2013 and in 2019 had nuclear stress tests that showed normal myocardial perfusion and normal left ventricular systolic function.  Past Medical History:  Diagnosis Date  . Anemia    history of  . Arthritis    osteo - s/p right total knee  . Asthma    daily and prn inhalers  . Boil, axilla   . Breast cancer ([place]) 10/28/11   right breast  . CAD (coronary artery disease)   . Colon polyp   . GERD (gastroesophageal reflux disease)   . Grave's disease    no current meds.  . H pylori ulcer   . Heart murmur   . History of blood transfusion 2006  . History of chemotherapy    neoadjuvant: 5 cycles of Taxol/carboplatinum:  last dose 02/24/12  . History of hiatal hernia   . Hyperlipidemia   . Hypertension    under control with med.  . Hypothyroid   . Liver cyst    History of   . Pre-diabetes   . RA (rheumatoid arthritis) ([place])   . S/P chemotherapy, time since 4-12 weeks finished 02/20/2011  . S/P radiation therapy    Right Breast High Axilla and Supraclavicular gegion: 4500 cGy/25 Fractions with boost for a total of 6300 cGy  . Sleep apnea sleep study 09/18/2005  uses CPAP occ. - to bring machine DOS  . Spondylosis    cervical and lumbar  . Use of letrozole (Femara) start 07/21/12    Past Surgical History:  Procedure Laterality Date  . ABDOMINAL HYSTERECTOMY    . BILATERAL OOPHORECTOMY  2009  . Boil removal under arm Right   . BREAST BIOPSY Bilateral 1998  . BREAST LUMPECTOMY Right 03/2012  . CARDIAC CATHETERIZATION  04/10/2010, 05/07/2004   LAD lesion, blood flow OK, per pt.  Marland Kitchen CARDIOVASCULAR STRESS TEST  02/15/2018  . CARPAL TUNNEL RELEASE     left  . CARPAL TUNNEL RELEASE Right   . CATARACT EXTRACTION, BILATERAL    . COLONOSCOPY   12/20/2012   Procedure: COLONOSCOPY;  Surgeon: Lafayette Dragon, MD;  Location: [facility];  Service: Endoscopy;  Laterality: N/A;  . ESOPHAGOGASTRODUODENOSCOPY  12/20/2012   Procedure: ESOPHAGOGASTRODUODENOSCOPY (EGD);  Surgeon: Lafayette Dragon, MD;  Location: [facility];  Service: Endoscopy;  Laterality: N/A;  . LESION REMOVAL  11/27/2011   Procedure: LESION REMOVAL;  Surgeon: Adin Hector, MD;  Location: [place];  Service: General;  Laterality: Right;  Skin tag removed from under right eye. No specimen  . LUMBAR LAMINECTOMY     back surg. x 2 - 1980's and 1999  . MASTECTOMY PARTIAL / LUMPECTOMY  04/01/12   right breast, inv ductal ca, ER/PR  +, HER 2 -   . NM MYOVIEW LTD  08/02/2012   no ischemia  . PARTIAL HYSTERECTOMY     BSO  . PORTACATH PLACEMENT  11/27/2011   Procedure: INSERTION PORT-A-CATH;  Surgeon: Adin Hector, MD;  Location: [place];  Service: General;  Laterality: Right;  . TOTAL HIP ARTHROPLASTY Right 03/17/2018   Procedure: RIGHT TOTAL HIP ARTHROPLASTY ANTERIOR APPROACH;  Surgeon: Rod Can, MD;  Location: [facility];  Service: Orthopedics;  Laterality: Right;  Needs RNFA  . TOTAL KNEE ARTHROPLASTY  07/28/2006   right  . US ECHOCARDIOGRAPHY  08/14/2008   technically difficult-mild LVH, EF =>55%,mild annular ca+,AOV mildly sclerotic    Current Medications: Outpatient Medications Prior to Visit  Medication Sig Dispense Refill  . aspirin 81 MG chewable tablet Chew 1 tablet (81 mg total) by mouth 2 (two) times daily. 60 tablet 1  . Cholecalciferol (VITAMIN D-3) 5000 units TABS Take 5,000 Units by mouth daily.    . diclofenac sodium (VOLTAREN) 1 % GEL Apply 2-4 g topically 4 (four) times daily as needed. For joint pain.  3  . diltiazem (CARDIZEM CD) 240 MG 24 hr capsule Take 240 mg by mouth daily.  3  . docusate sodium (COLACE) 100 MG capsule Take 1 capsule (100 mg total) by mouth 2 (two) times daily. 60 capsule 1  . ezetimibe (ZETIA)  10 MG tablet Take 10 mg by mouth every evening.     . Fluticasone-Salmeterol (ADVAIR) 100-50 MCG/DOSE AEPB Inhale 1 puff into the lungs daily.     . folic acid (FOLVITE) 1 MG tablet Take 1 mg by mouth daily.    Marland Kitchen gabapentin (NEURONTIN) 100 MG capsule Take 3 capsules (300 mg total) by mouth at bedtime.    Marland Kitchen levothyroxine (SYNTHROID, LEVOTHROID) 75 MCG tablet Take 75 mcg by mouth daily before breakfast.   2  . Methotrexate (XATMEP PO) methotrexate    . omeprazole (PRILOSEC) 20 MG capsule Take 20 mg by mouth daily.    . ondansetron (ZOFRAN) 4 MG tablet Take 1 tablet (4 mg total) by mouth every 6 (six)  hours as needed for nausea. 20 tablet 0  . rosuvastatin (CRESTOR) 10 MG tablet TAKE 1 TABLET (10 MG TOTAL) BY MOUTH 3 (THREE) TIMES A WEEK. MONDAYS, WEDNESDAYS & FRIDAYS. 40 tablet 1  . TIADYLT ER 360 MG 24 hr capsule Take 360 mg by mouth at bedtime.    . Tiotropium Bromide Monohydrate (SPIRIVA RESPIMAT IN) Spiriva with HandiHaler    . Tiotropium Bromide Monohydrate (SPIRIVA RESPIMAT) 1.25 MCG/ACT AERS Inhale 2.5 mcg into the lungs daily as needed (for respiratory issues.).    Marland Kitchen traMADol (ULTRAM) 50 MG tablet Take by mouth every 6 (six) hours as needed.    . triamterene-hydrochlorothiazide (MAXZIDE-25) 37.5-25 MG per tablet Take 1 tablet by mouth daily.     . Cholecalciferol (VITAMIN D3) 125 MCG (5000 UT) CAPS Take 1 tablet by mouth daily.    . ranitidine (ZANTAC) 150 MG tablet Take 150 mg by mouth See admin instructions. Take 1 tablet (150 mg) by mouth scheduled in the morning, may take an additional tablet in the evening if needed.     No facility-administered medications prior to visit.     Allergies:   Crestor [rosuvastatin], Lactose intolerance (gi), Lipitor [atorvastatin calcium], and Zocor [simvastatin - high dose]   Social History   Socioeconomic History  . Marital status: Single    Spouse name: Not on file  . Number of children: Not on file  . Years of education: Not on file  .  Highest education level: Not on file  Occupational History  . Occupation: retired    Fish farm manager: RETIRED  Tobacco Use  . Smoking status: Never Smoker  . Smokeless tobacco: Never Used  Substance and Sexual Activity  . Alcohol use: No    Alcohol/week: 0.0 standard drinks  . Drug use: No  . Sexual activity: Not Currently    Birth control/protection: Surgical  Other Topics Concern  . 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 Difficulty of Paying Living Expenses: Not on file  Food Insecurity:   . Worried About Charity fundraiser in the Last Year: Not on file  . Ran Out of Food in the Last Year: Not on file  Transportation Needs:   . Lack of Transportation (Medical): Not on file  . Lack of Transportation (Non-Medical): Not on file  Physical Activity:   . Days of Exercise per Week: Not on file  . Minutes of Exercise per Session: Not on file  Stress:   . Feeling of Stress : Not on file  Social Connections:   . Frequency of Communication with Friends and Family: Not on file  . Frequency of Social Gatherings with Friends and Family: Not on file  . Attends Religious Services: Not on file  . Active Member of Clubs or Organizations: Not on file  . Attends Archivist Meetings: Not on file  . Marital Status: Not on file     Family History:  The patient's family history includes Anesthesia problems in her cousin; Cancer in her cousin, cousin, cousin, and maternal grandfather.   ROS:   Please see the history of present illness.    ROS All other systems are reviewed and are negative.   PHYSICAL EXAM:   VS:  BP 130/74   Pulse 67   Ht 5' 4.5" (1.638 m)   Wt 218  lb 12.8 oz (99.2 kg)   BMI 36.98 kg/m      General: Alert, oriented x3, no distress, severely obese Head: no evidence of trauma, PERRL, EOMI, no exophtalmos or lid lag, no  myxedema, no xanthelasma; normal ears, nose and oropharynx Neck: normal jugular venous pulsations and no hepatojugular reflux; brisk carotid pulses without delay and no carotid bruits Chest: clear to auscultation, no signs of consolidation by percussion or palpation, normal fremitus, symmetrical and full respiratory excursions Cardiovascular: normal position and quality of the apical impulse, regular rhythm, normal first and second heart sounds, no murmurs, rubs or gallops Abdomen: no tenderness or distention, no masses by palpation, no abnormal pulsatility or arterial bruits, normal bowel sounds, no hepatosplenomegaly Extremities: no clubbing, cyanosis or edema; 2+ radial, ulnar and brachial pulses bilaterally; 2+ right femoral, posterior tibial and dorsalis pedis pulses; 2+ left femoral, posterior tibial and dorsalis pedis pulses; no subclavian or femoral bruits Neurological: grossly nonfocal Psych: Normal mood and affect   Wt Readings from Last 3 Encounters:  02/06/20 218 lb 12.8 oz (99.2 kg)  02/13/19 222 lb (100.7 kg)  10/26/18 220 lb 3.2 oz (99.9 kg)      Studies/Labs Reviewed:   EKG:  EKG is ordered today.  It shows normal sinus rhythm, prominent R wave in lead V1 and no repolarization abnormalities.  It is not much changed from her previous tracing in 2019 except her R wave was also prominent in lead V2.  Recent Labs: 12/20/2019 Hemoglobin A1c 5.8%, TSH 0.28 12/01/2019 Creatinine 0.85, normal liver function tests BMET    Component Value Date/Time   NA 140 03/18/2018 0457   NA 142 12/17/2014 1039   K 3.6 03/18/2018 0457   K 3.5 12/17/2014 1039   CL 103 03/18/2018 0457   CL 101 04/24/2013 0904   CO2 28 03/18/2018 0457   CO2 31 (H) 12/17/2014 1039   GLUCOSE 150 (H) 03/18/2018 0457   GLUCOSE 108 12/17/2014 1039   GLUCOSE 116 (H) 04/24/2013 0904   BUN 20 03/18/2018 0457   BUN 18.3 12/17/2014 1039   CREATININE 0.84 03/18/2018 0457   CREATININE 0.98 02/22/2015 0935    CREATININE 0.9 12/17/2014 1039   CALCIUM 9.1 03/18/2018 0457   CALCIUM 10.2 12/17/2014 1039   GFRNONAA >60 03/18/2018 0457   GFRAA >60 03/18/2018 0457   07/20/2019 Total cholesterol 174, HDL 59, LDL 93, triglycerides 112 Hemoglobin 12.8  ASSESSMENT:    1. Coronary artery disease of native artery of native heart with stable angina pectoris ([place])   2. Hypercholesterolemia   3. Essential hypertension   4. Class 2 severe obesity due to excess calories with serious comorbidity and body mass index (BMI) of 36.0 to 36.9 in adult [facility])      PLAN:  In order of problems listed above:  1. CAD: Symptoms are not entirely typical but have some features concerning for angina pectoris.  Has known moderate atherosclerosis in her coronary arteries by previous cardiac catheterization.  2 years ago she had a normal nuclear stress test.  I think we need to repeat this and compare the 2 studies. 2. HLP: Results with rosuvastatin is not bad, but her LDL is not at target.  If we identify evidence of coronary disease progression she should switch to [place] or Praluent (her brother Marc Morgans is on Repatha).   3. HTN: Adequate control 4. Obesity: Weight loss would be highly beneficial.   Medication Adjustments/Labs and Tests Ordered: Current medicines are reviewed at length with the patient  today.  Concerns regarding medicines are outlined above.  Medication changes, Labs and Tests ordered today are listed in the Patient Instructions below. Patient Instructions  Medication Instructions:  No changes  *If you need a refill on your cardiac medications before your next appointment, please call your pharmacy*  Lab Work: None ordered If you have labs (blood work) drawn today and your tests are completely normal, you will receive your results only by: Marland Kitchen MyChart Message (if you have MyChart) OR . A paper copy in the mail If you have any lab test that is abnormal or we need to change your treatment, we will call  you to review the results.  Testing/Procedures: Your physician has requested that you have a lexiscan myoview. For further information please visit [email or web address]. Please follow instruction sheet, as given. This will take place at [place], [address]  How to prepare for your Myocardial Perfusion Test:  Do not eat or drink 3 hours prior to your test, except you may have water.  Do not consume products containing caffeine (regular or decaffeinated) 12 hours prior to your test. (ex: coffee, chocolate, sodas, tea).  Do bring a list of your current medications with you.  If not listed below, you may take your medications as normal.  Do wear comfortable clothes (no dresses or overalls) and walking shoes, tennis shoes preferred (No heels or open toe shoes are allowed).  Do NOT wear cologne, perfume, aftershave, or lotions (deodorant is allowed).  The test will take approximately 3 to 4 hours to complete  If these instructions are not followed, your test will have to be rescheduled.   Follow-Up: At [facility], you and your health needs are our priority.  As part of our continuing mission to provide you with exceptional heart care, we have created designated Provider Care Teams.  These Care Teams include your primary Cardiologist (physician) and Advanced Practice Providers (APPs -  Physician Assistants and Nurse Practitioners) who all work together to provide you with the care you need, when you need it.  Your next appointment:   12 month(s)  The format for your next appointment:   In Person  Provider:   You may see Sanda Klein, MD or one of the following Advanced Practice Providers on your designated Care Team:    Almyra Deforest, PA-C  Fabian Sharp, [place] or   Roby Lofts, PA-C     Signed, Sanda Klein, MD  02/06/2020 5:58 PM    [address], [place], [place]  [address] Phone: [phone]; Fax: [phone]

## 2020-02-06 NOTE — Patient Instructions (Signed)
Medication Instructions:  No changes  *If you need a refill on your cardiac medications before your next appointment, please call your pharmacy*  Lab Work: None ordered If you have labs (blood work) drawn today and your tests are completely normal, you will receive your results only by: Marland Kitchen MyChart Message (if you have MyChart) OR . A paper copy in the mail If you have any lab test that is abnormal or we need to change your treatment, we will call you to review the results.  Testing/Procedures: Your physician has requested that you have a lexiscan myoview. For further information please visit [email or web address]. Please follow instruction sheet, as given. This will take place at [place], [address]  How to prepare for your Myocardial Perfusion Test:  Do not eat or drink 3 hours prior to your test, except you may have water.  Do not consume products containing caffeine (regular or decaffeinated) 12 hours prior to your test. (ex: coffee, chocolate, sodas, tea).  Do bring a list of your current medications with you.  If not listed below, you may take your medications as normal.  Do wear comfortable clothes (no dresses or overalls) and walking shoes, tennis shoes preferred (No heels or open toe shoes are allowed).  Do NOT wear cologne, perfume, aftershave, or lotions (deodorant is allowed).  The test will take approximately 3 to 4 hours to complete  If these instructions are not followed, your test will have to be rescheduled.   Follow-Up: At [facility], you and your health needs are our priority.  As part of our continuing mission to provide you with exceptional heart care, we have created designated Provider Care Teams.  These Care Teams include your primary Cardiologist (physician) and Advanced Practice Providers (APPs -  Physician Assistants and Nurse Practitioners) who all work together to provide you with the care you need, when you need it.  Your next appointment:    12 month(s)  The format for your next appointment:   In Person  Provider:   You may see Sanda Klein, MD or one of the following Advanced Practice Providers on your designated Care Team:    Almyra Deforest, PA-C  Fabian Sharp, PA-C or   Roby Lofts, [place]

## 2020-02-08 ENCOUNTER — Ambulatory Visit: Payer: PPO | Admitting: Cardiovascular Disease

## 2020-02-15 ENCOUNTER — Telehealth (HOSPITAL_COMMUNITY): Payer: Self-pay

## 2020-02-15 NOTE — Telephone Encounter (Signed)
Encounter complete. 

## 2020-02-20 ENCOUNTER — Other Ambulatory Visit: Payer: Self-pay

## 2020-02-20 ENCOUNTER — Ambulatory Visit (HOSPITAL_COMMUNITY)
Admission: RE | Admit: 2020-02-20 | Discharge: 2020-02-20 | Disposition: A | Discharge status: Home / Self Care | Payer: PPO | Source: Ambulatory Visit | Attending: Cardiovascular Disease | Admitting: Cardiovascular Disease

## 2020-02-20 ENCOUNTER — Other Ambulatory Visit (HOSPITAL_COMMUNITY): Payer: Self-pay

## 2020-02-20 DIAGNOSIS — I25118 Atherosclerotic heart disease of native coronary artery with other forms of angina pectoris: Secondary | ICD-10-CM | POA: Diagnosis not present

## 2020-02-20 LAB — MYOCARDIAL PERFUSION IMAGING
LV dias vol: 82 mL (ref 46–106)
LV sys vol: 29 mL
Peak HR: 99 {beats}/min
Rest HR: 63 {beats}/min
SDS: 4
SRS: 2
SSS: 6
TID: 1.1

## 2020-02-20 MED ORDER — TECHNETIUM TC 99M TETROFOSMIN IV KIT
31.2000 | PACK | Freq: Once | INTRAVENOUS | Status: AC | PRN
  Administered 2020-02-20: 31.2 via INTRAVENOUS
  Filled 2020-02-20: qty 32

## 2020-02-20 MED ORDER — TECHNETIUM TC 99M TETROFOSMIN IV KIT
9.2000 | PACK | Freq: Once | INTRAVENOUS | Status: AC | PRN
  Administered 2020-02-20: 9.2 via INTRAVENOUS
  Filled 2020-02-20: qty 10

## 2020-02-20 MED ORDER — REGADENOSON 0.4 MG/5ML IV SOLN
0.4000 mg | Freq: Once | INTRAVENOUS | Status: AC
  Administered 2020-02-20: 0.4 mg via INTRAVENOUS

## 2020-03-25 DIAGNOSIS — E785 Hyperlipidemia, unspecified: Secondary | ICD-10-CM | POA: Diagnosis not present

## 2020-03-25 DIAGNOSIS — I10 Essential (primary) hypertension: Secondary | ICD-10-CM | POA: Diagnosis not present

## 2020-03-25 DIAGNOSIS — J449 Chronic obstructive pulmonary disease, unspecified: Secondary | ICD-10-CM | POA: Diagnosis not present

## 2020-03-25 DIAGNOSIS — M25512 Pain in left shoulder: Secondary | ICD-10-CM | POA: Diagnosis not present

## 2020-03-25 DIAGNOSIS — M12812 Other specific arthropathies, not elsewhere classified, left shoulder: Secondary | ICD-10-CM | POA: Diagnosis not present

## 2020-03-25 DIAGNOSIS — M25559 Pain in unspecified hip: Secondary | ICD-10-CM | POA: Diagnosis not present

## 2020-03-25 DIAGNOSIS — M0609 Rheumatoid arthritis without rheumatoid factor, multiple sites: Secondary | ICD-10-CM | POA: Diagnosis not present

## 2020-03-25 DIAGNOSIS — M549 Dorsalgia, unspecified: Secondary | ICD-10-CM | POA: Diagnosis not present

## 2020-03-25 DIAGNOSIS — Z79899 Other long term (current) drug therapy: Secondary | ICD-10-CM | POA: Diagnosis not present

## 2020-03-25 DIAGNOSIS — M25569 Pain in unspecified knee: Secondary | ICD-10-CM | POA: Diagnosis not present

## 2020-03-28 DIAGNOSIS — G4733 Obstructive sleep apnea (adult) (pediatric): Secondary | ICD-10-CM | POA: Diagnosis not present

## 2020-03-28 DIAGNOSIS — K219 Gastro-esophageal reflux disease without esophagitis: Secondary | ICD-10-CM | POA: Diagnosis not present

## 2020-03-28 DIAGNOSIS — E1169 Type 2 diabetes mellitus with other specified complication: Secondary | ICD-10-CM | POA: Diagnosis not present

## 2020-03-28 DIAGNOSIS — I2510 Atherosclerotic heart disease of native coronary artery without angina pectoris: Secondary | ICD-10-CM | POA: Diagnosis not present

## 2020-03-28 DIAGNOSIS — J45909 Unspecified asthma, uncomplicated: Secondary | ICD-10-CM | POA: Diagnosis not present

## 2020-03-28 DIAGNOSIS — M069 Rheumatoid arthritis, unspecified: Secondary | ICD-10-CM | POA: Diagnosis not present

## 2020-03-28 DIAGNOSIS — M542 Cervicalgia: Secondary | ICD-10-CM | POA: Diagnosis not present

## 2020-03-28 DIAGNOSIS — I10 Essential (primary) hypertension: Secondary | ICD-10-CM | POA: Diagnosis not present

## 2020-03-28 DIAGNOSIS — E7849 Other hyperlipidemia: Secondary | ICD-10-CM | POA: Diagnosis not present

## 2020-03-28 DIAGNOSIS — E039 Hypothyroidism, unspecified: Secondary | ICD-10-CM | POA: Diagnosis not present

## 2020-03-28 DIAGNOSIS — G609 Hereditary and idiopathic neuropathy, unspecified: Secondary | ICD-10-CM | POA: Diagnosis not present

## 2020-03-28 DIAGNOSIS — C50919 Malignant neoplasm of unspecified site of unspecified female breast: Secondary | ICD-10-CM | POA: Diagnosis not present

## 2020-04-01 ENCOUNTER — Other Ambulatory Visit: Payer: Self-pay | Admitting: Internal Medicine

## 2020-04-01 DIAGNOSIS — R109 Unspecified abdominal pain: Secondary | ICD-10-CM

## 2020-04-16 ENCOUNTER — Ambulatory Visit
Admission: RE | Admit: 2020-04-16 | Discharge: 2020-04-16 | Disposition: A | Discharge status: Home / Self Care | Payer: PPO | Source: Ambulatory Visit | Attending: Internal Medicine | Admitting: Internal Medicine

## 2020-04-16 DIAGNOSIS — R109 Unspecified abdominal pain: Secondary | ICD-10-CM

## 2020-04-16 DIAGNOSIS — K5730 Diverticulosis of large intestine without perforation or abscess without bleeding: Secondary | ICD-10-CM | POA: Diagnosis not present

## 2020-04-16 MED ORDER — IOPAMIDOL (ISOVUE-300) INJECTION 61%
125.0000 mL | Freq: Once | INTRAVENOUS | Status: AC | PRN
  Administered 2020-04-16: 125 mL via INTRAVENOUS

## 2020-08-01 ENCOUNTER — Encounter: Payer: Self-pay | Admitting: Genetic Counselor

## 2020-08-04 NOTE — Progress Notes (Signed)
CLINIC:  Survivorship   REASON FOR VISIT:  Routine follow-up for history of breast cancer.   BRIEF ONCOLOGIC HISTORY:  Oncology History  Malignant neoplasm of upper-outer quadrant of female breast ([place])  12/02/2011 -  Neo-Adjuvant Chemotherapy   Taxotere and Cytoxan 4    04/20/2012 Initial Diagnosis   Right breast invasive ductal carcinoma with DCIS 2.5 cm with the MRI showing a maximum diameter of 7.5 cm ER/PR positive HER-2 negative Ki-67 15%    Surgery   Lumpectomy: IDC with lymphovascular invasion 1.9 cm, 2 sentinel lymph nodes negative    Radiation Therapy   Adjuvant radiation   07/21/2012 - 07/2019 Anti-estrogen oral therapy   Letrozole 2.5 mg daily       INTERVAL HISTORY:  [name] presents to the [facility] today for routine follow-up for her history of breast cancer.  Overall, she reports feeling quite well.   She completed anti estrogen therapy with Letrozole in 07/2019.  She underwent bilateral breast screening mammogram in 09/2019 that showed no evidence of malignancy and breast density category B.   Her most recent bone density test was completed in 06/2018 with Dr. Joylene Draft and was normal.  She has no osteopenia or osteoporosis.     [name] is up to date with her PCP appointments.  She is as active as she can be considering her h/o arthritis and her left knee being in need of a replacement.  She denies any new health issues or concerns this year.      REVIEW OF SYSTEMS:  Review of Systems  Constitutional: Negative for appetite change, chills, fatigue, fever and unexpected weight change.  HENT:   Negative for hearing loss, lump/mass and sore throat.   Eyes: Negative for eye problems and icterus.  Respiratory: Negative for chest tightness, cough and shortness of breath.   Cardiovascular: Negative for chest pain, leg swelling and palpitations.  Gastrointestinal: Negative for abdominal distention, abdominal pain, constipation, diarrhea, nausea and vomiting.    Endocrine: Negative for hot flashes.  Genitourinary: Negative for difficulty urinating.   Musculoskeletal: Negative for arthralgias.  Skin: Negative for itching and rash.  Neurological: Negative for dizziness, extremity weakness, headaches and numbness.  Hematological: Negative for adenopathy. Does not bruise/bleed easily.  Psychiatric/Behavioral: Negative for depression. The patient is not nervous/anxious.    Breast: Denies any new nodularity, masses, tenderness, nipple changes, or nipple discharge.       PAST MEDICAL/SURGICAL HISTORY:  Past Medical History:  Diagnosis Date  . Anemia    history of  . Arthritis    osteo - s/p right total knee  . Asthma    daily and prn inhalers  . Boil, axilla   . Breast cancer ([place]) 10/28/11   right breast  . CAD (coronary artery disease)   . Colon polyp   . GERD (gastroesophageal reflux disease)   . Grave's disease    no current meds.  . H pylori ulcer   . Heart murmur   . History of blood transfusion 2006  . History of chemotherapy    neoadjuvant: 5 cycles of Taxol/carboplatinum:  last dose 02/24/12  . History of hiatal hernia   . Hyperlipidemia   . Hypertension    under control with med.  . Hypothyroid   . Liver cyst    History of   . Pre-diabetes   . RA (rheumatoid arthritis) ([place])   . S/P chemotherapy, time since 4-12 weeks finished 02/20/2011  . S/P radiation therapy    Right Breast  High Axilla and Supraclavicular gegion: 4500 cGy/25 Fractions with boost for a total of 6300 cGy  . Sleep apnea sleep study 09/18/2005   uses CPAP occ. - to bring machine DOS  . Spondylosis    cervical and lumbar  . Use of letrozole ([place]) start 07/21/12   Past Surgical History:  Procedure Laterality Date  . ABDOMINAL HYSTERECTOMY    . BILATERAL OOPHORECTOMY  2009  . Boil removal under arm Right   . BREAST BIOPSY Bilateral 1998  . BREAST LUMPECTOMY Right 03/2012  . CARDIAC CATHETERIZATION  04/10/2010, 05/07/2004   LAD lesion, blood flow OK,  per pt.  Marland Kitchen CARDIOVASCULAR STRESS TEST  02/15/2018  . CARPAL TUNNEL RELEASE     left  . CARPAL TUNNEL RELEASE Right   . CATARACT EXTRACTION, BILATERAL    . COLONOSCOPY  12/20/2012   Procedure: COLONOSCOPY;  Surgeon: Lafayette Dragon, MD;  Location: [facility];  Service: Endoscopy;  Laterality: N/A;  . ESOPHAGOGASTRODUODENOSCOPY  12/20/2012   Procedure: ESOPHAGOGASTRODUODENOSCOPY (EGD);  Surgeon: Lafayette Dragon, MD;  Location: [facility];  Service: Endoscopy;  Laterality: N/A;  . LESION REMOVAL  11/27/2011   Procedure: LESION REMOVAL;  Surgeon: Adin Hector, MD;  Location: [facility];  Service: General;  Laterality: Right;  Skin tag removed from under right eye. No specimen  . LUMBAR LAMINECTOMY     back surg. x 2 - 1980's and 1999  . MASTECTOMY PARTIAL / LUMPECTOMY  04/01/12   right breast, inv ductal ca, ER/PR  +, HER 2 -   . NM MYOVIEW LTD  08/02/2012   no ischemia  . PARTIAL HYSTERECTOMY     BSO  . PORTACATH PLACEMENT  11/27/2011   Procedure: INSERTION PORT-A-CATH;  Surgeon: Adin Hector, MD;  Location: [facility];  Service: General;  Laterality: Right;  . TOTAL HIP ARTHROPLASTY Right 03/17/2018   Procedure: RIGHT TOTAL HIP ARTHROPLASTY ANTERIOR APPROACH;  Surgeon: Rod Can, MD;  Location: [facility];  Service: Orthopedics;  Laterality: Right;  Needs RNFA  . TOTAL KNEE ARTHROPLASTY  07/28/2006   right  . US ECHOCARDIOGRAPHY  08/14/2008   technically difficult-mild LVH, EF =>55%,mild annular ca+,AOV mildly sclerotic     ALLERGIES:  Allergies  Allergen Reactions  . Crestor [Rosuvastatin] Other (See Comments)    crampin of legs and feet--pt is currently taking.  . Lactose Intolerance (Gi) Other (See Comments)    ABD. CRAMPS  . Lipitor [Atorvastatin Calcium] Other (See Comments)    LEG CRAMPS  . Zocor [Simvastatin - High Dose] Other (See Comments)    Leg cramps     CURRENT MEDICATIONS:  Outpatient Encounter Medications as of 08/05/2020    Medication Sig  . aspirin 81 MG chewable tablet Chew 1 tablet (81 mg total) by mouth 2 (two) times daily.  . Cholecalciferol (VITAMIN D-3) 5000 units TABS Take 5,000 Units by mouth daily.  . diclofenac sodium (VOLTAREN) 1 % GEL Apply 2-4 g topically 4 (four) times daily as needed. For joint pain.  Marland Kitchen diltiazem (CARDIZEM CD) 240 MG 24 hr capsule Take 240 mg by mouth daily.  Marland Kitchen docusate sodium (COLACE) 100 MG capsule Take 1 capsule (100 mg total) by mouth 2 (two) times daily.  Marland Kitchen ezetimibe (ZETIA) 10 MG tablet Take 10 mg by mouth every evening.   . Fluticasone-Salmeterol (ADVAIR) 100-50 MCG/DOSE AEPB Inhale 1 puff into the lungs daily.   . folic acid (FOLVITE) 1 MG tablet Take 1 mg by mouth daily.  Marland Kitchen  gabapentin (NEURONTIN) 100 MG capsule Take 3 capsules (300 mg total) by mouth at bedtime.  Marland Kitchen levothyroxine (SYNTHROID, LEVOTHROID) 75 MCG tablet Take 75 mcg by mouth daily before breakfast.   . Methotrexate (XATMEP PO) methotrexate  . omeprazole (PRILOSEC) 20 MG capsule Take 20 mg by mouth daily.  . ondansetron (ZOFRAN) 4 MG tablet Take 1 tablet (4 mg total) by mouth every 6 (six) hours as needed for nausea.  . rosuvastatin (CRESTOR) 10 MG tablet TAKE 1 TABLET (10 MG TOTAL) BY MOUTH 3 (THREE) TIMES A WEEK. MONDAYS, WEDNESDAYS & FRIDAYS.  Marland Kitchen TIADYLT ER 360 MG 24 hr capsule Take 360 mg by mouth at bedtime.  . Tiotropium Bromide Monohydrate (SPIRIVA RESPIMAT IN) Spiriva with HandiHaler  . Tiotropium Bromide Monohydrate (SPIRIVA RESPIMAT) 1.25 MCG/ACT AERS Inhale 2.5 mcg into the lungs daily as needed (for respiratory issues.).  Marland Kitchen traMADol (ULTRAM) 50 MG tablet Take by mouth every 6 (six) hours as needed.  . triamterene-hydrochlorothiazide (MAXZIDE-25) 37.5-25 MG per tablet Take 1 tablet by mouth daily.    No facility-administered encounter medications on file as of 08/05/2020.     ONCOLOGIC FAMILY HISTORY:  Family History  Problem Relation Age of Onset  . Cancer Maternal Grandfather        unaware   . Anesthesia problems Cousin        pt. states she is not aware of any family anes. prob., does not know where this comment came from  . Cancer Cousin        breast, brain mets  . Cancer Cousin        ovarian, age 86-50  . Cancer Cousin        pancreatic  . Colon cancer Neg Hx   . Esophageal cancer Neg Hx   . Rectal cancer Neg Hx   . Stomach cancer Neg Hx     GENETIC COUNSELING/TESTING: See above  SOCIAL HISTORY:  Social History   Socioeconomic History  . Marital status: Single    Spouse name: Not on file  . Number of children: Not on file  . Years of education: Not on file  . Highest education level: Not on file  Occupational History  . Occupation: retired    Fish farm manager: RETIRED  Tobacco Use  . Smoking status: Never Smoker  . Smokeless tobacco: Never Used  Vaping Use  . Vaping Use: Never used  Substance and Sexual Activity  . Alcohol use: No    Alcohol/week: 0.0 standard drinks  . Drug use: No  . Sexual activity: Not Currently    Birth control/protection: Surgical  Other Topics Concern  . Not on file  Social History Narrative   Retired ED nurse from [employer], single, 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      G0   Social Determinants of Health   Financial Resource Strain:   . Difficulty of Paying Living Expenses:   Food Insecurity:   . Worried About Charity fundraiser in the Last Year:   . Arboriculturist in the Last Year:   Transportation Needs:   . Film/video editor (Medical):   Marland Kitchen Lack of Transportation (Non-Medical):   Physical Activity:   . Days of Exercise per Week:   . Minutes of Exercise per Session:   Stress:   . Feeling of Stress :   Social Connections:   . Frequency of Communication with Friends and Family:   . Frequency of Social Gatherings with Friends and Family:   . Attends Religious Services:   .  Active Member of Clubs or Organizations:   . Attends Archivist Meetings:   Marland Kitchen Marital Status:   Intimate Partner Violence:   . Fear of  Current or Ex-Partner:   . Emotionally Abused:   Marland Kitchen Physically Abused:   . Sexually Abused:      PHYSICAL EXAMINATION:  Vital Signs: There were no vitals filed for this visit. There were no vitals filed for this visit. General: Well-nourished, well-appearing female in no acute distress.  Unaccompanied today.   HEENT: Head is normocephalic.  Pupils equal and reactive to light. Conjunctivae clear without exudate.  Sclerae anicteric. Oral mucosa is pink, moist.  Oropharynx is pink without lesions or erythema.  Lymph: No cervical, supraclavicular, or infraclavicular lymphadenopathy noted on palpation.  Cardiovascular: Regular rate and rhythm.Marland Kitchen Respiratory: Clear to auscultation bilaterally. Chest expansion symmetric; breathing non-labored.  Breast Exam:  -Left breast: No appreciable masses on palpation. No skin redness, thickening, or peau d'orange appearance; no nipple retraction or nipple discharge;   -Right breast: No appreciable masses on palpation. No skin redness, thickening, or peau d'orange appearance; no nipple retraction or nipple discharge; mild distortion in symmetry at previous lumpectomy site well healed scar without erythema or nodularity. -Axilla: No axillary adenopathy bilaterally.  GI: Abdomen soft and round; non-tender, non-distended. Bowel sounds normoactive. No hepatosplenomegaly.   GU: Deferred.  Neuro: No focal deficits. Steady gait.  Psych: Mood and affect normal and appropriate for situation.  MSK: No focal spinal tenderness to palpation, full range of motion in bilateral upper extremities Extremities: No edema. Skin: Warm and dry.  LABORATORY DATA:  None for this visit   DIAGNOSTIC IMAGING:  Most recent mammogram:  CLINICAL DATA:  Screening.  EXAM: DIGITAL SCREENING BILATERAL MAMMOGRAM WITH TOMO AND CAD  COMPARISON:  Previous exam(s).  ACR Breast Density Category b: There are scattered areas of fibroglandular density.  FINDINGS: There are no  findings suspicious for malignancy. Images were processed with CAD.  IMPRESSION: No mammographic evidence of malignancy. A result letter of this screening mammogram will be mailed directly to the patient.  RECOMMENDATION: Screening mammogram in one year. (Code:SM-B-01Y)  BI-RADS CATEGORY  1: Negative.   Electronically Signed   By: Claudie Revering M.D.   On: 10/17/2019 09:23   ASSESSMENT AND PLAN:  [name] is a pleasant 79 y.o. female with history of Stage IIB right breast invasive ductal carcinoma, ER+/PR+/HER2-, diagnosed in 11/2011, treated with neoadjuvant chemotherapy, lumpectomy, adjuvant radiation therapy, and anti-estrogen therapy with Letrozole x 7 years completing therapy in 07/2019.  [facility] for [facility] and routine follow-up.   1. History of breast cancer:  [name] is currently clinically and radiographically without evidence of disease or recurrence of breast cancer. She will be due for mammogram in 09/2020.   I reviewed with her the role of LTS clinic with her and she would like to f/u with [place] annually, which we are happy to accommodate.  I encouraged her to call me with any questions or concerns before her next visit at the cancer center, and I would be happy to see her sooner, if needed.    2. Bone health:  Given [name]'s age, history of breast cancer, and her previous use of Letrozole; she is at increased risk for bone demineralization.  Her most recent bone density in 06/2018 with Dr. Joylene Draft was normal.  She will continue to f/u with him for future bone density testing and management.  She was given education on specific  food and activities to promote bone health.  3. Cancer screening:  Due to [name]'s history and her age, she should receive screening for skin cancers, colon cancers. She was encouraged to follow-up with her PCP for appropriate cancer screenings.   4. Health maintenance and wellness promotion: [name] was  encouraged to consume 5-7 servings of fruits and vegetables per day. She was also encouraged to engage in moderate to vigorous exercise for 30 minutes per day most days of the week. She was instructed to limit her alcohol consumption and continue to abstain from tobacco use.    Dispo:  -Return to cancer center in one year for LTS follow up -Mammogram due in 09/2020   Total encounter time: 20 minutes*  Wilber Bihari, NP 08/04/20 2:11 PM Medical Oncology and Hematology [facility] [place], [place] [address] Tel. [phone]    Fax. [phone]  *Total Encounter Time as defined by the Centers for Medicare and Medicaid Services includes, in addition to the face-to-face time of a patient visit (documented in the note above) non-face-to-face time: obtaining and reviewing outside history, ordering and reviewing medications, tests or procedures, care coordination (communications with other health care professionals or caregivers) and documentation in the medical record.    Note: PRIMARY CARE PROVIDER [name], [place] [phone]

## 2020-08-05 ENCOUNTER — Inpatient Hospital Stay: Payer: PPO | Attending: Adult Health | Admitting: Adult Health

## 2020-08-05 ENCOUNTER — Encounter: Payer: Self-pay | Admitting: Adult Health

## 2020-08-05 ENCOUNTER — Other Ambulatory Visit: Payer: Self-pay

## 2020-08-05 VITALS — BP 108/62 | HR 79 | Temp 97.6°F | Resp 20 | Ht 65.0 in | Wt 227.8 lb

## 2020-08-05 DIAGNOSIS — Z79899 Other long term (current) drug therapy: Secondary | ICD-10-CM | POA: Diagnosis not present

## 2020-08-05 DIAGNOSIS — Z170 Estrogen receptor positive status [ER+]: Secondary | ICD-10-CM

## 2020-08-05 DIAGNOSIS — Z853 Personal history of malignant neoplasm of breast: Secondary | ICD-10-CM | POA: Diagnosis not present

## 2020-08-05 DIAGNOSIS — E039 Hypothyroidism, unspecified: Secondary | ICD-10-CM | POA: Diagnosis not present

## 2020-08-05 DIAGNOSIS — I10 Essential (primary) hypertension: Secondary | ICD-10-CM | POA: Insufficient documentation

## 2020-08-05 DIAGNOSIS — C50411 Malignant neoplasm of upper-outer quadrant of right female breast: Secondary | ICD-10-CM

## 2020-08-05 DIAGNOSIS — Z923 Personal history of irradiation: Secondary | ICD-10-CM | POA: Diagnosis not present

## 2020-08-05 DIAGNOSIS — M069 Rheumatoid arthritis, unspecified: Secondary | ICD-10-CM | POA: Insufficient documentation

## 2020-08-05 DIAGNOSIS — Z9221 Personal history of antineoplastic chemotherapy: Secondary | ICD-10-CM | POA: Diagnosis not present

## 2020-08-05 DIAGNOSIS — Z8719 Personal history of other diseases of the digestive system: Secondary | ICD-10-CM | POA: Diagnosis not present

## 2020-08-06 ENCOUNTER — Telehealth: Payer: Self-pay | Admitting: Adult Health

## 2020-08-06 NOTE — Telephone Encounter (Signed)
Scheduled appts per 8/16 los. Pt confirmed appt date and time.

## 2020-08-14 DIAGNOSIS — Z961 Presence of intraocular lens: Secondary | ICD-10-CM | POA: Diagnosis not present

## 2020-08-14 DIAGNOSIS — H43813 Vitreous degeneration, bilateral: Secondary | ICD-10-CM | POA: Diagnosis not present

## 2020-08-14 DIAGNOSIS — H524 Presbyopia: Secondary | ICD-10-CM | POA: Diagnosis not present

## 2020-08-14 DIAGNOSIS — E119 Type 2 diabetes mellitus without complications: Secondary | ICD-10-CM | POA: Diagnosis not present

## 2020-08-14 DIAGNOSIS — M0570 Rheumatoid arthritis with rheumatoid factor of unspecified site without organ or systems involvement: Secondary | ICD-10-CM | POA: Diagnosis not present

## 2020-08-23 DIAGNOSIS — E785 Hyperlipidemia, unspecified: Secondary | ICD-10-CM | POA: Diagnosis not present

## 2020-08-23 DIAGNOSIS — E039 Hypothyroidism, unspecified: Secondary | ICD-10-CM | POA: Diagnosis not present

## 2020-08-23 DIAGNOSIS — E559 Vitamin D deficiency, unspecified: Secondary | ICD-10-CM | POA: Diagnosis not present

## 2020-08-23 DIAGNOSIS — E1169 Type 2 diabetes mellitus with other specified complication: Secondary | ICD-10-CM | POA: Diagnosis not present

## 2020-08-30 DIAGNOSIS — E039 Hypothyroidism, unspecified: Secondary | ICD-10-CM | POA: Diagnosis not present

## 2020-08-30 DIAGNOSIS — R700 Elevated erythrocyte sedimentation rate: Secondary | ICD-10-CM | POA: Diagnosis not present

## 2020-08-30 DIAGNOSIS — Z1212 Encounter for screening for malignant neoplasm of rectum: Secondary | ICD-10-CM | POA: Diagnosis not present

## 2020-08-30 DIAGNOSIS — E1169 Type 2 diabetes mellitus with other specified complication: Secondary | ICD-10-CM | POA: Diagnosis not present

## 2020-08-30 DIAGNOSIS — J45909 Unspecified asthma, uncomplicated: Secondary | ICD-10-CM | POA: Diagnosis not present

## 2020-08-30 DIAGNOSIS — E559 Vitamin D deficiency, unspecified: Secondary | ICD-10-CM | POA: Diagnosis not present

## 2020-08-30 DIAGNOSIS — M1990 Unspecified osteoarthritis, unspecified site: Secondary | ICD-10-CM | POA: Diagnosis not present

## 2020-08-30 DIAGNOSIS — E785 Hyperlipidemia, unspecified: Secondary | ICD-10-CM | POA: Diagnosis not present

## 2020-08-30 DIAGNOSIS — I10 Essential (primary) hypertension: Secondary | ICD-10-CM | POA: Diagnosis not present

## 2020-08-30 DIAGNOSIS — R062 Wheezing: Secondary | ICD-10-CM | POA: Diagnosis not present

## 2020-08-30 DIAGNOSIS — R011 Cardiac murmur, unspecified: Secondary | ICD-10-CM | POA: Diagnosis not present

## 2020-08-30 DIAGNOSIS — G4733 Obstructive sleep apnea (adult) (pediatric): Secondary | ICD-10-CM | POA: Diagnosis not present

## 2020-08-30 DIAGNOSIS — Z0000 Encounter for general adult medical examination without abnormal findings: Secondary | ICD-10-CM | POA: Diagnosis not present

## 2020-09-02 ENCOUNTER — Ambulatory Visit: Payer: PPO | Attending: Internal Medicine

## 2020-09-02 DIAGNOSIS — Z23 Encounter for immunization: Secondary | ICD-10-CM

## 2020-09-02 NOTE — Progress Notes (Signed)
   Covid-19 Vaccination Clinic  Name:  [name]    MRN: [id number] DOB: [birth year]  09/02/2020  [name] was observed post Covid-19 immunization for 15 minutes without incident. She was provided with Vaccine Information Sheet and instruction to access the V-Safe system.   [name] was instructed to call 911 with any severe reactions post vaccine: Marland Kitchen Difficulty breathing  . Swelling of face and throat  . A fast heartbeat  . A bad rash all over body  . Dizziness and weakness

## 2020-09-04 DIAGNOSIS — Z6838 Body mass index (BMI) 38.0-38.9, adult: Secondary | ICD-10-CM | POA: Diagnosis not present

## 2020-09-04 DIAGNOSIS — Z124 Encounter for screening for malignant neoplasm of cervix: Secondary | ICD-10-CM | POA: Diagnosis not present

## 2020-09-05 DIAGNOSIS — Z124 Encounter for screening for malignant neoplasm of cervix: Secondary | ICD-10-CM | POA: Diagnosis not present

## 2020-09-28 DIAGNOSIS — Z23 Encounter for immunization: Secondary | ICD-10-CM | POA: Diagnosis not present

## 2020-10-02 DIAGNOSIS — M25569 Pain in unspecified knee: Secondary | ICD-10-CM | POA: Diagnosis not present

## 2020-10-02 DIAGNOSIS — J449 Chronic obstructive pulmonary disease, unspecified: Secondary | ICD-10-CM | POA: Diagnosis not present

## 2020-10-02 DIAGNOSIS — M12812 Other specific arthropathies, not elsewhere classified, left shoulder: Secondary | ICD-10-CM | POA: Diagnosis not present

## 2020-10-02 DIAGNOSIS — E785 Hyperlipidemia, unspecified: Secondary | ICD-10-CM | POA: Diagnosis not present

## 2020-10-02 DIAGNOSIS — I10 Essential (primary) hypertension: Secondary | ICD-10-CM | POA: Diagnosis not present

## 2020-10-02 DIAGNOSIS — M0609 Rheumatoid arthritis without rheumatoid factor, multiple sites: Secondary | ICD-10-CM | POA: Diagnosis not present

## 2020-10-02 DIAGNOSIS — M549 Dorsalgia, unspecified: Secondary | ICD-10-CM | POA: Diagnosis not present

## 2020-10-02 DIAGNOSIS — Z79899 Other long term (current) drug therapy: Secondary | ICD-10-CM | POA: Diagnosis not present

## 2020-10-09 ENCOUNTER — Other Ambulatory Visit: Payer: Self-pay | Admitting: Internal Medicine

## 2020-10-09 DIAGNOSIS — Z1231 Encounter for screening mammogram for malignant neoplasm of breast: Secondary | ICD-10-CM

## 2020-10-29 DIAGNOSIS — E039 Hypothyroidism, unspecified: Secondary | ICD-10-CM | POA: Diagnosis not present

## 2020-11-12 ENCOUNTER — Other Ambulatory Visit: Payer: Self-pay

## 2020-11-12 ENCOUNTER — Ambulatory Visit
Admission: RE | Admit: 2020-11-12 | Discharge: 2020-11-12 | Disposition: A | Discharge status: Home / Self Care | Payer: PPO | Source: Ambulatory Visit | Attending: Internal Medicine | Admitting: Internal Medicine

## 2020-11-12 DIAGNOSIS — Z1231 Encounter for screening mammogram for malignant neoplasm of breast: Secondary | ICD-10-CM

## 2020-11-29 DIAGNOSIS — J40 Bronchitis, not specified as acute or chronic: Secondary | ICD-10-CM | POA: Diagnosis not present

## 2020-11-29 DIAGNOSIS — J309 Allergic rhinitis, unspecified: Secondary | ICD-10-CM | POA: Diagnosis not present

## 2020-11-29 DIAGNOSIS — J029 Acute pharyngitis, unspecified: Secondary | ICD-10-CM | POA: Diagnosis not present

## 2020-11-29 DIAGNOSIS — R059 Cough, unspecified: Secondary | ICD-10-CM | POA: Diagnosis not present

## 2020-11-29 DIAGNOSIS — Z1152 Encounter for screening for COVID-19: Secondary | ICD-10-CM | POA: Diagnosis not present

## 2020-11-29 DIAGNOSIS — J45909 Unspecified asthma, uncomplicated: Secondary | ICD-10-CM | POA: Diagnosis not present

## 2021-01-02 DIAGNOSIS — Z79899 Other long term (current) drug therapy: Secondary | ICD-10-CM | POA: Diagnosis not present

## 2021-01-02 DIAGNOSIS — M0609 Rheumatoid arthritis without rheumatoid factor, multiple sites: Secondary | ICD-10-CM | POA: Diagnosis not present

## 2021-02-28 DIAGNOSIS — D649 Anemia, unspecified: Secondary | ICD-10-CM | POA: Diagnosis not present

## 2021-02-28 DIAGNOSIS — E039 Hypothyroidism, unspecified: Secondary | ICD-10-CM | POA: Diagnosis not present

## 2021-02-28 DIAGNOSIS — M069 Rheumatoid arthritis, unspecified: Secondary | ICD-10-CM | POA: Diagnosis not present

## 2021-02-28 DIAGNOSIS — E1169 Type 2 diabetes mellitus with other specified complication: Secondary | ICD-10-CM | POA: Diagnosis not present

## 2021-02-28 DIAGNOSIS — G4733 Obstructive sleep apnea (adult) (pediatric): Secondary | ICD-10-CM | POA: Diagnosis not present

## 2021-02-28 DIAGNOSIS — I10 Essential (primary) hypertension: Secondary | ICD-10-CM | POA: Diagnosis not present

## 2021-02-28 DIAGNOSIS — M1990 Unspecified osteoarthritis, unspecified site: Secondary | ICD-10-CM | POA: Diagnosis not present

## 2021-02-28 DIAGNOSIS — I2510 Atherosclerotic heart disease of native coronary artery without angina pectoris: Secondary | ICD-10-CM | POA: Diagnosis not present

## 2021-02-28 DIAGNOSIS — J45909 Unspecified asthma, uncomplicated: Secondary | ICD-10-CM | POA: Diagnosis not present

## 2021-02-28 DIAGNOSIS — K219 Gastro-esophageal reflux disease without esophagitis: Secondary | ICD-10-CM | POA: Diagnosis not present

## 2021-03-20 DIAGNOSIS — M1990 Unspecified osteoarthritis, unspecified site: Secondary | ICD-10-CM | POA: Diagnosis not present

## 2021-03-20 DIAGNOSIS — I10 Essential (primary) hypertension: Secondary | ICD-10-CM | POA: Diagnosis not present

## 2021-03-20 DIAGNOSIS — K219 Gastro-esophageal reflux disease without esophagitis: Secondary | ICD-10-CM | POA: Diagnosis not present

## 2021-03-20 DIAGNOSIS — G4733 Obstructive sleep apnea (adult) (pediatric): Secondary | ICD-10-CM | POA: Diagnosis not present

## 2021-03-20 DIAGNOSIS — E785 Hyperlipidemia, unspecified: Secondary | ICD-10-CM | POA: Diagnosis not present

## 2021-03-20 DIAGNOSIS — E1169 Type 2 diabetes mellitus with other specified complication: Secondary | ICD-10-CM | POA: Diagnosis not present

## 2021-03-20 DIAGNOSIS — I2510 Atherosclerotic heart disease of native coronary artery without angina pectoris: Secondary | ICD-10-CM | POA: Diagnosis not present

## 2021-03-20 DIAGNOSIS — E039 Hypothyroidism, unspecified: Secondary | ICD-10-CM | POA: Diagnosis not present

## 2021-03-25 DIAGNOSIS — Z1152 Encounter for screening for COVID-19: Secondary | ICD-10-CM | POA: Diagnosis not present

## 2021-03-25 DIAGNOSIS — G4733 Obstructive sleep apnea (adult) (pediatric): Secondary | ICD-10-CM | POA: Diagnosis not present

## 2021-03-25 DIAGNOSIS — J029 Acute pharyngitis, unspecified: Secondary | ICD-10-CM | POA: Diagnosis not present

## 2021-03-25 DIAGNOSIS — M069 Rheumatoid arthritis, unspecified: Secondary | ICD-10-CM | POA: Diagnosis not present

## 2021-04-02 DIAGNOSIS — J449 Chronic obstructive pulmonary disease, unspecified: Secondary | ICD-10-CM | POA: Diagnosis not present

## 2021-04-02 DIAGNOSIS — M0609 Rheumatoid arthritis without rheumatoid factor, multiple sites: Secondary | ICD-10-CM | POA: Diagnosis not present

## 2021-04-02 DIAGNOSIS — M549 Dorsalgia, unspecified: Secondary | ICD-10-CM | POA: Diagnosis not present

## 2021-04-02 DIAGNOSIS — I10 Essential (primary) hypertension: Secondary | ICD-10-CM | POA: Diagnosis not present

## 2021-04-02 DIAGNOSIS — M25569 Pain in unspecified knee: Secondary | ICD-10-CM | POA: Diagnosis not present

## 2021-04-02 DIAGNOSIS — E785 Hyperlipidemia, unspecified: Secondary | ICD-10-CM | POA: Diagnosis not present

## 2021-04-02 DIAGNOSIS — M12812 Other specific arthropathies, not elsewhere classified, left shoulder: Secondary | ICD-10-CM | POA: Diagnosis not present

## 2021-04-02 DIAGNOSIS — Z79899 Other long term (current) drug therapy: Secondary | ICD-10-CM | POA: Diagnosis not present

## 2021-04-17 ENCOUNTER — Telehealth: Payer: Self-pay | Admitting: Cardiovascular Disease

## 2021-04-17 MED ORDER — ROSUVASTATIN CALCIUM 10 MG PO TABS
10.0000 mg | ORAL_TABLET | ORAL | 0 refills | Status: DC
Start: 2021-04-18 — End: 2021-10-01

## 2021-04-17 MED ORDER — TRIAMTERENE-HCTZ 37.5-25 MG PO TABS
1.0000 | ORAL_TABLET | Freq: Every day | ORAL | 0 refills | Status: DC
Start: 2021-04-17 — End: 2021-06-02

## 2021-04-17 MED ORDER — DILTIAZEM HCL ER COATED BEADS 240 MG PO CP24
240.0000 mg | ORAL_CAPSULE | Freq: Every day | ORAL | 0 refills | Status: DC
Start: 2021-04-17 — End: 2021-06-06

## 2021-04-17 NOTE — Telephone Encounter (Signed)
   *  STAT* If patient is at the pharmacy, call can be transferred to refill team.   1. Which medications need to be refilled? (please list name of each medication and dose if known)   diltiazem (CARDIZEM CD) 240 MG 24 hr capsule    rosuvastatin (CRESTOR) 10 MG tablet    triamterene-hydrochlorothiazide (MAXZIDE-25) 37.5-25 MG per tablet    2. Which pharmacy/location (including street and city if local pharmacy) is medication to be sent to? [facility]  3. Do they need a 30 day or 90 day supply? 90 days   Please sent all future [facility]ills to [facility]

## 2021-04-19 DIAGNOSIS — E1169 Type 2 diabetes mellitus with other specified complication: Secondary | ICD-10-CM | POA: Diagnosis not present

## 2021-04-19 DIAGNOSIS — K219 Gastro-esophageal reflux disease without esophagitis: Secondary | ICD-10-CM | POA: Diagnosis not present

## 2021-04-19 DIAGNOSIS — I10 Essential (primary) hypertension: Secondary | ICD-10-CM | POA: Diagnosis not present

## 2021-04-19 DIAGNOSIS — E039 Hypothyroidism, unspecified: Secondary | ICD-10-CM | POA: Diagnosis not present

## 2021-05-07 ENCOUNTER — Ambulatory Visit: Payer: PPO | Attending: Internal Medicine

## 2021-05-07 DIAGNOSIS — Z23 Encounter for immunization: Secondary | ICD-10-CM

## 2021-05-07 NOTE — Progress Notes (Signed)
   Covid-19 Vaccination Clinic  Name:  [name]    MRN: [id number] DOB: [birth year]  05/07/2021  [name] was observed post Covid-19 immunization for 15 minutes without incident. She was provided with Vaccine Information Sheet and instruction to access the V-Safe system.   [name] was instructed to call 911 with any severe reactions post vaccine: Marland Kitchen Difficulty breathing  . Swelling of face and throat  . A fast heartbeat  . A bad rash all over body  . Dizziness and weakness   Immunizations Administered    Name Date Dose VIS Date Route   PFIZER Comrnaty(Gray TOP) Covid-19 Vaccine 05/07/2021  9:53 AM 0.3 mL 11/28/2020 Intramuscular   Manufacturer: [employer]   Lot: [id number]   NDC: [phone]

## 2021-05-08 ENCOUNTER — Other Ambulatory Visit (HOSPITAL_COMMUNITY): Payer: Self-pay

## 2021-05-08 MED ORDER — COVID-19 MRNA VAC-TRIS(PFIZER) 30 MCG/0.3ML IM SUSP
INTRAMUSCULAR | 0 refills | Status: DC
  Filled 2021-05-08: qty 0.3, 17d supply, fill #0

## 2021-05-09 ENCOUNTER — Other Ambulatory Visit (HOSPITAL_COMMUNITY): Payer: Self-pay

## 2021-05-13 ENCOUNTER — Other Ambulatory Visit (HOSPITAL_COMMUNITY): Payer: Self-pay

## 2021-05-20 DIAGNOSIS — K219 Gastro-esophageal reflux disease without esophagitis: Secondary | ICD-10-CM | POA: Diagnosis not present

## 2021-05-20 DIAGNOSIS — E039 Hypothyroidism, unspecified: Secondary | ICD-10-CM | POA: Diagnosis not present

## 2021-05-20 DIAGNOSIS — E1169 Type 2 diabetes mellitus with other specified complication: Secondary | ICD-10-CM | POA: Diagnosis not present

## 2021-05-20 DIAGNOSIS — I10 Essential (primary) hypertension: Secondary | ICD-10-CM | POA: Diagnosis not present

## 2021-06-02 ENCOUNTER — Other Ambulatory Visit: Payer: Self-pay | Admitting: Cardiovascular Disease

## 2021-06-06 ENCOUNTER — Other Ambulatory Visit: Payer: Self-pay | Admitting: Cardiovascular Disease

## 2021-06-19 DIAGNOSIS — E1169 Type 2 diabetes mellitus with other specified complication: Secondary | ICD-10-CM | POA: Diagnosis not present

## 2021-06-19 DIAGNOSIS — I10 Essential (primary) hypertension: Secondary | ICD-10-CM | POA: Diagnosis not present

## 2021-06-19 DIAGNOSIS — E039 Hypothyroidism, unspecified: Secondary | ICD-10-CM | POA: Diagnosis not present

## 2021-06-19 DIAGNOSIS — K219 Gastro-esophageal reflux disease without esophagitis: Secondary | ICD-10-CM | POA: Diagnosis not present

## 2021-06-27 DIAGNOSIS — Z1152 Encounter for screening for COVID-19: Secondary | ICD-10-CM | POA: Diagnosis not present

## 2021-06-27 DIAGNOSIS — I10 Essential (primary) hypertension: Secondary | ICD-10-CM | POA: Diagnosis not present

## 2021-06-27 DIAGNOSIS — J069 Acute upper respiratory infection, unspecified: Secondary | ICD-10-CM | POA: Diagnosis not present

## 2021-06-27 DIAGNOSIS — R051 Acute cough: Secondary | ICD-10-CM | POA: Diagnosis not present

## 2021-07-02 DIAGNOSIS — Z79899 Other long term (current) drug therapy: Secondary | ICD-10-CM | POA: Diagnosis not present

## 2021-07-20 DIAGNOSIS — I10 Essential (primary) hypertension: Secondary | ICD-10-CM | POA: Diagnosis not present

## 2021-07-20 DIAGNOSIS — E1169 Type 2 diabetes mellitus with other specified complication: Secondary | ICD-10-CM | POA: Diagnosis not present

## 2021-07-20 DIAGNOSIS — K219 Gastro-esophageal reflux disease without esophagitis: Secondary | ICD-10-CM | POA: Diagnosis not present

## 2021-07-20 DIAGNOSIS — E039 Hypothyroidism, unspecified: Secondary | ICD-10-CM | POA: Diagnosis not present

## 2021-08-08 ENCOUNTER — Inpatient Hospital Stay: Payer: PPO | Admitting: Adult Health

## 2021-08-20 DIAGNOSIS — K219 Gastro-esophageal reflux disease without esophagitis: Secondary | ICD-10-CM | POA: Diagnosis not present

## 2021-08-20 DIAGNOSIS — I10 Essential (primary) hypertension: Secondary | ICD-10-CM | POA: Diagnosis not present

## 2021-08-20 DIAGNOSIS — E039 Hypothyroidism, unspecified: Secondary | ICD-10-CM | POA: Diagnosis not present

## 2021-08-20 DIAGNOSIS — E1169 Type 2 diabetes mellitus with other specified complication: Secondary | ICD-10-CM | POA: Diagnosis not present

## 2021-08-21 DIAGNOSIS — H5213 Myopia, bilateral: Secondary | ICD-10-CM | POA: Diagnosis not present

## 2021-08-21 DIAGNOSIS — H43813 Vitreous degeneration, bilateral: Secondary | ICD-10-CM | POA: Diagnosis not present

## 2021-08-21 DIAGNOSIS — Z961 Presence of intraocular lens: Secondary | ICD-10-CM | POA: Diagnosis not present

## 2021-08-21 DIAGNOSIS — H52223 Regular astigmatism, bilateral: Secondary | ICD-10-CM | POA: Diagnosis not present

## 2021-08-21 DIAGNOSIS — E119 Type 2 diabetes mellitus without complications: Secondary | ICD-10-CM | POA: Diagnosis not present

## 2021-08-21 DIAGNOSIS — H524 Presbyopia: Secondary | ICD-10-CM | POA: Diagnosis not present

## 2021-08-22 ENCOUNTER — Inpatient Hospital Stay: Payer: PPO | Admitting: Adult Health

## 2021-09-04 ENCOUNTER — Inpatient Hospital Stay: Payer: PPO | Attending: Adult Health | Admitting: Adult Health

## 2021-09-04 ENCOUNTER — Other Ambulatory Visit: Payer: Self-pay

## 2021-09-04 ENCOUNTER — Other Ambulatory Visit (HOSPITAL_COMMUNITY): Payer: Self-pay

## 2021-09-04 VITALS — BP 143/56 | HR 83 | Temp 97.0°F | Resp 18 | Wt 211.8 lb

## 2021-09-04 DIAGNOSIS — Z9221 Personal history of antineoplastic chemotherapy: Secondary | ICD-10-CM | POA: Insufficient documentation

## 2021-09-04 DIAGNOSIS — Z170 Estrogen receptor positive status [ER+]: Secondary | ICD-10-CM

## 2021-09-04 DIAGNOSIS — Z853 Personal history of malignant neoplasm of breast: Secondary | ICD-10-CM | POA: Insufficient documentation

## 2021-09-04 DIAGNOSIS — C50411 Malignant neoplasm of upper-outer quadrant of right female breast: Secondary | ICD-10-CM | POA: Diagnosis not present

## 2021-09-04 DIAGNOSIS — I25118 Atherosclerotic heart disease of native coronary artery with other forms of angina pectoris: Secondary | ICD-10-CM | POA: Diagnosis not present

## 2021-09-04 DIAGNOSIS — Z6836 Body mass index (BMI) 36.0-36.9, adult: Secondary | ICD-10-CM

## 2021-09-04 DIAGNOSIS — Z79899 Other long term (current) drug therapy: Secondary | ICD-10-CM | POA: Diagnosis not present

## 2021-09-04 DIAGNOSIS — Z923 Personal history of irradiation: Secondary | ICD-10-CM | POA: Insufficient documentation

## 2021-09-04 DIAGNOSIS — M057A Rheumatoid arthritis with rheumatoid factor of other specified site without organ or systems involvement: Secondary | ICD-10-CM | POA: Insufficient documentation

## 2021-09-04 MED ORDER — ZOSTER VAC RECOMB ADJUVANTED 50 MCG/0.5ML IM SUSR
0.5000 mL | INTRAMUSCULAR | 1 refills | Status: DC
  Filled 2021-09-04: qty 0.5, 1d supply, fill #0
  Filled 2021-11-06: qty 0.5, 1d supply, fill #1

## 2021-09-04 NOTE — Progress Notes (Signed)
CLINIC:  Survivorship   REASON FOR VISIT:  Routine follow-up for history of breast cancer.   BRIEF ONCOLOGIC HISTORY:  Oncology History  Malignant neoplasm of upper-outer quadrant of female breast ([place])  12/02/2011 -  Neo-Adjuvant Chemotherapy   Taxotere and Cytoxan 4    04/20/2012 Initial Diagnosis   Right breast invasive ductal carcinoma with DCIS 2.5 cm with the MRI showing a maximum diameter of 7.5 cm ER/PR positive HER-2 negative Ki-67 15%    Surgery   Lumpectomy: IDC with lymphovascular invasion 1.9 cm, 2 sentinel lymph nodes negative    Radiation Therapy   Adjuvant radiation   07/21/2012 - 07/2019 Anti-estrogen oral therapy   Letrozole 2.5 mg daily       INTERVAL HISTORY:  Ms. Gandolfi presents to the [facility] today for routine follow-up for her history of breast cancer.  Overall, she reports feeling quite well.   Clarece's most recent mammogram was completed on 11/12/2020 and showed no evidence of malignancy and breast density category b.  Her most recent bone density test was completed in 06/2018 with Dr. Joylene Draft and was normal.  She has no osteopenia or osteoporosis.     She has noted some changes in her right breast.  She isn't sure if it is something to be concerned about or not.        REVIEW OF SYSTEMS:  Review of Systems  Constitutional:  Negative for appetite change, chills, fatigue, fever and unexpected weight change.  HENT:   Negative for hearing loss, lump/mass and sore throat.   Eyes:  Negative for eye problems and icterus.  Respiratory:  Negative for chest tightness, cough and shortness of breath.   Cardiovascular:  Negative for chest pain, leg swelling and palpitations.  Gastrointestinal:  Negative for abdominal distention, abdominal pain, constipation, diarrhea, nausea and vomiting.  Endocrine: Negative for hot flashes.  Genitourinary:  Negative for difficulty urinating.   Musculoskeletal:  Negative for arthralgias.  Skin:  Negative for  itching and rash.  Neurological:  Negative for dizziness, extremity weakness, headaches and numbness.  Hematological:  Negative for adenopathy. Does not bruise/bleed easily.  Psychiatric/Behavioral:  Negative for depression. The patient is not nervous/anxious.   Breast: Denies any new nodularity, masses, tenderness, nipple changes, or nipple discharge.       PAST MEDICAL/SURGICAL HISTORY:  Past Medical History:  Diagnosis Date   Anemia    history of   Arthritis    osteo - s/p right total knee   Asthma    daily and prn inhalers   Boil, axilla    Breast cancer ([place]) 10/28/11   right breast   CAD (coronary artery disease)    Colon polyp    GERD (gastroesophageal reflux disease)    Grave's disease    no current meds.   H pylori ulcer    Heart murmur    History of blood transfusion 2006   History of chemotherapy    neoadjuvant: 5 cycles of Taxol/carboplatinum:  last dose 02/24/12   History of hiatal hernia    Hyperlipidemia    Hypertension    under control with med.   Hypothyroid    Liver cyst    History of    Pre-diabetes    RA (rheumatoid arthritis) ([place] [facility])    S/P chemotherapy, time since 4-12 weeks finished 02/20/2011   S/P radiation therapy    Right Breast High Axilla and Supraclavicular gegion: 4500 cGy/25 Fractions with boost for a total of 6300 cGy   Sleep apnea  sleep study 09/18/2005   uses CPAP occ. - to bring machine DOS   Spondylosis    cervical and lumbar   Use of letrozole ([facility]em[facility]) start 07/21/12   Past Surgical History:  Procedure Laterality Date   ABDOMINAL HYSTERECTOMY     BILATERAL OOPHORECTOMY  2009   Boil removal under arm Right    BREAST BIOPSY Bilateral 1998   BREAST BIOPSY Right 08/11/2018   FIBROADENOMATOID NODULE WITH CALCIFICATIONS    BREAST LUMPECTOMY Right 03/2012   CARDIAC CATHETERIZATION  04/10/2010, 05/07/2004   LAD lesion, blood flow OK, per pt.   CARDIOVASCULAR STRESS TEST  02/15/2018   CARPAL TUNNEL RELEASE     left   CARPAL TUNNEL  RELEASE Right    CATARACT EXTRACTION, BILATERAL     COLONOSCOPY  12/20/2012   Procedure: COLONOSCOPY;  Surgeon: Lafayette Dragon, MD;  Location: [facility];  Service: Endoscopy;  Laterality: N/A;   ESOPHAGOGASTRODUODENOSCOPY  12/20/2012   Procedure: ESOPHAGOGASTRODUODENOSCOPY (EGD);  Surgeon: Lafayette Dragon, MD;  Location: [facility];  Service: Endoscopy;  Laterality: N/A;   LESION REMOVAL  11/27/2011   Procedure: LESION REMOVAL;  Surgeon: Adin Hector, MD;  Location: [facility];  Service: General;  Laterality: Right;  Skin tag removed from under right eye. No specimen   LUMBAR LAMINECTOMY     back surg. x 2 - 1980's and 1999   NM MYOVIEW LTD  08/02/2012   no ischemia   PARTIAL HYSTERECTOMY     BSO   PORTACATH PLACEMENT  11/27/2011   Procedure: INSERTION PORT-A-CATH;  Surgeon: Adin Hector, MD;  Location: [facility];  Service: General;  Laterality: Right;   TOTAL HIP ARTHROPLASTY Right 03/17/2018   Procedure: RIGHT TOTAL HIP ARTHROPLASTY ANTERIOR APPROACH;  Surgeon: Rod Can, MD;  Location: [facility];  Service: Orthopedics;  Laterality: Right;  Needs RNFA   TOTAL KNEE ARTHROPLASTY  07/28/2006   right   US ECHOCARDIOGRAPHY  08/14/2008   technically difficult-mild LVH, EF =>55%,mild annular ca+,AOV mildly sclerotic     ALLERGIES:  Allergies  Allergen Reactions   Crestor [Rosuvastatin] Other (See Comments)    crampin of legs and feet--pt is currently taking.   Lactose Intolerance (Gi) Other (See Comments)    ABD. CRAMPS   Lipitor [Atorvastatin Calcium] Other (See Comments)    LEG CRAMPS   Zocor [Simvastatin - High Dose] Other (See Comments)    Leg cramps     CURRENT MEDICATIONS:  Outpatient Encounter Medications as of 09/04/2021  Medication Sig   albuterol (ACCUNEB) 1.25 MG/3ML nebulizer solution Take 1 ampule by nebulization every 6 (six) hours as needed for wheezing.   aspirin 81 MG chewable tablet Chew 1 tablet (81 mg total) by mouth 2  (two) times daily.   Cholecalciferol (VITAMIN D-3) 5000 units TABS Take 5,000 Units by mouth daily.   COVID-19 mRNA Vac-TriS, Pfizer, SUSP injection Inject into the muscle.   diclofenac sodium (VOLTAREN) 1 % GEL Apply 2-4 g topically 4 (four) times daily as needed. For joint pain.   diltiazem (CARDIZEM CD) 240 MG 24 hr capsule TAKE ONE CAPSULE BY MOUTH ONCE DAILY   docusate sodium (COLACE) 100 MG capsule Take 1 capsule (100 mg total) by mouth 2 (two) times daily.   ezetimibe (ZETIA) 10 MG tablet Take 10 mg by mouth every evening.    Fluticasone-Salmeterol (ADVAIR) 100-50 MCG/DOSE AEPB Inhale 1 puff into the lungs daily.    folic acid (FOLVITE) 1 MG tablet Take 1 mg by  mouth daily.   gabapentin (NEURONTIN) 100 MG capsule Take 3 capsules (300 mg total) by mouth at bedtime.   levothyroxine (SYNTHROID, LEVOTHROID) 75 MCG tablet Take 75 mcg by mouth daily before breakfast.    Methotrexate (XATMEP PO) methotrexate   omeprazole (PRILOSEC) 20 MG capsule Take 20 mg by mouth daily.   ondansetron (ZOFRAN) 4 MG tablet Take 1 tablet (4 mg total) by mouth every 6 (six) hours as needed for nausea.   rosuvastatin (CRESTOR) 10 MG tablet Take 1 tablet (10 mg total) by mouth 3 (three) times a week. Mondays, Wednesdays & Fridays.   TIADYLT ER 360 MG 24 hr capsule Take 360 mg by mouth at bedtime.   Tiotropium Bromide Monohydrate (SPIRIVA RESPIMAT IN) Spiriva with HandiHaler   Tiotropium Bromide Monohydrate (SPIRIVA RESPIMAT) 1.25 MCG/ACT AERS Inhale 2.5 mcg into the lungs daily as needed (for respiratory issues.).   traMADol (ULTRAM) 50 MG tablet Take by mouth every 6 (six) hours as needed.   triamterene-hydrochlorothiazide (MAXZIDE-25) 37.5-25 MG tablet TAKE ONE TABLET BY MOUTH ONCE DAILY   No facility-administered encounter medications on file as of 09/04/2021.     ONCOLOGIC FAMILY HISTORY:  Family History  Problem Relation Age of Onset   Cancer Maternal Grandfather        unaware   Anesthesia problems  Cousin        pt. states she is not aware of any family anes. prob., does not know where this comment came from   Cancer Cousin        breast, brain mets   Cancer Cousin        ovarian, age 36-50   Cancer Cousin        pancreatic   Colon cancer Neg Hx    Esophageal cancer Neg Hx    Rectal cancer Neg Hx    Stomach cancer Neg Hx     GENETIC COUNSELING/TESTING: See above  SOCIAL HISTORY:  Social History   Socioeconomic History   Marital status: Single    Spouse name: Not on file   Number of children: Not on file   Years of education: Not on file   Highest education level: Not on file  Occupational History   Occupation: retired    Fish farm manager: RETIRED  Tobacco Use   Smoking status: Never   Smokeless tobacco: Never  Vaping Use   Vaping Use: Never used  Substance and Sexual Activity   Alcohol use: No    Alcohol/week: 0.0 standard drinks   Drug use: No   Sexual activity: Not Currently    Birth control/protection: Surgical  Other Topics Concern   Not on file  Social History Narrative   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  Unaccompanied today.   HEENT: Head is normocephalic.  Pupils equal and reactive to light.  Conjunctivae clear without exudate.  Sclerae anicteric. Oral mucosa is pink, moist.  Oropharynx is pink without lesions or erythema.  Lymph: No cervical, supraclavicular, or  infraclavicular lymphadenopathy noted on palpation.  Cardiovascular: Regular rate and rhythm.Marland Kitchen Respiratory: Clear to auscultation bilaterally. Chest expansion symmetric; breathing non-labored.  Breast Exam:  -Left breast: No appreciable masses on palpation. No skin redness, thickening, or peau d'orange appearance; no nipple retraction or nipple discharge;   -Right breast: No appreciable masses on palpation. No skin redness, thickening, or peau d'orange appearance; no nipple retraction or nipple discharge; mild distortion in symmetry at previous lumpectomy site well healed scar without erythema or nodularity. -Axilla: No axillary adenopathy bilaterally.  GI: Abdomen soft and round; non-tender, non-distended. Bowel sounds normoactive. No hepatosplenomegaly.   GU: Deferred.  Neuro: No focal deficits. Steady gait.  Psych: Mood and affect normal and appropriate for situation.  MSK: No focal spinal tenderness to palpation, full range of motion in bilateral upper extremities Extremities: No edema. Skin: Warm and dry.  LABORATORY DATA:  None for this visit   DIAGNOSTIC IMAGING:  Most recent mammogram:  CLINICAL DATA:  Screening.   EXAM: DIGITAL SCREENING BILATERAL MAMMOGRAM WITH TOMO AND CAD   COMPARISON:  Previous exam(s).   ACR Breast Density Category b: There are scattered areas of fibroglandular density.   FINDINGS: There are no findings suspicious for malignancy. Images were processed with CAD.   IMPRESSION: No mammographic evidence of malignancy. A result letter of this screening mammogram will be mailed directly to the patient.   RECOMMENDATION: Screening mammogram in one year. (Code:SM-B-01Y)   BI-RADS CATEGORY  1: Negative.     Electronically Signed   By: Dorise Bullion III M.D   On: 11/15/2020 09:56   ASSESSMENT AND PLAN:  Ms.. Fiorenza is a pleasant 80 y.o. female with history of Stage IIB right breast invasive ductal carcinoma, ER+/PR+/HER2-, diagnosed in 11/2011,  treated with neoadjuvant chemotherapy, lumpectomy, adjuvant radiation therapy, and anti-estrogen therapy with Letrozole x 7 years completing therapy in 07/2019.  [facility] and routine follow-up.   1. History of breast cancer:  We are going to evaluate the changes she has been experiencing in her right breast.  She is due for bilateral mammogram in November.  Assuming there are no surprises, we will see her back in 1 year for LTS f/u.  I encouraged her to call me with any questions or concerns before her next visit at the cancer center, and I would be happy to see her sooner, if needed.    2. Bone health:    She was given education on specific food and activities to promote bone health.  3. Cancer screening:  Due to Ms. Hursey's history and her age, she should receive screening for skin cancers, colon cancers. She was encouraged to follow-up with her PCP for appropriate cancer screenings.   4. Health maintenance and wellness promotion: Ms. Delao was encouraged to consume 5-7 servings of fruits and vegetables per day. She was also encouraged to engage in moderate to vigorous exercise for 30 minutes per day most days of the week. She was instructed to limit her alcohol consumption and continue to abstain from tobacco use.    Dispo:  -Return to cancer center in one year for LTS follow up    Total encounter time: 20 minutes*  Wilber Bihari, NP 09/04/21 2:20 PM Medical Oncology and Hematology [facility] [place], [place] [address] Tel. [phone]  Fax. [phone]  *Total Encounter Time as defined by the Centers for Medicare and Medicaid Services includes, in addition to the face-to-face time of a patient visit (documented in the note above) non-face-to-face time: obtaining and reviewing outside history, ordering and reviewing medications, tests or procedures, care coordination (communications with other health care  professionals or caregivers) and documentation in the medical record.    Note: PRIMARY CARE PROVIDER [name], [place] [phone]

## 2021-09-05 ENCOUNTER — Other Ambulatory Visit (HOSPITAL_COMMUNITY): Payer: Self-pay

## 2021-09-06 ENCOUNTER — Encounter: Payer: Self-pay | Admitting: Adult Health

## 2021-09-09 ENCOUNTER — Encounter: Payer: Self-pay | Admitting: Cardiovascular Disease

## 2021-09-09 ENCOUNTER — Ambulatory Visit: Payer: PPO | Admitting: Cardiovascular Disease

## 2021-09-09 ENCOUNTER — Other Ambulatory Visit: Payer: Self-pay

## 2021-09-09 VITALS — BP 138/58 | HR 89 | Ht 64.5 in | Wt 219.0 lb

## 2021-09-09 DIAGNOSIS — Z6835 Body mass index (BMI) 35.0-35.9, adult: Secondary | ICD-10-CM

## 2021-09-09 DIAGNOSIS — I2510 Atherosclerotic heart disease of native coronary artery without angina pectoris: Secondary | ICD-10-CM | POA: Diagnosis not present

## 2021-09-09 DIAGNOSIS — R0602 Shortness of breath: Secondary | ICD-10-CM | POA: Diagnosis not present

## 2021-09-09 DIAGNOSIS — I10 Essential (primary) hypertension: Secondary | ICD-10-CM

## 2021-09-09 DIAGNOSIS — E7800 Pure hypercholesterolemia, unspecified: Secondary | ICD-10-CM | POA: Diagnosis not present

## 2021-09-09 NOTE — Patient Instructions (Addendum)
Medication Instructions:  No changes *If you need a refill on your cardiac medications before your next appointment, please call your pharmacy*   Lab Work: None ordered If you have labs (blood work) drawn today and your tests are completely normal, you will receive your results only by: [place] (if you have MyChart) OR A paper copy in the mail If you have any lab test that is abnormal or we need to change your treatment, we will call you to review the results.   Testing/Procedures: Your physician has requested that you have an echocardiogram. Echocardiography is a painless test that uses sound waves to create images of your heart. It provides your doctor with information about the size and shape of your heart and how well your heart's chambers and valves are working. You may receive an ultrasound enhancing agent through an IV if needed to better visualize your heart during the echo.This procedure takes approximately one hour. There are no restrictions for this procedure. This will take place at the [address].    Follow-Up: At [facility], you and your health needs are our priority.  As part of our continuing mission to provide you with exceptional heart care, we have created designated Provider Care Teams.  These Care Teams include your primary Cardiologist (physician) and Advanced Practice Providers (APPs -  Physician Assistants and Nurse Practitioners) who all work together to provide you with the care you need, when you need it.  We recommend signing up for the patient portal called "MyChart".  Sign up information is provided on this After Visit Summary.  MyChart is used to connect with patients for Virtual Visits (Telemedicine).  Patients are able to view lab/test results, encounter notes, upcoming appointments, etc.  Non-urgent messages can be sent to your provider as well.   To learn more about what you can do with MyChart, go to [email or web address].     Your next appointment:   12 month(s)  The format for your next appointment:   In Person  Provider:   You may see Sanda Klein, MD or one of the following Advanced Practice Providers on your designated Care Team:   Almyra Deforest, PA-C Fabian Sharp, PA-C or  Roby Lofts, [place]

## 2021-09-09 NOTE — Progress Notes (Signed)
Patient ID: [name], female   DOB: 1940/12/27, 80 y.o.   MRN: [id number]    Cardiology Office Note    Date:  09/10/2021   ID:  Gutierrez, Jasmine 04-19-1941, MRN [id number]  PCP:  [name], MD  Cardiologist:    Sanda Klein, MD   Chief Complaint  Patient presents with   Shortness of Breath    History of Present Illness:  [name] is a 80 y.o. female with a history of moderate coronary artery disease, hypertension, hyperlipidemia, severe obesity, reactive airway disease, right total hip replacement and treated hypothyroidism.  She presents with complaints of "indigestion" and an aching discomfort in her left arm.  She had low risk nuclear stress test in 2016 2019 and 2021, although the last study did show a small and mild reversible apical septal defect.  She has noticed worsening exercise tolerance for the last few months.  Symptoms began when the weather changed in the spring.  Shortness of breath is present sometimes at rest, but is consistently present if she walks more than about 300 feet on level ground or if she climbs a flight of stairs.  The shortness of breath improves after using her inhalers.  She does not hear wheezing with exercise, but does sometimes hear wheezing at night.  She chronically sleeps sitting fairly upright due to GERD.  She has not noticed PND.  She has chronic 2+ edema of the lower extremities and takes a calcium channel blocker.  She has not had recent complaints of chest discomfort or "indigestion" which she has described in the past.  She denies palpitations, dizziness, syncope.  She has not had any falls.  She is on  Zetia daily and only taking Crestor 10 mg half tablet 3 days a week.  She has not tolerated daily statins due to muscle cramps.  She takes self-administered weekly methotrexate injections for rheumatoid arthritis.  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  She had an intermediate  severity stenosis of the mid LAD artery. This was demonstrated by coronary angiography in 2011, but shown not to be hemodynamically important by pressure wire analysis. She had some problems with intrascapular pressure in 2013 and in 2019 had nuclear stress tests that showed normal myocardial perfusion and normal left ventricular systolic function.  The nuclear stress test in 2021 showed a questionable small mild defect in the apical septal distribution, but the study was hard to interpret due to severe extracardiac tracer uptake.  Wall motion and EF were normal.  Past Medical History:  Diagnosis Date   Anemia    history of   Arthritis    osteo - s/p right total knee   Asthma    daily and prn inhalers   Boil, axilla    Breast cancer ([place]) 10/28/11   right breast   CAD (coronary artery disease)    Colon polyp    GERD (gastroesophageal reflux disease)    Grave's disease    no current meds.   H pylori ulcer    Heart murmur    History of blood transfusion 2006   History of chemotherapy    neoadjuvant: 5 cycles of Taxol/carboplatinum:  last dose 02/24/12   History of hiatal hernia    Hyperlipidemia    Hypertension    under control with med.   Hypothyroid    Liver cyst    History of    Pre-diabetes    RA (rheumatoid  arthritis) ([place])    S/P chemotherapy, time since 4-12 weeks finished 02/20/2011   S/P radiation therapy    Right Breast High Axilla and Supraclavicular gegion: 4500 cGy/25 Fractions with boost for a total of 6300 cGy   Sleep apnea sleep study 09/18/2005   uses CPAP occ. - to bring machine DOS   Spondylosis    cervical and lumbar   Use of letrozole ([place]) start 07/21/12    Past Surgical History:  Procedure Laterality Date   ABDOMINAL HYSTERECTOMY     BILATERAL OOPHORECTOMY  2009   Boil removal under arm Right    BREAST BIOPSY Bilateral 1998   BREAST BIOPSY Right 08/11/2018   FIBROADENOMATOID NODULE WITH CALCIFICATIONS    BREAST LUMPECTOMY Right 03/2012   CARDIAC  CATHETERIZATION  04/10/2010, 05/07/2004   LAD lesion, blood flow OK, per pt.   CARDIOVASCULAR STRESS TEST  02/15/2018   CARPAL TUNNEL RELEASE     left   CARPAL TUNNEL RELEASE Right    CATARACT EXTRACTION, BILATERAL     COLONOSCOPY  12/20/2012   Procedure: COLONOSCOPY;  Surgeon: Lafayette Dragon, MD;  Location: [facility];  Service: Endoscopy;  Laterality: N/A;   ESOPHAGOGASTRODUODENOSCOPY  12/20/2012   Procedure: ESOPHAGOGASTRODUODENOSCOPY (EGD);  Surgeon: Lafayette Dragon, MD;  Location: [facility];  Service: Endoscopy;  Laterality: N/A;   LESION REMOVAL  11/27/2011   Procedure: LESION REMOVAL;  Surgeon: Adin Hector, MD;  Location: [facility];  Service: General;  Laterality: Right;  Skin tag removed from under right eye. No specimen   LUMBAR LAMINECTOMY     back surg. x 2 - 1980's and 1999   NM MYOVIEW LTD  08/02/2012   no ischemia   PARTIAL HYSTERECTOMY     BSO   PORTACATH PLACEMENT  11/27/2011   Procedure: INSERTION PORT-A-CATH;  Surgeon: Adin Hector, MD;  Location: [facility];  Service: General;  Laterality: Right;   TOTAL HIP ARTHROPLASTY Right 03/17/2018   Procedure: RIGHT TOTAL HIP ARTHROPLASTY ANTERIOR APPROACH;  Surgeon: Rod Can, MD;  Location: [facility];  Service: Orthopedics;  Laterality: Right;  Needs RNFA   TOTAL KNEE ARTHROPLASTY  07/28/2006   right   US ECHOCARDIOGRAPHY  08/14/2008   technically difficult-mild LVH, EF =>55%,mild annular ca+,AOV mildly sclerotic    Current Medications: Outpatient Medications Prior to Visit  Medication Sig Dispense Refill   aspirin 81 MG EC tablet Take 81 mg by mouth daily.     Cholecalciferol (VITAMIN D-3) 5000 units TABS Take 5,000 Units by mouth daily.     Cyanocobalamin (B-12) 1000 MCG TABS Take 1,000 mcg by mouth daily.     diclofenac sodium (VOLTAREN) 1 % GEL Apply 2-4 g topically 4 (four) times daily as needed. For joint pain.  3   diltiazem (CARDIZEM CD) 240 MG 24 hr capsule TAKE ONE CAPSULE  BY MOUTH ONCE DAILY 90 capsule 0   ezetimibe (ZETIA) 10 MG tablet Take 10 mg by mouth every evening.      Fluticasone-Salmeterol (ADVAIR) 100-50 MCG/DOSE AEPB Inhale 1 puff into the lungs daily.      folic acid (FOLVITE) 1 MG tablet Take 1 mg by mouth daily.     gabapentin (NEURONTIN) 100 MG capsule Take 3 capsules (300 mg total) by mouth at bedtime.     ipratropium (ATROVENT HFA) 17 MCG/ACT inhaler Inhale 2 puffs into the lungs every 6 (six) hours.     levothyroxine (SYNTHROID, LEVOTHROID) 75 MCG tablet Take 75 mcg by mouth daily  before breakfast.   2   Methotrexate (XATMEP PO) Take 0.6 mg by mouth once a week.     omeprazole (PRILOSEC) 20 MG capsule Take 20 mg by mouth daily.     rosuvastatin (CRESTOR) 10 MG tablet Take 1 tablet (10 mg total) by mouth 3 (three) times a week. Mondays, Wednesdays & Fridays. 40 tablet 0   Tiotropium Bromide Monohydrate (SPIRIVA RESPIMAT IN) Spiriva with HandiHaler     traMADol (ULTRAM) 50 MG tablet Take by mouth every 6 (six) hours as needed.     triamterene-hydrochlorothiazide (MAXZIDE-25) 37.5-25 MG tablet TAKE ONE TABLET BY MOUTH ONCE DAILY 30 tablet 5   docusate sodium (COLACE) 100 MG capsule Take 1 capsule (100 mg total) by mouth 2 (two) times daily. (Patient not taking: Reported on 09/09/2021) 60 capsule 1   HYDROcodone bit-homatropine (HYCODAN) 5-1.5 MG/5ML syrup Take 5 mLs by mouth every 6 (six) hours as needed. (Patient not taking: Reported on 09/09/2021)     ondansetron (ZOFRAN) 4 MG tablet Take 1 tablet (4 mg total) by mouth every 6 (six) hours as needed for nausea. (Patient not taking: Reported on 09/09/2021) 20 tablet 0   TIADYLT ER 360 MG 24 hr capsule Take 360 mg by mouth at bedtime. (Patient not taking: Reported on 09/09/2021)     Tiotropium Bromide Monohydrate 1.25 MCG/ACT AERS Inhale 2.5 mcg into the lungs daily as needed (for respiratory issues.). (Patient not taking: Reported on 09/09/2021)     Zoster Vaccine Adjuvanted [facility]) injection Inject 0.5  mLs into the muscle. 0.5 mL 1   No facility-administered medications prior to visit.     Allergies:   Crestor [rosuvastatin], Lactose intolerance (gi), Lipitor [atorvastatin calcium], and Zocor [simvastatin - high dose]   Social History   Socioeconomic History   Marital status: Single    Spouse name: Not on file   Number of children: Not on file   Years of education: Not on file   Highest education level: Not on file  Occupational History   Occupation: retired    Fish farm manager: RETIRED  Tobacco Use   Smoking status: Never   Smokeless tobacco: Never  Vaping Use   Vaping Use: Never used  Substance and Sexual Activity   Alcohol use: No    Alcohol/week: 0.0 standard drinks   Drug use: No   Sexual activity: Not Currently    Birth control/protection: Surgical  Other Topics Concern   Not on file  Social History Narrative   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   ROS:   Please see the history of present illness.    ROS All other systems are reviewed and are negative.   PHYSICAL EXAM:   VS:  BP (!) 138/58 (BP Location: Left Arm, Patient Position: Sitting, Cuff Size: Large)   Pulse 89   Ht 5' 4.5" (1.638 m)   Wt 219 lb (99.3 kg)   SpO2 97%   BMI 37.01 kg/m      General: Alert, oriented x3, no distress, severely obese Head: no evidence of trauma, PERRL,  EOMI, no exophtalmos or lid lag, no myxedema, no xanthelasma; normal ears, nose and oropharynx Neck: normal jugular venous pulsations and no hepatojugular reflux; brisk carotid pulses without delay and no  carotid bruits Chest: clear to auscultation, no signs of consolidation by percussion or palpation, normal fremitus, symmetrical and full respiratory excursions Cardiovascular: normal position and quality of the apical impulse, regular rhythm, normal first and second heart sounds, no murmurs, rubs or gallops Abdomen: no tenderness or distention, no masses by palpation, no abnormal pulsatility or arterial bruits, normal bowel sounds, no hepatosplenomegaly Extremities: no clubbing, cyanosis, but she has symmetrical 2+ ankle pitting edema; 2+ radial, ulnar and brachial pulses bilaterally; 2+ right femoral, posterior tibial and dorsalis pedis pulses; 2+ left femoral, posterior tibial and dorsalis pedis pulses; no subclavian or femoral bruits Neurological: grossly nonfocal Psych: Normal mood and affect    Wt Readings from Last 3 Encounters:  09/09/21 219 lb (99.3 kg)  09/04/21 211 lb 12.8 oz (96.1 kg)  08/05/20 227 lb 12.8 oz (103.3 kg)      Studies/Labs Reviewed:   EKG:  EKG is ordered today.  Shows normal sinus rhythm, mild ST-T changes in the lateral leads, QTC 396 ms.  The repolarization changes are similar to the previous tracing.  Recent Labs: 12/20/2019 Hemoglobin A1c 5.8%, TSH 0.28 12/01/2019 Creatinine 0.85, normal liver function tests   01/02/2021 Creatinine 1.13 02/28/2021 Potassium 3.9, normal liver function tests, TSH 2.25, hemoglobin A1c 5.9%  BMET    Component Value Date/Time   NA 140 03/18/2018 0457   NA 142 12/17/2014 1039   K 3.6 03/18/2018 0457   K 3.5 12/17/2014 1039   CL 103 03/18/2018 0457   CL 101 04/24/2013 0904   CO2 28 03/18/2018 0457   CO2 31 (H) 12/17/2014 1039   GLUCOSE 150 (H) 03/18/2018 0457   GLUCOSE 108 12/17/2014 1039   GLUCOSE 116 (H) 04/24/2013 0904   BUN 20 03/18/2018 0457   BUN 18.3 12/17/2014 1039   CREATININE 0.84 03/18/2018 0457   CREATININE 0.98 02/22/2015 0935   CREATININE 0.9 12/17/2014 1039   CALCIUM 9.1 03/18/2018 0457    CALCIUM 10.2 12/17/2014 1039   GFRNONAA >60 03/18/2018 0457   GFRAA >60 03/18/2018 0457   07/20/2019 Total cholesterol 174, HDL 59, LDL 93, triglycerides 112 Hemoglobin 12.8 08/23/2020 Cholesterol 175, HDL 66, LDL 90, triglycerides 97 ASSESSMENT:    1. Shortness of breath   2. Coronary artery disease involving native coronary artery of native heart without angina pectoris   3. Hypercholesterolemia   4. Essential hypertension   5. Severe obesity (BMI 35.0-35.9 with comorbidity) ([place])      PLAN:  In order of problems listed above:  1.  Exertional dyspnea: Association with changes in weather, improvement with bronchodilators and presence of wheezing suggest that this may be due to reactive airway disease, but she does have edema and may have elevated jugular veins (hard to see).  She has multiple risk factors for diastolic heart failure.  Conversely, edema may be related to diltiazem.    We will recheck an echocardiogram and BNP.   2. CAD: Currently without angina.  Equivocal results on most recent nuclear stress test last year.  If the echocardiogram shows regional wall motion abnormalities or frank reduction in EF would recommend proceeding directly to coronary angiography, but if the EF remains normal and regional wall motion is normal, consider CT angiography.   3. HLP: LDL not at target.  Has had statin myopathy at higher doses.  Already on ezetimibe as well.  She is due for repeat labs to be performed next month with Dr. Haynes Kerns.  If her LDL is still greater than 70, recommend switching to PCSK9 inhibitor. 4. HTN: Adequate control.Note that she has 2 different doses for diltiazem due to different prescriptions from her primary care provider office and from our office.  It appears that the correct dose should be 360 mg based on the most recent adjustment for her blood pressure. 5. Obesity: Weight loss would be highly beneficial for her dyspnea, regardless of cause as well as to reduce the  occurrence of edema and improve her metabolic profile.   Medication Adjustments/Labs and Tests Ordered: Current medicines are reviewed at length with the patient today.  Concerns regarding medicines are outlined above.  Medication changes, Labs and Tests ordered today are listed in the Patient Instructions below. Patient Instructions  Medication Instructions:  No changes *If you need a refill on your cardiac medications before your next appointment, please call your pharmacy*   Lab Work: None ordered If you have labs (blood work) drawn today and your tests are completely normal, you will receive your results only by: [place] (if you have MyChart) OR A paper copy in the mail If you have any lab test that is abnormal or we need to change your treatment, we will call you to review the results.   Testing/Procedures: Your physician has requested that you have an echocardiogram. Echocardiography is a painless test that uses sound waves to create images of your heart. It provides your doctor with information about the size and shape of your heart and how well your heart's chambers and valves are working. You may receive an ultrasound enhancing agent through an IV if needed to better visualize your heart during the echo.This procedure takes approximately one hour. There are no restrictions for this procedure. This will take place at the [address], [address].    Follow-Up: At [facility], you and your health needs are our priority.  As part of our continuing mission to provide you with exceptional heart care, we have created designated Provider Care Teams.  These Care Teams include your primary Cardiologist (physician) and Advanced Practice Providers (APPs -  Physician Assistants and Nurse Practitioners) who all work together to provide you with the care you need, when you need it.  We recommend signing up for the patient portal called "MyChart".  Sign up information is provided  on this After Visit Summary.  MyChart is used to connect with patients for Virtual Visits (Telemedicine).  Patients are able to view lab/test results, encounter notes, upcoming appointments, etc.  Non-urgent messages can be sent to your provider as well.   To learn more about what you can do with MyChart, go to [email or web address].    Your next appointment:   12 month(s)  The format for your next appointment:   In Person  Provider:   You may see Sanda Klein, MD or one of the following Advanced Practice Providers on your designated Care Team:   Almyra Deforest, PA-C Fabian Sharp, [place] or  Roby Lofts, PA-C    Signed, Sanda Klein, MD  09/10/2021 9:17 AM    [address], [place], [place]  [address] Phone: [phone]; Fax: [phone]

## 2021-09-15 ENCOUNTER — Other Ambulatory Visit: Payer: Self-pay

## 2021-09-15 ENCOUNTER — Ambulatory Visit (HOSPITAL_COMMUNITY): Payer: PPO | Attending: Cardiology

## 2021-09-15 DIAGNOSIS — R0602 Shortness of breath: Secondary | ICD-10-CM | POA: Diagnosis not present

## 2021-09-15 LAB — ECHOCARDIOGRAM COMPLETE
Area-P 1/2: 6.35 cm2
S' Lateral: 2.9 cm

## 2021-09-17 ENCOUNTER — Other Ambulatory Visit: Payer: Self-pay

## 2021-09-17 ENCOUNTER — Ambulatory Visit
Admission: RE | Admit: 2021-09-17 | Discharge: 2021-09-17 | Disposition: A | Discharge status: Home / Self Care | Payer: PPO | Source: Ambulatory Visit | Attending: Adult Health | Admitting: Adult Health

## 2021-09-17 DIAGNOSIS — Z170 Estrogen receptor positive status [ER+]: Secondary | ICD-10-CM

## 2021-09-17 DIAGNOSIS — R922 Inconclusive mammogram: Secondary | ICD-10-CM | POA: Diagnosis not present

## 2021-09-17 DIAGNOSIS — C50411 Malignant neoplasm of upper-outer quadrant of right female breast: Secondary | ICD-10-CM

## 2021-09-19 DIAGNOSIS — I10 Essential (primary) hypertension: Secondary | ICD-10-CM | POA: Diagnosis not present

## 2021-09-24 DIAGNOSIS — M542 Cervicalgia: Secondary | ICD-10-CM | POA: Diagnosis not present

## 2021-09-24 DIAGNOSIS — I10 Essential (primary) hypertension: Secondary | ICD-10-CM | POA: Diagnosis not present

## 2021-09-24 DIAGNOSIS — I2510 Atherosclerotic heart disease of native coronary artery without angina pectoris: Secondary | ICD-10-CM | POA: Diagnosis not present

## 2021-09-24 DIAGNOSIS — E1169 Type 2 diabetes mellitus with other specified complication: Secondary | ICD-10-CM | POA: Diagnosis not present

## 2021-09-24 DIAGNOSIS — M069 Rheumatoid arthritis, unspecified: Secondary | ICD-10-CM | POA: Diagnosis not present

## 2021-09-25 ENCOUNTER — Ambulatory Visit
Admission: RE | Admit: 2021-09-25 | Discharge: 2021-09-25 | Disposition: A | Discharge status: Home / Self Care | Payer: PPO | Source: Ambulatory Visit | Attending: Adult Health | Admitting: Adult Health

## 2021-09-25 ENCOUNTER — Other Ambulatory Visit: Payer: Self-pay

## 2021-09-25 ENCOUNTER — Other Ambulatory Visit: Payer: Self-pay | Admitting: Adult Health

## 2021-09-25 DIAGNOSIS — M542 Cervicalgia: Secondary | ICD-10-CM | POA: Diagnosis not present

## 2021-09-25 DIAGNOSIS — M4802 Spinal stenosis, cervical region: Secondary | ICD-10-CM | POA: Diagnosis not present

## 2021-09-30 ENCOUNTER — Other Ambulatory Visit: Payer: Self-pay | Admitting: Cardiovascular Disease

## 2021-10-01 DIAGNOSIS — E559 Vitamin D deficiency, unspecified: Secondary | ICD-10-CM | POA: Diagnosis not present

## 2021-10-01 DIAGNOSIS — I10 Essential (primary) hypertension: Secondary | ICD-10-CM | POA: Diagnosis not present

## 2021-10-01 DIAGNOSIS — E039 Hypothyroidism, unspecified: Secondary | ICD-10-CM | POA: Diagnosis not present

## 2021-10-01 DIAGNOSIS — E785 Hyperlipidemia, unspecified: Secondary | ICD-10-CM | POA: Diagnosis not present

## 2021-10-01 DIAGNOSIS — E1169 Type 2 diabetes mellitus with other specified complication: Secondary | ICD-10-CM | POA: Diagnosis not present

## 2021-10-08 ENCOUNTER — Other Ambulatory Visit (HOSPITAL_COMMUNITY): Payer: Self-pay | Admitting: Internal Medicine

## 2021-10-08 ENCOUNTER — Other Ambulatory Visit: Payer: Self-pay

## 2021-10-08 ENCOUNTER — Ambulatory Visit (HOSPITAL_COMMUNITY)
Admission: RE | Admit: 2021-10-08 | Discharge: 2021-10-08 | Disposition: A | Discharge status: Home / Self Care | Payer: PPO | Source: Ambulatory Visit | Attending: Internal Medicine | Admitting: Internal Medicine

## 2021-10-08 DIAGNOSIS — Z23 Encounter for immunization: Secondary | ICD-10-CM | POA: Diagnosis not present

## 2021-10-08 DIAGNOSIS — E785 Hyperlipidemia, unspecified: Secondary | ICD-10-CM | POA: Diagnosis not present

## 2021-10-08 DIAGNOSIS — M069 Rheumatoid arthritis, unspecified: Secondary | ICD-10-CM | POA: Diagnosis not present

## 2021-10-08 DIAGNOSIS — M1990 Unspecified osteoarthritis, unspecified site: Secondary | ICD-10-CM | POA: Diagnosis not present

## 2021-10-08 DIAGNOSIS — E669 Obesity, unspecified: Secondary | ICD-10-CM | POA: Diagnosis not present

## 2021-10-08 DIAGNOSIS — E039 Hypothyroidism, unspecified: Secondary | ICD-10-CM | POA: Diagnosis not present

## 2021-10-08 DIAGNOSIS — M79605 Pain in left leg: Secondary | ICD-10-CM | POA: Diagnosis not present

## 2021-10-08 DIAGNOSIS — I2510 Atherosclerotic heart disease of native coronary artery without angina pectoris: Secondary | ICD-10-CM | POA: Diagnosis not present

## 2021-10-08 DIAGNOSIS — Z0000 Encounter for general adult medical examination without abnormal findings: Secondary | ICD-10-CM | POA: Diagnosis not present

## 2021-10-08 DIAGNOSIS — Z1331 Encounter for screening for depression: Secondary | ICD-10-CM | POA: Diagnosis not present

## 2021-10-08 DIAGNOSIS — Z1339 Encounter for screening examination for other mental health and behavioral disorders: Secondary | ICD-10-CM | POA: Diagnosis not present

## 2021-10-08 DIAGNOSIS — J45909 Unspecified asthma, uncomplicated: Secondary | ICD-10-CM | POA: Diagnosis not present

## 2021-10-08 DIAGNOSIS — G4733 Obstructive sleep apnea (adult) (pediatric): Secondary | ICD-10-CM | POA: Diagnosis not present

## 2021-10-08 DIAGNOSIS — M542 Cervicalgia: Secondary | ICD-10-CM | POA: Diagnosis not present

## 2021-10-08 DIAGNOSIS — I10 Essential (primary) hypertension: Secondary | ICD-10-CM | POA: Diagnosis not present

## 2021-10-17 ENCOUNTER — Other Ambulatory Visit: Payer: Self-pay | Admitting: Internal Medicine

## 2021-10-17 DIAGNOSIS — Z1231 Encounter for screening mammogram for malignant neoplasm of breast: Secondary | ICD-10-CM

## 2021-10-20 DIAGNOSIS — E039 Hypothyroidism, unspecified: Secondary | ICD-10-CM | POA: Diagnosis not present

## 2021-10-20 DIAGNOSIS — I10 Essential (primary) hypertension: Secondary | ICD-10-CM | POA: Diagnosis not present

## 2021-10-20 DIAGNOSIS — E1169 Type 2 diabetes mellitus with other specified complication: Secondary | ICD-10-CM | POA: Diagnosis not present

## 2021-10-20 DIAGNOSIS — K219 Gastro-esophageal reflux disease without esophagitis: Secondary | ICD-10-CM | POA: Diagnosis not present

## 2021-10-27 ENCOUNTER — Other Ambulatory Visit (HOSPITAL_COMMUNITY): Payer: Self-pay

## 2021-10-28 ENCOUNTER — Encounter: Payer: Self-pay | Admitting: Gastroenterology

## 2021-10-29 DIAGNOSIS — Z79899 Other long term (current) drug therapy: Secondary | ICD-10-CM | POA: Diagnosis not present

## 2021-10-29 DIAGNOSIS — M25511 Pain in right shoulder: Secondary | ICD-10-CM | POA: Diagnosis not present

## 2021-10-29 DIAGNOSIS — M0609 Rheumatoid arthritis without rheumatoid factor, multiple sites: Secondary | ICD-10-CM | POA: Diagnosis not present

## 2021-10-29 DIAGNOSIS — E785 Hyperlipidemia, unspecified: Secondary | ICD-10-CM | POA: Diagnosis not present

## 2021-10-29 DIAGNOSIS — I10 Essential (primary) hypertension: Secondary | ICD-10-CM | POA: Diagnosis not present

## 2021-10-29 DIAGNOSIS — M549 Dorsalgia, unspecified: Secondary | ICD-10-CM | POA: Diagnosis not present

## 2021-10-29 DIAGNOSIS — J449 Chronic obstructive pulmonary disease, unspecified: Secondary | ICD-10-CM | POA: Diagnosis not present

## 2021-11-06 ENCOUNTER — Other Ambulatory Visit (HOSPITAL_COMMUNITY): Payer: Self-pay

## 2021-11-19 DIAGNOSIS — E1169 Type 2 diabetes mellitus with other specified complication: Secondary | ICD-10-CM | POA: Diagnosis not present

## 2021-11-19 DIAGNOSIS — K219 Gastro-esophageal reflux disease without esophagitis: Secondary | ICD-10-CM | POA: Diagnosis not present

## 2021-11-19 DIAGNOSIS — E039 Hypothyroidism, unspecified: Secondary | ICD-10-CM | POA: Diagnosis not present

## 2021-11-19 DIAGNOSIS — I10 Essential (primary) hypertension: Secondary | ICD-10-CM | POA: Diagnosis not present

## 2021-11-20 ENCOUNTER — Other Ambulatory Visit: Payer: Self-pay

## 2021-11-20 ENCOUNTER — Ambulatory Visit
Admission: RE | Admit: 2021-11-20 | Discharge: 2021-11-20 | Disposition: A | Discharge status: Home / Self Care | Payer: PPO | Source: Ambulatory Visit | Attending: Internal Medicine | Admitting: Internal Medicine

## 2021-11-20 DIAGNOSIS — Z1231 Encounter for screening mammogram for malignant neoplasm of breast: Secondary | ICD-10-CM

## 2021-12-01 DIAGNOSIS — I10 Essential (primary) hypertension: Secondary | ICD-10-CM | POA: Diagnosis not present

## 2021-12-01 DIAGNOSIS — M0609 Rheumatoid arthritis without rheumatoid factor, multiple sites: Secondary | ICD-10-CM | POA: Diagnosis not present

## 2021-12-01 DIAGNOSIS — J449 Chronic obstructive pulmonary disease, unspecified: Secondary | ICD-10-CM | POA: Diagnosis not present

## 2021-12-01 DIAGNOSIS — E785 Hyperlipidemia, unspecified: Secondary | ICD-10-CM | POA: Diagnosis not present

## 2021-12-01 DIAGNOSIS — M549 Dorsalgia, unspecified: Secondary | ICD-10-CM | POA: Diagnosis not present

## 2021-12-01 DIAGNOSIS — Z79899 Other long term (current) drug therapy: Secondary | ICD-10-CM | POA: Diagnosis not present

## 2021-12-01 DIAGNOSIS — M25512 Pain in left shoulder: Secondary | ICD-10-CM | POA: Diagnosis not present

## 2021-12-19 ENCOUNTER — Other Ambulatory Visit: Payer: Self-pay | Admitting: Cardiovascular Disease

## 2021-12-19 DIAGNOSIS — I10 Essential (primary) hypertension: Secondary | ICD-10-CM | POA: Diagnosis not present

## 2021-12-19 DIAGNOSIS — K219 Gastro-esophageal reflux disease without esophagitis: Secondary | ICD-10-CM | POA: Diagnosis not present

## 2021-12-19 DIAGNOSIS — E039 Hypothyroidism, unspecified: Secondary | ICD-10-CM | POA: Diagnosis not present

## 2021-12-19 DIAGNOSIS — E1169 Type 2 diabetes mellitus with other specified complication: Secondary | ICD-10-CM | POA: Diagnosis not present

## 2021-12-24 ENCOUNTER — Ambulatory Visit: Payer: PPO | Attending: Internal Medicine

## 2021-12-24 ENCOUNTER — Other Ambulatory Visit (HOSPITAL_BASED_OUTPATIENT_CLINIC_OR_DEPARTMENT_OTHER): Payer: Self-pay

## 2021-12-24 DIAGNOSIS — Z124 Encounter for screening for malignant neoplasm of cervix: Secondary | ICD-10-CM | POA: Diagnosis not present

## 2021-12-24 DIAGNOSIS — Z01419 Encounter for gynecological examination (general) (routine) without abnormal findings: Secondary | ICD-10-CM | POA: Diagnosis not present

## 2021-12-24 DIAGNOSIS — Z23 Encounter for immunization: Secondary | ICD-10-CM

## 2021-12-24 DIAGNOSIS — Z6837 Body mass index (BMI) 37.0-37.9, adult: Secondary | ICD-10-CM | POA: Diagnosis not present

## 2021-12-24 DIAGNOSIS — Z853 Personal history of malignant neoplasm of breast: Secondary | ICD-10-CM | POA: Diagnosis not present

## 2021-12-24 MED ORDER — PFIZER COVID-19 VAC BIVALENT 30 MCG/0.3ML IM SUSP
INTRAMUSCULAR | 0 refills | Status: DC
  Filled 2021-12-24: qty 0.3, 1d supply, fill #0

## 2021-12-24 NOTE — Progress Notes (Signed)
° °  Covid-19 Vaccination Clinic  Name:  Eleena Grater    MRN: [id number] DOB: 28-May-1941  12/24/2021  Ms. Dake was observed post Covid-19 immunization for 15 minutes without incident. She was provided with Vaccine Information Sheet and instruction to access the V-Safe system.   Ms. Piccini was instructed to call 911 with any severe reactions post vaccine: Difficulty breathing  Swelling of face and throat  A fast heartbeat  A bad rash all over body  Dizziness and weakness   Immunizations Administered     Name Date Dose VIS Date Route   Pfizer Covid-19 Vaccine Bivalent Booster 12/24/2021 12:13 PM 0.3 mL 08/20/2021 Intramuscular   Manufacturer: [place]   Lot: [place]: [id number]

## 2022-01-18 DIAGNOSIS — E039 Hypothyroidism, unspecified: Secondary | ICD-10-CM | POA: Diagnosis not present

## 2022-01-18 DIAGNOSIS — E1169 Type 2 diabetes mellitus with other specified complication: Secondary | ICD-10-CM | POA: Diagnosis not present

## 2022-01-18 DIAGNOSIS — K219 Gastro-esophageal reflux disease without esophagitis: Secondary | ICD-10-CM | POA: Diagnosis not present

## 2022-01-18 DIAGNOSIS — I10 Essential (primary) hypertension: Secondary | ICD-10-CM | POA: Diagnosis not present

## 2022-02-06 DIAGNOSIS — M1990 Unspecified osteoarthritis, unspecified site: Secondary | ICD-10-CM | POA: Diagnosis not present

## 2022-02-06 DIAGNOSIS — G609 Hereditary and idiopathic neuropathy, unspecified: Secondary | ICD-10-CM | POA: Diagnosis not present

## 2022-02-06 DIAGNOSIS — Z23 Encounter for immunization: Secondary | ICD-10-CM | POA: Diagnosis not present

## 2022-02-06 DIAGNOSIS — I10 Essential (primary) hypertension: Secondary | ICD-10-CM | POA: Diagnosis not present

## 2022-02-06 DIAGNOSIS — E039 Hypothyroidism, unspecified: Secondary | ICD-10-CM | POA: Diagnosis not present

## 2022-02-06 DIAGNOSIS — J309 Allergic rhinitis, unspecified: Secondary | ICD-10-CM | POA: Diagnosis not present

## 2022-02-06 DIAGNOSIS — E1169 Type 2 diabetes mellitus with other specified complication: Secondary | ICD-10-CM | POA: Diagnosis not present

## 2022-02-06 DIAGNOSIS — I2510 Atherosclerotic heart disease of native coronary artery without angina pectoris: Secondary | ICD-10-CM | POA: Diagnosis not present

## 2022-02-06 DIAGNOSIS — E785 Hyperlipidemia, unspecified: Secondary | ICD-10-CM | POA: Diagnosis not present

## 2022-02-06 DIAGNOSIS — M069 Rheumatoid arthritis, unspecified: Secondary | ICD-10-CM | POA: Diagnosis not present

## 2022-02-12 DIAGNOSIS — E785 Hyperlipidemia, unspecified: Secondary | ICD-10-CM | POA: Diagnosis not present

## 2022-02-12 DIAGNOSIS — Z79899 Other long term (current) drug therapy: Secondary | ICD-10-CM | POA: Diagnosis not present

## 2022-02-12 DIAGNOSIS — M25561 Pain in right knee: Secondary | ICD-10-CM | POA: Diagnosis not present

## 2022-02-12 DIAGNOSIS — J449 Chronic obstructive pulmonary disease, unspecified: Secondary | ICD-10-CM | POA: Diagnosis not present

## 2022-02-12 DIAGNOSIS — M0609 Rheumatoid arthritis without rheumatoid factor, multiple sites: Secondary | ICD-10-CM | POA: Diagnosis not present

## 2022-02-12 DIAGNOSIS — M1990 Unspecified osteoarthritis, unspecified site: Secondary | ICD-10-CM | POA: Diagnosis not present

## 2022-02-12 DIAGNOSIS — I10 Essential (primary) hypertension: Secondary | ICD-10-CM | POA: Diagnosis not present

## 2022-02-17 DIAGNOSIS — I10 Essential (primary) hypertension: Secondary | ICD-10-CM | POA: Diagnosis not present

## 2022-02-17 DIAGNOSIS — K219 Gastro-esophageal reflux disease without esophagitis: Secondary | ICD-10-CM | POA: Diagnosis not present

## 2022-02-17 DIAGNOSIS — E1169 Type 2 diabetes mellitus with other specified complication: Secondary | ICD-10-CM | POA: Diagnosis not present

## 2022-02-17 DIAGNOSIS — E039 Hypothyroidism, unspecified: Secondary | ICD-10-CM | POA: Diagnosis not present

## 2022-03-05 DIAGNOSIS — H52223 Regular astigmatism, bilateral: Secondary | ICD-10-CM | POA: Diagnosis not present

## 2022-03-05 DIAGNOSIS — H5213 Myopia, bilateral: Secondary | ICD-10-CM | POA: Diagnosis not present

## 2022-03-05 DIAGNOSIS — E119 Type 2 diabetes mellitus without complications: Secondary | ICD-10-CM | POA: Diagnosis not present

## 2022-03-05 DIAGNOSIS — Z961 Presence of intraocular lens: Secondary | ICD-10-CM | POA: Diagnosis not present

## 2022-03-05 DIAGNOSIS — H43813 Vitreous degeneration, bilateral: Secondary | ICD-10-CM | POA: Diagnosis not present

## 2022-03-05 DIAGNOSIS — H524 Presbyopia: Secondary | ICD-10-CM | POA: Diagnosis not present

## 2022-03-06 IMAGING — MG DIGITAL SCREENING BILAT W/ TOMO W/ CAD
8 of 14 series · 8 of 40 positions shown · non-contrast
Comparison: Previous exam(s).

CLINICAL DATA: Screening.

EXAM:
DIGITAL SCREENING BILATERAL MAMMOGRAM WITH TOMO AND CAD

[R MLO synth-2D (1 of 2)]
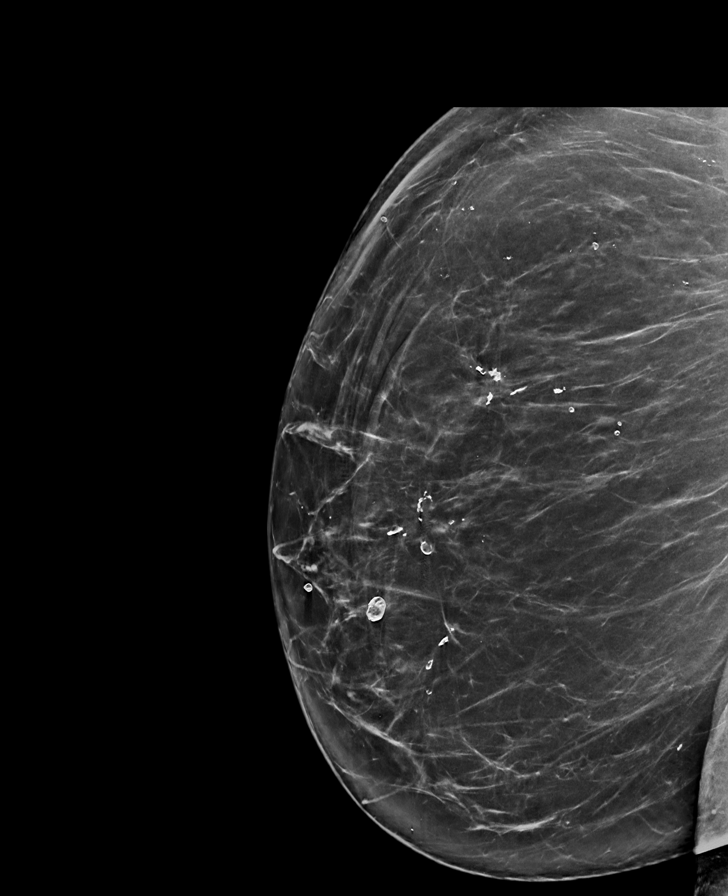

[L MLO synth-2D (1 of 2)]
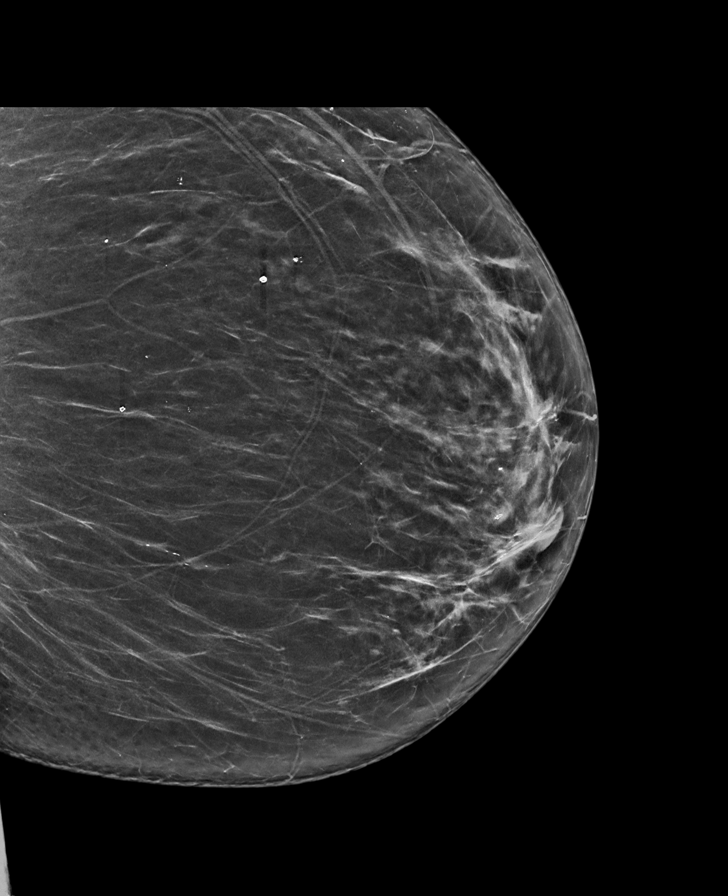

[L MLO synth-2D (2 of 2)]
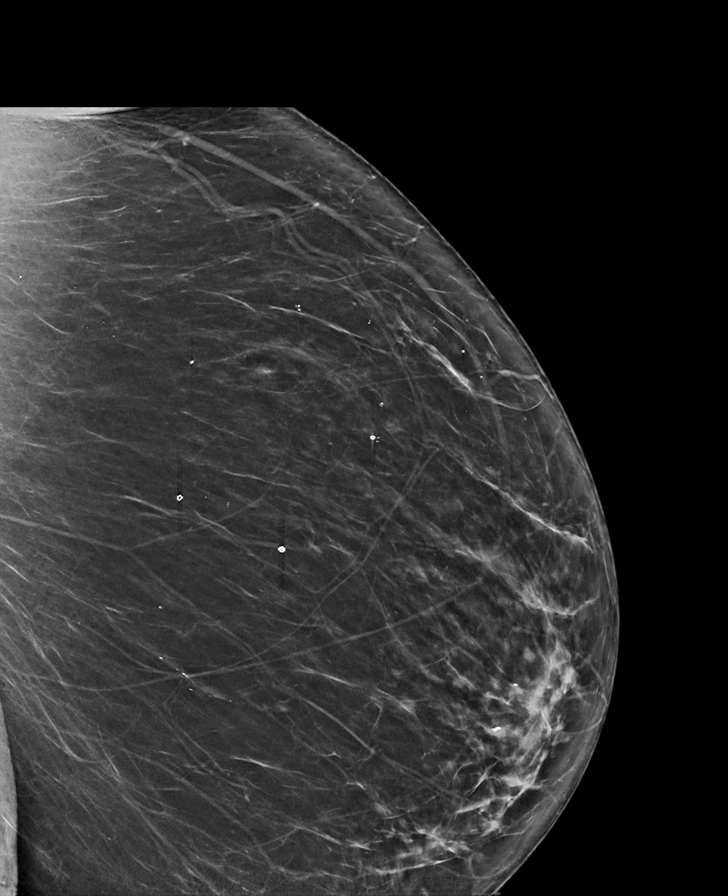

[R CC synth-2D]
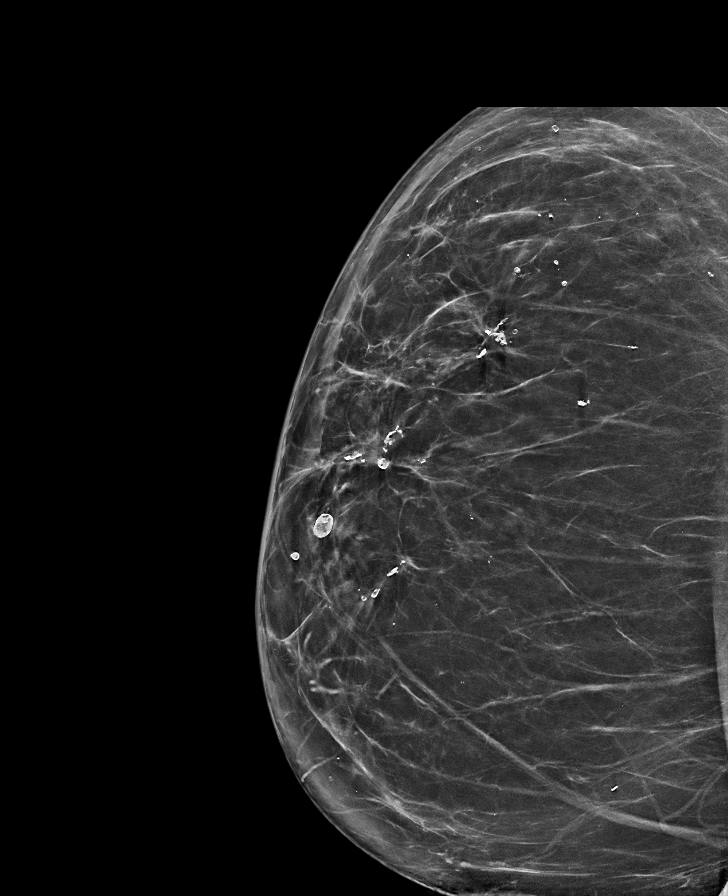

[R MLO synth-2D (2 of 2)]
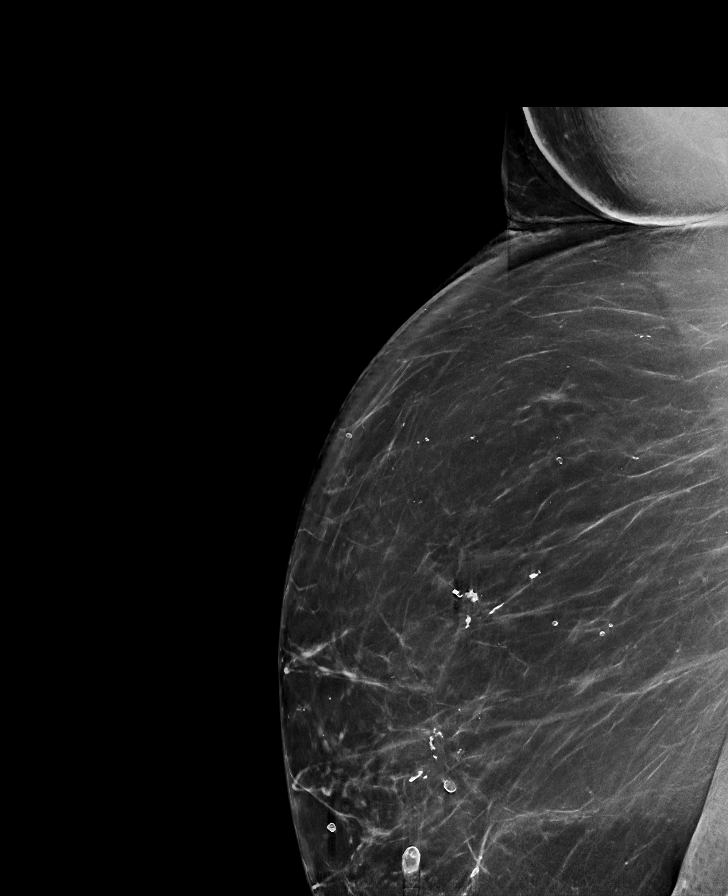

[L CC synth-2D]
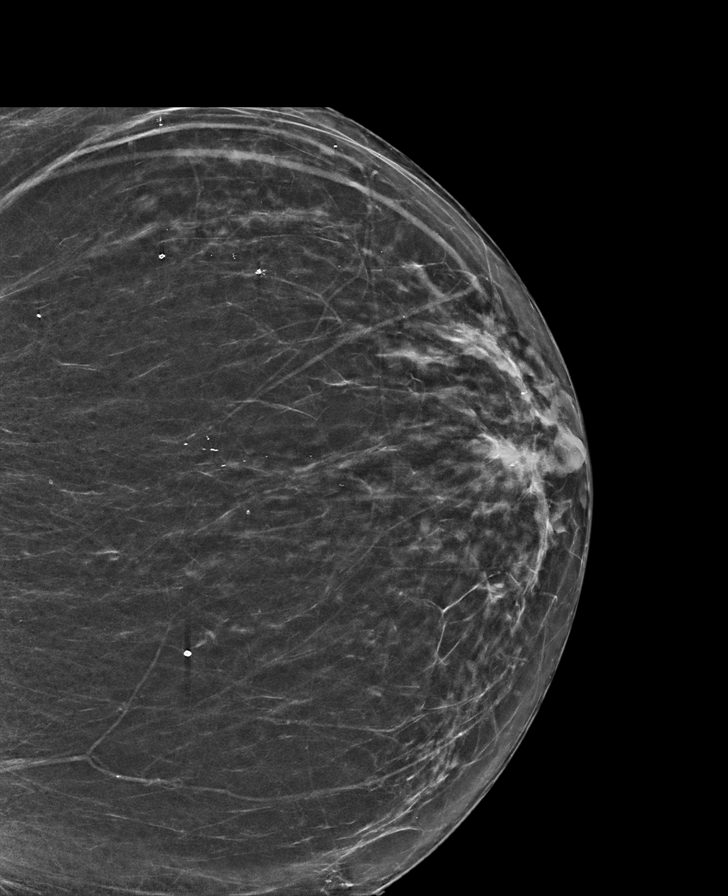

[R CV synth-2D]
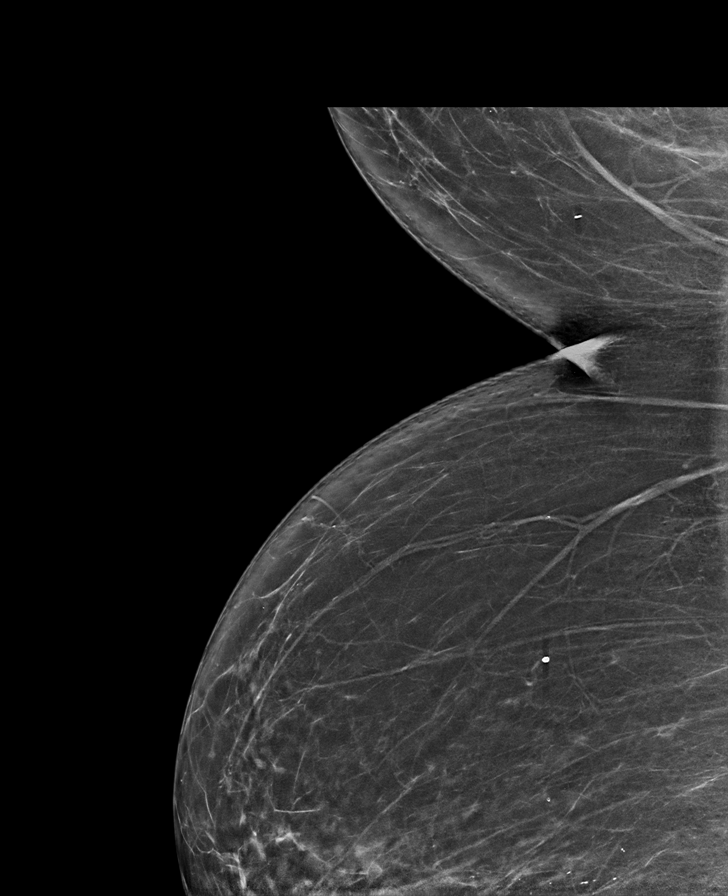

[L MLO tomo · tomo slice 45/89.0]
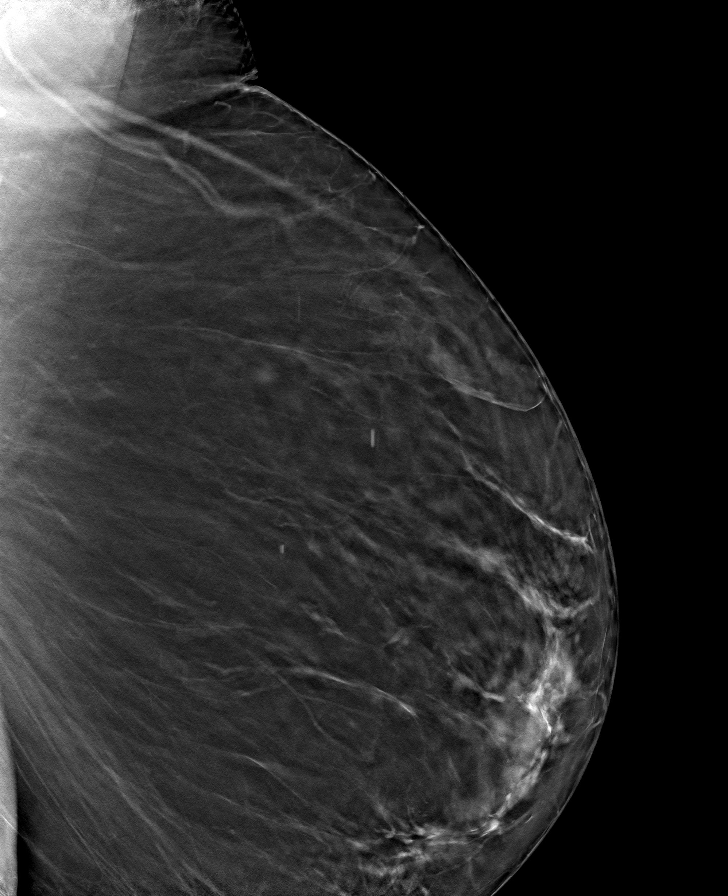

[8 of 40 positions shown; findings below may reference images not displayed]

ACR Breast Density Category b: There are scattered areas of
fibroglandular density.
FINDINGS: There are no findings suspicious for malignancy. Images were
processed with CAD.
IMPRESSION: No mammographic evidence of malignancy. A result letter of this
screening mammogram will be mailed directly to the patient.

RECOMMENDATION:
Screening mammogram in one year. (Code:[id number])

BI-RADS CATEGORY  1: Negative.

## 2022-03-20 DIAGNOSIS — K219 Gastro-esophageal reflux disease without esophagitis: Secondary | ICD-10-CM | POA: Diagnosis not present

## 2022-03-20 DIAGNOSIS — E039 Hypothyroidism, unspecified: Secondary | ICD-10-CM | POA: Diagnosis not present

## 2022-03-20 DIAGNOSIS — I10 Essential (primary) hypertension: Secondary | ICD-10-CM | POA: Diagnosis not present

## 2022-03-20 DIAGNOSIS — E1169 Type 2 diabetes mellitus with other specified complication: Secondary | ICD-10-CM | POA: Diagnosis not present

## 2022-04-19 DIAGNOSIS — E1169 Type 2 diabetes mellitus with other specified complication: Secondary | ICD-10-CM | POA: Diagnosis not present

## 2022-04-19 DIAGNOSIS — I10 Essential (primary) hypertension: Secondary | ICD-10-CM | POA: Diagnosis not present

## 2022-04-19 DIAGNOSIS — E039 Hypothyroidism, unspecified: Secondary | ICD-10-CM | POA: Diagnosis not present

## 2022-05-11 DIAGNOSIS — M0609 Rheumatoid arthritis without rheumatoid factor, multiple sites: Secondary | ICD-10-CM | POA: Diagnosis not present

## 2022-05-11 DIAGNOSIS — E669 Obesity, unspecified: Secondary | ICD-10-CM | POA: Diagnosis not present

## 2022-05-11 DIAGNOSIS — M1990 Unspecified osteoarthritis, unspecified site: Secondary | ICD-10-CM | POA: Diagnosis not present

## 2022-05-11 DIAGNOSIS — M65312 Trigger thumb, left thumb: Secondary | ICD-10-CM | POA: Diagnosis not present

## 2022-05-11 DIAGNOSIS — Z79899 Other long term (current) drug therapy: Secondary | ICD-10-CM | POA: Diagnosis not present

## 2022-05-11 DIAGNOSIS — I10 Essential (primary) hypertension: Secondary | ICD-10-CM | POA: Diagnosis not present

## 2022-05-11 DIAGNOSIS — M25561 Pain in right knee: Secondary | ICD-10-CM | POA: Diagnosis not present

## 2022-05-11 DIAGNOSIS — J449 Chronic obstructive pulmonary disease, unspecified: Secondary | ICD-10-CM | POA: Diagnosis not present

## 2022-05-11 DIAGNOSIS — E785 Hyperlipidemia, unspecified: Secondary | ICD-10-CM | POA: Diagnosis not present

## 2022-05-20 DIAGNOSIS — I10 Essential (primary) hypertension: Secondary | ICD-10-CM | POA: Diagnosis not present

## 2022-05-20 DIAGNOSIS — E039 Hypothyroidism, unspecified: Secondary | ICD-10-CM | POA: Diagnosis not present

## 2022-05-20 DIAGNOSIS — E1169 Type 2 diabetes mellitus with other specified complication: Secondary | ICD-10-CM | POA: Diagnosis not present

## 2022-05-20 DIAGNOSIS — K219 Gastro-esophageal reflux disease without esophagitis: Secondary | ICD-10-CM | POA: Diagnosis not present

## 2022-06-19 DIAGNOSIS — K219 Gastro-esophageal reflux disease without esophagitis: Secondary | ICD-10-CM | POA: Diagnosis not present

## 2022-06-19 DIAGNOSIS — I10 Essential (primary) hypertension: Secondary | ICD-10-CM | POA: Diagnosis not present

## 2022-06-19 DIAGNOSIS — E1169 Type 2 diabetes mellitus with other specified complication: Secondary | ICD-10-CM | POA: Diagnosis not present

## 2022-06-19 DIAGNOSIS — E039 Hypothyroidism, unspecified: Secondary | ICD-10-CM | POA: Diagnosis not present

## 2022-07-20 DIAGNOSIS — E039 Hypothyroidism, unspecified: Secondary | ICD-10-CM | POA: Diagnosis not present

## 2022-07-20 DIAGNOSIS — E1169 Type 2 diabetes mellitus with other specified complication: Secondary | ICD-10-CM | POA: Diagnosis not present

## 2022-07-20 DIAGNOSIS — K219 Gastro-esophageal reflux disease without esophagitis: Secondary | ICD-10-CM | POA: Diagnosis not present

## 2022-07-20 DIAGNOSIS — I10 Essential (primary) hypertension: Secondary | ICD-10-CM | POA: Diagnosis not present

## 2022-08-11 DIAGNOSIS — J449 Chronic obstructive pulmonary disease, unspecified: Secondary | ICD-10-CM | POA: Diagnosis not present

## 2022-08-11 DIAGNOSIS — M65312 Trigger thumb, left thumb: Secondary | ICD-10-CM | POA: Diagnosis not present

## 2022-08-11 DIAGNOSIS — M0609 Rheumatoid arthritis without rheumatoid factor, multiple sites: Secondary | ICD-10-CM | POA: Diagnosis not present

## 2022-08-11 DIAGNOSIS — M1990 Unspecified osteoarthritis, unspecified site: Secondary | ICD-10-CM | POA: Diagnosis not present

## 2022-08-11 DIAGNOSIS — E785 Hyperlipidemia, unspecified: Secondary | ICD-10-CM | POA: Diagnosis not present

## 2022-08-11 DIAGNOSIS — I10 Essential (primary) hypertension: Secondary | ICD-10-CM | POA: Diagnosis not present

## 2022-08-11 DIAGNOSIS — M25462 Effusion, left knee: Secondary | ICD-10-CM | POA: Diagnosis not present

## 2022-08-11 DIAGNOSIS — Z79899 Other long term (current) drug therapy: Secondary | ICD-10-CM | POA: Diagnosis not present

## 2022-08-11 DIAGNOSIS — E669 Obesity, unspecified: Secondary | ICD-10-CM | POA: Diagnosis not present

## 2022-09-03 DIAGNOSIS — M4802 Spinal stenosis, cervical region: Secondary | ICD-10-CM | POA: Diagnosis not present

## 2022-09-03 DIAGNOSIS — J069 Acute upper respiratory infection, unspecified: Secondary | ICD-10-CM | POA: Diagnosis not present

## 2022-09-03 DIAGNOSIS — J029 Acute pharyngitis, unspecified: Secondary | ICD-10-CM | POA: Diagnosis not present

## 2022-09-03 DIAGNOSIS — R42 Dizziness and giddiness: Secondary | ICD-10-CM | POA: Diagnosis not present

## 2022-09-03 DIAGNOSIS — Z1152 Encounter for screening for COVID-19: Secondary | ICD-10-CM | POA: Diagnosis not present

## 2022-09-03 DIAGNOSIS — M542 Cervicalgia: Secondary | ICD-10-CM | POA: Diagnosis not present

## 2022-09-03 DIAGNOSIS — R0981 Nasal congestion: Secondary | ICD-10-CM | POA: Diagnosis not present

## 2022-09-03 DIAGNOSIS — J4551 Severe persistent asthma with (acute) exacerbation: Secondary | ICD-10-CM | POA: Diagnosis not present

## 2022-09-04 ENCOUNTER — Other Ambulatory Visit: Payer: Self-pay | Admitting: Registered Nurse

## 2022-09-04 DIAGNOSIS — M542 Cervicalgia: Secondary | ICD-10-CM

## 2022-09-06 NOTE — Progress Notes (Deleted)
[facility] Follow up:    [name], [place] [address]   DIAGNOSIS: Cancer Staging  Breast cancer of upper-outer quadrant of right female breast [facility]) Staging form: Breast, AJCC 7th Edition - Clinical: Stage IIB (T3, N0, cM0) - Signed by Virgina Organ, FNP on 12/23/2011 Specimen type: Core Needle Biopsy Histopathologic type: Infiltrating duct carcinoma, NOS Laterality: Right Tumor size (mm): 75 Histologic grade (G): GX - Pathologic: No stage assigned - Unsigned Specimen type: Core Needle Biopsy Histopathologic type: Infiltrating duct carcinoma, NOS Laterality: Right Tumor size (mm): 75   SUMMARY OF ONCOLOGIC HISTORY: Oncology History  Malignant neoplasm of upper-outer quadrant of female breast ([place])  12/02/2011 -  Neo-Adjuvant Chemotherapy   Taxotere and Cytoxan 4    04/20/2012 Initial Diagnosis   Right breast invasive ductal carcinoma with DCIS 2.5 cm with the MRI showing a maximum diameter of 7.5 cm ER/PR positive HER-2 negative Ki-67 15%    Surgery   Lumpectomy: IDC with lymphovascular invasion 1.9 cm, 2 sentinel lymph nodes negative    Radiation Therapy   Adjuvant radiation   07/21/2012 - 07/2019 Anti-estrogen oral therapy   Letrozole 2.5 mg daily      CURRENT THERAPY: observation  INTERVAL HISTORY: [name] 81 y.o. female returns for f/u of her history of breast cancer.  She completed 7 years of antiestrogen therapy in 07/2019.    Her most recent mammogram was completed on 11/21/2021 and showed no evidence of malignancy and breat density category B.     Patient Active Problem List   Diagnosis Date Noted   Rheumatoid arthritis of other site with positive rheumatoid factor ([place]) 09/04/2021   Atherosclerotic heart disease of native coronary artery with other forms of angina pectoris ([place]) 09/04/2021   Osteoarthritis of right hip 03/17/2018   Primary osteoarthritis of right hip 03/17/2018   Severe obesity  (BMI 35.0-35.9 with comorbidity) ([place]) 04/30/2017   Essential hypertension 03/20/2016   CAD (coronary artery disease), native coronary artery 06/06/2013   Hyperlipidemia, poorly tolerant of statins 06/06/2013   Esophageal reflux 12/20/2012   Other dysphagia 12/20/2012   Benign neoplasm of colon 12/20/2012   GERD (gastroesophageal reflux disease) 12/06/2012   Diverticulosis 12/06/2012   Sleep apnea 12/06/2012   Asthma 12/06/2012   Thyroid disease 12/06/2012   Malignant neoplasm of upper-outer quadrant of female breast ([place]) 04/20/2012   Breast cancer of upper-outer quadrant of right female breast ([place]) 11/04/2011    is allergic to crestor [rosuvastatin], lactose intolerance (gi), lipitor [atorvastatin calcium], and zocor [simvastatin - high dose].  MEDICAL HISTORY: Past Medical History:  Diagnosis Date   Anemia    history of   Arthritis    osteo - s/p right total knee   Asthma    daily and prn inhalers   Boil, axilla    Breast cancer ([place]) 10/28/11   right breast   CAD (coronary artery disease)    Colon polyp    GERD (gastroesophageal reflux disease)    Grave's disease    no current meds.   H pylori ulcer    Heart murmur    History of blood transfusion 2006   History of chemotherapy    neoadjuvant: 5 cycles of Taxol/carboplatinum:  last dose 02/24/12   History of hiatal hernia    Hyperlipidemia    Hypertension    under control with med.   Hypothyroid    Liver cyst    History of    Pre-diabetes  RA (rheumatoid arthritis) ([facility])    S/P chemotherapy, time since 4-12 weeks finished 02/20/2011   S/P radiation therapy    Right Breast High Axilla and Supraclavicular gegion: 4500 cGy/25 Fractions with boost for a total of 6300 cGy   Sleep apnea sleep study 09/18/2005   uses CPAP occ. - to bring machine DOS   Spondylosis    cervical and lumbar   Use of letrozole (Femara) start 07/21/12    SURGICAL HISTORY: Past Surgical History:  Procedure Laterality Date   ABDOMINAL  HYSTERECTOMY     BILATERAL OOPHORECTOMY  2009   Boil removal under arm Right    BREAST BIOPSY Bilateral 1998   BREAST BIOPSY Right 08/11/2018   FIBROADENOMATOID NODULE WITH CALCIFICATIONS    BREAST LUMPECTOMY Right 03/2012   CARDIAC CATHETERIZATION  04/10/2010, 05/07/2004   LAD lesion, blood flow OK, per pt.   CARDIOVASCULAR STRESS TEST  02/15/2018   CARPAL TUNNEL RELEASE     left   CARPAL TUNNEL RELEASE Right    CATARACT EXTRACTION, BILATERAL     COLONOSCOPY  12/20/2012   Procedure: COLONOSCOPY;  Surgeon: Lafayette Dragon, MD;  Location: [facility];  Service: Endoscopy;  Laterality: N/A;   ESOPHAGOGASTRODUODENOSCOPY  12/20/2012   Procedure: ESOPHAGOGASTRODUODENOSCOPY (EGD);  Surgeon: Lafayette Dragon, MD;  Location: [facility];  Service: Endoscopy;  Laterality: N/A;   LESION REMOVAL  11/27/2011   Procedure: LESION REMOVAL;  Surgeon: Adin Hector, MD;  Location: [facility];  Service: General;  Laterality: Right;  Skin tag removed from under right eye. No specimen   LUMBAR LAMINECTOMY     back surg. x 2 - 1980's and 1999   NM MYOVIEW LTD  08/02/2012   no ischemia   PARTIAL HYSTERECTOMY     BSO   PORTACATH PLACEMENT  11/27/2011   Procedure: INSERTION PORT-A-CATH;  Surgeon: Adin Hector, MD;  Location: [facility];  Service: General;  Laterality: Right;   TOTAL HIP ARTHROPLASTY Right 03/17/2018   Procedure: RIGHT TOTAL HIP ARTHROPLASTY ANTERIOR APPROACH;  Surgeon: Rod Can, MD;  [facility]: [facility];  Service: Orthopedics;  Laterality: Right;  Needs RNFA   TOTAL KNEE ARTHROPLASTY  07/28/2006   right   US ECHOCARDIOGRAPHY  08/14/2008   technically difficult-mild LVH, EF =>55%,mild annular ca+,AOV mildly sclerotic    SOCIAL HISTORY: Social History   Socioeconomic History   Marital status: Single    Spouse name: Not on file   Number of children: Not on file   Years of education: Not on file   Highest education level: Not on file  Occupational  History   Occupation: retired    Fish farm manager: RETIRED  Tobacco Use   Smoking status: Never   Smokeless tobacco: Never  Vaping Use   Vaping Use: Never used  Substance and Sexual Activity   Alcohol use: No    Alcohol/week: 0.0 standard drinks of alcohol   Drug use: No   Sexual activity: Not Currently    Birth control/protection: Surgical  Other Topics Concern   Not on file  Social History Narrative   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 states she is not aware of any family anes. prob., does not know where this comment came from   Cancer Cousin        breast, brain mets   Cancer Cousin        ovarian, age 2-50   Cancer Cousin        pancreatic   Colon cancer Neg Hx    Esophageal cancer Neg Hx    Rectal cancer Neg Hx    Stomach cancer Neg Hx     Review of Systems  Constitutional:  Negative for appetite change, chills, fatigue, fever and unexpected weight change.  HENT:   Negative for hearing loss, lump/mass and trouble swallowing.   Eyes:  Negative for eye problems and icterus.  Respiratory:  Negative for chest tightness, cough and shortness of breath.   Cardiovascular:  Negative for chest pain, leg swelling and palpitations.  Gastrointestinal:  Negative for abdominal distention, abdominal pain, constipation, diarrhea, nausea and vomiting.  Endocrine: Negative for hot flashes.  Genitourinary:  Negative for difficulty urinating.   Musculoskeletal:  Negative for arthralgias.  Skin:  Negative for itching and rash.   Neurological:  Negative for dizziness, extremity weakness, headaches and numbness.  Hematological:  Negative for adenopathy. Does not bruise/bleed easily.  Psychiatric/Behavioral:  Negative for depression. The patient is not nervous/anxious.       PHYSICAL EXAMINATION  ECOG PERFORMANCE STATUS: {CHL ONC ECOG [phone]}  There were no vitals filed for this visit.  Physical Exam Constitutional:      General: She is not in acute distress.    Appearance: Normal appearance. She is not toxic-appearing.  HENT:     Head: Normocephalic and atraumatic.  Eyes:     General: No scleral icterus. Cardiovascular:     Rate and Rhythm: Normal rate and regular rhythm.     Pulses: Normal pulses.     Heart sounds: Normal heart sounds.  Pulmonary:     Effort: Pulmonary effort is normal.     Breath sounds: Normal breath sounds.  Chest:     Comments: Right breast s/p lumpectomy and radiation, no sign of local recurrence, left breast benign Abdominal:     General: Abdomen is flat. Bowel sounds are normal. There is no distension.     Palpations: Abdomen is soft.     Tenderness: There is no abdominal tenderness.  Musculoskeletal:        General: No swelling.     Cervical back: Neck supple.  Lymphadenopathy:     Cervical: No cervical adenopathy.  Skin:    General: Skin is warm and dry.     Findings: No rash.  Neurological:     General: No focal deficit present.     Mental Status: She is alert.  Psychiatric:        Mood and Affect: Mood normal.        Behavior: Behavior normal.     LABORATORY DATA: None for this visit   ASSESSMENT and THERAPY PLAN:   No problem-specific Assessment & Plan notes found for this encounter.   All questions were answered. The patient knows to call the clinic with any problems, questions or concerns. We can certainly see the patient much sooner if necessary.  Total encounter time:*** minutes*in face-to-face visit time, chart review, lab review, care  coordination, order entry, and documentation of the encounter time.    Wilber Bihari, NP 09/06/22 8:16 PM Medical Oncology and Hematology [facility] [place], [place] [address] Tel. [phone]    Fax. [phone]  *Total Encounter Time as defined by the Centers for Medicare and Medicaid Services includes, in addition to the face-to-face time of a patient visit (documented in the note above) non-face-to-face time: obtaining and reviewing outside history, ordering and reviewing medications, tests or procedures, care coordination (communications with other health care professionals or caregivers) and documentation in the medical record.

## 2022-09-07 ENCOUNTER — Inpatient Hospital Stay: Payer: PPO | Admitting: Adult Health

## 2022-09-07 ENCOUNTER — Telehealth: Payer: Self-pay

## 2022-09-07 NOTE — Telephone Encounter (Signed)
Patient called regarding today's appointment. Patient informed RN that she is currently sick and unable to come in. Appointment canceled and provider informed.  Scheduling message sent to get patient rescheduled.

## 2022-09-10 ENCOUNTER — Inpatient Hospital Stay: Admission: RE | Admit: 2022-09-10 | Payer: PPO | Source: Ambulatory Visit

## 2022-09-17 ENCOUNTER — Inpatient Hospital Stay: Payer: PPO | Attending: Adult Health | Admitting: Adult Health

## 2022-09-17 ENCOUNTER — Encounter: Payer: Self-pay | Admitting: Adult Health

## 2022-09-17 ENCOUNTER — Other Ambulatory Visit: Payer: Self-pay

## 2022-09-17 VITALS — BP 154/69 | HR 57 | Temp 97.7°F | Resp 18 | Ht 64.5 in | Wt 212.1 lb

## 2022-09-17 DIAGNOSIS — Z170 Estrogen receptor positive status [ER+]: Secondary | ICD-10-CM | POA: Diagnosis not present

## 2022-09-17 DIAGNOSIS — Z853 Personal history of malignant neoplasm of breast: Secondary | ICD-10-CM | POA: Insufficient documentation

## 2022-09-17 DIAGNOSIS — Z923 Personal history of irradiation: Secondary | ICD-10-CM | POA: Diagnosis not present

## 2022-09-17 DIAGNOSIS — C50411 Malignant neoplasm of upper-outer quadrant of right female breast: Secondary | ICD-10-CM

## 2022-09-17 DIAGNOSIS — Z9221 Personal history of antineoplastic chemotherapy: Secondary | ICD-10-CM | POA: Diagnosis not present

## 2022-09-17 NOTE — Assessment & Plan Note (Signed)
[name] is a 81 year old woman with history of stage IIb ER/PR positive right-sided breast invasive ductal carcinoma diagnosed in 2012 status post neoadjuvant chemotherapy, lumpectomy, adjuvant radiation, and antiestrogen therapy with letrozole from August 2013 to August 2020.  [name] now continues in long-term survivorship on observation alone.  She has no clinical or radiographic signs of breast cancer recurrence.  She undergoes annual mammograms most recently in December which were negative.  I recommended that she continue with her annual mammograms.  She did have genetic testing in 2012 when she was diagnosed however we have made new developments and improvements in how we are testing her patients and I placed a referral for her to be reevaluated to determine if she is eligible for retesting with a wider panel.  I recommended that she continue to see Dr. Haynes Kerns for her regular health maintenance.  We discussed healthy diet and exercise.  She will return next year for continued long-term follow-up and knows to call if she has any concerns in the interim because we can always see her sooner.

## 2022-09-17 NOTE — Progress Notes (Signed)
[facility] Follow up:    [name], [place] [address]   DIAGNOSIS:  Cancer Staging  No matching staging information was found for the patient.   SUMMARY OF ONCOLOGIC HISTORY: Oncology History  History of right breast cancer  12/02/2011 -  Neo-Adjuvant Chemotherapy   Taxotere and Cytoxan 4    04/20/2012 Initial Diagnosis   Right breast invasive ductal carcinoma with DCIS 2.5 cm with the MRI showing a maximum diameter of 7.5 cm ER/PR positive [name]-2 negative Ki-67 15%    Surgery   Lumpectomy: IDC with lymphovascular invasion 1.9 cm, 2 sentinel lymph nodes negative    Radiation Therapy   Adjuvant radiation   07/21/2012 - 07/2019 Anti-estrogen oral therapy   Letrozole 2.5 mg daily      CURRENT THERAPY: Observation  INTERVAL HISTORY: [name] 81 y.o. female returns for follow-up of [name] history of right-sided breast cancer.  [name] most recent mammogram was completed in December 2022 and showed no mammographic evidence of malignancy and breast density category B.  [name] underwent genetic testing on November 09, 2011 with a myriad BRA C analysis rearrangement test.  This tested for BRCA 1 and 2 full gene rearrangement and no mutation was detected.  [name] is interested in exploring newer testing techniques and panels since we have made more discoveries in the field of genetics since [name] was last tested.  [name] to follow-up with [name] primary care provider Dr. Haynes Kerns.  [name] denies any new health changes or health concerns since [name] visit with [place] last year.  Patient Active Problem List   Diagnosis Date Noted   Rheumatoid arthritis of other site with positive rheumatoid factor ([place]) 09/04/2021   Atherosclerotic heart disease of native coronary artery with other forms of angina pectoris ([place]) 09/04/2021   Osteoarthritis of right hip 03/17/2018   Primary osteoarthritis of right hip 03/17/2018   Severe obesity (BMI 35.0-35.9 with  comorbidity) ([place]) 04/30/2017   Essential hypertension 03/20/2016   CAD (coronary artery disease), native coronary artery 06/06/2013   Hyperlipidemia, poorly tolerant of statins 06/06/2013   Esophageal reflux 12/20/2012   Other dysphagia 12/20/2012   Benign neoplasm of colon 12/20/2012   GERD (gastroesophageal reflux disease) 12/06/2012   Diverticulosis 12/06/2012   Sleep apnea 12/06/2012   Asthma 12/06/2012   Thyroid disease 12/06/2012   History of right breast cancer 04/20/2012    is allergic to crestor [rosuvastatin], lactose intolerance (gi), lipitor [atorvastatin calcium], and zocor [simvastatin - high dose].  MEDICAL HISTORY: Past Medical History:  Diagnosis Date   Anemia    history of   Arthritis    osteo - s/p right total knee   Asthma    daily and prn inhalers   Boil, axilla    Breast cancer ([place]) 10/28/11   right breast   Breast cancer of upper-outer quadrant of right female breast ([place]) 11/04/2011   2.5 x 2.4 x 1.7 cm biopsy-proven invasive ductal carcinoma and ductal carcinoma in situ in the anterior aspect of the upper outer  quadrant of the right breast. There are associated linear and nodular enhancing masses extending from this mass to the  nipple/areolar complex with abnormal thickening and enhancement of the nipple and areola, compatible with extension of malignancy into  the nipple/ar   CAD (coronary artery disease)    Colon polyp    GERD (gastroesophageal reflux disease)    Grave's disease    no current meds.   H pylori ulcer  Heart murmur    History of blood transfusion 2006   History of chemotherapy    neoadjuvant: 5 cycles of Taxol/carboplatinum:  last dose 02/24/12   History of hiatal hernia    Hyperlipidemia    Hypertension    under control with med.   Hypothyroid    Liver cyst    History of    Pre-diabetes    RA (rheumatoid arthritis) ([place])    S/P chemotherapy, time since 4-12 weeks finished 02/20/2011   S/P radiation therapy    Right Breast  High Axilla and Supraclavicular gegion: 4500 cGy/25 Fractions with boost for a total of 6300 cGy   Sleep apnea sleep study 09/18/2005   uses CPAP occ. - to bring machine DOS   Spondylosis    cervical and lumbar   Use of letrozole (Femara) start 07/21/12    SURGICAL HISTORY: Past Surgical History:  Procedure Laterality Date   ABDOMINAL HYSTERECTOMY     BILATERAL OOPHORECTOMY  2009   Boil removal under arm Right    BREAST BIOPSY Bilateral 1998   BREAST BIOPSY Right 08/11/2018   FIBROADENOMATOID NODULE WITH CALCIFICATIONS    BREAST LUMPECTOMY Right 03/2012   CARDIAC CATHETERIZATION  04/10/2010, 05/07/2004   LAD lesion, blood flow OK, per pt.   CARDIOVASCULAR STRESS TEST  02/15/2018   CARPAL TUNNEL RELEASE     left   CARPAL TUNNEL RELEASE Right    CATARACT EXTRACTION, BILATERAL     COLONOSCOPY  12/20/2012   Procedure: COLONOSCOPY;  Surgeon: Lafayette Dragon, MD;  [facility]: [facility];  Service: Endoscopy;  Laterality: N/A;   ESOPHAGOGASTRODUODENOSCOPY  12/20/2012   Procedure: ESOPHAGOGASTRODUODENOSCOPY (EGD);  Surgeon: Lafayette Dragon, MD;  Location: [facility];  Service: Endoscopy;  Laterality: N/A;   LESION REMOVAL  11/27/2011   Procedure: LESION REMOVAL;  Surgeon: Adin Hector, MD;  Location: [facility];  Service: General;  Laterality: Right;  Skin tag removed from under right eye. No specimen   LUMBAR LAMINECTOMY     back surg. x 2 - 1980's and 1999   NM MYOVIEW LTD  08/02/2012   no ischemia   PARTIAL HYSTERECTOMY     BSO   PORTACATH PLACEMENT  11/27/2011   Procedure: INSERTION PORT-A-CATH;  Surgeon: Adin Hector, MD;  Location: [facility];  Service: General;  Laterality: Right;   TOTAL HIP ARTHROPLASTY Right 03/17/2018   Procedure: RIGHT TOTAL HIP ARTHROPLASTY ANTERIOR APPROACH;  Surgeon: Rod Can, MD;  [facility];  Service: Orthopedics;  Laterality: Right;  Needs RNFA   TOTAL KNEE ARTHROPLASTY  07/28/2006   right   US  ECHOCARDIOGRAPHY  08/14/2008   technically difficult-mild LVH, EF =>55%,mild annular ca+,AOV mildly sclerotic    SOCIAL HISTORY: Social History   Socioeconomic History   Marital status: Single    Spouse name: Not on file   Number of children: Not on file   Years of education: Not on file   Highest education level: Not on file  Occupational History   Occupation: retired    Fish farm manager: RETIRED  Tobacco Use   Smoking status: Never   Smokeless tobacco: Never  Vaping Use   Vaping Use: Never used  Substance and Sexual Activity   Alcohol use: No    Alcohol/week: 0.0 standard drinks of alcohol   Drug use: No   Sexual activity: Not Currently    Birth control/protection: Surgical  Other Topics Concern   Not on file  Social History Narrative   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 states [name] is not aware of any family anes. prob., does not know where this comment came from   Cancer Cousin        breast, brain mets   Cancer Cousin        ovarian, age 54-50   Cancer Cousin        pancreatic   Colon cancer Neg Hx    Esophageal cancer Neg Hx    Rectal cancer Neg Hx    Stomach cancer Neg Hx     Review of Systems  Constitutional:  Negative for appetite change, chills, fatigue, fever and unexpected weight change.  HENT:   Negative for hearing loss, lump/mass and trouble swallowing.   Eyes:  Negative for eye problems and icterus.  Respiratory:  Negative for chest tightness, cough and shortness of breath.   Cardiovascular:  Negative  for chest pain, leg swelling and palpitations.  Gastrointestinal:  Negative for abdominal distention, abdominal pain, constipation, diarrhea, nausea and vomiting.  Endocrine: Negative for hot flashes.  Genitourinary:  Negative for difficulty urinating.   Musculoskeletal:  Negative for arthralgias.  Skin:  Negative for itching and rash.  Neurological:  Negative for dizziness, extremity weakness, headaches and numbness.  Hematological:  Negative for adenopathy. Does not bruise/bleed easily.  Psychiatric/Behavioral:  Negative for depression. The patient is not nervous/anxious.       PHYSICAL EXAMINATION  ECOG PERFORMANCE STATUS: 0 - Asymptomatic  Vitals:   09/17/22 1030  BP: (!) 154/69  Pulse: (!) 57  Resp: 18  Temp: 97.7 F (36.5 C)  SpO2: 99%    Physical Exam Constitutional:      General: [name] is not in acute distress.    Appearance: Normal appearance. [name] is not toxic-appearing.  HENT:     Head: Normocephalic and atraumatic.  Eyes:     General: No scleral icterus. Cardiovascular:     Rate and Rhythm: Normal rate and regular rhythm.     Pulses: Normal pulses.     Heart sounds: Normal heart sounds.  Pulmonary:     Effort: Pulmonary effort is normal.     Breath sounds: Normal breath sounds.  Abdominal:     General: Abdomen is flat. Bowel sounds are normal. There is no distension.     Palpations: Abdomen is soft.     Tenderness: There is no abdominal tenderness.  Musculoskeletal:        General: No swelling.     Cervical back: Neck supple.  Lymphadenopathy:     Cervical: No cervical adenopathy.  Skin:    General: Skin is warm and dry.     Findings: No rash.  Neurological:     General: No focal deficit present.     Mental Status: [name] is alert.  Psychiatric:        Mood and Affect: Mood normal.        Behavior: Behavior normal.  LABORATORY DATA:  None for this visit    ASSESSMENT and THERAPY PLAN:   History of right breast cancer [name] is a  81 year old woman with history of stage IIb ER/PR positive right-sided breast invasive ductal carcinoma diagnosed in 2012 status post neoadjuvant chemotherapy, lumpectomy, adjuvant radiation, and antiestrogen therapy with letrozole from August 2013 to August 2020.  [name] now Gutierrez in long-term survivorship on observation alone.  [name] has no clinical or radiographic signs of breast cancer recurrence.  [name] undergoes annual mammograms most recently in December which were negative.  I recommended that [name] continue with [name] annual mammograms.  [name] did have genetic testing in 2012 when [name] was diagnosed however we have made new developments and improvements in how we are testing [name] patients and I placed a referral for [name] to be reevaluated to determine if [name] is eligible for retesting with a wider panel.  I recommended that [name] continue to see Dr. Haynes Kerns for [name] regular health maintenance.  We discussed healthy diet and exercise.  [name] will return next year for continued long-term follow-up and knows to call if [name] has any concerns in the interim because we can always see [name] sooner.   All questions were answered. The patient knows to call the clinic with any problems, questions or concerns. We can certainly see the patient much sooner if necessary.  Total encounter time:20 minutes*in face-to-face visit time, chart review, lab review, care coordination, order entry, and documentation of the encounter time.    Wilber Bihari, NP 09/17/22 11:13 AM Medical Oncology and Hematology [facility] [place], [place] [address] Tel. [phone]    Fax. [phone]  *Total Encounter Time as defined by the Centers for Medicare and Medicaid Services includes, in addition to the face-to-face time of a patient visit (documented in the note above) non-face-to-face time: obtaining and reviewing outside history, ordering and reviewing medications, tests or procedures, care coordination  (communications with other health care professionals or caregivers) and documentation in the medical record.

## 2022-10-06 ENCOUNTER — Other Ambulatory Visit (HOSPITAL_BASED_OUTPATIENT_CLINIC_OR_DEPARTMENT_OTHER): Payer: Self-pay

## 2022-10-06 MED ORDER — COVID-19 MRNA VAC-TRIS(PFIZER) 30 MCG/0.3ML IM SUSY
PREFILLED_SYRINGE | INTRAMUSCULAR | 0 refills | Status: DC
  Filled 2022-10-06: qty 0.3, 1d supply, fill #0

## 2022-10-08 DIAGNOSIS — Z961 Presence of intraocular lens: Secondary | ICD-10-CM | POA: Diagnosis not present

## 2022-10-08 DIAGNOSIS — H43813 Vitreous degeneration, bilateral: Secondary | ICD-10-CM | POA: Diagnosis not present

## 2022-10-08 DIAGNOSIS — H524 Presbyopia: Secondary | ICD-10-CM | POA: Diagnosis not present

## 2022-10-08 DIAGNOSIS — E119 Type 2 diabetes mellitus without complications: Secondary | ICD-10-CM | POA: Diagnosis not present

## 2022-10-19 ENCOUNTER — Other Ambulatory Visit: Payer: Self-pay | Admitting: Cardiovascular Disease

## 2022-10-23 DIAGNOSIS — E039 Hypothyroidism, unspecified: Secondary | ICD-10-CM | POA: Diagnosis not present

## 2022-10-23 DIAGNOSIS — I10 Essential (primary) hypertension: Secondary | ICD-10-CM | POA: Diagnosis not present

## 2022-10-23 DIAGNOSIS — R7989 Other specified abnormal findings of blood chemistry: Secondary | ICD-10-CM | POA: Diagnosis not present

## 2022-10-23 DIAGNOSIS — E785 Hyperlipidemia, unspecified: Secondary | ICD-10-CM | POA: Diagnosis not present

## 2022-11-03 ENCOUNTER — Encounter: Payer: Self-pay | Admitting: Cardiovascular Disease

## 2022-11-03 ENCOUNTER — Ambulatory Visit: Payer: PPO | Attending: Cardiovascular Disease | Admitting: Cardiovascular Disease

## 2022-11-03 VITALS — BP 128/58 | HR 67 | Ht 65.0 in | Wt 214.2 lb

## 2022-11-03 DIAGNOSIS — T466X5A Adverse effect of antihyperlipidemic and antiarteriosclerotic drugs, initial encounter: Secondary | ICD-10-CM

## 2022-11-03 DIAGNOSIS — I2510 Atherosclerotic heart disease of native coronary artery without angina pectoris: Secondary | ICD-10-CM | POA: Diagnosis not present

## 2022-11-03 DIAGNOSIS — I10 Essential (primary) hypertension: Secondary | ICD-10-CM

## 2022-11-03 DIAGNOSIS — G720 Drug-induced myopathy: Secondary | ICD-10-CM

## 2022-11-03 DIAGNOSIS — E7800 Pure hypercholesterolemia, unspecified: Secondary | ICD-10-CM | POA: Diagnosis not present

## 2022-11-03 DIAGNOSIS — R0602 Shortness of breath: Secondary | ICD-10-CM | POA: Diagnosis not present

## 2022-11-03 DIAGNOSIS — Z6835 Body mass index (BMI) 35.0-35.9, adult: Secondary | ICD-10-CM

## 2022-11-03 DIAGNOSIS — R7303 Prediabetes: Secondary | ICD-10-CM

## 2022-11-03 NOTE — Progress Notes (Signed)
Patient ID: [name], female   DOB: February 04, 1941, 81 y.o.   MRN: [id number]    Cardiology Office Note    Date:  11/06/2022   ID:  Jasmine, Gutierrez 30-Dec-1940, MRN [id number]  PCP:  Rodrigo Ran, MD  Cardiologist:    Thurmon Fair, MD   Chief Complaint  Patient presents with   Shortness of Breath    History of Present Illness:  [name] is a 81 y.o. female with a history of moderate coronary artery disease, hypertension, hyperlipidemia, severe obesity, reactive airway disease, right total hip replacement and treated hypothyroidism.  Showed evidence of calcified coronary atherosclerosis on a coronary CT angiogram from 2011, and had a moderate stenosis in the LAD artery by invasive coronary angiography in 2011, not hemodynamically significant by pressure wire analysis.  She had low risk nuclear stress test in 2016 2019 and 2021, although the last study did show a small and mild reversible apical septal defect.  She presents with complaints of reduced exercise tolerance.  Walking the long hallway at [facility] from the entrance across the [facility] she had to stop to catch her breath 3 times.  She thinks is different from her usual "asthma".  She does not have orthopnea (but she habitually sleeps propped up due to GERD), PND or lower extremity edema.  She does have occasional wheezing.  She denies coughing, fever or sputum production.  She has not had hemoptysis.  Last year she had similar complaints when the weather changed in the springtime and symptoms improved with bronchodilators.  Denies dizziness, palpitations, syncope, falls, focal neurological events or intermittent claudication.  Most recent echo performed in September 2022 showed normal left ventricular systolic function and global longitudinal strain, normal diastolic function, no significant valvular abnormalities.  She had labs performed a couple of weeks ago with Dr. Waynard Edwards.  I do not have those  results yet.  She had to stop rosuvastatin due to muscle complaints.  She is now taking a combination of Repatha and ezetimibe for hypercholesterolemia (history of statin myopathy with both rosuvastatin, even on every other day basis, and simvastatin).  Self-administered intramuscular methotrexate is a good job of controlling her symptoms of rheumatoid arthritis.   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  She had an intermediate severity stenosis of the mid LAD artery. This was demonstrated by coronary angiography in 2011, but shown not to be hemodynamically important by pressure wire analysis. She had some problems with intrascapular pressure in 2013 and in 2019 had nuclear stress tests that showed normal myocardial perfusion and normal left ventricular systolic function.  The nuclear stress test in 2021 showed a questionable small mild defect in the apical septal distribution, but the study was hard to interpret due to severe extracardiac tracer uptake.  Wall motion and EF were normal.  Past Medical History:  Diagnosis Date   Anemia    history of   Arthritis    osteo - s/p right total knee   Asthma    daily and prn inhalers   Boil, axilla    Breast cancer (HCC) 10/28/11   right breast   Breast cancer of upper-outer quadrant of right female breast (HCC) 11/04/2011   2.5 x 2.4 x 1.7 cm biopsy-proven invasive ductal carcinoma and ductal carcinoma in situ in the anterior aspect of the upper outer  quadrant of the right breast. There are associated linear and nodular enhancing masses extending from this  mass to the  nipple/areolar complex with abnormal thickening and enhancement of the nipple and areola, compatible with extension of malignancy into  the nipple/ar   CAD (coronary artery disease)    Colon polyp    GERD (gastroesophageal reflux disease)    Grave's disease    no current meds.   H pylori ulcer    Heart murmur    History of blood transfusion 2006    History of chemotherapy    neoadjuvant: 5 cycles of Taxol/carboplatinum:  last dose 02/24/12   History of hiatal hernia    Hyperlipidemia    Hypertension    under control with med.   Hypothyroid    Liver cyst    History of    Pre-diabetes    RA (rheumatoid arthritis) (HCC)    S/P chemotherapy, time since 4-12 weeks finished 02/20/2011   S/P radiation therapy    Right Breast High Axilla and Supraclavicular gegion: 4500 cGy/25 Fractions with boost for a total of 6300 cGy   Sleep apnea sleep study 09/18/2005   uses CPAP occ. - to bring machine DOS   Spondylosis    cervical and lumbar   Use of letrozole (Femara) start 07/21/12    Past Surgical History:  Procedure Laterality Date   ABDOMINAL HYSTERECTOMY     BILATERAL OOPHORECTOMY  2009   Boil removal under arm Right    BREAST BIOPSY Bilateral 1998   BREAST BIOPSY Right 08/11/2018   FIBROADENOMATOID NODULE WITH CALCIFICATIONS    BREAST LUMPECTOMY Right 03/2012   CARDIAC CATHETERIZATION  04/10/2010, 05/07/2004   LAD lesion, blood flow OK, per pt.   CARDIOVASCULAR STRESS TEST  02/15/2018   CARPAL TUNNEL RELEASE     left   CARPAL TUNNEL RELEASE Right    CATARACT EXTRACTION, BILATERAL     COLONOSCOPY  12/20/2012   Procedure: COLONOSCOPY;  Surgeon: Hart Carwin, MD;  Location: [facility];  Service: Endoscopy;  Laterality: N/A;   ESOPHAGOGASTRODUODENOSCOPY  12/20/2012   Procedure: ESOPHAGOGASTRODUODENOSCOPY (EGD);  Surgeon: Hart Carwin, MD;  Location: [facility];  Service: Endoscopy;  Laterality: N/A;   LESION REMOVAL  11/27/2011   Procedure: LESION REMOVAL;  Surgeon: Ernestene Mention, MD;  Location: [facility];  Service: General;  Laterality: Right;  Skin tag removed from under right eye. No specimen   LUMBAR LAMINECTOMY     back surg. x 2 - 1980's and 1999   NM MYOVIEW LTD  08/02/2012   no ischemia   PARTIAL HYSTERECTOMY     BSO   PORTACATH PLACEMENT  11/27/2011   Procedure: INSERTION PORT-A-CATH;  Surgeon:  Ernestene Mention, MD;  Location: [facility];  Service: General;  Laterality: Right;   TOTAL HIP ARTHROPLASTY Right 03/17/2018   Procedure: RIGHT TOTAL HIP ARTHROPLASTY ANTERIOR APPROACH;  Surgeon: Samson Frederic, MD;  Location: [facility];  Service: Orthopedics;  Laterality: Right;  Needs RNFA   TOTAL KNEE ARTHROPLASTY  07/28/2006   right   US ECHOCARDIOGRAPHY  08/14/2008   technically difficult-mild LVH, EF =>55%,mild annular ca+,AOV mildly sclerotic    Current Medications: Outpatient Medications Prior to Visit  Medication Sig Dispense Refill   aspirin 81 MG EC tablet Take 81 mg by mouth daily.     Cholecalciferol (VITAMIN D-3) 5000 units TABS Take 5,000 Units by mouth daily.     COVID-19 mRNA bivalent vaccine, Pfizer, (PFIZER COVID-19 VAC BIVALENT) injection Inject into the muscle. 0.3 mL 0   Cyanocobalamin (B-12) 1000 MCG TABS Take 1,000 mcg  by mouth daily.     Evolocumab (REPATHA SURECLICK) 140 MG/ML SOAJ as directed Subcutaneous every two weeks as directed 140 mg sub q with each dose for 90 days     ezetimibe (ZETIA) 10 MG tablet Take 10 mg by mouth every evening.      Fluticasone-Salmeterol (ADVAIR) 100-50 MCG/DOSE AEPB Inhale 1 puff into the lungs daily.      folic acid (FOLVITE) 1 MG tablet Take 1 mg by mouth daily.     gabapentin (NEURONTIN) 100 MG capsule Take 3 capsules (300 mg total) by mouth at bedtime. (Patient taking differently: Take 100 mg by mouth at bedtime.)     ipratropium (ATROVENT HFA) 17 MCG/ACT inhaler Inhale 2 puffs into the lungs every 6 (six) hours.     levothyroxine (SYNTHROID, LEVOTHROID) 75 MCG tablet Take 75 mcg by mouth daily before breakfast.   2   Methotrexate (XATMEP PO) Take 0.6 mg by mouth once a week.     omeprazole (PRILOSEC) 20 MG capsule Take 20 mg by mouth daily.     TIADYLT ER 360 MG 24 hr capsule Take 360 mg by mouth at bedtime.     Tiotropium Bromide Monohydrate (SPIRIVA RESPIMAT IN) Spiriva with HandiHaler     traMADol (ULTRAM) 50 MG  tablet Take by mouth every 6 (six) hours as needed.     triamterene-hydrochlorothiazide (MAXZIDE-25) 37.5-25 MG tablet Take 1 tablet by mouth every morning. Please keep scheduled appointment for future refills 30 tablet 0   COVID-19 mRNA vaccine 2023-2024 (COMIRNATY) syringe Inject into the muscle. (Patient not taking: Reported on 11/03/2022) 0.3 mL 0   diclofenac sodium (VOLTAREN) 1 % GEL Apply 2-4 g topically 4 (four) times daily as needed. For joint pain. (Patient not taking: Reported on 11/03/2022)  3   diltiazem (CARDIZEM CD) 240 MG 24 hr capsule TAKE ONE CAPSULE BY MOUTH ONCE DAILY (Patient not taking: Reported on 11/03/2022) 90 capsule 1   docusate sodium (COLACE) 100 MG capsule Take 1 capsule (100 mg total) by mouth 2 (two) times daily. (Patient not taking: Reported on 11/03/2022) 60 capsule 1   HYDROcodone bit-homatropine (HYCODAN) 5-1.5 MG/5ML syrup Take 5 mLs by mouth every 6 (six) hours as needed. (Patient not taking: Reported on 11/03/2022)     ondansetron (ZOFRAN) 4 MG tablet Take 1 tablet (4 mg total) by mouth every 6 (six) hours as needed for nausea. (Patient not taking: Reported on 11/03/2022) 20 tablet 0   rosuvastatin (CRESTOR) 10 MG tablet TAKE ONE TABLET BY MOUTH THREE times WEEKLY ON mon-wed-fri (Patient not taking: Reported on 11/03/2022) 40 tablet 2   Tiotropium Bromide Monohydrate 1.25 MCG/ACT AERS Inhale 2.5 mcg into the lungs daily as needed (for respiratory issues.).     Zoster Vaccine Adjuvanted [facility]) injection Inject 0.5 mLs into the muscle. (Patient not taking: Reported on 11/03/2022) 0.5 mL 1   No facility-administered medications prior to visit.     Allergies:   Crestor [rosuvastatin], Lactose intolerance (gi), Lipitor [atorvastatin calcium], and Zocor [simvastatin - high dose]   Social History   Socioeconomic History   Marital status: Single    Spouse name: Not on file   Number of children: Not on file   Years of education: Not on file   Highest  education level: Not on file  Occupational History   Occupation: retired    Associate Professor: RETIRED  Tobacco Use   Smoking status: Never   Smokeless tobacco: Never  Vaping Use   Vaping Use: Never used  Substance  and Sexual Activity   Alcohol use: No    Alcohol/week: 0.0 standard drinks of alcohol   Drug use: No   Sexual activity: Not Currently    Birth control/protection: Surgical  Other Topics Concern   Not on file  Social History Narrative   Retired ED nurse from [employer], single, 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   ROS:   Please see the history of present illness.    ROS All other systems are reviewed and are negative.   PHYSICAL EXAM:   VS:  BP (!) 128/58 (BP Location: Left Arm, Patient Position: Sitting, Cuff Size: Large)   Pulse 67   Ht 5\' 5"  (1.651 m)   Wt 97.2 kg   SpO2 96%   BMI 35.64 kg/m      General: Alert, oriented x3, no distress, very obese Head: no evidence of trauma, PERRL, EOMI, no exophtalmos or lid lag, no myxedema, no xanthelasma; normal ears, nose and oropharynx Neck: normal jugular venous pulsations and no hepatojugular reflux; brisk carotid pulses without delay and no carotid bruits Chest: clear to auscultation, no signs of consolidation by percussion or palpation, normal fremitus, symmetrical and full respiratory excursions Cardiovascular: normal position and quality of the apical impulse, regular rhythm, normal first and second heart sounds, no murmurs, rubs or gallops Abdomen: no tenderness or distention, no masses by palpation, no abnormal pulsatility or arterial  bruits, normal bowel sounds, no hepatosplenomegaly Extremities: no clubbing, cyanosis or edema; 2+ radial, ulnar and brachial pulses bilaterally; 2+ right femoral, posterior tibial and dorsalis pedis pulses; 2+ left femoral, posterior tibial and dorsalis pedis pulses; no subclavian or femoral bruits Neurological: grossly nonfocal Psych: Normal mood and affect     Wt Readings from Last 3 Encounters:  11/03/22 97.2 kg  09/17/22 96.2 kg  09/09/21 99.3 kg      Studies/Labs Reviewed:   EKG:  EKG is ordered today.  Personally reviewed, shows sinus rhythm with a single PAC and first-degree AV block (210 ms), no ischemic repolarization abnormalities, QTc 397 ms  Recent Labs: 12/20/2019 Hemoglobin A1c 5.8%, TSH 0.28 12/01/2019 Creatinine 0.85, normal liver function tests   01/02/2021 Creatinine 1.13 02/28/2021 Potassium 3.9, normal liver function tests, TSH 2.25, hemoglobin A1c 5.9%  BMET    Component Value Date/Time   NA 140 03/18/2018 0457   NA 142 12/17/2014 1039   K 3.6 03/18/2018 0457   K 3.5 12/17/2014 1039   CL 103 03/18/2018 0457   CL 101 04/24/2013 0904   CO2 28 03/18/2018 0457   CO2 31 (H) 12/17/2014 1039   GLUCOSE 150 (H) 03/18/2018 0457   GLUCOSE 108 12/17/2014 1039   GLUCOSE 116 (H) 04/24/2013 0904   BUN 20 03/18/2018 0457   BUN 18.3 12/17/2014 1039   CREATININE 0.84 03/18/2018 0457   CREATININE 0.98 02/22/2015 0935   CREATININE 0.9 12/17/2014 1039   CALCIUM 9.1 03/18/2018 0457   CALCIUM 10.2 12/17/2014 1039   GFRNONAA >60 03/18/2018 0457   GFRAA >60 03/18/2018 0457   07/20/2019 Total cholesterol 174, HDL 59, LDL 93, triglycerides 112 Hemoglobin 12.8  08/23/2020 Cholesterol 175, HDL 66, LDL 90, triglycerides 97 ASSESSMENT:    1. SOB (shortness of breath)   2. Coronary artery disease involving native coronary artery of native heart without angina pectoris   3. Essential hypertension   4. Hypercholesterolemia   5. Statin myopathy   6. Prediabetes   7.  Severe obesity (BMI 35.0-35.9 with comorbidity) (HCC)      PLAN:  In order of problems listed above:  1.  Exertional dyspnea: As was the case last year it is difficult to decide whether her dyspnea is cardiac or pulmonary in etiology.  We will check a BNP and an echocardiogram.  I am sure obesity is contributory.  Does not have any physical findings to suggest volume overload.  She is not experiencing angina.  Possible pulmonary causes of dyspnea and the need to be kept in mind include history of chest radiation, ongoing treatment with methotrexate, obesity, history of sleep apnea with inconsistent use of CPAP.  Echocardiogram may show signs of right heart overload, then guide [place] to evaluate a pulmonary cause for her shortness of breath. 2. CAD: Denies angina at this time.  Has a known moderate stenosis in the LAD artery.  If we find regional wall motion abnormalities or overt reduction in LVEF, referred directly to invasive coronary angiography. 3. HLP: Now on PCSK9 inhibitor due to recurrent problems with statin myopathy even with intermittent dosed rosuvastatin.  Get labs from Dr. Waynard Edwards. 4. Diabetes: Earlier this year hemoglobin A1c was 6.1%.  She has normal renal function. 5. Obesity: Ideally would lose quite a bit of weight to help with her breathing and her metabolic abnormalities. 6. HTN: Well-controlled.   Medication Adjustments/Labs and Tests Ordered: Current medicines are reviewed at length with the patient today.  Concerns regarding medicines are outlined above.  Medication changes, Labs and Tests ordered today are listed in the Patient Instructions below. Patient Instructions  Medication Instructions:  Continue same medications *If you need a refill on your cardiac medications before your next appointment, please call your pharmacy*   Lab Work: Bnp this week Lab order enclosed   Testing/Procedures: Schedule Echo    Follow-Up: At [facility], you and your health  needs are our priority.  As part of our continuing mission to provide you with exceptional heart care, we have created designated Provider Care Teams.  These Care Teams include your primary Cardiologist (physician) and Advanced Practice Providers (APPs -  Physician Assistants and Nurse Practitioners) who all work together to provide you with the care you need, when you need it.  We recommend signing up for the patient portal called "MyChart".  Sign up information is provided on this After Visit Summary.  MyChart is used to connect with patients for Virtual Visits (Telemedicine).  Patients are able to view lab/test results, encounter notes, upcoming appointments, etc.  Non-urgent messages can be sent to your provider as well.   To learn more about what you can do with MyChart, go to [email or web address].    Your next appointment:  3 months    The format for your next appointment: Office   Provider:  Dr.Athira Janowicz   Important Information About Sugar         Signed, Thurmon Fair, MD  11/06/2022 4:36 PM    [facility] [address] [place], [place], [place]  [address] Phone: [phone]; Fax: [phone]

## 2022-11-03 NOTE — Patient Instructions (Signed)
Medication Instructions:  Continue same medications *If you need a refill on your cardiac medications before your next appointment, please call your pharmacy*   Lab Work: Bnp this week Lab order enclosed   Testing/Procedures: Schedule Echo    Follow-Up: At [facility], you and your health needs are our priority.  As part of our continuing mission to provide you with exceptional heart care, we have created designated Provider Care Teams.  These Care Teams include your primary Cardiologist (physician) and Advanced Practice Providers (APPs -  Physician Assistants and Nurse Practitioners) who all work together to provide you with the care you need, when you need it.  We recommend signing up for the patient portal called "MyChart".  Sign up information is provided on this After Visit Summary.  MyChart is used to connect with patients for Virtual Visits (Telemedicine).  Patients are able to view lab/test results, encounter notes, upcoming appointments, etc.  Non-urgent messages can be sent to your provider as well.   To learn more about what you can do with MyChart, go to [email or web address].    Your next appointment:  3 months    The format for your next appointment: Office   Provider:  Dr.Croitoru   Important Information About Sugar

## 2022-11-04 DIAGNOSIS — I10 Essential (primary) hypertension: Secondary | ICD-10-CM | POA: Diagnosis not present

## 2022-11-04 DIAGNOSIS — I2510 Atherosclerotic heart disease of native coronary artery without angina pectoris: Secondary | ICD-10-CM | POA: Diagnosis not present

## 2022-11-04 DIAGNOSIS — R0602 Shortness of breath: Secondary | ICD-10-CM | POA: Diagnosis not present

## 2022-11-06 LAB — BRAIN NATRIURETIC PEPTIDE: BNP: 79.5 pg/mL (ref 0.0–100.0)

## 2022-11-10 DIAGNOSIS — R700 Elevated erythrocyte sedimentation rate: Secondary | ICD-10-CM | POA: Diagnosis not present

## 2022-11-10 DIAGNOSIS — E785 Hyperlipidemia, unspecified: Secondary | ICD-10-CM | POA: Diagnosis not present

## 2022-11-10 DIAGNOSIS — G4733 Obstructive sleep apnea (adult) (pediatric): Secondary | ICD-10-CM | POA: Diagnosis not present

## 2022-11-10 DIAGNOSIS — M069 Rheumatoid arthritis, unspecified: Secondary | ICD-10-CM | POA: Diagnosis not present

## 2022-11-10 DIAGNOSIS — Z23 Encounter for immunization: Secondary | ICD-10-CM | POA: Diagnosis not present

## 2022-11-10 DIAGNOSIS — M1990 Unspecified osteoarthritis, unspecified site: Secondary | ICD-10-CM | POA: Diagnosis not present

## 2022-11-10 DIAGNOSIS — E1169 Type 2 diabetes mellitus with other specified complication: Secondary | ICD-10-CM | POA: Diagnosis not present

## 2022-11-10 DIAGNOSIS — I10 Essential (primary) hypertension: Secondary | ICD-10-CM | POA: Diagnosis not present

## 2022-11-10 DIAGNOSIS — Z0000 Encounter for general adult medical examination without abnormal findings: Secondary | ICD-10-CM | POA: Diagnosis not present

## 2022-11-10 DIAGNOSIS — I2510 Atherosclerotic heart disease of native coronary artery without angina pectoris: Secondary | ICD-10-CM | POA: Diagnosis not present

## 2022-11-10 DIAGNOSIS — G609 Hereditary and idiopathic neuropathy, unspecified: Secondary | ICD-10-CM | POA: Diagnosis not present

## 2022-11-10 DIAGNOSIS — M542 Cervicalgia: Secondary | ICD-10-CM | POA: Diagnosis not present

## 2022-11-17 ENCOUNTER — Inpatient Hospital Stay: Payer: PPO | Attending: Adult Health | Admitting: Genetic Counselor

## 2022-11-17 ENCOUNTER — Other Ambulatory Visit: Payer: Self-pay

## 2022-11-17 ENCOUNTER — Inpatient Hospital Stay: Payer: PPO

## 2022-11-17 ENCOUNTER — Other Ambulatory Visit: Payer: Self-pay | Admitting: Genetic Counselor

## 2022-11-17 ENCOUNTER — Encounter: Payer: Self-pay | Admitting: Genetic Counselor

## 2022-11-17 DIAGNOSIS — Z853 Personal history of malignant neoplasm of breast: Secondary | ICD-10-CM | POA: Diagnosis not present

## 2022-11-17 DIAGNOSIS — Z1379 Encounter for other screening for genetic and chromosomal anomalies: Secondary | ICD-10-CM

## 2022-11-17 DIAGNOSIS — Z803 Family history of malignant neoplasm of breast: Secondary | ICD-10-CM

## 2022-11-17 DIAGNOSIS — Z8041 Family history of malignant neoplasm of ovary: Secondary | ICD-10-CM

## 2022-11-17 LAB — GENETIC SCREENING ORDER

## 2022-11-17 NOTE — Progress Notes (Addendum)
REFERRING PROVIDER: Nicholas Lose, MD  PRIMARY PROVIDER:  [name], MD  PRIMARY REASON FOR VISIT:  Encounter Diagnoses  Name Primary?   History of right breast cancer Yes   Family history of breast cancer    Family history of ovarian cancer     HISTORY OF PRESENT ILLNESS:   [name], a 81 y.o. female, was seen for a [place] cancer genetics consultation at the request of Dr. Joylene Draft due to a personal and family history of cancer.  [name] presents to clinic today to discuss the possibility of a hereditary predisposition to cancer, to discuss genetic testing, and to further clarify her future cancer risks, as well as potential cancer risks for family members.   Ms. Dial has a personal history of right breast IDC diagnosed at age 72. She had negative BRCA1/2 genetic testing through [facility] in 2012.  CANCER HISTORY:  Oncology History  History of right breast cancer  12/02/2011 -  Neo-Adjuvant Chemotherapy   Taxotere and Cytoxan 4    04/20/2012 Initial Diagnosis   Right breast invasive ductal carcinoma with DCIS 2.5 cm with the MRI showing a maximum diameter of 7.5 cm ER/PR positive HER-2 negative Ki-67 15%    Surgery   Lumpectomy: IDC with lymphovascular invasion 1.9 cm, 2 sentinel lymph nodes negative    Radiation Therapy   Adjuvant radiation   07/21/2012 - 07/2019 Anti-estrogen oral therapy   Letrozole 2.5 mg daily       RISK FACTORS:  First live birth at age 7.  Ovaries intact: no.  Uterus intact: no.  Menopausal status: postmenopausal.  Colonoscopy: 2019 colonoscopy identified 6 tubular adenomas; 2013 colonoscopy identified 1 tubular adenoma Mammogram within the last year: yes.  Past Medical History:  Diagnosis Date   Anemia    history of   Arthritis    osteo - s/p right total knee   Asthma    daily and prn inhalers   Boil, axilla    Breast cancer ([place]) 10/28/11   right breast   Breast cancer of upper-outer quadrant of right female breast ([place])  11/04/2011   2.5 x 2.4 x 1.7 cm biopsy-proven invasive ductal carcinoma and ductal carcinoma in situ in the anterior aspect of the upper outer  quadrant of the right breast. There are associated linear and nodular enhancing masses extending from this mass to the  nipple/areolar complex with abnormal thickening and enhancement of the nipple and areola, compatible with extension of malignancy into  the nipple/ar   CAD (coronary artery disease)    Colon polyp    GERD (gastroesophageal reflux disease)    Grave's disease    no current meds.   H pylori ulcer    Heart murmur    History of blood transfusion 2006   History of chemotherapy    neoadjuvant: 5 cycles of Taxol/carboplatinum:  last dose 02/24/12   History of hiatal hernia    Hyperlipidemia    Hypertension    under control with med.   Hypothyroid    Liver cyst    History of    Pre-diabetes    RA (rheumatoid arthritis) ([place])    S/P chemotherapy, time since 4-12 weeks finished 02/20/2011   S/P radiation therapy    Right Breast High Axilla and Supraclavicular gegion: 4500 cGy/25 Fractions with boost for a total of 6300 cGy   Sleep apnea sleep study 09/18/2005   uses CPAP occ. - to bring machine DOS   Spondylosis    cervical and lumbar  Use of letrozole ([place]) start 07/21/12    Past Surgical History:  Procedure Laterality Date   ABDOMINAL HYSTERECTOMY     BILATERAL OOPHORECTOMY  2009   Boil removal under arm Right    BREAST BIOPSY Bilateral 1998   BREAST BIOPSY Right 08/11/2018   FIBROADENOMATOID NODULE WITH CALCIFICATIONS    BREAST LUMPECTOMY Right 03/2012   CARDIAC CATHETERIZATION  04/10/2010, 05/07/2004   LAD lesion, blood flow OK, per pt.   CARDIOVASCULAR STRESS TEST  02/15/2018   CARPAL TUNNEL RELEASE     left   CARPAL TUNNEL RELEASE Right    CATARACT EXTRACTION, BILATERAL     COLONOSCOPY  12/20/2012   Procedure: COLONOSCOPY;  Surgeon: Lafayette Dragon, MD;  Location: [facility];  Service: Endoscopy;  Laterality: N/A;    ESOPHAGOGASTRODUODENOSCOPY  12/20/2012   Procedure: ESOPHAGOGASTRODUODENOSCOPY (EGD);  Surgeon: Lafayette Dragon, MD;  Location: [facility];  Service: Endoscopy;  Laterality: N/A;   LESION REMOVAL  11/27/2011   Procedure: LESION REMOVAL;  Surgeon: Adin Hector, MD;  Location: [place];  Service: General;  Laterality: Right;  Skin tag removed from under right eye. No specimen   LUMBAR LAMINECTOMY     back surg. x 2 - 1980's and 1999   NM MYOVIEW LTD  08/02/2012   no ischemia   PARTIAL HYSTERECTOMY     BSO   PORTACATH PLACEMENT  11/27/2011   Procedure: INSERTION PORT-A-CATH;  Surgeon: Adin Hector, MD;  Location: [place];  Service: General;  Laterality: Right;   TOTAL HIP ARTHROPLASTY Right 03/17/2018   Procedure: RIGHT TOTAL HIP ARTHROPLASTY ANTERIOR APPROACH;  Surgeon: Rod Can, MD;  Location: [facility];  Service: Orthopedics;  Laterality: Right;  Needs RNFA   TOTAL KNEE ARTHROPLASTY  07/28/2006   right   US ECHOCARDIOGRAPHY  08/14/2008   technically difficult-mild LVH, EF =>55%,mild annular ca+,AOV mildly sclerotic    Social History   Socioeconomic History   Marital status: Single    Spouse name: Not on file   Number of children: Not on file   Years of education: Not on file   Highest education level: Not on file  Occupational History   Occupation: retired    Fish farm manager: RETIRED  Tobacco Use   Smoking status: Never   Smokeless tobacco: Never  Vaping Use   Vaping Use: Never used  Substance and Sexual Activity   Alcohol use: No    Alcohol/week: 0.0 standard drinks of alcohol   Drug use: No   Sexual activity: Not Currently    Birth control/protection: Surgical  Other Topics Concern   Not on file  Social History Narrative   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  Significant diagnoses are listed below: Family History  Problem Relation Age of Onset   Pancreatic cancer Paternal Uncle    Cancer Maternal Grandfather        unknown type   Cancer Paternal Grandfather        unknown type   Anesthesia problems Cousin        pt. states she is not aware of any family  anes. prob., does not know where this comment came from   Breast cancer Cousin    Ovarian cancer Cousin 26 - 62   Ovarian cancer Cousin    Ovarian cancer Niece 26   Colon cancer Neg Hx    Esophageal cancer Neg Hx    Rectal cancer Neg Hx    Stomach cancer Neg Hx        Ms. Lyerly's niece was diagnosed with ovarian cancer at age 10. Ms. Ace has five paternal uncles and one was diagnosed with pancreatic cancer at an unknown age, he is deceased. This paternal uncle has four daughters and one has a history of breast cancer and a second has a history of ovarian cancer. A different paternal cousin (daughter of a paternal aunt) was diagnosed with ovarian cancer. Her paternal grandfather was diagnosed with an unknown type of cancer, he is deceased. Ms. Mcmannis maternal grandfather was diagnosed with an unknown type of cancer, he is deceased. She is unaware of previous family history of genetic testing for hereditary cancer risks. Ms. Oestreicher reports her paternal and maternal grandfathers are first cousins.  GENETIC COUNSELING ASSESSMENT: Ms. Kump is a 81 y.o. female with a personal and family history of cancer which is somewhat suggestive of a hereditary predisposition to cancer given multiple generations affected with breast and ovarian cancer. We, therefore, discussed and recommended the following at today's visit.   DISCUSSION: We discussed that 5 - 10% of cancer is hereditary, with most cases of breast cancer associated with BRCA1/2.  There  are other genes that can be associated with hereditary breast cancer syndromes.  We discussed that testing is beneficial for several reasons including knowing how to follow individuals after completing their treatment and understanding if other family members could be at an increased risk for cancer.  We reviewed the characteristics, features and inheritance patterns of hereditary cancer syndromes. We also discussed genetic testing, including the appropriate family members to test, the process of testing, insurance coverage and turn-around-time for results. We discussed the implications of a negative, positive, carrier and/or variant of uncertain significant result.   Ms. Karrer  was offered a common hereditary cancer panel (48 genes) and an expanded pan-cancer panel (77 genes). Ms. Libman was informed of the benefits and limitations of each panel, including that expanded pan-cancer panels contain genes that do not have clear management guidelines at this point in time.  We also discussed that as the number of genes included on a panel increases, the chances of variants of uncertain significance increases. After considering the benefits and limitations of each gene panel, Ms. Zumwalt elected to have [facility] Common Cancer Panel.  The Common Hereditary Cancers Panel offered by [facility] includes sequencing and/or deletion duplication testing of the following 48 genes: APC, ATM, AXIN2, BAP1, BARD1, BMPR1A, BRCA1, BRCA2, BRIP1, CDH1, CDK4, CDKN2A (p14ARF and p16INK4a only), CHEK2, CTNNA1, DICER1, EPCAM (Deletion/duplication testing only), FH, GREM1 (promoter region duplication testing only), HOXB13, KIT, MBD4, MEN1, MLH1, MSH2, MSH3, MSH6, MUTYH, NF1, NHTL1, PALB2, PDGFRA, PMS2, POLD1, POLE, PTEN, RAD51C, RAD51D, SDHA (sequencing analysis only except exon 14), SDHB, SDHC, SDHD, SMAD4, SMARCA4. STK11, TP53, TSC1, TSC2, and VHL.   Based on Ms. Loy's personal and family history of cancer, she meets medical criteria for  genetic testing. Despite that she meets criteria, she may still have an out of pocket cost. We discussed that if her out of pocket cost for testing is over $100, the laboratory will call and confirm whether she wants to  proceed with testing.  If the out of pocket cost of testing is less than $100 she will be billed by the genetic testing laboratory.   PLAN: After considering the risks, benefits, and limitations, Ms. Trickey provided informed consent to pursue genetic testing and the blood sample was sent to [facility] for analysis of the Common Cancer Panel. Results should be available within approximately 2-3 weeks' time, at which point they will be disclosed by telephone to Ms. Hopfer, as will any additional recommendations warranted by these results. Ms. Lizaola will receive a summary of her genetic counseling visit and a copy of her results once available. This information will also be available in Epic.   Ms. Azua questions were answered to her satisfaction today. Our contact information was provided should additional questions or concerns arise. Thank you for the referral and allowing [place] to share in the care of your patient.   Lucille Passy, MS, [facility] Genetic Counselor [email or web address] (P) [phone]  The patient was seen for a total of 40 minutes in face-to-face genetic counseling. The patient was seen alone.  Drs. Lindi Adie and/or Burr Medico were available to discuss this case as needed.   _______________________________________________________________________ For Office Staff:  Number of people involved in session: 1 Was an Intern/ student involved with case: no

## 2022-11-23 ENCOUNTER — Other Ambulatory Visit (HOSPITAL_BASED_OUTPATIENT_CLINIC_OR_DEPARTMENT_OTHER): Payer: Self-pay

## 2022-11-23 ENCOUNTER — Ambulatory Visit (HOSPITAL_COMMUNITY): Payer: PPO | Attending: Cardiology

## 2022-11-23 DIAGNOSIS — I10 Essential (primary) hypertension: Secondary | ICD-10-CM | POA: Insufficient documentation

## 2022-11-23 DIAGNOSIS — R0602 Shortness of breath: Secondary | ICD-10-CM | POA: Insufficient documentation

## 2022-11-23 DIAGNOSIS — I2510 Atherosclerotic heart disease of native coronary artery without angina pectoris: Secondary | ICD-10-CM | POA: Insufficient documentation

## 2022-11-23 LAB — ECHOCARDIOGRAM COMPLETE
Area-P 1/2: 3.4 cm2
S' Lateral: 3 cm

## 2022-11-23 MED ORDER — PERFLUTREN LIPID MICROSPHERE
1.0000 mL | INTRAVENOUS | Status: AC | PRN
  Administered 2022-11-23: 1 mL via INTRAVENOUS

## 2022-11-23 MED ORDER — AREXVY 120 MCG/0.5ML IM SUSR
INTRAMUSCULAR | 0 refills | Status: DC
  Filled 2022-11-23: qty 0.5, 1d supply, fill #0

## 2022-11-24 DIAGNOSIS — I10 Essential (primary) hypertension: Secondary | ICD-10-CM | POA: Diagnosis not present

## 2022-11-24 DIAGNOSIS — M1990 Unspecified osteoarthritis, unspecified site: Secondary | ICD-10-CM | POA: Diagnosis not present

## 2022-11-24 DIAGNOSIS — E669 Obesity, unspecified: Secondary | ICD-10-CM | POA: Diagnosis not present

## 2022-11-24 DIAGNOSIS — M25462 Effusion, left knee: Secondary | ICD-10-CM | POA: Diagnosis not present

## 2022-11-24 DIAGNOSIS — M65312 Trigger thumb, left thumb: Secondary | ICD-10-CM | POA: Diagnosis not present

## 2022-11-24 DIAGNOSIS — J449 Chronic obstructive pulmonary disease, unspecified: Secondary | ICD-10-CM | POA: Diagnosis not present

## 2022-11-24 DIAGNOSIS — E785 Hyperlipidemia, unspecified: Secondary | ICD-10-CM | POA: Diagnosis not present

## 2022-11-24 DIAGNOSIS — Z79899 Other long term (current) drug therapy: Secondary | ICD-10-CM | POA: Diagnosis not present

## 2022-11-24 DIAGNOSIS — M0609 Rheumatoid arthritis without rheumatoid factor, multiple sites: Secondary | ICD-10-CM | POA: Diagnosis not present

## 2022-11-27 ENCOUNTER — Telehealth: Payer: Self-pay | Admitting: Genetic Counselor

## 2022-11-27 ENCOUNTER — Encounter: Payer: Self-pay | Admitting: Genetic Counselor

## 2022-11-27 DIAGNOSIS — Z1379 Encounter for other screening for genetic and chromosomal anomalies: Secondary | ICD-10-CM | POA: Insufficient documentation

## 2022-11-27 NOTE — Telephone Encounter (Signed)
I contacted Ms. Roupp to discuss her genetic testing results. No pathogenic variants were identified in the 48 genes analyzed. Of note, a variant of uncertain significance was identified in the MLH1 gene. Detailed clinic note to follow.  The test report has been scanned into EPIC and is located under the Molecular Pathology section of the Results Review tab.  A portion of the result report is included below for reference.   Lucille Passy, MS, [facility] Genetic Counselor [email or web address] (P) [phone]

## 2022-11-30 ENCOUNTER — Ambulatory Visit: Payer: Self-pay | Admitting: Genetic Counselor

## 2022-11-30 ENCOUNTER — Encounter: Payer: Self-pay | Admitting: Genetic Counselor

## 2022-11-30 DIAGNOSIS — Z1379 Encounter for other screening for genetic and chromosomal anomalies: Secondary | ICD-10-CM

## 2022-11-30 NOTE — Progress Notes (Signed)
HPI:   [name] was previously seen in the [place] clinic due to a personal and family history of cancer and concerns regarding a hereditary predisposition to cancer. Please refer to our prior cancer genetics clinic note for more information regarding our discussion, assessment and recommendations, at the time. [name] recent genetic test results were disclosed to her, as were recommendations warranted by these results. These results and recommendations are discussed in more detail below.  CANCER HISTORY:  Oncology History  History of right breast cancer  12/02/2011 -  Neo-Adjuvant Chemotherapy   Taxotere and Cytoxan 4    04/20/2012 Initial Diagnosis   Right breast invasive ductal carcinoma with DCIS 2.5 cm with the MRI showing a maximum diameter of 7.5 cm ER/PR positive HER-2 negative Ki-67 15%    Surgery   Lumpectomy: IDC with lymphovascular invasion 1.9 cm, 2 sentinel lymph nodes negative    Radiation Therapy   Adjuvant radiation   07/21/2012 - 07/2019 Anti-estrogen oral therapy   Letrozole 2.5 mg daily      FAMILY HISTORY:  We obtained a detailed, 4-generation family history.  Significant diagnoses are listed below:      Family History  Problem Relation Age of Onset   Pancreatic cancer Paternal Uncle     Cancer Maternal Grandfather          unknown type   Cancer Paternal Grandfather          unknown type   Anesthesia problems Cousin          pt. states she is not aware of any family anes. prob., does not know where this comment came from   Breast cancer Cousin     Ovarian cancer Cousin 55 - 23   Ovarian cancer Cousin     Ovarian cancer Niece 83   Colon cancer Neg Hx     Esophageal cancer Neg Hx     Rectal cancer Neg Hx     Stomach cancer Neg Hx             [name]'s niece was diagnosed with ovarian cancer at age 14. [name] has five paternal uncles and one was diagnosed with pancreatic cancer at an unknown age, he is deceased. This paternal  uncle has four daughters and one has a history of breast cancer and a second has a history of ovarian cancer. A different paternal cousin (daughter of a paternal aunt) was diagnosed with ovarian cancer. Her paternal grandfather was diagnosed with an unknown type of cancer, he is deceased. [name] maternal grandfather was diagnosed with an unknown type of cancer, he is deceased. She is unaware of previous family history of genetic testing for hereditary cancer risks. [name] reports her paternal and maternal grandfathers are first cousins.  GENETIC TEST RESULTS:  The [facility] Common Cancer Panel found no pathogenic mutations.  The Common Hereditary Cancers Panel offered by [facility] includes sequencing and/or deletion duplication testing of the following 48 genes: APC, ATM, AXIN2, BAP1, BARD1, BMPR1A, BRCA1, BRCA2, BRIP1, CDH1, CDK4, CDKN2A (p14ARF and p16INK4a only), CHEK2, CTNNA1, DICER1, EPCAM (Deletion/duplication testing only), FH, GREM1 (promoter region duplication testing only), HOXB13, KIT, MBD4, MEN1, MLH1, MSH2, MSH3, MSH6, MUTYH, NF1, NHTL1, PALB2, PDGFRA, PMS2, POLD1, POLE, PTEN, RAD51C, RAD51D, SDHA (sequencing analysis only except exon 14), SDHB, SDHC, SDHD, SMAD4, SMARCA4. STK11, TP53, TSC1, TSC2, and VHL.  The test report has been scanned into EPIC and is located under the Molecular Pathology section of the Results Review  tab.  A portion of the result report is included below for reference. Genetic testing reported out on 11/24/2022.     Genetic testing identified a variant of uncertain significance (VUS) in the MLH1 gene called [id number].  At this time, it is unknown if this variant is associated with an increased risk for cancer or if it is benign, but most uncertain variants are reclassified to benign. It should not be used to make medical management decisions. With time, we suspect the laboratory will determine the significance of this variant, if any. If the laboratory reclassifies  this variant, we will attempt to contact [name] to discuss it further.   Even though a pathogenic variant was not identified, possible explanations for the cancer in the family may include: There may be no hereditary risk for cancer in the family. The cancers in [name] and/or her family may be due to other genetic or environmental factors. There may be a gene mutation in one of these genes that current testing methods cannot detect, but that chance is small. There could be another gene that has not yet been discovered, or that we have not yet tested, that is responsible for the cancer diagnoses in the family.  It is also possible there is a hereditary cause for the cancer in the family that [name] did not inherit. The variant of uncertain significance detected in the MLH1 gene may be reclassified as a pathogenic variant in the future. At this time, we do not know if this variant increases the risk for cancer.  Therefore, it is important to remain in touch with cancer genetics in the future so that we can continue to offer [name] the most up to date genetic testing.   ADDITIONAL GENETIC TESTING:  We discussed with Ms. Fuerst that her genetic testing was fairly extensive.  If there are genes identified to increase cancer risk that can be analyzed in the future, we would be happy to discuss and coordinate this testing at that time.    CANCER SCREENING RECOMMENDATIONS:  Ms. Hernandez test result is considered negative (normal).  This means that we have not identified a hereditary cause for her personal and family history of cancer at this time.   An individual's cancer risk and medical management are not determined by genetic test results alone. Overall cancer risk assessment incorporates additional factors, including personal medical history, family history, and any available genetic information that may result in a personalized plan for cancer prevention and surveillance. Therefore, it is  recommended she continue to follow the cancer management and screening guidelines provided by her oncology and primary healthcare provider.  RECOMMENDATIONS FOR FAMILY MEMBERS:   Individuals in this family might be at some increased risk of developing cancer, over the general population risk, due to the family history of cancer. We recommend women in this family have a yearly mammogram beginning at age 64, or 53 years younger than the earliest onset of cancer, an annual clinical breast exam, and perform monthly breast self-exams.  Other members of the family may still carry a pathogenic variant in one of these genes that Ms. Rasmusson did not inherit. Based on the family history, we recommend individuals in the family with a history of cancer have genetic counseling and testing.  We do not recommend familial testing for the MLH1 variant of uncertain significance (VUS).  FOLLOW-UP:  Cancer genetics is a rapidly advancing field and it is possible that new genetic tests will be appropriate for  her and/or her family members in the future. We encouraged her to remain in contact with cancer genetics on an annual basis so we can update her personal and family histories and let her know of advances in cancer genetics that may benefit this family.   Our contact number was provided. Ms. Pangle questions were answered to her satisfaction, and she knows she is welcome to call us at anytime with additional questions or concerns.   Jasmine Passy, MS, [facility] Genetic Counselor [email or web address] (P) [phone]

## 2022-12-01 DIAGNOSIS — Z780 Asymptomatic menopausal state: Secondary | ICD-10-CM | POA: Diagnosis not present

## 2023-01-15 ENCOUNTER — Other Ambulatory Visit: Payer: Self-pay | Admitting: Cardiovascular Disease

## 2023-01-25 ENCOUNTER — Ambulatory Visit: Payer: PPO | Admitting: Cardiovascular Disease

## 2023-01-25 ENCOUNTER — Other Ambulatory Visit: Payer: Self-pay | Admitting: Cardiovascular Disease

## 2023-02-03 ENCOUNTER — Other Ambulatory Visit: Payer: Self-pay | Admitting: Cardiovascular Disease

## 2023-02-11 ENCOUNTER — Other Ambulatory Visit: Payer: Self-pay | Admitting: Cardiovascular Disease

## 2023-04-19 DIAGNOSIS — M25512 Pain in left shoulder: Secondary | ICD-10-CM | POA: Diagnosis not present

## 2023-04-19 DIAGNOSIS — M25562 Pain in left knee: Secondary | ICD-10-CM | POA: Diagnosis not present

## 2023-04-19 DIAGNOSIS — I10 Essential (primary) hypertension: Secondary | ICD-10-CM | POA: Diagnosis not present

## 2023-04-19 DIAGNOSIS — M79642 Pain in left hand: Secondary | ICD-10-CM | POA: Diagnosis not present

## 2023-04-19 DIAGNOSIS — M0609 Rheumatoid arthritis without rheumatoid factor, multiple sites: Secondary | ICD-10-CM | POA: Diagnosis not present

## 2023-04-19 DIAGNOSIS — M25511 Pain in right shoulder: Secondary | ICD-10-CM | POA: Diagnosis not present

## 2023-04-19 DIAGNOSIS — Z79899 Other long term (current) drug therapy: Secondary | ICD-10-CM | POA: Diagnosis not present

## 2023-04-19 DIAGNOSIS — M1990 Unspecified osteoarthritis, unspecified site: Secondary | ICD-10-CM | POA: Diagnosis not present

## 2023-04-19 DIAGNOSIS — M79641 Pain in right hand: Secondary | ICD-10-CM | POA: Diagnosis not present

## 2023-04-19 DIAGNOSIS — J449 Chronic obstructive pulmonary disease, unspecified: Secondary | ICD-10-CM | POA: Diagnosis not present

## 2023-04-19 DIAGNOSIS — E785 Hyperlipidemia, unspecified: Secondary | ICD-10-CM | POA: Diagnosis not present

## 2023-04-19 DIAGNOSIS — E669 Obesity, unspecified: Secondary | ICD-10-CM | POA: Diagnosis not present

## 2023-05-14 ENCOUNTER — Other Ambulatory Visit: Payer: Self-pay | Admitting: Internal Medicine

## 2023-05-14 DIAGNOSIS — M4802 Spinal stenosis, cervical region: Secondary | ICD-10-CM

## 2023-05-24 ENCOUNTER — Ambulatory Visit
Admission: RE | Admit: 2023-05-24 | Discharge: 2023-05-24 | Disposition: A | Discharge status: Home / Self Care | Payer: PPO | Source: Ambulatory Visit | Attending: Internal Medicine | Admitting: Internal Medicine

## 2023-05-24 DIAGNOSIS — M4802 Spinal stenosis, cervical region: Secondary | ICD-10-CM | POA: Diagnosis not present

## 2023-05-24 MED ORDER — IOPAMIDOL (ISOVUE-M 300) INJECTION 61%
1.0000 mL | Freq: Once | INTRAMUSCULAR | Status: AC | PRN
  Administered 2023-05-24: 1 mL via EPIDURAL

## 2023-05-24 MED ORDER — TRIAMCINOLONE ACETONIDE 40 MG/ML IJ SUSP (RADIOLOGY)
60.0000 mg | Freq: Once | INTRAMUSCULAR | Status: AC
  Administered 2023-05-24: 60 mg via EPIDURAL

## 2023-05-24 NOTE — Discharge Instructions (Signed)

## 2023-06-08 ENCOUNTER — Other Ambulatory Visit: Payer: Self-pay | Admitting: Obstetrics and Gynecology

## 2023-06-08 DIAGNOSIS — Z1231 Encounter for screening mammogram for malignant neoplasm of breast: Secondary | ICD-10-CM

## 2023-06-11 ENCOUNTER — Ambulatory Visit
Admission: RE | Admit: 2023-06-11 | Discharge: 2023-06-11 | Disposition: A | Discharge status: Home / Self Care | Payer: PPO | Source: Ambulatory Visit | Attending: Obstetrics and Gynecology | Admitting: Obstetrics and Gynecology

## 2023-06-11 DIAGNOSIS — Z1231 Encounter for screening mammogram for malignant neoplasm of breast: Secondary | ICD-10-CM

## 2023-07-12 ENCOUNTER — Ambulatory Visit: Payer: PPO | Admitting: Cardiovascular Disease

## 2023-07-14 ENCOUNTER — Ambulatory Visit: Payer: PPO | Admitting: Physician Assistant

## 2023-07-14 NOTE — Progress Notes (Unsigned)
Cardiology Office Note:  .   Date:  07/15/2023  ID:  [name], DOB 01-24-1941, MRN [id number] PCP: Rodrigo Ran, MD  [facility] Providers Cardiologist:  Thurmon Fair, MD    History of Present Illness: [name] Kitchen   [name] is a 82 y.o. female with past medical history of moderate coronary artery disease, hypertension, hyperlipidemia, severe obesity, reactive airway disease, breast cancer, rheumatoid arthritis, right total hip replacement and treated hypothyroidism.   Coronary CT angiogram in 2011 indicated coronary atherosclerosis, followed by LHC in 2011 that showed moderate stenosis in the LAD that was not hemodynamically significant by pressure wire analysis. She had low risk nuclear stress tests in 2016, 2019 and 2021, the study in 2021 did indicate a small and mild reversible apical septal defect but was noted to be hard to interpret due to severe extracardiac tracer uptake.   She last saw Dr. Royann Shivers in 10/2022 presenting with complaints of reduced exercise tolerance that felt different from her usual asthma symptoms. She noted an occasional wheezing, denied coughing, fever or sputum production. Per Dr. Royann Shivers she had similar complaints the year prior with weather changes, symptoms improved with bronchodilators. It was felt that her exertional dyspnea was multifactorial given obesity, history of chest radiation, treatment with methotrexate for RA, history of sleep apnea with inconsistent use of CPAP. BNP at that time was normal. Echocardiogram in 11/2022 indicated EF of 60 to 65%, LV with normal function and no RWMA, G1DD, trivial mitral valve regurgitation.   Today she reports she is doing well but does not ongoing intermittent dyspnea on exertion. Notes similar complaints as noted on her last visit with Dr. Royann Shivers, it was recommended that time she follow-up with pulmonology.  Reports she has still not followed up with pulmonology but is planning to.  She feels this  is likely related to her asthma, noted increased nasal congestion and frequent upper respiratory infections in the spring and early summer.  She denies current coughing, fever or sputum production.  She does report an occasional wheezing at night.  Reports she has not used her CPAP in a significant amount of time.  She denies angina, palpitations, orthopnea, PND or lower extremity fatigue.  She did report some intermittent leg cramps few weeks ago, she stopped Zetia with improvement in symptoms, plans to follow-up with her PCP regarding this next week.  ROS: Today she denies chest pain, lower extremity edema, fatigue, palpitations, melena, hematuria, hemoptysis, diaphoresis, weakness, presyncope, syncope, orthopnea, and PND.   Studies Reviewed: .       Cardiac Studies & Procedures     STRESS TESTS  MYOCARDIAL PERFUSION IMAGING 02/20/2020  Narrative  The left ventricular ejection fraction is normal (55-65%).  Nuclear stress EF: 65%.  There is a small defect of mild severity present in the apical septal and apex location. The defect is reversible. This is a poor quality study with severe extracardiac tracer uptake in the liver and gallbladder. Possible ischemia in the apical septal and apical walls.  This is a low risk study.   ECHOCARDIOGRAM  ECHOCARDIOGRAM COMPLETE 11/23/2022  Narrative ECHOCARDIOGRAM REPORT    Patient Name:   [name] Date of Exam: 11/23/2022 Medical Rec #:  [id number]           Height:       65.0 in Accession #:    [id number]          Weight:       214.2 lb Date of Birth:  15-Dec-1941            BSA:          2.037 m Patient Age:    81 years            BP:           146/63 mmHg Patient Gender: F                   HR:           55 bpm. Exam Location:  [address]  Procedure: 2D Echo, Cardiac Doppler, Color Doppler and Intracardiac Opacification Agent  Indications:    R06.00 Dyspnea  History:        Patient has prior history of Echocardiogram  examinations, most recent 09/15/2021. CAD; Risk Factors:Hypertension and HLD.  Sonographer:    Clearence Ped RCS Referring Phys: [phone] MIHAI CROITORU  IMPRESSIONS   1. Left ventricular ejection fraction, by estimation, is 60 to 65%. The left ventricle has normal function. The left ventricle has no regional wall motion abnormalities. Left ventricular diastolic parameters are consistent with Grade I diastolic dysfunction (impaired relaxation). 2. Right ventricular systolic function is normal. The right ventricular size is normal. There is normal pulmonary artery systolic pressure. 3. The mitral valve is grossly normal. Trivial mitral valve regurgitation. No evidence of mitral stenosis. 4. The aortic valve is tricuspid. Aortic valve regurgitation is not visualized. No aortic stenosis is present. 5. The inferior vena cava is normal in size with greater than 50% respiratory variability, suggesting right atrial pressure of 3 mmHg. 6. Frequent PACs throughout study.  Comparison(s): Compared to prior TTE on 08/2021, there is no significant change.  FINDINGS Left Ventricle: Left ventricular ejection fraction, by estimation, is 60 to 65%. The left ventricle has normal function. The left ventricle has no regional wall motion abnormalities. The left ventricular internal cavity size was normal in size. There is no left ventricular hypertrophy. Left ventricular diastolic parameters are consistent with Grade I diastolic dysfunction (impaired relaxation).  Right Ventricle: The right ventricular size is normal. No increase in right ventricular wall thickness. Right ventricular systolic function is normal. There is normal pulmonary artery systolic pressure. The tricuspid regurgitant velocity is 2.46 m/s, and with an assumed right atrial pressure of 3 mmHg, the estimated right ventricular systolic pressure is 27.2 mmHg.  Left Atrium: Left atrial size was normal in size.  Right Atrium: Right atrial size was  normal in size.  Pericardium: There is no evidence of pericardial effusion.  Mitral Valve: The mitral valve is grossly normal. There is mild thickening of the mitral valve leaflet(s). Trivial mitral valve regurgitation. No evidence of mitral valve stenosis.  Tricuspid Valve: The tricuspid valve is normal in structure. Tricuspid valve regurgitation is trivial.  Aortic Valve: The aortic valve is tricuspid. Aortic valve regurgitation is not visualized. No aortic stenosis is present.  Pulmonic Valve: The pulmonic valve was not well visualized. Pulmonic valve regurgitation is trivial.  Aorta: The aortic root is normal in size and structure.  Venous: The inferior vena cava is normal in size with greater than 50% respiratory variability, suggesting right atrial pressure of 3 mmHg.  IAS/Shunts: The atrial septum is grossly normal.   LEFT VENTRICLE PLAX 2D LVIDd:         4.80 cm   Diastology LVIDs:         3.00 cm   LV e' medial:    10.20 cm/s LV PW:  0.90 cm   LV E/e' medial:  9.2 LV IVS:        0.90 cm   LV e' lateral:   11.90 cm/s LVOT diam:     1.80 cm   LV E/e' lateral: 7.9 LV SV:         53 LV SV Index:   26 LVOT Area:     2.54 cm   RIGHT VENTRICLE RV Basal diam:  3.40 cm RV S prime:     13.30 cm/s RVSP:           27.2 mmHg  LEFT ATRIUM             Index        RIGHT ATRIUM           Index LA diam:        3.70 cm 1.82 cm/m   RA Pressure: 3.00 mmHg LA Vol (A2C):   43.7 ml 21.46 ml/m  RA Area:     12.10 cm LA Vol (A4C):   40.8 ml 20.03 ml/m  RA Volume:   27.10 ml  13.31 ml/m LA Biplane Vol: 43.7 ml 21.46 ml/m AORTIC VALVE LVOT Vmax:   93.00 cm/s LVOT Vmean:  58.300 cm/s LVOT VTI:    0.210 m  AORTA Ao Root diam: 3.40 cm Ao Asc diam:  2.40 cm  MITRAL VALVE                TRICUSPID VALVE MV Area (PHT):              TR Peak grad:   24.2 mmHg MV Decel Time:              TR Vmax:        246.00 cm/s MV E velocity: 93.80 cm/s   Estimated RAP:  3.00 mmHg MV A  velocity: 101.00 cm/s  RVSP:           27.2 mmHg MV E/A ratio:  0.93 SHUNTS Systemic VTI:  0.21 m Systemic Diam: 1.80 cm  Laurance Flatten MD Electronically signed by Laurance Flatten MD Signature Date/Time: 11/23/2022/1:49:01 PM    Final     CT SCANS  CT CORONARY MORPH W/CTA COR W/SCORE 02/21/2010  Narrative OVER-READ INTERPRETATION - CT CHEST  The following report is an over-read performed by radiologist Dr. Lorenda Ishihara. Molli Posey, M.D. of [facility], [place] on 02/21/2010 5:13:17 PM.  This over-read does not include interpretation of cardiac or coronary anatomy or pathology.  The CTA interpretation by the cardiologist is attached.  Comparison:  None.  Findings: The visualized portions of the lung parenchyma are unremarkable.  There is no evidence for lymphadenopathy within the imaged portions of the mediastinum or either hilum.  Pulmonary arterial anatomy is unremarkable.  IMPRESSION: Unremarkable appearance of the extracardiac structures of the chest.  Provider: Arnetha Courser           Physical Exam:   VS:  BP 128/82 (BP Location: Left Arm, Patient Position: Sitting, Cuff Size: Normal)   Pulse 79   Ht 5\' 5"  (1.651 m)   Wt 211 lb 12.8 oz (96.1 kg)   SpO2 98%   BMI 35.25 kg/m    Wt Readings from Last 3 Encounters:  07/15/23 211 lb 12.8 oz (96.1 kg)  11/03/22 214 lb 3.2 oz (97.2 kg)  09/17/22 212 lb 1.6 oz (96.2 kg)    GEN: Well nourished, obese, well developed in no acute distress NECK: No JVD; No carotid bruits CARDIAC: RRR, no murmurs,  rubs, gallops RESPIRATORY:  Clear to auscultation without rales, wheezing or rhonchi  ABDOMEN: Soft, non-tender, non-distended EXTREMITIES:  No edema; No deformity   ASSESSMENT AND PLAN: .    CAD: LHC in 2011 that showed moderate stenosis in the LAD that was not hemodynamically significant by pressure wire analysis. She had low risk nuclear stress tests in 2016, 2019 and 2021, the study in 2021 did indicate a small and mild  reversible apical septal defect but was noted to be hard to interpret due to severe extracardiac tracer uptake. In November she was reporting ongoing DOE, echocardiogram showed EF 60 to 65% with no RWMA, G1DD, her BNP at that time was normal. It was recommended she follow up with Pulmonology, see below. Today she denies angina, notes ongoing DOE as previously noted by Dr. Royann Shivers. No indication for ischemic evaluation.  Continue aspirin, Repatha, and triamterene-hydrochlorothiazide.    Exertional dyspnea/OSA: In November she was reporting ongoing DOE, echocardiogram showed EF 60 to 65% with no RWMA, G1DD, her BNP at that time was normal. It was recommended she follow up with Pulmonology. Today she notes her dyspnea with exertion is ongoing, feels this is possibly related to her asthma but has not followed up with Pulmonology. Noted increased nasal congestion and frequent upper respiratory infections in the spring and early summer that worsened her DOE. She appears euvolemic and well compensated on exam today. Given similar symptoms to November, echo showing EF 60 to 65%, normal BNP at that time, no angina and appearing euvolemic on exam, do not feel further testing is needed at this time. She also denies CPAP use, given history of OSA discussed need for compliance. She plans to follow up with her pulmonologist and start utilizing her CPAP.   HTN: Blood pressure well controlled today at 128/82. Continue triamterene-hydrochlorothiazide.  HLD: Monitored and managed by Dr. Waynard Edwards. Last LDL was 48 in 10/2022. She notes she stopped ezetimibe recently in setting of leg cramps, with resolution of cramping. She has follow up with Dr. Waynard Edwards within the next two weeks to further discuss. Continue Repatha.   Diabetes: Monitored and managed by Dr. Waynard Edwards. Last hemoglobin A1C was 6.0% in 10/2022.        Dispo: Follow up in 6 months or sooner if needed.  Signed, Rip Harbour, NP

## 2023-07-15 ENCOUNTER — Ambulatory Visit: Payer: PPO | Admitting: Cardiology

## 2023-07-15 ENCOUNTER — Encounter: Payer: Self-pay | Admitting: Nurse Practitioner

## 2023-07-15 VITALS — BP 128/82 | HR 79 | Ht 65.0 in | Wt 211.8 lb

## 2023-07-15 DIAGNOSIS — E782 Mixed hyperlipidemia: Secondary | ICD-10-CM

## 2023-07-15 DIAGNOSIS — R7303 Prediabetes: Secondary | ICD-10-CM | POA: Diagnosis not present

## 2023-07-15 DIAGNOSIS — I2510 Atherosclerotic heart disease of native coronary artery without angina pectoris: Secondary | ICD-10-CM | POA: Diagnosis not present

## 2023-07-15 DIAGNOSIS — I10 Essential (primary) hypertension: Secondary | ICD-10-CM | POA: Diagnosis not present

## 2023-07-15 DIAGNOSIS — R0602 Shortness of breath: Secondary | ICD-10-CM

## 2023-07-15 NOTE — Patient Instructions (Signed)
Medication Instructions:  Your physician recommends that you continue on your current medications as directed. Please refer to the Current Medication list given to you today.  *If you need a refill on your cardiac medications before your next appointment, please call your pharmacy*   Lab Work: None  If you have labs (blood work) drawn today and your tests are completely normal, you will receive your results only by: MyChart Message (if you have MyChart) OR A paper copy in the mail If you have any lab test that is abnormal or we need to change your treatment, we will call you to review the results.   Testing/Procedures: None   Follow-Up: At [facility], you and your health needs are our priority.  As part of our continuing mission to provide you with exceptional heart care, we have created designated Provider Care Teams.  These Care Teams include your primary Cardiologist (physician) and Advanced Practice Providers (APPs -  Physician Assistants and Nurse Practitioners) who all work together to provide you with the care you need, when you need it.  We recommend signing up for the patient portal called "MyChart".  Sign up information is provided on this After Visit Summary.  MyChart is used to connect with patients for Virtual Visits (Telemedicine).  Patients are able to view lab/test results, encounter notes, upcoming appointments, etc.  Non-urgent messages can be sent to your provider as well.   To learn more about what you can do with MyChart, go to [email or web address].    Your next appointment:   6 month(s)  Provider:   Thurmon Fair, MD

## 2023-07-19 ENCOUNTER — Ambulatory Visit (INDEPENDENT_AMBULATORY_CARE_PROVIDER_SITE_OTHER): Payer: PPO | Admitting: Adult Health

## 2023-07-19 ENCOUNTER — Ambulatory Visit (INDEPENDENT_AMBULATORY_CARE_PROVIDER_SITE_OTHER): Payer: PPO

## 2023-07-19 ENCOUNTER — Encounter: Payer: Self-pay | Admitting: Adult Health

## 2023-07-19 VITALS — BP 140/82 | HR 84 | Ht 64.0 in | Wt 210.0 lb

## 2023-07-19 DIAGNOSIS — M0609 Rheumatoid arthritis without rheumatoid factor, multiple sites: Secondary | ICD-10-CM | POA: Diagnosis not present

## 2023-07-19 DIAGNOSIS — I10 Essential (primary) hypertension: Secondary | ICD-10-CM | POA: Diagnosis not present

## 2023-07-19 DIAGNOSIS — E785 Hyperlipidemia, unspecified: Secondary | ICD-10-CM | POA: Diagnosis not present

## 2023-07-19 DIAGNOSIS — R252 Cramp and spasm: Secondary | ICD-10-CM | POA: Diagnosis not present

## 2023-07-19 DIAGNOSIS — J4530 Mild persistent asthma, uncomplicated: Secondary | ICD-10-CM

## 2023-07-19 DIAGNOSIS — R0609 Other forms of dyspnea: Secondary | ICD-10-CM

## 2023-07-19 DIAGNOSIS — Z79899 Other long term (current) drug therapy: Secondary | ICD-10-CM | POA: Diagnosis not present

## 2023-07-19 DIAGNOSIS — G4733 Obstructive sleep apnea (adult) (pediatric): Secondary | ICD-10-CM | POA: Diagnosis not present

## 2023-07-19 DIAGNOSIS — J449 Chronic obstructive pulmonary disease, unspecified: Secondary | ICD-10-CM | POA: Diagnosis not present

## 2023-07-19 DIAGNOSIS — M25531 Pain in right wrist: Secondary | ICD-10-CM | POA: Diagnosis not present

## 2023-07-19 DIAGNOSIS — E669 Obesity, unspecified: Secondary | ICD-10-CM | POA: Diagnosis not present

## 2023-07-19 DIAGNOSIS — R0683 Snoring: Secondary | ICD-10-CM

## 2023-07-19 DIAGNOSIS — R0600 Dyspnea, unspecified: Secondary | ICD-10-CM | POA: Insufficient documentation

## 2023-07-19 DIAGNOSIS — R0989 Other specified symptoms and signs involving the circulatory and respiratory systems: Secondary | ICD-10-CM | POA: Diagnosis not present

## 2023-07-19 DIAGNOSIS — M1990 Unspecified osteoarthritis, unspecified site: Secondary | ICD-10-CM | POA: Diagnosis not present

## 2023-07-19 DIAGNOSIS — J45909 Unspecified asthma, uncomplicated: Secondary | ICD-10-CM | POA: Diagnosis not present

## 2023-07-19 NOTE — Patient Instructions (Signed)
Set up home sleep study.  Healthy sleep regimen  Work on healthy weight   Chest xray today.  Continue on Advair and Spiriva  Albuterol inhaler As needed   Activity as tolerated.  Follow up in 6-8 weeks with PFT and As needed

## 2023-07-19 NOTE — Assessment & Plan Note (Signed)
History of asthma-triggers are strong smells and weather changes.  Would continue on Advair and Spiriva.  Needs a full set of PFTs.  She also is on methotrexate and also has a history of rheumatoid arthritis.  If PFTs are significantly abnormal with a decreased diffusing capacity or restrictive changes would consider a high-resolution CT chest to rule out interstitial process  Plan  Patient Instructions  Set up home sleep study.  Healthy sleep regimen  Work on healthy weight   Chest xray today.  Continue on Advair and Spiriva  Albuterol inhaler As needed   Activity as tolerated.  Follow up in 6-8 weeks with PFT and As needed

## 2023-07-19 NOTE — Progress Notes (Signed)
@Patient  ID: [name], female    DOB: 1941-07-10, 82 y.o.   MRN: [id number]  Chief Complaint  Patient presents with   Consult    Referring provider: Rodrigo Ran, MD  HPI: 82 year old female seen for sleep and pulmonary consult July 19, 2023 for snoring and daytime sleepiness along with shortness of breath  TEST/EVENTS :   07/19/2023 Sleep consult  Patient presents for a sleep consult today.  Kindly referred by cardiology  Dr.Croitoru and Jasmine Littler NP .  Patient says she has a history of sleep apnea that was diagnosed in the early 1980s.  She says she wore CPAP briefly.  But stopped wearing it because she felt she did not need it any longer after she lost a little weight.  Patient says she has had ongoing snoring restless sleep and daytime sleepiness over the years this seems to have gotten worse over the last few years.  She also has been having intermittent shortness of breath it has been going on for a few years.  She gets short of breath when she tries to do activities such as prolonged walking or housework.  Patient does live alone.  Is fully independent is able to drive and do her own shopping and housework but has to stop frequently.  Patient says she is limited by joint pain and neuropathy.  She was diagnosed with rheumatoid arthritis around 10 years ago.  She has been on methotrexate since then.  She does have also underlying asthma that is managed by primary care provider.  She is on Advair and Spiriva.  Says that she has some intermittent cough and wheezing.  Occasionally takes her albuterol.  Her main complaint is that she just wears out of her breath and energy when she tries to do any activities. Does have a history of breast cancer previously treated with lumpectomy radiation and chemo in 2012.  Typically goes to bed about 9 PM.  Takes only a few minutes to go to sleep.  Gets up at 8 AM.  Epworth score is 8 out of 24.  Typically gets sleepy if she sits down to watch TV,  as a passenger of a car.  She has no history of congestive heart failure or stroke.  Recent 2D echo showed preserved EF and normal pulmonary artery pressure.  She says she gets routine blood work through her primary care and rheumatologist.  She naps on occasion a couple times a week.  She does not use any sleep aids.  Denies any symptoms suspicious for cataplexy or sleep paralysis.  She does have chronic neuropathy and is on gabapentin.  She also has rheumatoid joint pain and takes tramadol on occasion.  Social history patient is single.  She does have an adult child.  She is a never smoker.  No alcohol or drug use.  She lives alone.  As above is fully independent.  Past Surgical History:  Procedure Laterality Date   ABDOMINAL HYSTERECTOMY     BILATERAL OOPHORECTOMY  2009   Boil removal under arm Right    BREAST BIOPSY Bilateral 1998   BREAST BIOPSY Right 08/11/2018   FIBROADENOMATOID NODULE WITH CALCIFICATIONS    BREAST LUMPECTOMY Right 03/2012   CARDIAC CATHETERIZATION  04/10/2010, 05/07/2004   LAD lesion, blood flow OK, per pt.   CARDIOVASCULAR STRESS TEST  02/15/2018   CARPAL TUNNEL RELEASE     left   CARPAL TUNNEL RELEASE Right    CATARACT EXTRACTION, BILATERAL     COLONOSCOPY  12/20/2012   Procedure: COLONOSCOPY;  Surgeon: Hart Carwin, MD;  Location: [facility];  Service: Endoscopy;  Laterality: N/A;   ESOPHAGOGASTRODUODENOSCOPY  12/20/2012   Procedure: ESOPHAGOGASTRODUODENOSCOPY (EGD);  Surgeon: Hart Carwin, MD;  Location: [facility];  Service: Endoscopy;  Laterality: N/A;   LESION REMOVAL  11/27/2011   Procedure: LESION REMOVAL;  Surgeon: Ernestene Mention, MD;  Location: [facility];  Service: General;  Laterality: Right;  Skin tag removed from under right eye. No specimen   LUMBAR LAMINECTOMY     back surg. x 2 - 1980's and 1999   NM MYOVIEW LTD  08/02/2012   no ischemia   PARTIAL HYSTERECTOMY     BSO   PORTACATH PLACEMENT  11/27/2011   Procedure: INSERTION  PORT-A-CATH;  Surgeon: Ernestene Mention, MD;  Location: [facility];  Service: General;  Laterality: Right;   TOTAL HIP ARTHROPLASTY Right 03/17/2018   Procedure: RIGHT TOTAL HIP ARTHROPLASTY ANTERIOR APPROACH;  Surgeon: Samson Frederic, MD;  Location: [facility];  Service: Orthopedics;  Laterality: Right;  Needs RNFA   TOTAL KNEE ARTHROPLASTY  07/28/2006   right   US ECHOCARDIOGRAPHY  08/14/2008   technically difficult-mild LVH, EF =>55%,mild annular ca+,AOV mildly sclerotic     Allergies  Allergen Reactions   Crestor [Rosuvastatin] Other (See Comments)    crampin of legs and feet--pt is currently taking.   Lactose Intolerance (Gi) Other (See Comments)    ABD. CRAMPS   Lipitor [Atorvastatin Calcium] Other (See Comments)    LEG CRAMPS   Zocor [Simvastatin - High Dose] Other (See Comments)    Leg cramps    Immunization History  Administered Date(s) Administered   COVID-19, mRNA, vaccine(Comirnaty)12 years and older 10/06/2022   Influenza, High Dose Seasonal PF 09/30/2016, 09/27/2019   Influenza-Unspecified 09/20/2017   PFIZER Comirnaty(Gray Top)Covid-19 Tri-Sucrose Vaccine 05/07/2021   PFIZER(Purple Top)SARS-COV-2 Vaccination 09/02/2020   Pfizer Covid-19 Vaccine Bivalent Booster 66yrs & up 12/24/2021   Respiratory Syncytial Virus Vaccine,Recomb Aduvanted(Arexvy) 11/23/2022   Zoster Recombinant(Shingrix) 09/04/2021, 11/06/2021    Past Medical History:  Diagnosis Date   Anemia    history of   Arthritis    osteo - s/p right total knee   Asthma    daily and prn inhalers   Boil, axilla    Breast cancer (HCC) 10/28/11   right breast   Breast cancer of upper-outer quadrant of right female breast (HCC) 11/04/2011   2.5 x 2.4 x 1.7 cm biopsy-proven invasive ductal carcinoma and ductal carcinoma in situ in the anterior aspect of the upper outer  quadrant of the right breast. There are associated linear and nodular enhancing masses extending from this mass to the   nipple/areolar complex with abnormal thickening and enhancement of the nipple and areola, compatible with extension of malignancy into  the nipple/ar   CAD (coronary artery disease)    Colon polyp    GERD (gastroesophageal reflux disease)    Grave's disease    no current meds.   H pylori ulcer    Heart murmur    History of blood transfusion 2006   History of chemotherapy    neoadjuvant: 5 cycles of Taxol/carboplatinum:  last dose 02/24/12   History of hiatal hernia    Hyperlipidemia    Hypertension    under control with med.   Hypothyroid    Liver cyst    History of    Pre-diabetes    RA (rheumatoid arthritis) (HCC)    S/P chemotherapy, time  since 4-12 weeks finished 02/20/2011   S/P radiation therapy    Right Breast High Axilla and Supraclavicular gegion: 4500 cGy/25 Fractions with boost for a total of 6300 cGy   Sleep apnea sleep study 09/18/2005   uses CPAP occ. - to bring machine DOS   Spondylosis    cervical and lumbar   Use of letrozole (Femara) start 07/21/12    Tobacco History: Social History   Tobacco Use  Smoking Status Never  Smokeless Tobacco Never   Counseling given: Not Answered   Outpatient Medications Prior to Visit  Medication Sig Dispense Refill   aspirin 81 MG EC tablet Take 81 mg by mouth daily.     Cholecalciferol (VITAMIN D-3) 5000 units TABS Take 5,000 Units by mouth daily.     Cyanocobalamin (B-12) 1000 MCG TABS Take 1,000 mcg by mouth daily.     diclofenac sodium (VOLTAREN) 1 % GEL Apply 2-4 g topically 4 (four) times daily as needed. For joint pain.  3   Evolocumab (REPATHA SURECLICK) 140 MG/ML SOAJ as directed Subcutaneous every two weeks as directed 140 mg sub q with each dose for 90 days     ezetimibe (ZETIA) 10 MG tablet Take 10 mg by mouth every evening.      Fluticasone-Salmeterol (ADVAIR) 100-50 MCG/DOSE AEPB Inhale 1 puff into the lungs daily.      folic acid (FOLVITE) 1 MG tablet Take 1 mg by mouth daily.     gabapentin (NEURONTIN) 100 MG  capsule Take 3 capsules (300 mg total) by mouth at bedtime. (Patient taking differently: Take 100 mg by mouth at bedtime.)     HYDROcodone bit-homatropine (HYCODAN) 5-1.5 MG/5ML syrup Take 5 mLs by mouth every 6 (six) hours as needed.     ipratropium (ATROVENT HFA) 17 MCG/ACT inhaler Inhale 2 puffs into the lungs every 6 (six) hours.     levothyroxine (SYNTHROID, LEVOTHROID) 75 MCG tablet Take 75 mcg by mouth daily before breakfast.   2   Methotrexate (XATMEP PO) Take 0.6 mg by mouth once a week.     omeprazole (PRILOSEC) 20 MG capsule Take 20 mg by mouth daily.     RSV vaccine recomb adjuvanted (AREXVY) 120 MCG/0.5ML injection Inject into the muscle. 0.5 mL 0   TIADYLT ER 360 MG 24 hr capsule Take 360 mg by mouth at bedtime.     Tiotropium Bromide Monohydrate (SPIRIVA RESPIMAT IN) Spiriva with HandiHaler     traMADol (ULTRAM) 50 MG tablet Take by mouth every 6 (six) hours as needed.     triamterene-hydrochlorothiazide (MAXZIDE-25) 37.5-25 MG tablet TAKE ONE TABLET BY MOUTH EVERY MORNING 90 tablet 3   No facility-administered medications prior to visit.     Review of Systems:   Constitutional:   No  weight loss, night sweats,  Fevers, chills,  +fatigue, or  lassitude.  HEENT:   No headaches,  Difficulty swallowing,  Tooth/dental problems, or  Sore throat,                No sneezing, itching, ear ache, nasal congestion, post nasal drip,   CV:  No chest pain,  Orthopnea, PND, swelling in lower extremities, anasarca, dizziness, palpitations, syncope.   GI  No heartburn, indigestion, abdominal pain, nausea, vomiting, diarrhea, change in bowel habits, loss of appetite, bloody stools.   Resp:   No chest wall deformity  Skin: no rash or lesions.  GU: no dysuria, change in color of urine, no urgency or frequency.  No flank pain, no hematuria  MS:  No joint pain or swelling.  No decreased range of motion.  No back pain.    Physical Exam  BP (!) 140/82   Pulse 84   Ht 5\' 4"  (1.626 m)    Wt 210 lb (95.3 kg)   SpO2 99%   BMI 36.05 kg/m   GEN: A/Ox3; pleasant , NAD, well nourished    HEENT:  [place]/AT,  NOSE-clear, THROAT-clear, no lesions, no postnasal drip or exudate noted.   NECK:  Supple w/ fair ROM; no JVD; normal carotid impulses w/o bruits; no thyromegaly or nodules palpated; no lymphadenopathy.    RESP  Clear  P & A; w/o, wheezes/ rales/ or rhonchi. no accessory muscle use, no dullness to percussion  CARD:  RRR, no m/r/g, tr peripheral edema, pulses intact, no cyanosis or clubbing.  GI:   Soft & nt; nml bowel sounds; no organomegaly or masses detected.   Musco: Warm bil, no deformities or joint swelling noted.   Neuro: alert, no focal deficits noted.    Skin: Warm, no lesions or rashes        BNP   ProBNP No results found for: "PROBNP"  Imaging: No results found.  Administration History     None           No data to display          No results found for: "NITRICOXIDE"      Assessment & Plan:   Snoring Snoring, restless sleep, daytime sleepiness, history of sleep apnea all suspicious for underlying sleep apnea.  Will set up patient for home sleep study.  Patient education given on sleep apnea  - discussed how weight can impact sleep and risk for sleep disordered breathing - discussed options to assist with weight loss: combination of diet modification, cardiovascular and strength training exercises   - had an extensive discussion regarding the adverse health consequences related to untreated sleep disordered breathing - specifically discussed the risks for hypertension, coronary artery disease, cardiac dysrhythmias, cerebrovascular disease, and diabetes - lifestyle modification discussed   - discussed how sleep disruption can increase risk of accidents, particularly when driving - safe driving practices were discussed   Plan  Patient Instructions  Set up home sleep study.  Healthy sleep regimen  Work on healthy weight    Chest xray today.  Continue on Advair and Spiriva  Albuterol inhaler As needed   Activity as tolerated.  Follow up in 6-8 weeks with PFT and As needed       Asthma History of asthma-triggers are strong smells and weather changes.  Would continue on Advair and Spiriva.  Needs a full set of PFTs.  She also is on methotrexate and also has a history of rheumatoid arthritis.  If PFTs are significantly abnormal with a decreased diffusing capacity or restrictive changes would consider a high-resolution CT chest to rule out interstitial process  Plan  Patient Instructions  Set up home sleep study.  Healthy sleep regimen  Work on healthy weight   Chest xray today.  Continue on Advair and Spiriva  Albuterol inhaler As needed   Activity as tolerated.  Follow up in 6-8 weeks with PFT and As needed       Dyspnea Chronic dyspnea with exertion-suspect is multifactorial.  Patient has multiple comorbidities including underlying rheumatoid arthritis with chronic pain, neuropathy, asthma.  Suspect she has a component of deconditioning.  Will set up PFTs.  If restrictive changes and/or decreased diffusing capacity consider high-resolution CT chest to  rule out underlying interstitial process.  Chest x-ray today.  Continue with asthma control maintenance regimen.  Recent echo showed preserved EF with normal pulmonary artery pressures  Plan  Patient Instructions  Set up home sleep study.  Healthy sleep regimen  Work on healthy weight   Chest xray today.  Continue on Advair and Spiriva  Albuterol inhaler As needed   Activity as tolerated.  Follow up in 6-8 weeks with PFT and As needed         Rubye Oaks, NP 07/19/2023

## 2023-07-19 NOTE — Assessment & Plan Note (Signed)
Snoring, restless sleep, daytime sleepiness, history of sleep apnea all suspicious for underlying sleep apnea.  Will set up patient for home sleep study.  Patient education given on sleep apnea  - discussed how weight can impact sleep and risk for sleep disordered breathing - discussed options to assist with weight loss: combination of diet modification, cardiovascular and strength training exercises   - had an extensive discussion regarding the adverse health consequences related to untreated sleep disordered breathing - specifically discussed the risks for hypertension, coronary artery disease, cardiac dysrhythmias, cerebrovascular disease, and diabetes - lifestyle modification discussed   - discussed how sleep disruption can increase risk of accidents, particularly when driving - safe driving practices were discussed   Plan  Patient Instructions  Set up home sleep study.  Healthy sleep regimen  Work on healthy weight   Chest xray today.  Continue on Advair and Spiriva  Albuterol inhaler As needed   Activity as tolerated.  Follow up in 6-8 weeks with PFT and As needed

## 2023-07-19 NOTE — Assessment & Plan Note (Signed)
Chronic dyspnea with exertion-suspect is multifactorial.  Patient has multiple comorbidities including underlying rheumatoid arthritis with chronic pain, neuropathy, asthma.  Suspect she has a component of deconditioning.  Will set up PFTs.  If restrictive changes and/or decreased diffusing capacity consider high-resolution CT chest to rule out underlying interstitial process.  Chest x-ray today.  Continue with asthma control maintenance regimen.  Recent echo showed preserved EF with normal pulmonary artery pressures  Plan  Patient Instructions  Set up home sleep study.  Healthy sleep regimen  Work on healthy weight   Chest xray today.  Continue on Advair and Spiriva  Albuterol inhaler As needed   Activity as tolerated.  Follow up in 6-8 weeks with PFT and As needed

## 2023-08-02 DIAGNOSIS — E039 Hypothyroidism, unspecified: Secondary | ICD-10-CM | POA: Diagnosis not present

## 2023-08-02 DIAGNOSIS — E785 Hyperlipidemia, unspecified: Secondary | ICD-10-CM | POA: Diagnosis not present

## 2023-08-02 DIAGNOSIS — N1831 Chronic kidney disease, stage 3a: Secondary | ICD-10-CM | POA: Diagnosis not present

## 2023-08-02 DIAGNOSIS — M1990 Unspecified osteoarthritis, unspecified site: Secondary | ICD-10-CM | POA: Diagnosis not present

## 2023-08-02 DIAGNOSIS — M542 Cervicalgia: Secondary | ICD-10-CM | POA: Diagnosis not present

## 2023-08-02 DIAGNOSIS — I129 Hypertensive chronic kidney disease with stage 1 through stage 4 chronic kidney disease, or unspecified chronic kidney disease: Secondary | ICD-10-CM | POA: Diagnosis not present

## 2023-08-02 DIAGNOSIS — R300 Dysuria: Secondary | ICD-10-CM | POA: Diagnosis not present

## 2023-08-02 DIAGNOSIS — N390 Urinary tract infection, site not specified: Secondary | ICD-10-CM | POA: Diagnosis not present

## 2023-08-02 DIAGNOSIS — E1169 Type 2 diabetes mellitus with other specified complication: Secondary | ICD-10-CM | POA: Diagnosis not present

## 2023-08-02 DIAGNOSIS — M069 Rheumatoid arthritis, unspecified: Secondary | ICD-10-CM | POA: Diagnosis not present

## 2023-08-02 DIAGNOSIS — R700 Elevated erythrocyte sedimentation rate: Secondary | ICD-10-CM | POA: Diagnosis not present

## 2023-08-02 DIAGNOSIS — I2510 Atherosclerotic heart disease of native coronary artery without angina pectoris: Secondary | ICD-10-CM | POA: Diagnosis not present

## 2023-08-03 DIAGNOSIS — G4730 Sleep apnea, unspecified: Secondary | ICD-10-CM | POA: Diagnosis not present

## 2023-08-04 DIAGNOSIS — Z6836 Body mass index (BMI) 36.0-36.9, adult: Secondary | ICD-10-CM | POA: Diagnosis not present

## 2023-08-04 DIAGNOSIS — Z01419 Encounter for gynecological examination (general) (routine) without abnormal findings: Secondary | ICD-10-CM | POA: Diagnosis not present

## 2023-08-26 ENCOUNTER — Ambulatory Visit (INDEPENDENT_AMBULATORY_CARE_PROVIDER_SITE_OTHER): Payer: PPO | Admitting: Adult Health

## 2023-08-26 DIAGNOSIS — G4733 Obstructive sleep apnea (adult) (pediatric): Secondary | ICD-10-CM | POA: Diagnosis not present

## 2023-09-02 ENCOUNTER — Encounter: Payer: Self-pay | Admitting: Adult Health

## 2023-09-02 ENCOUNTER — Ambulatory Visit: Payer: PPO | Admitting: Adult Health

## 2023-09-02 VITALS — BP 130/72 | HR 81 | Ht 65.0 in | Wt 213.8 lb

## 2023-09-02 DIAGNOSIS — G4733 Obstructive sleep apnea (adult) (pediatric): Secondary | ICD-10-CM

## 2023-09-02 DIAGNOSIS — J4530 Mild persistent asthma, uncomplicated: Secondary | ICD-10-CM | POA: Diagnosis not present

## 2023-09-02 NOTE — Progress Notes (Signed)
@Patient  ID: [name], female    DOB: 09-06-1941, 82 y.o.   MRN: [id number]  Chief Complaint  Patient presents with   Follow-up    Referring provider: Rodrigo Ran, MD  HPI: 82 yo female seen for consult on 07/19/23 for sleep apnea and Asthma  Medical history significant for rheumatoid arthritis and hx of breast cancer s/p lumpectomy, Chemo/Rad   TEST/EVENTS :  HST 08/03/23 showed moderate OSA with AHI 26.8/hr and SpO2 at 71%.   09/02/2023 Follow up : OSA  Patient returns for a 6-week follow-up.  Patient was seen last visit with complaints of snoring, daytime sleepiness and restless sleep.  She had previously been diagnosed in the early 1980s with sleep apnea but only wore CPAP briefly.  She was set up for home sleep study that was done on August 03, 2023.  This showed moderate obstructive sleep apnea with AHI of 26.8/hour and an SpO2 low at 71%.  We discussed her sleep study results in detail.  Went over treatment options including CPAP therapy.  Patient is in agreement to begin CPAP.    Allergies  Allergen Reactions   Crestor [Rosuvastatin] Other (See Comments)    crampin of legs and feet--pt is currently taking.   Lactose Intolerance (Gi) Other (See Comments)    ABD. CRAMPS   Lipitor [Atorvastatin Calcium] Other (See Comments)    LEG CRAMPS   Zocor [Simvastatin - High Dose] Other (See Comments)    Leg cramps    Immunization History  Administered Date(s) Administered   COVID-19, mRNA, vaccine(Comirnaty)12 years and older 10/06/2022   Influenza, High Dose Seasonal PF 09/30/2016, 09/27/2019   Influenza-Unspecified 09/20/2017   PFIZER Comirnaty(Gray Top)Covid-19 Tri-Sucrose Vaccine 05/07/2021   PFIZER(Purple Top)SARS-COV-2 Vaccination 09/02/2020   Pfizer Covid-19 Vaccine Bivalent Booster 12yrs & up 12/24/2021   Respiratory Syncytial Virus Vaccine,Recomb Aduvanted(Arexvy) 11/23/2022   Zoster Recombinant(Shingrix) 09/04/2021, 11/06/2021    Past Medical History:   Diagnosis Date   Anemia    history of   Arthritis    osteo - s/p right total knee   Asthma    daily and prn inhalers   Boil, axilla    Breast cancer (HCC) 10/28/11   right breast   Breast cancer of upper-outer quadrant of right female breast (HCC) 11/04/2011   2.5 x 2.4 x 1.7 cm biopsy-proven invasive ductal carcinoma and ductal carcinoma in situ in the anterior aspect of the upper outer  quadrant of the right breast. There are associated linear and nodular enhancing masses extending from this mass to the  nipple/areolar complex with abnormal thickening and enhancement of the nipple and areola, compatible with extension of malignancy into  the nipple/ar   CAD (coronary artery disease)    Colon polyp    GERD (gastroesophageal reflux disease)    Grave's disease    no current meds.   H pylori ulcer    Heart murmur    History of blood transfusion 2006   History of chemotherapy    neoadjuvant: 5 cycles of Taxol/carboplatinum:  last dose 02/24/12   History of hiatal hernia    Hyperlipidemia    Hypertension    under control with med.   Hypothyroid    Liver cyst    History of    Pre-diabetes    RA (rheumatoid arthritis) (HCC)    S/P chemotherapy, time since 4-12 weeks finished 02/20/2011   S/P radiation therapy    Right Breast High Axilla and Supraclavicular gegion: 4500 cGy/25 Fractions with boost for  a total of 6300 cGy   Sleep apnea sleep study 09/18/2005   uses CPAP occ. - to bring machine DOS   Spondylosis    cervical and lumbar   Use of letrozole (Femara) start 07/21/12    Tobacco History: Social History   Tobacco Use  Smoking Status Never  Smokeless Tobacco Never   Counseling given: Not Answered   Outpatient Medications Prior to Visit  Medication Sig Dispense Refill   aspirin 81 MG EC tablet Take 81 mg by mouth daily.     Cholecalciferol (VITAMIN D-3) 5000 units TABS Take 5,000 Units by mouth daily.     Cyanocobalamin (B-12) 1000 MCG TABS Take 1,000 mcg by mouth daily.      diclofenac sodium (VOLTAREN) 1 % GEL Apply 2-4 g topically 4 (four) times daily as needed. For joint pain.  3   Evolocumab (REPATHA SURECLICK) 140 MG/ML SOAJ as directed Subcutaneous every two weeks as directed 140 mg sub q with each dose for 90 days     ezetimibe (ZETIA) 10 MG tablet Take 10 mg by mouth every evening.      Fluticasone-Salmeterol (ADVAIR) 100-50 MCG/DOSE AEPB Inhale 1 puff into the lungs daily.      folic acid (FOLVITE) 1 MG tablet Take 1 mg by mouth daily.     gabapentin (NEURONTIN) 100 MG capsule Take 3 capsules (300 mg total) by mouth at bedtime. (Patient taking differently: Take 100 mg by mouth at bedtime.)     HYDROcodone bit-homatropine (HYCODAN) 5-1.5 MG/5ML syrup Take 5 mLs by mouth every 6 (six) hours as needed.     ipratropium (ATROVENT HFA) 17 MCG/ACT inhaler Inhale 2 puffs into the lungs every 6 (six) hours.     levothyroxine (SYNTHROID, LEVOTHROID) 75 MCG tablet Take 75 mcg by mouth daily before breakfast.   2   Methotrexate (XATMEP PO) Take 0.6 mg by mouth once a week.     omeprazole (PRILOSEC) 20 MG capsule Take 20 mg by mouth daily.     RSV vaccine recomb adjuvanted (AREXVY) 120 MCG/0.5ML injection Inject into the muscle. 0.5 mL 0   TIADYLT ER 360 MG 24 hr capsule Take 360 mg by mouth at bedtime.     Tiotropium Bromide Monohydrate (SPIRIVA RESPIMAT IN) Spiriva with HandiHaler     traMADol (ULTRAM) 50 MG tablet Take by mouth every 6 (six) hours as needed.     triamterene-hydrochlorothiazide (MAXZIDE-25) 37.5-25 MG tablet TAKE ONE TABLET BY MOUTH EVERY MORNING 90 tablet 3   No facility-administered medications prior to visit.     Review of Systems:   Constitutional:   No  weight loss, night sweats,  Fevers, chills, + fatigue, or  lassitude.  HEENT:   No headaches,  Difficulty swallowing,  Tooth/dental problems, or  Sore throat,                No sneezing, itching, ear ache, nasal congestion, post nasal drip,   CV:  No chest pain,  Orthopnea, PND,  swelling in lower extremities, anasarca, dizziness, palpitations, syncope.   GI  No heartburn, indigestion, abdominal pain, nausea, vomiting, diarrhea, change in bowel habits, loss of appetite, bloody stools.   Resp: No shortness of breath with exertion or at rest.  No excess mucus, no productive cough,  No non-productive cough,  No coughing up of blood.  No change in color of mucus.  No wheezing.  No chest wall deformity  Skin: no rash or lesions.  GU: no dysuria, change in color of urine,  no urgency or frequency.  No flank pain, no hematuria   MS:  No joint pain or swelling.  No decreased range of motion.  No back pain.    Physical Exam  BP 130/72   Pulse 81   Ht 5\' 5"  (1.651 m)   Wt 213 lb 12.8 oz (97 kg)   SpO2 98%   BMI 35.58 kg/m   GEN: A/Ox3; pleasant , NAD, well nourished    HEENT:  [place]/AT,   NOSE-clear, THROAT-clear, no lesions, no postnasal drip or exudate noted.   NECK:  Supple w/ fair ROM; no JVD; normal carotid impulses w/o bruits; no thyromegaly or nodules palpated; no lymphadenopathy.    RESP  Clear  P & A; w/o, wheezes/ rales/ or rhonchi. no accessory muscle use, no dullness to percussion  CARD:  RRR, no m/r/g, no peripheral edema, pulses intact, no cyanosis or clubbing.  GI:   Soft & nt; nml bowel sounds; no organomegaly or masses detected.   Musco: Warm bil, no deformities or joint swelling noted.   Neuro: alert, no focal deficits noted.    Skin: Warm, no lesions or rashes    Lab Results:    ProBNP No results found for: "PROBNP"  Imaging: No results found.  Administration History     None           No data to display          No results found for: "NITRICOXIDE"      Assessment & Plan:   Sleep apnea Moderate obstructive sleep apnea.  Patient will begin CPAP therapy auto CPAP 5 to 15 cm H2O.  Patient education given on sleep apnea.  - discussed how weight can impact sleep and risk for sleep disordered breathing - discussed  options to assist with weight loss: combination of diet modification, cardiovascular and strength training exercises   - had an extensive discussion regarding the adverse health consequences related to untreated sleep disordered breathing - specifically discussed the risks for hypertension, coronary artery disease, cardiac dysrhythmias, cerebrovascular disease, and diabetes - lifestyle modification discussed   - discussed how sleep disruption can increase risk of accidents, particularly when driving - safe driving practices were discussed   Plan  Patient Instructions  Begin CPAP At bedtime, wear all night long for at least 6hr or more .  Healthy sleep regimen  Work on healthy weight  May like the Dreamwear nasal mask Saline nasal spray As needed    Continue on Advair and Spiriva  Albuterol inhaler As needed   Activity as tolerated.  Follow up in 2 months with PFT and As needed       Asthma Continue on current regimen and follow-up with PFTs on return visit     Rubye Oaks, NP 09/02/2023

## 2023-09-02 NOTE — Patient Instructions (Addendum)
Begin CPAP At bedtime, wear all night long for at least 6hr or more .  Healthy sleep regimen  Work on healthy weight  May like the Dreamwear nasal mask Saline nasal spray As needed    Continue on Advair and Spiriva  Albuterol inhaler As needed   Activity as tolerated.  Follow up in 2 months with PFT and As needed

## 2023-09-02 NOTE — Assessment & Plan Note (Signed)
Moderate obstructive sleep apnea.  Patient will begin CPAP therapy auto CPAP 5 to 15 cm H2O.  Patient education given on sleep apnea.  - discussed how weight can impact sleep and risk for sleep disordered breathing - discussed options to assist with weight loss: combination of diet modification, cardiovascular and strength training exercises   - had an extensive discussion regarding the adverse health consequences related to untreated sleep disordered breathing - specifically discussed the risks for hypertension, coronary artery disease, cardiac dysrhythmias, cerebrovascular disease, and diabetes - lifestyle modification discussed   - discussed how sleep disruption can increase risk of accidents, particularly when driving - safe driving practices were discussed   Plan  Patient Instructions  Begin CPAP At bedtime, wear all night long for at least 6hr or more .  Healthy sleep regimen  Work on healthy weight  May like the Dreamwear nasal mask Saline nasal spray As needed    Continue on Advair and Spiriva  Albuterol inhaler As needed   Activity as tolerated.  Follow up in 2 months with PFT and As needed

## 2023-09-02 NOTE — Assessment & Plan Note (Signed)
Continue on current regimen and follow-up with PFTs on return visit

## 2023-09-02 NOTE — Addendum Note (Signed)
Addended by: Glynda Jaeger on: 09/02/2023 04:53 PM   Modules accepted: Orders

## 2023-09-03 NOTE — Progress Notes (Signed)
Reviewed and agree with assessment/plan.   Coralyn Helling, MD [facility] Pulmonary/Critical Care 09/03/2023, 1:24 PM Pager:  [phone]

## 2023-09-06 NOTE — Progress Notes (Signed)
Appt was 09/02/23

## 2023-09-16 DIAGNOSIS — G4733 Obstructive sleep apnea (adult) (pediatric): Secondary | ICD-10-CM | POA: Diagnosis not present

## 2023-09-20 ENCOUNTER — Inpatient Hospital Stay: Payer: PPO | Attending: Adult Health | Admitting: Adult Health

## 2023-09-20 ENCOUNTER — Other Ambulatory Visit (HOSPITAL_BASED_OUTPATIENT_CLINIC_OR_DEPARTMENT_OTHER): Payer: Self-pay

## 2023-09-20 ENCOUNTER — Encounter: Payer: Self-pay | Admitting: Adult Health

## 2023-09-20 VITALS — BP 154/72 | HR 80 | Temp 97.2°F | Resp 18 | Wt 212.3 lb

## 2023-09-20 DIAGNOSIS — Z08 Encounter for follow-up examination after completed treatment for malignant neoplasm: Secondary | ICD-10-CM | POA: Insufficient documentation

## 2023-09-20 DIAGNOSIS — Z853 Personal history of malignant neoplasm of breast: Secondary | ICD-10-CM | POA: Insufficient documentation

## 2023-09-20 MED ORDER — INFLUENZA VAC A&B SURF ANT ADJ 0.5 ML IM SUSY
0.5000 mL | PREFILLED_SYRINGE | Freq: Once | INTRAMUSCULAR | 0 refills | Status: AC
  Filled 2023-09-20: qty 0.5, 1d supply, fill #0

## 2023-09-20 MED ORDER — COVID-19 MRNA VAC-TRIS(PFIZER) 30 MCG/0.3ML IM SUSY
0.3000 mL | PREFILLED_SYRINGE | Freq: Once | INTRAMUSCULAR | 0 refills | Status: AC
  Filled 2023-09-20: qty 0.3, 1d supply, fill #0

## 2023-09-20 NOTE — Assessment & Plan Note (Signed)
[name] is an 82 year old woman with history of right-sided breast cancer status post neoadjuvant chemotherapy followed by lumpectomy, adjuvant radiation, and 7 years of antiestrogen therapy with letrozole that she completed in August 2020.  History of right sided breast cancer: She has no clinical or radiographic signs of breast cancer recurrence.  She will continue with annual bilateral screening 3D mammograms next due in June 2025.  She knows to let me know if she develops any new symptoms or concerns for breast cancer recurrence and we will see her sooner than 1 year for follow-up. Genetic testing: She underwent updated genetic testing that was negative.  I reached out to our geneticist to discuss the question about whether gene connect would be of benefit due to it screening for familial cholesterol issues. Health maintenance: [name] has excelled at her social connectedness particularly at this stage in her life.  She is also very active which is great.  I applauded her for her work and this along with her staying up-to-date with her primary care provider and GYN.  I recommended that she see the providers on her care team regularly.  We discussed healthy diet and the suggestion to modify that somewhat to work to lose some weight. Hypertension: Her blood pressure today is elevated at 154/72.  She has no symptoms.  She is going to get batteries on her way home so she can check her blood pressure at home and will follow-up with her primary care if it continues to stay elevated.  [name] will return to see [place] in 1 year for continued long-term follow-up.  She knows to call for any questions or concerns that may arise between now and her next visit.

## 2023-09-20 NOTE — Progress Notes (Signed)
[facility] Cancer Follow up:    Rodrigo Ran, MD [address] [name] [address]   DIAGNOSIS:  Cancer Staging  No matching staging information was found for the patient.   SUMMARY OF ONCOLOGIC HISTORY: Oncology History  History of right breast cancer  12/02/2011 -  Neo-Adjuvant Chemotherapy   Taxotere and Cytoxan 4    04/20/2012 Initial Diagnosis   Right breast invasive ductal carcinoma with DCIS 2.5 cm with the MRI showing a maximum diameter of 7.5 cm ER/PR positive HER-2 negative Ki-67 15%    Surgery   Lumpectomy: IDC with lymphovascular invasion 1.9 cm, 2 sentinel lymph nodes negative    Radiation Therapy   Adjuvant radiation   07/21/2012 - 07/2019 Anti-estrogen oral therapy   Letrozole 2.5 mg daily    11/30/2022 Genetic Testing   Updated her genetic testing in 2023: No pathogenic mutations.  Genetic testing identified a variant of uncertain significance (VUS) in the MLH1 gene called c.[id number]>G.    The Common Hereditary Cancers Panel offered by [facility] includes sequencing and/or deletion duplication testing of the following 48 genes: APC, ATM, AXIN2, BAP1, BARD1, BMPR1A, BRCA1, BRCA2, BRIP1, CDH1, CDK4, CDKN2A (p14ARF and p16INK4a only), CHEK2, CTNNA1, DICER1, EPCAM (Deletion/duplication testing only), FH, GREM1 (promoter region duplication testing only), HOXB13, KIT, MBD4, MEN1, MLH1, MSH2, MSH3, MSH6, MUTYH, NF1, NHTL1, PALB2, PDGFRA, PMS2, POLD1, POLE, PTEN, RAD51C, RAD51D, SDHA (sequencing analysis only except exon 14), SDHB, SDHC, SDHD, SMAD4, SMARCA4. STK11, TP53, TSC1, TSC2, and VHL.     CURRENT THERAPY: observation  INTERVAL HISTORY: [name] 82 y.o. female returns for follow-up of her history of right sided estrogen positive breast cancer diagnosed in 2012.  She completed therapy in August 2020 and is here for follow-up and surveillance.  She continues on observation alone.  Her most recent bilateral breast screening mammogram occurred on  June 15, 2023 demonstrating no mammographic evidence of malignancy and breast density category B.  He underwent updating genetic testing in December 2023 that identified no pathogenic mutations but noted a variant of uncertain significance.  This is treated as a benign result and she understands this.  She received something in the mail about [facility] connect and wonders if this is the same thing that she already had completed with her genetic testing.  She is doing well and continues to see Dr. Waynard Edwards, her PCP and Dr. Renaldo Fiddler her GYN.  She says that her BP at other MD offices has been better than it is today.  She has not been able to check her BP at home due to needing batteries for her machine which she plans on picking up today.    She continues to live home alone without difficulty, but sometimes will stay with her daughter who lives in [place].    [name] continues to be active in church "[employer] in [place], [place]--bible study on Wednesday night, prayer on Friday night, Sunday school and service on Sundays.  Sometimes she will visit new churches too in order to meet others and network.  She also meets with her women's ministry at church and is active with the participants in that network.  She enjoys taking a nice Sunday afternoon drive.  She enjoys going to the mall to walk and browse about 1-2 times per week.  She does her own grocery shopping, and does her own housework which keeps her active.  She went to [facility] in July, and during Christmas she will travel to [place] with family  to get her nephew from school and bring him home.    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 (HCC) 09/04/2021   Atherosclerotic heart disease of native  coronary artery with other forms of angina pectoris (HCC) 09/04/2021   Osteoarthritis of right hip 03/17/2018   Primary osteoarthritis of right hip 03/17/2018   Severe obesity (BMI 35.0-35.9 with comorbidity) (HCC) 04/30/2017   Essential hypertension 03/20/2016   CAD (coronary artery disease), native coronary artery 06/06/2013   Hyperlipidemia, poorly tolerant of statins 06/06/2013   Esophageal reflux 12/20/2012   Other dysphagia 12/20/2012   Benign neoplasm of colon 12/20/2012   GERD (gastroesophageal reflux disease) 12/06/2012   Diverticulosis 12/06/2012   Sleep apnea 12/06/2012   Asthma 12/06/2012   Thyroid disease 12/06/2012   History of right breast cancer 04/20/2012    is allergic to crestor [rosuvastatin], lactose intolerance (gi), lipitor [atorvastatin calcium], and zocor [simvastatin - high dose].  MEDICAL HISTORY: Past Medical History:  Diagnosis Date   Anemia    history of   Arthritis    osteo - s/p right total knee   Asthma    daily and prn inhalers   Boil, axilla    Breast cancer (HCC) 10/28/11   right breast   Breast cancer of upper-outer quadrant of right female breast (HCC) 11/04/2011   2.5 x 2.4 x 1.7 cm biopsy-proven invasive ductal carcinoma and ductal carcinoma in situ in the anterior aspect of the upper outer  quadrant of the right breast. There are associated linear and nodular enhancing masses extending from this mass to the  nipple/areolar complex with abnormal thickening and enhancement of the nipple and areola, compatible with extension of malignancy into  the nipple/ar   CAD (coronary artery disease)    Colon polyp    GERD (gastroesophageal reflux disease)    Grave's disease    no current meds.   H pylori ulcer    Heart murmur    History of blood transfusion 2006   History of chemotherapy    neoadjuvant: 5 cycles of Taxol/carboplatinum:  last dose 02/24/12   History of hiatal hernia    Hyperlipidemia    Hypertension    under control with med.    Hypothyroid    Liver cyst    History of    Pre-diabetes    RA (rheumatoid arthritis) (HCC)    S/P chemotherapy, time since 4-12 weeks finished 02/20/2011   S/P radiation therapy    Right Breast High Axilla and Supraclavicular gegion: 4500 cGy/25 Fractions with boost for a total of 6300 cGy   Sleep apnea sleep study 09/18/2005   uses CPAP occ. - to bring machine DOS   Spondylosis    cervical and lumbar   Use of letrozole (Femara) start 07/21/12    SURGICAL HISTORY: Past Surgical History:  Procedure Laterality Date   ABDOMINAL HYSTERECTOMY     BILATERAL OOPHORECTOMY  2009   Boil removal under arm Right    BREAST BIOPSY Bilateral 1998   BREAST BIOPSY Right 08/11/2018   FIBROADENOMATOID NODULE WITH CALCIFICATIONS    BREAST LUMPECTOMY Right 03/2012   CARDIAC CATHETERIZATION  04/10/2010, 05/07/2004   LAD lesion, blood flow OK, per pt.   CARDIOVASCULAR STRESS TEST  02/15/2018   CARPAL TUNNEL RELEASE  left   CARPAL TUNNEL RELEASE Right    CATARACT EXTRACTION, BILATERAL     COLONOSCOPY  12/20/2012   Procedure: COLONOSCOPY;  Surgeon: Hart Carwin, MD;  Location: [facility];  Service: Endoscopy;  Laterality: N/A;   ESOPHAGOGASTRODUODENOSCOPY  12/20/2012   Procedure: ESOPHAGOGASTRODUODENOSCOPY (EGD);  Surgeon: Hart Carwin, MD;  Location: [facility];  Service: Endoscopy;  Laterality: N/A;   LESION REMOVAL  11/27/2011   Procedure: LESION REMOVAL;  Surgeon: Ernestene Mention, MD;  Location: [facility];  Service: General;  Laterality: Right;  Skin tag removed from under right eye. No specimen   LUMBAR LAMINECTOMY     back surg. x 2 - 1980's and 1999   NM MYOVIEW LTD  08/02/2012   no ischemia   PARTIAL HYSTERECTOMY     BSO   PORTACATH PLACEMENT  11/27/2011   Procedure: INSERTION PORT-A-CATH;  Surgeon: Ernestene Mention, MD;  Location: [facility];  Service: General;  Laterality: Right;   TOTAL HIP ARTHROPLASTY Right 03/17/2018   Procedure: RIGHT TOTAL HIP  ARTHROPLASTY ANTERIOR APPROACH;  Surgeon: Samson Frederic, MD;  Location: [facility];  Service: Orthopedics;  Laterality: Right;  Needs RNFA   TOTAL KNEE ARTHROPLASTY  07/28/2006   right   US ECHOCARDIOGRAPHY  08/14/2008   technically difficult-mild LVH, EF =>55%,mild annular ca+,AOV mildly sclerotic    SOCIAL HISTORY: Social History   Socioeconomic History   Marital status: Single    Spouse name: Not on file   Number of children: Not on file   Years of education: Not on file   Highest education level: Not on file  Occupational History   Occupation: retired    Associate Professor: RETIRED  Tobacco Use   Smoking status: Never   Smokeless tobacco: Never  Vaping Use   Vaping status: Never Used  Substance and Sexual Activity   Alcohol use: No    Alcohol/week: 0.0 standard drinks of alcohol   Drug use: No   Sexual activity: Not Currently    Birth control/protection: Surgical  Other Topics Concern   Not on file  Social History Narrative   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 states she is not aware of any family anes. prob., does not know where this comment came from   Breast cancer Cousin    Ovarian cancer Cousin 40 - 50   Ovarian cancer Cousin    Ovarian cancer Niece 63   Colon cancer Neg Hx    Esophageal cancer Neg Hx    Rectal cancer Neg Hx    Stomach cancer Neg Hx     Review of Systems  Constitutional:  Negative for appetite change,  chills, fatigue, fever and unexpected weight change.  HENT:   Negative for hearing loss, lump/mass and trouble swallowing.   Eyes:  Negative for eye problems and icterus.  Respiratory:  Negative for chest tightness, cough and shortness of breath.   Cardiovascular:  Negative for chest pain, leg swelling and palpitations.  Gastrointestinal:  Negative for abdominal distention, abdominal pain, constipation, diarrhea, nausea and vomiting.  Endocrine: Negative for hot flashes.  Genitourinary:  Negative for difficulty urinating.   Musculoskeletal:  Negative for arthralgias.  Skin:  Negative for itching and rash.  Neurological:  Negative for dizziness, extremity weakness, headaches and numbness.  Hematological:  Negative for adenopathy. Does not bruise/bleed easily.  Psychiatric/Behavioral:  Negative for depression. The patient is not nervous/anxious.       PHYSICAL EXAMINATION    Vitals:   09/20/23 1004  BP: (!) 154/72  Pulse: 80  Resp: 18  Temp: (!) 97.2 F (36.2 C)  SpO2: 96%    Physical Exam Constitutional:      General: She is not in acute distress.    Appearance: Normal appearance. She is not toxic-appearing.  HENT:     Head: Normocephalic and atraumatic.     Mouth/Throat:     Mouth: Mucous membranes are moist.     Pharynx: Oropharynx is clear. No oropharyngeal exudate or posterior oropharyngeal erythema.  Eyes:     General: No scleral icterus. Cardiovascular:     Rate and Rhythm: Normal rate and regular rhythm.     Pulses: Normal pulses.     Heart sounds: Normal heart sounds.  Pulmonary:     Effort: Pulmonary effort is normal.     Breath sounds: Normal breath sounds.  Chest:     Comments: Right breast status postlumpectomy and radiation no sign of local recurrence left breast is benign. Abdominal:     General: Abdomen is flat. Bowel sounds are normal. There is no distension.     Palpations: Abdomen is soft.     Tenderness: There is no abdominal tenderness.   Musculoskeletal:        General: No swelling.     Cervical back: Neck supple.  Lymphadenopathy:     Cervical: No cervical adenopathy.  Skin:    General: Skin is warm and dry.     Findings: No rash.  Neurological:     General: No focal deficit present.     Mental Status: She is alert.  Psychiatric:        Mood and Affect: Mood normal.        Behavior: Behavior normal.     LABORATORY DATA:  CBC    Component Value Date/Time   WBC 9.9 03/18/2018 0457   RBC 3.76 (L) 03/18/2018 0457   HGB 11.4 (L) 03/18/2018 0457   HGB 12.2 12/17/2014 1039   HCT 33.7 (L) 03/18/2018 0457   HCT 37.3 12/17/2014 1039   PLT 242 03/18/2018 0457   PLT 290 12/17/2014 1039   MCV 89.6 03/18/2018 0457   MCV 93.1 12/17/2014 1039   MCH 30.3 03/18/2018 0457   MCHC 33.8 03/18/2018 0457   RDW 14.2 03/18/2018 0457   RDW 15.6 (H) 12/17/2014 1039   LYMPHSABS 1.3 12/17/2014 1039   MONOABS 0.3 12/17/2014 1039   EOSABS 0.1 12/17/2014 1039   BASOSABS 0.0 12/17/2014 1039    CMP     Component Value Date/Time   NA 140 03/18/2018 0457   NA 142 12/17/2014 1039   K 3.6 03/18/2018 0457   K 3.5 12/17/2014 1039   CL 103 03/18/2018 0457   CL 101 04/24/2013 0904   CO2 28 03/18/2018 0457   CO2 31 (H)  12/17/2014 1039   GLUCOSE 150 (H) 03/18/2018 0457   GLUCOSE 108 12/17/2014 1039   GLUCOSE 116 (H) 04/24/2013 0904   BUN 20 03/18/2018 0457   BUN 18.3 12/17/2014 1039   CREATININE 0.84 03/18/2018 0457   CREATININE 0.98 02/22/2015 0935   CREATININE 0.9 12/17/2014 1039   CALCIUM 9.1 03/18/2018 0457   CALCIUM 10.2 12/17/2014 1039   PROT 7.1 02/22/2015 0935   PROT 7.2 12/17/2014 1039   ALBUMIN 4.2 02/22/2015 0935   ALBUMIN 3.9 12/17/2014 1039   AST 18 02/22/2015 0935   AST 16 12/17/2014 1039   ALT 19 02/22/2015 0935   ALT 20 12/17/2014 1039   ALKPHOS 71 02/22/2015 0935   ALKPHOS 77 12/17/2014 1039   BILITOT 0.5 02/22/2015 0935   BILITOT 0.37 12/17/2014 1039   GFRNONAA >60 03/18/2018 0457   GFRAA >60  03/18/2018 0457       PENDING LABS:   RADIOGRAPHIC STUDIES:  No results found.   PATHOLOGY:     ASSESSMENT and THERAPY PLAN:   History of right breast cancer [name] is an 82 year old woman with history of right-sided breast cancer status post neoadjuvant chemotherapy followed by lumpectomy, adjuvant radiation, and 7 years of antiestrogen therapy with letrozole that she completed in August 2020.  History of right sided breast cancer: She has no clinical or radiographic signs of breast cancer recurrence.  She will continue with annual bilateral screening 3D mammograms next due in June 2025.  She knows to let me know if she develops any new symptoms or concerns for breast cancer recurrence and we will see her sooner than 1 year for follow-up. Genetic testing: She underwent updated genetic testing that was negative.  I reached out to our geneticist to discuss the question about whether gene connect would be of benefit due to it screening for familial cholesterol issues. Health maintenance: Soleia has excelled at her social connectedness particularly at this stage in her life.  She is also very active which is great.  I applauded her for her work and this along with her staying up-to-date with her primary care provider and GYN.  I recommended that she see the providers on her care team regularly.  We discussed healthy diet and the suggestion to modify that somewhat to work to lose some weight. Hypertension: Her blood pressure today is elevated at 154/72.  She has no symptoms.  She is going to get batteries on her way home so she can check her blood pressure at home and will follow-up with her primary care if it continues to stay elevated.  Reneisha will return to see [place] in 1 year for continued long-term follow-up.  She knows to call for any questions or concerns that may arise between now and her next visit.   All questions were answered. The patient knows to call the clinic with any problems,  questions or concerns. We can certainly see the patient much sooner if necessary.  Total encounter time:25 minutes*in face-to-face visit time, chart review, lab review, care coordination, order entry, and documentation of the encounter time.    Lillard Anes, NP 09/20/23 10:49 AM Medical Oncology and Hematology [facility] [address] [place], [place] [address] Tel. [phone]    Fax. [phone]  *Total Encounter Time as defined by the Centers for Medicare and Medicaid Services includes, in addition to the face-to-face time of a patient visit (documented in the note above) non-face-to-face time: obtaining and reviewing outside history, ordering and reviewing medications, tests or  procedures, care coordination (communications with other health care professionals or caregivers) and documentation in the medical record.

## 2023-09-21 ENCOUNTER — Encounter: Payer: Self-pay | Admitting: Gastroenterology

## 2023-09-27 DIAGNOSIS — N183 Chronic kidney disease, stage 3 unspecified: Secondary | ICD-10-CM | POA: Diagnosis not present

## 2023-09-27 DIAGNOSIS — Z0000 Encounter for general adult medical examination without abnormal findings: Secondary | ICD-10-CM | POA: Diagnosis not present

## 2023-09-27 DIAGNOSIS — Z1389 Encounter for screening for other disorder: Secondary | ICD-10-CM | POA: Diagnosis not present

## 2023-10-11 DIAGNOSIS — Z961 Presence of intraocular lens: Secondary | ICD-10-CM | POA: Diagnosis not present

## 2023-10-11 DIAGNOSIS — H43813 Vitreous degeneration, bilateral: Secondary | ICD-10-CM | POA: Diagnosis not present

## 2023-10-11 DIAGNOSIS — R7303 Prediabetes: Secondary | ICD-10-CM | POA: Diagnosis not present

## 2023-10-16 DIAGNOSIS — G4733 Obstructive sleep apnea (adult) (pediatric): Secondary | ICD-10-CM | POA: Diagnosis not present

## 2023-10-20 DIAGNOSIS — M0609 Rheumatoid arthritis without rheumatoid factor, multiple sites: Secondary | ICD-10-CM | POA: Diagnosis not present

## 2023-10-20 DIAGNOSIS — E785 Hyperlipidemia, unspecified: Secondary | ICD-10-CM | POA: Diagnosis not present

## 2023-10-20 DIAGNOSIS — Z79899 Other long term (current) drug therapy: Secondary | ICD-10-CM | POA: Diagnosis not present

## 2023-10-20 DIAGNOSIS — J449 Chronic obstructive pulmonary disease, unspecified: Secondary | ICD-10-CM | POA: Diagnosis not present

## 2023-10-20 DIAGNOSIS — E669 Obesity, unspecified: Secondary | ICD-10-CM | POA: Diagnosis not present

## 2023-10-20 DIAGNOSIS — I10 Essential (primary) hypertension: Secondary | ICD-10-CM | POA: Diagnosis not present

## 2023-10-20 DIAGNOSIS — M1990 Unspecified osteoarthritis, unspecified site: Secondary | ICD-10-CM | POA: Diagnosis not present

## 2023-10-20 DIAGNOSIS — M25551 Pain in right hip: Secondary | ICD-10-CM | POA: Diagnosis not present

## 2023-11-16 DIAGNOSIS — G4733 Obstructive sleep apnea (adult) (pediatric): Secondary | ICD-10-CM | POA: Diagnosis not present

## 2023-11-24 ENCOUNTER — Other Ambulatory Visit: Payer: Self-pay | Admitting: *Deleted

## 2023-11-24 DIAGNOSIS — J4530 Mild persistent asthma, uncomplicated: Secondary | ICD-10-CM

## 2023-11-25 ENCOUNTER — Ambulatory Visit: Payer: PPO | Admitting: Adult Health

## 2023-11-25 ENCOUNTER — Ambulatory Visit: Payer: PPO | Admitting: Pulmonary Disease

## 2023-11-25 DIAGNOSIS — J4530 Mild persistent asthma, uncomplicated: Secondary | ICD-10-CM | POA: Diagnosis not present

## 2023-11-25 LAB — PULMONARY FUNCTION TEST
DL/VA % pred: 98 %
DL/VA: 3.97 ml/min/mmHg/L
DLCO cor % pred: 78 %
DLCO cor: 15.2 ml/min/mmHg
DLCO unc % pred: 78 %
DLCO unc: 15.2 ml/min/mmHg
FEF 25-75 Post: 1.61 L/s
FEF 25-75 Pre: 2.09 L/s
FEF2575-%Change-Post: -22 %
FEF2575-%Pred-Post: 109 %
FEF2575-%Pred-Pre: 142 %
FEV1-%Change-Post: -5 %
FEV1-%Pred-Post: 90 %
FEV1-%Pred-Pre: 95 %
FEV1-Post: 1.84 L
FEV1-Pre: 1.95 L
FEV1FVC-%Change-Post: 2 %
FEV1FVC-%Pred-Pre: 110 %
FEV6-%Change-Post: -7 %
FEV6-%Pred-Post: 85 %
FEV6-%Pred-Pre: 92 %
FEV6-Post: 2.21 L
FEV6-Pre: 2.4 L
FEV6FVC-%Pred-Post: 105 %
FEV6FVC-%Pred-Pre: 105 %
FVC-%Change-Post: -7 %
FVC-%Pred-Post: 81 %
FVC-%Pred-Pre: 87 %
FVC-Post: 2.21 L
FVC-Pre: 2.4 L
Post FEV1/FVC ratio: 83 %
Post FEV6/FVC ratio: 100 %
Pre FEV1/FVC ratio: 81 %
Pre FEV6/FVC Ratio: 100 %
RV % pred: 76 %
RV: 1.87 L
TLC % pred: 79 %
TLC: 4.12 L

## 2023-11-25 NOTE — Progress Notes (Signed)
Full PFT performed today. °

## 2023-11-25 NOTE — Patient Instructions (Signed)
Full PFT performed today. °

## 2023-12-28 ENCOUNTER — Encounter: Payer: Self-pay | Admitting: Adult Health

## 2023-12-28 ENCOUNTER — Ambulatory Visit: Payer: PPO | Admitting: Adult Health

## 2023-12-28 VITALS — BP 134/74 | HR 100 | Temp 98.3°F | Ht 64.5 in | Wt 213.0 lb

## 2023-12-28 DIAGNOSIS — J4530 Mild persistent asthma, uncomplicated: Secondary | ICD-10-CM | POA: Diagnosis not present

## 2023-12-28 DIAGNOSIS — G4730 Sleep apnea, unspecified: Secondary | ICD-10-CM

## 2023-12-28 DIAGNOSIS — G4733 Obstructive sleep apnea (adult) (pediatric): Secondary | ICD-10-CM

## 2023-12-28 NOTE — Patient Instructions (Addendum)
 Wear CPAP At bedtime, wear all night long for at least 6hr or more .  Healthy sleep regimen  Work on healthy weight  Saline nasal spray As needed   Set up visit with [facility] to go over your CPAP.  Download in 1 month.   Continue on Advair 1 puff Twice daily , rinse after use  Continue on Spiriva  daily.  Albuterol inhaler As needed   Activity as tolerated.  Follow up in 6 months with Dr. Neda and As needed  (30 min slot)

## 2023-12-28 NOTE — Assessment & Plan Note (Signed)
 Moderate obstructive sleep apnea with significant symptom burden.  Patient does have perceived benefit on CPAP.  Has been having trouble utilizing her machine.  I have advised her to set up a visit with her DME company evaluate her machine.  Order to DME sent. Check download in 1 month.  Plan  Patient Instructions  Wear CPAP At bedtime, wear all night long for at least 6hr or more .  Healthy sleep regimen  Work on healthy weight  Saline nasal spray As needed   Set up visit with [facility] to go over your CPAP.  Download in 1 month.   Continue on Advair 1 puff Twice daily , rinse after use  Continue on Spiriva  daily.  Albuterol inhaler As needed   Activity as tolerated.  Follow up in 6 months with Dr. Neda and As needed  (30 min slot)

## 2023-12-28 NOTE — Progress Notes (Signed)
 @Patient  ID: [name], female    DOB: March 11, 1941, 83 y.o.   MRN: [id number]  Chief Complaint  Patient presents with   Follow-up    Referring provider: Shayne Anes, MD  HPI: 83 year old female never smoker seen for consult July 19, 2023 to establish for sleep apnea and asthma Medical history significant for rheumatoid arthritis, history of breast cancer status postlumpectomy, chemo and radiation  TEST/EVENTS :  Home sleep study August 03, 2023 showed moderate sleep apnea with AHI 26.8/hour and SpO2 low at 71%.  12/28/2023 Follow up : OSA and Asthma  Patient presents for a 4-month follow-up.  Patient had a repeat home sleep study August 03, 2023.  This showed moderate sleep apnea.  She was recommended to restart on CPAP therapy.  Since last visit patient says she is having trouble using the CPAP.  Says that she did not get great instruction on how to use a CPAP.  Says her machine turns on and off.  Sometimes has a big leak.  She says she is taking it back up to the homecare company but they have not been very helpful.  She says when she did wear it it felt much better felt like she slept better.  Wants to restart and use every night. CPAP download shows 47% compliance.  Daily average usage at 6.5 hours.  Patient is on auto CPAP 5 to 15 cm H2O.  Positive mask leaks.  AHI 3.7/hour.  Daily average pressure at 14.5 cm H2O.  She is currently using a nasal mask.   Patient has mild persistent asthma.  Says she is doing well.  Remains on Advair and Spiriva .  Says her breathing has been doing fairly well.  Denies a flare of cough or wheezing.  No increased albuterol use.  Chest x-ray July 19, 2023 showed no acute process.  Some coarsened interstitial markings.  PFTs were done November 25, 2023 that showed no significant airflow obstruction or restriction.  FEV1 at 90%, ratio 83, FVC 81%, no significant bronchodilator response, DLCO slightly decreased at 78%.   Allergies  Allergen Reactions    Crestor  [Rosuvastatin ] Other (See Comments)    crampin of legs and feet--pt is currently taking.   Lactose Intolerance (Gi) Other (See Comments)    ABD. CRAMPS   Lipitor [Atorvastatin Calcium ] Other (See Comments)    LEG CRAMPS   Zocor [Simvastatin - High Dose] Other (See Comments)    Leg cramps    Immunization History  Administered Date(s) Administered   Fluad  Trivalent(High Dose 65+) 09/20/2023   Influenza, High Dose Seasonal PF 09/30/2016, 09/27/2019   Influenza-Unspecified 09/20/2017, 10/07/2022   PFIZER Comirnaty (Gray Top)Covid-19 Tri-Sucrose Vaccine 05/07/2021   PFIZER(Purple Top)SARS-COV-2 Vaccination 09/02/2020   Pfizer Covid-19 Vaccine Bivalent Booster 27yrs & up 12/24/2021   Pfizer(Comirnaty )Fall Seasonal Vaccine 12 years and older 10/06/2022, 09/20/2023   Respiratory Syncytial Virus Vaccine ,Recomb Aduvanted(Arexvy ) 11/23/2022   Zoster Recombinant(Shingrix ) 09/04/2021, 11/06/2021    Past Medical History:  Diagnosis Date   Anemia    history of   Arthritis    osteo - s/p right total knee   Asthma    daily and prn inhalers   Boil, axilla    Breast cancer (HCC) 10/28/11   right breast   Breast cancer of upper-outer quadrant of right female breast (HCC) 11/04/2011   2.5 x 2.4 x 1.7 cm biopsy-proven invasive ductal carcinoma and ductal carcinoma in situ in the anterior aspect of the upper outer  quadrant of the right breast. There are  associated linear and nodular enhancing masses extending from this mass to the  nipple/areolar complex with abnormal thickening and enhancement of the nipple and areola, compatible with extension of malignancy into  the nipple/ar   CAD (coronary artery disease)    Colon polyp    GERD (gastroesophageal reflux disease)    Grave's disease    no current meds.   H pylori ulcer    Heart murmur    History of blood transfusion 2006   History of chemotherapy    neoadjuvant: 5 cycles of Taxol/carboplatinum:  last dose 02/24/12   History of hiatal  hernia    Hyperlipidemia    Hypertension    under control with med.   Hypothyroid    Liver cyst    History of    Pre-diabetes    RA (rheumatoid arthritis) (HCC)    S/P chemotherapy, time since 4-12 weeks finished 02/20/2011   S/P radiation therapy    Right Breast High Axilla and Supraclavicular gegion: 4500 cGy/25 Fractions with boost for a total of 6300 cGy   Sleep apnea sleep study 09/18/2005   uses CPAP occ. - to bring machine DOS   Spondylosis    cervical and lumbar   Use of letrozole  (Femara ) start 07/21/12    Tobacco History: Social History   Tobacco Use  Smoking Status Never  Smokeless Tobacco Never   Counseling given: Not Answered   Outpatient Medications Prior to Visit  Medication Sig Dispense Refill   aspirin  81 MG EC tablet Take 81 mg by mouth daily.     Cholecalciferol  (VITAMIN D -3) 5000 units TABS Take 5,000 Units by mouth daily.     Cyanocobalamin (B-12) 1000 MCG TABS Take 1,000 mcg by mouth daily.     diclofenac sodium (VOLTAREN) 1 % GEL Apply 2-4 g topically 4 (four) times daily as needed. For joint pain.  3   Evolocumab (REPATHA SURECLICK) 140 MG/ML SOAJ as directed Subcutaneous every two weeks as directed 140 mg sub q with each dose for 90 days     ezetimibe  (ZETIA ) 10 MG tablet Take 10 mg by mouth every evening.      Fluticasone-Salmeterol (ADVAIR) 100-50 MCG/DOSE AEPB Inhale 1 puff into the lungs daily.      folic acid  (FOLVITE ) 1 MG tablet Take 1 mg by mouth daily.     gabapentin  (NEURONTIN ) 100 MG capsule Take 3 capsules (300 mg total) by mouth at bedtime. (Patient taking differently: Take 100 mg by mouth at bedtime.)     HYDROcodone  bit-homatropine (HYCODAN) 5-1.5 MG/5ML syrup Take 5 mLs by mouth every 6 (six) hours as needed.     ipratropium (ATROVENT HFA) 17 MCG/ACT inhaler Inhale 2 puffs into the lungs every 6 (six) hours.     levothyroxine  (SYNTHROID , LEVOTHROID) 75 MCG tablet Take 75 mcg by mouth daily before breakfast.   2   Methotrexate (XATMEP PO)  Take 0.6 mg by mouth once a week.     omeprazole  (PRILOSEC) 20 MG capsule Take 20 mg by mouth daily.     RSV vaccine recomb adjuvanted (AREXVY ) 120 MCG/0.5ML injection Inject into the muscle. 0.5 mL 0   TIADYLT  ER 360 MG 24 hr capsule Take 360 mg by mouth at bedtime.     Tiotropium Bromide  Monohydrate (SPIRIVA  RESPIMAT IN) Spiriva  with HandiHaler     traMADol  (ULTRAM ) 50 MG tablet Take by mouth every 6 (six) hours as needed.     triamterene -hydrochlorothiazide  (MAXZIDE -25) 37.5-25 MG tablet TAKE ONE TABLET BY MOUTH EVERY MORNING 90  tablet 3   No facility-administered medications prior to visit.     Review of Systems:   Constitutional:   No  weight loss, night sweats,  Fevers, chills, +fatigue, or  lassitude.  HEENT:   No headaches,  Difficulty swallowing,  Tooth/dental problems, or  Sore throat,                No sneezing, itching, ear ache, nasal congestion, post nasal drip,   CV:  No chest pain,  Orthopnea, PND, swelling in lower extremities, anasarca, dizziness, palpitations, syncope.   GI  No heartburn, indigestion, abdominal pain, nausea, vomiting, diarrhea, change in bowel habits, loss of appetite, bloody stools.   Resp: No shortness of breath with exertion or at rest.  No excess mucus, no productive cough,  No non-productive cough,  No coughing up of blood.  No change in color of mucus.  No wheezing.  No chest wall deformity  Skin: no rash or lesions.  GU: no dysuria, change in color of urine, no urgency or frequency.  No flank pain, no hematuria   MS:  No joint pain or swelling.  No decreased range of motion.  No back pain.    Physical Exam  BP 134/74 (BP Location: Left Arm, Patient Position: Sitting, Cuff Size: Normal)   Pulse 100   Temp 98.3 F (36.8 C) (Oral)   Ht 5' 4.5 (1.638 m)   Wt 213 lb (96.6 kg)   SpO2 97%   BMI 36.00 kg/m   GEN: A/Ox3; pleasant , NAD, well nourished    HEENT:  [place]/AT, , NOSE-clear, THROAT-clear, no lesions, no postnasal drip or  exudate noted.   NECK:  Supple w/ fair ROM; no JVD; normal carotid impulses w/o bruits; no thyromegaly or nodules palpated; no lymphadenopathy.    RESP  Clear  P & A; w/o, wheezes/ rales/ or rhonchi. no accessory muscle use, no dullness to percussion  CARD:  RRR, no m/r/g, tr peripheral edema, pulses intact, no cyanosis or clubbing.  GI:   Soft & nt; nml bowel sounds; no organomegaly or masses detected.   Musco: Warm bil, no deformities or joint swelling noted.   Neuro: alert, no focal deficits noted.    Skin: Warm, no lesions or rashes    Lab Results:  CBC   BMET   BNP   ProBNP No results found for: PROBNP  Imaging: No results found.  Administration History     None          Latest Ref Rng & Units 11/25/2023    2:38 PM  PFT Results  FVC-Pre L 2.40   FVC-Predicted Pre % 87   FVC-Post L 2.21   FVC-Predicted Post % 81   Pre FEV1/FVC % % 81   Post FEV1/FCV % % 83   FEV1-Pre L 1.95   FEV1-Predicted Pre % 95   FEV1-Post L 1.84   DLCO uncorrected ml/min/mmHg 15.20   DLCO UNC% % 78   DLCO corrected ml/min/mmHg 15.20   DLCO COR %Predicted % 78   DLVA Predicted % 98   TLC L 4.12   TLC % Predicted % 79   RV % Predicted % 76     No results found for: NITRICOXIDE      Assessment & Plan:   Asthma Currently well-controlled-continue on current maintenance regimen PFTs reviewed-lung function shows no airflow obstruction or restriction.  Diffusing capacity slightly decreased.  Patient appears to be doing well.  Continue to monitor.  Plan  Patient Instructions  Wear CPAP At bedtime, wear all night long for at least 6hr or more .  Healthy sleep regimen  Work on healthy weight  Saline nasal spray As needed   Set up visit with [facility] to go over your CPAP.  Download in 1 month.   Continue on Advair 1 puff Twice daily , rinse after use  Continue on Spiriva  daily.  Albuterol inhaler As needed   Activity as tolerated.  Follow up in 6 months with  Dr. Neda and As needed  (30 min slot)    Sleep apnea Moderate obstructive sleep apnea with significant symptom burden.  Patient does have perceived benefit on CPAP.  Has been having trouble utilizing her machine.  I have advised her to set up a visit with her DME company evaluate her machine.  Order to DME sent. Check download in 1 month.  Plan  Patient Instructions  Wear CPAP At bedtime, wear all night long for at least 6hr or more .  Healthy sleep regimen  Work on healthy weight  Saline nasal spray As needed   Set up visit with [facility] to go over your CPAP.  Download in 1 month.   Continue on Advair 1 puff Twice daily , rinse after use  Continue on Spiriva  daily.  Albuterol inhaler As needed   Activity as tolerated.  Follow up in 6 months with Dr. Neda and As needed  (30 min slot)    Severe obesity (BMI 35.0-35.9 with comorbidity) (HCC) Encouraged on healthy weight loss     Madelin Stank, NP 12/28/2023

## 2023-12-28 NOTE — Assessment & Plan Note (Signed)
Encouraged on healthy weight loss 

## 2023-12-28 NOTE — Assessment & Plan Note (Addendum)
 Currently well-controlled-continue on current maintenance regimen PFTs reviewed-lung function shows no airflow obstruction or restriction.  Diffusing capacity slightly decreased.  Patient appears to be doing well.  Continue to monitor.  Plan  Patient Instructions  Wear CPAP At bedtime, wear all night long for at least 6hr or more .  Healthy sleep regimen  Work on healthy weight  Saline nasal spray As needed   Set up visit with [facility] to go over your CPAP.  Download in 1 month.   Continue on Advair 1 puff Twice daily , rinse after use  Continue on Spiriva  daily.  Albuterol inhaler As needed   Activity as tolerated.  Follow up in 6 months with Dr. Neda and As needed  (30 min slot)

## 2023-12-30 DIAGNOSIS — G4733 Obstructive sleep apnea (adult) (pediatric): Secondary | ICD-10-CM | POA: Diagnosis not present

## 2024-01-04 ENCOUNTER — Other Ambulatory Visit: Payer: Self-pay | Admitting: Internal Medicine

## 2024-01-04 DIAGNOSIS — M4802 Spinal stenosis, cervical region: Secondary | ICD-10-CM

## 2024-01-04 DIAGNOSIS — M542 Cervicalgia: Secondary | ICD-10-CM

## 2024-01-06 ENCOUNTER — Other Ambulatory Visit: Payer: PPO

## 2024-01-12 NOTE — Discharge Instructions (Signed)

## 2024-01-13 ENCOUNTER — Ambulatory Visit
Admission: RE | Admit: 2024-01-13 | Discharge: 2024-01-13 | Disposition: A | Discharge status: Home / Self Care | Payer: PPO | Source: Ambulatory Visit | Attending: Internal Medicine | Admitting: Internal Medicine

## 2024-01-13 DIAGNOSIS — M4802 Spinal stenosis, cervical region: Secondary | ICD-10-CM

## 2024-01-13 DIAGNOSIS — M542 Cervicalgia: Secondary | ICD-10-CM

## 2024-01-13 DIAGNOSIS — M4722 Other spondylosis with radiculopathy, cervical region: Secondary | ICD-10-CM | POA: Diagnosis not present

## 2024-01-13 MED ORDER — IOPAMIDOL (ISOVUE-M 300) INJECTION 61%
1.0000 mL | Freq: Once | INTRAMUSCULAR | Status: AC
  Administered 2024-01-13: 1 mL via EPIDURAL

## 2024-01-13 MED ORDER — TRIAMCINOLONE ACETONIDE 40 MG/ML IJ SUSP (RADIOLOGY)
60.0000 mg | Freq: Once | INTRAMUSCULAR | Status: AC
  Administered 2024-01-13: 60 mg via EPIDURAL

## 2024-01-16 DIAGNOSIS — G4733 Obstructive sleep apnea (adult) (pediatric): Secondary | ICD-10-CM | POA: Diagnosis not present

## 2024-01-18 ENCOUNTER — Ambulatory Visit: Payer: PPO | Admitting: Gastroenterology

## 2024-01-20 ENCOUNTER — Ambulatory Visit: Payer: PPO | Admitting: Gastroenterology

## 2024-01-20 ENCOUNTER — Encounter: Payer: Self-pay | Admitting: Gastroenterology

## 2024-01-24 DIAGNOSIS — E785 Hyperlipidemia, unspecified: Secondary | ICD-10-CM | POA: Diagnosis not present

## 2024-01-24 DIAGNOSIS — E1169 Type 2 diabetes mellitus with other specified complication: Secondary | ICD-10-CM | POA: Diagnosis not present

## 2024-01-24 DIAGNOSIS — E039 Hypothyroidism, unspecified: Secondary | ICD-10-CM | POA: Diagnosis not present

## 2024-01-24 DIAGNOSIS — I129 Hypertensive chronic kidney disease with stage 1 through stage 4 chronic kidney disease, or unspecified chronic kidney disease: Secondary | ICD-10-CM | POA: Diagnosis not present

## 2024-01-24 DIAGNOSIS — E0500 Thyrotoxicosis with diffuse goiter without thyrotoxic crisis or storm: Secondary | ICD-10-CM | POA: Diagnosis not present

## 2024-01-24 DIAGNOSIS — D649 Anemia, unspecified: Secondary | ICD-10-CM | POA: Diagnosis not present

## 2024-01-24 DIAGNOSIS — N1831 Chronic kidney disease, stage 3a: Secondary | ICD-10-CM | POA: Diagnosis not present

## 2024-01-24 DIAGNOSIS — E559 Vitamin D deficiency, unspecified: Secondary | ICD-10-CM | POA: Diagnosis not present

## 2024-01-24 LAB — LAB REPORT - SCANNED
EGFR: 57.7
Free T4: 0.8 ng/dL
TSH: 20.19 — AB (ref 0.41–5.90)

## 2024-01-24 LAB — HEMOGLOBIN A1C: A1c: 5.9

## 2024-01-31 DIAGNOSIS — R82998 Other abnormal findings in urine: Secondary | ICD-10-CM | POA: Diagnosis not present

## 2024-01-31 DIAGNOSIS — E1169 Type 2 diabetes mellitus with other specified complication: Secondary | ICD-10-CM | POA: Diagnosis not present

## 2024-01-31 DIAGNOSIS — R700 Elevated erythrocyte sedimentation rate: Secondary | ICD-10-CM | POA: Diagnosis not present

## 2024-01-31 DIAGNOSIS — I129 Hypertensive chronic kidney disease with stage 1 through stage 4 chronic kidney disease, or unspecified chronic kidney disease: Secondary | ICD-10-CM | POA: Diagnosis not present

## 2024-01-31 DIAGNOSIS — I2510 Atherosclerotic heart disease of native coronary artery without angina pectoris: Secondary | ICD-10-CM | POA: Diagnosis not present

## 2024-01-31 DIAGNOSIS — M069 Rheumatoid arthritis, unspecified: Secondary | ICD-10-CM | POA: Diagnosis not present

## 2024-01-31 DIAGNOSIS — E039 Hypothyroidism, unspecified: Secondary | ICD-10-CM | POA: Diagnosis not present

## 2024-01-31 DIAGNOSIS — G609 Hereditary and idiopathic neuropathy, unspecified: Secondary | ICD-10-CM | POA: Diagnosis not present

## 2024-01-31 DIAGNOSIS — G4733 Obstructive sleep apnea (adult) (pediatric): Secondary | ICD-10-CM | POA: Diagnosis not present

## 2024-01-31 DIAGNOSIS — C50919 Malignant neoplasm of unspecified site of unspecified female breast: Secondary | ICD-10-CM | POA: Diagnosis not present

## 2024-01-31 DIAGNOSIS — N1831 Chronic kidney disease, stage 3a: Secondary | ICD-10-CM | POA: Diagnosis not present

## 2024-01-31 DIAGNOSIS — Z0000 Encounter for general adult medical examination without abnormal findings: Secondary | ICD-10-CM | POA: Diagnosis not present

## 2024-02-16 DIAGNOSIS — G4733 Obstructive sleep apnea (adult) (pediatric): Secondary | ICD-10-CM | POA: Diagnosis not present

## 2024-02-29 ENCOUNTER — Encounter: Payer: Self-pay | Admitting: Gastroenterology

## 2024-02-29 ENCOUNTER — Ambulatory Visit: Payer: PPO | Admitting: Gastroenterology

## 2024-02-29 VITALS — BP 130/70 | HR 70 | Ht 64.5 in | Wt 214.6 lb

## 2024-02-29 DIAGNOSIS — K219 Gastro-esophageal reflux disease without esophagitis: Secondary | ICD-10-CM

## 2024-02-29 DIAGNOSIS — Z860100 Personal history of colon polyps, unspecified: Secondary | ICD-10-CM

## 2024-02-29 DIAGNOSIS — R1310 Dysphagia, unspecified: Secondary | ICD-10-CM | POA: Diagnosis not present

## 2024-02-29 DIAGNOSIS — K5900 Constipation, unspecified: Secondary | ICD-10-CM

## 2024-02-29 DIAGNOSIS — Z860101 Personal history of adenomatous and serrated colon polyps: Secondary | ICD-10-CM | POA: Diagnosis not present

## 2024-02-29 MED ORDER — SUFLAVE 178.7 G PO SOLR: 1.0000 | Freq: Once | ORAL | 0 refills | Status: AC

## 2024-02-29 MED ORDER — OMEPRAZOLE 40 MG PO CPDR: 40.0000 mg | DELAYED_RELEASE_CAPSULE | Freq: Every day | ORAL | 3 refills | Status: DC

## 2024-02-29 NOTE — Progress Notes (Signed)
 Chief Complaint: recall colonoscopy Primary GI MD: Dr. Lavon Paganini  HPI: 83 year old female history of GERD, dyspepsia, excessive belching, chronic constipation, CAD, RA, breast cancer s/p chemoradiation presents for evaluation of recall colonoscopy  Last seen in 2019 by Dr. Lavon Paganini  Discussed the use of AI scribe software for clinical note transcription with the patient, who gave verbal consent to proceed.  History of Present Illness   The patient presents with dysphagia and heartburn for follow-up endoscopy and colonoscopy.  She has been experiencing dysphagia with worsening symptoms, including pain when eating and a sensation of food getting stuck, particularly in her upper back. The pain is severe, rated between eight and ten on a scale. She recalls similar symptoms in 2013 when her esophagus was stretched, which provided significant relief at the time. She is currently taking omeprazole 20 mg, which she feels might help somewhat, but notes that it is not very effective. She also takes aspirin daily.  She experiences heartburn and belching, which she associates with her current gastrointestinal issues. She mentions that she used to take Prevacid but switched to omeprazole because it is cheaper. Despite the medication, her symptoms are not well controlled.  She has been struggling with constipation, which she attributes to a TSH level of 20 (being followed by PCP with recheck coming up). Her medication for this has been increased, and she has an appointment in April for a follow-up test. The constipation is described as intermittent, having been noticeable in the last two to three days. She has Miralax at home, which she acknowledges works when she takes it consistently.   Denies weight loss, rectal bleeding, melena.  Recent labs reviewed. Overall normal with normal hgb and iron studies but noted to have a mild iron deficiency with ferritin of 22.  PREVIOUS GI WORKUP   Abdominal  ultrasound: July 2013 No significant abnormality   Colonoscopy September 26, 2018 diverticulosis, internal hemorrhoids and multiple sessile adenomatous polyps.  She was recommended surveillance colonoscopy in 3 years.  EGD in 2013 for dysphagia Normal EGD S/p passage of 55F Maloney dilator  Past Medical History:  Diagnosis Date   Anemia    history of   Arthritis    osteo - s/p right total knee   Asthma    daily and prn inhalers   Boil, axilla    Breast cancer (HCC) 10/28/11   right breast   Breast cancer of upper-outer quadrant of right female breast (HCC) 11/04/2011   2.5 x 2.4 x 1.7 cm biopsy-proven invasive ductal carcinoma and ductal carcinoma in situ in the anterior aspect of the upper outer  quadrant of the right breast. There are associated linear and nodular enhancing masses extending from this mass to the  nipple/areolar complex with abnormal thickening and enhancement of the nipple and areola, compatible with extension of malignancy into  the nipple/ar   CAD (coronary artery disease)    Colon polyp    GERD (gastroesophageal reflux disease)    Grave's disease    no current meds.   H pylori ulcer    Heart murmur    History of blood transfusion 2006   History of chemotherapy    neoadjuvant: 5 cycles of Taxol/carboplatinum:  last dose 02/24/12   History of hiatal hernia    Hyperlipidemia    Hypertension    under control with med.   Hypothyroid    Liver cyst    History of    Pre-diabetes    RA (rheumatoid arthritis) (HCC)  S/P chemotherapy, time since 4-12 weeks finished 02/20/2011   S/P radiation therapy    Right Breast High Axilla and Supraclavicular gegion: 4500 cGy/25 Fractions with boost for a total of 6300 cGy   Sleep apnea sleep study 09/18/2005   uses CPAP occ. - to bring machine DOS   Spondylosis    cervical and lumbar   Use of letrozole (Femara) start 07/21/12    Past Surgical History:  Procedure Laterality Date   ABDOMINAL HYSTERECTOMY     BILATERAL  OOPHORECTOMY  2009   Boil removal under arm Right    BREAST BIOPSY Bilateral 1998   BREAST BIOPSY Right 08/11/2018   FIBROADENOMATOID NODULE WITH CALCIFICATIONS    BREAST LUMPECTOMY Right 03/2012   CARDIAC CATHETERIZATION  04/10/2010, 05/07/2004   LAD lesion, blood flow OK, per pt.   CARDIOVASCULAR STRESS TEST  02/15/2018   CARPAL TUNNEL RELEASE     left   CARPAL TUNNEL RELEASE Right    CATARACT EXTRACTION, BILATERAL     COLONOSCOPY  12/20/2012   Procedure: COLONOSCOPY;  Surgeon: Hart Carwin, MD;  Location: [facility];  Service: Endoscopy;  Laterality: N/A;   ESOPHAGOGASTRODUODENOSCOPY  12/20/2012   Procedure: ESOPHAGOGASTRODUODENOSCOPY (EGD);  Surgeon: Hart Carwin, MD;  Location: [facility];  Service: Endoscopy;  Laterality: N/A;   LESION REMOVAL  11/27/2011   Procedure: LESION REMOVAL;  Surgeon: Ernestene Mention, MD;  Location: [facility];  Service: General;  Laterality: Right;  Skin tag removed from under right eye. No specimen   LUMBAR LAMINECTOMY     back surg. x 2 - 1980's and 1999   NM MYOVIEW LTD  08/02/2012   no ischemia   PARTIAL HYSTERECTOMY     BSO   PORTACATH PLACEMENT  11/27/2011   Procedure: INSERTION PORT-A-CATH;  Surgeon: Ernestene Mention, MD;  Location: [facility];  Service: General;  Laterality: Right;   TOTAL HIP ARTHROPLASTY Right 03/17/2018   Procedure: RIGHT TOTAL HIP ARTHROPLASTY ANTERIOR APPROACH;  Surgeon: Samson Frederic, MD;  Location: [facility];  Service: Orthopedics;  Laterality: Right;  Needs RNFA   TOTAL KNEE ARTHROPLASTY  07/28/2006   right   US ECHOCARDIOGRAPHY  08/14/2008   technically difficult-mild LVH, EF =>55%,mild annular ca+,AOV mildly sclerotic    Current Outpatient Medications  Medication Sig Dispense Refill   albuterol (VENTOLIN HFA) 108 (90 Base) MCG/ACT inhaler Inhale 2 puffs into the lungs every 4 (four) hours as needed.     aspirin 81 MG EC tablet Take 81 mg by mouth daily.     Cholecalciferol (VITAMIN  D-3) 5000 units TABS Take 5,000 Units by mouth daily.     Cyanocobalamin (B-12) 1000 MCG TABS Take 1,000 mcg by mouth daily.     diclofenac sodium (VOLTAREN) 1 % GEL Apply 2-4 g topically 4 (four) times daily as needed. For joint pain.  3   Evolocumab (REPATHA SURECLICK) 140 MG/ML SOAJ as directed Subcutaneous every two weeks as directed 140 mg sub q with each dose for 90 days     ezetimibe (ZETIA) 10 MG tablet Take 10 mg by mouth every evening.      Fluticasone-Salmeterol (ADVAIR) 100-50 MCG/DOSE AEPB Inhale 1 puff into the lungs daily.      folic acid (FOLVITE) 1 MG tablet Take 1 mg by mouth daily.     gabapentin (NEURONTIN) 100 MG capsule Take 3 capsules (300 mg total) by mouth at bedtime. (Patient taking differently: Take 100 mg by mouth at bedtime.)  ipratropium (ATROVENT HFA) 17 MCG/ACT inhaler Inhale 2 puffs into the lungs every 6 (six) hours.     levothyroxine (SYNTHROID, LEVOTHROID) 75 MCG tablet Take 75 mcg by mouth daily before breakfast. Taking 150 mcg on Sundays and Saturdays and 75 mcg Monday-Fridays.  2   Methotrexate (XATMEP PO) Take 0.6 mg by mouth once a week.     omeprazole (PRILOSEC) 20 MG capsule Take 20 mg by mouth daily.     TIADYLT ER 360 MG 24 hr capsule Take 360 mg by mouth at bedtime.     Tiotropium Bromide Monohydrate (SPIRIVA RESPIMAT IN) Spiriva with HandiHaler     traMADol (ULTRAM) 50 MG tablet Take by mouth every 6 (six) hours as needed.     triamterene-hydrochlorothiazide (MAXZIDE-25) 37.5-25 MG tablet TAKE ONE TABLET BY MOUTH EVERY MORNING 90 tablet 3   No current facility-administered medications for this visit.    Allergies as of 02/29/2024 - Review Complete 02/29/2024  Allergen Reaction Noted   Crestor [rosuvastatin] Other (See Comments) 05/31/2013   Lactose intolerance (gi) Other (See Comments) 03/29/2012   Lipitor [atorvastatin calcium] Other (See Comments) 03/29/2012   Zocor [simvastatin - high dose] Other (See Comments) 11/04/2011    Family  History  Problem Relation Age of Onset   Pancreatic cancer Paternal Uncle    Cancer Maternal Grandfather        unknown type   Cancer Paternal Grandfather        unknown type   Anesthesia problems Cousin        pt. states she is not aware of any family anes. prob., does not know where this comment came from   Breast cancer Cousin    Ovarian cancer Cousin 40 - 50   Ovarian cancer Cousin    Ovarian cancer Niece 66   Colon cancer Neg Hx    Esophageal cancer Neg Hx    Rectal cancer Neg Hx    Stomach cancer Neg Hx     Social History   Socioeconomic History   Marital status: Single    Spouse name: Not on file   Number of children: Not on file   Years of education: Not on file   Highest education level: Not on file  Occupational History   Occupation: retired    Associate Professor: RETIRED  Tobacco Use   Smoking status: Never   Smokeless tobacco: Never  Vaping Use   Vaping status: Never Used  Substance and Sexual Activity   Alcohol use: No    Alcohol/week: 0.0 standard drinks of alcohol   Drug use: No   Sexual activity: Not Currently    Birth control/protection: Surgical  Other Topics Concern   Not on file  Social History Narrative   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, single, 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      G0   Social Drivers of Corporate investment banker Strain: Not on file  Food Insecurity: Not on file  Transportation Needs: Not on file  Physical Activity: Not on file  Stress: Not on file  Social Connections: Not on file  Intimate Partner Violence: Not on file    Review of Systems:    Constitutional: No weight loss, fever, chills, weakness or fatigue HEENT: Eyes: No change in vision               Ears, Nose, Throat:  No change in hearing or congestion Skin: No rash or itching Cardiovascular: No chest pain, chest pressure or palpitations   Respiratory: No SOB or  cough Gastrointestinal: See HPI and otherwise negative Genitourinary: No dysuria or change in urinary  frequency Neurological: No headache, dizziness or syncope Musculoskeletal: No new muscle or joint pain Hematologic: No bleeding or bruising Psychiatric: No history of depression or anxiety    Physical Exam:  Vital signs: BP 130/70 (BP Location: Left Arm, Patient Position: Sitting, Cuff Size: Normal)   Pulse 70   Ht 5' 4.5" (1.638 m)   Wt 214 lb 9.6 oz (97.3 kg)   BMI 36.27 kg/m   Constitutional: NAD, Well developed, Well nourished, alert and cooperative. Appears significantly younger than stated age Head:  Normocephalic and atraumatic. Eyes:   PEERL, EOMI. No icterus. Conjunctiva pink. Respiratory: Respirations even and unlabored. Lungs clear to auscultation bilaterally.   No wheezes, crackles, or rhonchi.  Cardiovascular:  Regular rate and rhythm. No peripheral edema, cyanosis or pallor.  Gastrointestinal:  Soft, nondistended, nontender. No rebound or guarding. Normal bowel sounds. No appreciable masses or hepatomegaly. Rectal:  Not performed.  Msk:  Symmetrical without gross deformities. Without edema, no deformity or joint abnormality.  Neurologic:  Alert and  oriented x4;  grossly normal neurologically.  Skin:   Dry and intact without significant lesions or rashes. Psychiatric: Oriented to person, place and time. Demonstrates good judgement and reason without abnormal affect or behaviors.  RELEVANT LABS AND IMAGING: CBC    Component Value Date/Time   WBC 9.9 03/18/2018 0457   RBC 3.76 (L) 03/18/2018 0457   HGB 11.4 (L) 03/18/2018 0457   HGB 12.2 12/17/2014 1039   HCT 33.7 (L) 03/18/2018 0457   HCT 37.3 12/17/2014 1039   PLT 242 03/18/2018 0457   PLT 290 12/17/2014 1039   MCV 89.6 03/18/2018 0457   MCV 93.1 12/17/2014 1039   MCH 30.3 03/18/2018 0457   MCHC 33.8 03/18/2018 0457   RDW 14.2 03/18/2018 0457   RDW 15.6 (H) 12/17/2014 1039   LYMPHSABS 1.3 12/17/2014 1039   MONOABS 0.3 12/17/2014 1039   EOSABS 0.1 12/17/2014 1039   BASOSABS 0.0 12/17/2014 1039     CMP     Component Value Date/Time   NA 140 03/18/2018 0457   NA 142 12/17/2014 1039   K 3.6 03/18/2018 0457   K 3.5 12/17/2014 1039   CL 103 03/18/2018 0457   CL 101 04/24/2013 0904   CO2 28 03/18/2018 0457   CO2 31 (H) 12/17/2014 1039   GLUCOSE 150 (H) 03/18/2018 0457   GLUCOSE 108 12/17/2014 1039   GLUCOSE 116 (H) 04/24/2013 0904   BUN 20 03/18/2018 0457   BUN 18.3 12/17/2014 1039   CREATININE 0.84 03/18/2018 0457   CREATININE 0.98 02/22/2015 0935   CREATININE 0.9 12/17/2014 1039   CALCIUM 9.1 03/18/2018 0457   CALCIUM 10.2 12/17/2014 1039   PROT 7.1 02/22/2015 0935   PROT 7.2 12/17/2014 1039   ALBUMIN 4.2 02/22/2015 0935   ALBUMIN 3.9 12/17/2014 1039   AST 18 02/22/2015 0935   AST 16 12/17/2014 1039   ALT 19 02/22/2015 0935   ALT 20 12/17/2014 1039   ALKPHOS 71 02/22/2015 0935   ALKPHOS 77 12/17/2014 1039   BILITOT 0.5 02/22/2015 0935   BILITOT 0.37 12/17/2014 1039   GFRNONAA >60 03/18/2018 0457   GFRAA >60 03/18/2018 0457     Assessment/Plan:      Dysphagia Odynophagia Chronic dysphagia with pain in upper back. Similar symptoms with her EGD in 2013. Previous dilation provided relief. No improvement with FD gard as advised at last OV for upper back pain.  Patient appears significantly younger than stated age and I feel she is medically fit to undergo procedures in the [facility] - EGD with possible dilation for further evaluation - I thoroughly discussed the procedure with the patient (at bedside) to include nature of the procedure, alternatives, benefits, and risks (including but not limited to bleeding, infection, perforation, anesthesia/cardiac pulmonary complications).  Patient verbalized understanding and gave verbal consent to proceed with procedure.   Gastroesophageal Reflux Disease (GERD) Chronic GERD with insufficient control on current omeprazole.  Possible history of hiatal hernia.. - Increase omeprazole to 40 mg once daily, 30 minutes before  breakfast. - Provide handouts on reflux management and dietary modifications. - Educated patient on lifestyle modification - EGD for further evaluation  Constipation Intermittent constipation possibly related to hypothyroidism. Inconsistent Miralax use. - Advise consistent Miralax use for 3-4 days. - Encourage placing Miralax visibly for consistent use.     History of colon polyps History of colon polyps (tubular adenomas) on colonoscopy in 2019 with a recall of 3 years.  She notes a recall last year due to to caring for her ill brother.  Patient appears significantly younger than stated age and is medically fit to undergo 1 last colonoscopy for surveillance purposes - Schedule colonoscopy - I thoroughly discussed the procedure with the patient (at bedside) to include nature of the procedure, alternatives, benefits, and risks (including but not limited to bleeding, infection, perforation, anesthesia/cardiac pulmonary complications).  Patient verbalized understanding and gave verbal consent to proceed with procedure.   Lara Mulch [facility] 02/29/2024, 11:23 AM  Cc: Rodrigo Ran, MD

## 2024-02-29 NOTE — Patient Instructions (Addendum)
 You have been scheduled for a colonoscopy. Please follow written instructions given to you at your visit today.   If you use inhalers (even only as needed), please bring them with you on the day of your procedure.  DO NOT TAKE 7 DAYS PRIOR TO TEST- Trulicity (dulaglutide) Ozempic, Wegovy (semaglutide) Mounjaro (tirzepatide) Bydureon Bcise (exanatide extended release)  DO NOT TAKE 1 DAY PRIOR TO YOUR TEST Rybelsus (semaglutide) Adlyxin (lixisenatide) Victoza (liraglutide) Byetta (exanatide) ___________________________________________________________________________  [name] will receive your bowel preparation through [facility], which ensures the lowest copay and home delivery, with outreach via text or call from an [phone] number. Please respond promptly to avoid rescheduling of your procedure. If you are interested in alternative options or have any questions regarding your prep, please contact them at [phone] ____________________________________________________________________________  Your Provider Has Sent Your Bowel Prep Regimen To [facility]   [facility] will contact you to verify your information and collect your copay, if applicable. Enjoy the comfort of your home while your prescription is mailed to you, FREE of any shipping charges.   [facility] accepts all major insurance benefits and applies discounts & coupons.  Have additional questions?   Chat: [email or web address] Call: [phone] Email: [email or web address] NCPDP: [id number]  How will [facility] contact you?  With a Welcome phone call,  a Welcome text and a checkout link in text form.  Texts you receive from [phone] Are NOT Spam.  *To set up delivery, you must complete the checkout process via link or speak to one of the patient care representatives. If [facility] is unable to reach you, your prescription may be delayed.  To avoid long hold times on the phone, you may also utilize the secure chat  feature on the [facility] website to request that they call you back for transaction completion or to expedite your concerns.  Start taking Miralax 1 capful (17 grams) 1x / day for 1 week.   If this is not effective, increase to 1 dose 2x / day for 1 week.   If this is still not effective, increase to two capfuls (34 grams) 2x / day.   Can adjust dose as needed based on response. Can take 1/2 cap daily, skip days, or increase per day.  Due to recent changes in healthcare laws, you may see the results of your imaging and laboratory studies on MyChart before your provider has had a chance to review them.  We understand that in some cases there may be results that are confusing or concerning to you. Not all laboratory results come back in the same time frame and the provider may be waiting for multiple results in order to interpret others.  Please give [place] 48 hours in order for your provider to thoroughly review all the results before contacting the office for clarification of your results.    Thank you for trusting me with your gastrointestinal care!   Boone Master, PA

## 2024-03-15 DIAGNOSIS — G4733 Obstructive sleep apnea (adult) (pediatric): Secondary | ICD-10-CM | POA: Diagnosis not present

## 2024-03-27 DIAGNOSIS — E039 Hypothyroidism, unspecified: Secondary | ICD-10-CM | POA: Diagnosis not present

## 2024-04-06 DIAGNOSIS — I10 Essential (primary) hypertension: Secondary | ICD-10-CM | POA: Diagnosis not present

## 2024-04-06 DIAGNOSIS — E669 Obesity, unspecified: Secondary | ICD-10-CM | POA: Diagnosis not present

## 2024-04-06 DIAGNOSIS — J449 Chronic obstructive pulmonary disease, unspecified: Secondary | ICD-10-CM | POA: Diagnosis not present

## 2024-04-06 DIAGNOSIS — M0609 Rheumatoid arthritis without rheumatoid factor, multiple sites: Secondary | ICD-10-CM | POA: Diagnosis not present

## 2024-04-06 DIAGNOSIS — M1990 Unspecified osteoarthritis, unspecified site: Secondary | ICD-10-CM | POA: Diagnosis not present

## 2024-04-06 DIAGNOSIS — E785 Hyperlipidemia, unspecified: Secondary | ICD-10-CM | POA: Diagnosis not present

## 2024-04-06 DIAGNOSIS — Z79899 Other long term (current) drug therapy: Secondary | ICD-10-CM | POA: Diagnosis not present

## 2024-04-14 ENCOUNTER — Encounter: Payer: Self-pay | Admitting: Gastroenterology

## 2024-04-15 DIAGNOSIS — G4733 Obstructive sleep apnea (adult) (pediatric): Secondary | ICD-10-CM | POA: Diagnosis not present

## 2024-04-24 ENCOUNTER — Encounter: Payer: Self-pay | Admitting: Gastroenterology

## 2024-04-24 ENCOUNTER — Ambulatory Visit: Admitting: Gastroenterology

## 2024-04-24 VITALS — BP 168/82 | HR 67 | Temp 97.9°F | Resp 12 | Ht 64.5 in | Wt 214.0 lb

## 2024-04-24 DIAGNOSIS — K5900 Constipation, unspecified: Secondary | ICD-10-CM

## 2024-04-24 DIAGNOSIS — D123 Benign neoplasm of transverse colon: Secondary | ICD-10-CM

## 2024-04-24 DIAGNOSIS — R1310 Dysphagia, unspecified: Secondary | ICD-10-CM

## 2024-04-24 DIAGNOSIS — K219 Gastro-esophageal reflux disease without esophagitis: Secondary | ICD-10-CM | POA: Diagnosis not present

## 2024-04-24 DIAGNOSIS — K5730 Diverticulosis of large intestine without perforation or abscess without bleeding: Secondary | ICD-10-CM

## 2024-04-24 DIAGNOSIS — Z860100 Personal history of colon polyps, unspecified: Secondary | ICD-10-CM | POA: Diagnosis not present

## 2024-04-24 DIAGNOSIS — I2510 Atherosclerotic heart disease of native coronary artery without angina pectoris: Secondary | ICD-10-CM | POA: Diagnosis not present

## 2024-04-24 DIAGNOSIS — K648 Other hemorrhoids: Secondary | ICD-10-CM | POA: Diagnosis not present

## 2024-04-24 DIAGNOSIS — R7303 Prediabetes: Secondary | ICD-10-CM | POA: Diagnosis not present

## 2024-04-24 DIAGNOSIS — K644 Residual hemorrhoidal skin tags: Secondary | ICD-10-CM | POA: Diagnosis not present

## 2024-04-24 DIAGNOSIS — I10 Essential (primary) hypertension: Secondary | ICD-10-CM | POA: Diagnosis not present

## 2024-04-24 DIAGNOSIS — E785 Hyperlipidemia, unspecified: Secondary | ICD-10-CM | POA: Diagnosis not present

## 2024-04-24 MED ORDER — SODIUM CHLORIDE 0.9 % IV SOLN
500.0000 mL | Freq: Once | INTRAVENOUS | Status: DC
Start: 2024-04-24 — End: 2024-04-24

## 2024-04-24 NOTE — Op Note (Signed)
 [facility] Patient Name: Matti Dipaola Procedure Date: 04/24/2024 2:02 PM MRN: [id number] Endoscopist: Sergio Dandy , MD, [id number] Age: 83 Referring MD:  Date of Birth: 18-Jan-1941 Gender: Female Account #: [id number] Procedure:                Colonoscopy Indications:              Change in bowel habits, Constipation Medicines:                Monitored Anesthesia Care Procedure:                Pre-Anesthesia Assessment:                           - Prior to the procedure, a History and Physical                            was performed, and patient medications and                            allergies were reviewed. The patient's tolerance of                            previous anesthesia was also reviewed. The risks                            and benefits of the procedure and the sedation                            options and risks were discussed with the patient.                            All questions were answered, and informed consent                            was obtained. Prior Anticoagulants: The patient has                            taken no anticoagulant or antiplatelet agents. ASA                            Grade Assessment: III - A patient with severe                            systemic disease. After reviewing the risks and                            benefits, the patient was deemed in satisfactory                            condition to undergo the procedure.                           After obtaining informed consent, the colonoscope  was passed under direct vision. Throughout the                            procedure, the patient's blood pressure, pulse, and                            oxygen saturations were monitored continuously. The                            [id number]-HQ190L Colonoscope [id number] was introduced                            through the anus and advanced to the the cecum,                            identified by  appendiceal orifice and ileocecal                            valve. The colonoscopy was performed without                            difficulty. The patient tolerated the procedure                            well. The quality of the bowel preparation was                            good. The ileocecal valve, appendiceal orifice, and                            rectum were photographed. Scope In: 2:23:39 PM Scope Out: 2:39:53 PM Scope Withdrawal Time: 0 hours 8 minutes 30 seconds  Total Procedure Duration: 0 hours 16 minutes 14 seconds  Findings:                 The perianal and digital rectal examinations were                            normal.                           Three sessile polyps were found in the transverse                            colon. The polyps were 4 to 7 mm in size. These                            polyps were removed with a cold snare. Resection                            and retrieval were complete.                           Scattered large-mouthed, medium-mouthed and  small-mouthed diverticula were found in the sigmoid                            colon and descending colon.                           Non-bleeding external and internal hemorrhoids were                            found during retroflexion. The hemorrhoids were                            medium-sized. Complications:            No immediate complications. Estimated Blood Loss:     Estimated blood loss was minimal. Impression:               - Three 4 to 7 mm polyps in the transverse colon,                            removed with a cold snare. Resected and retrieved.                           - Moderate diverticulosis in the sigmoid colon and                            in the descending colon.                           - Non-bleeding external and internal hemorrhoids. Recommendation:           - Patient has a contact number available for                            emergencies. The  signs and symptoms of potential                            delayed complications were discussed with the                            patient. Return to normal activities tomorrow.                            Written discharge instructions were provided to the                            patient.                           - Resume previous diet.                           - Continue present medications.                           - Await pathology results.                           -  No repeat colonoscopy due to age. Bryse Blanchette V. Theda Payer, MD 04/24/2024 2:46:06 PM This report has been signed electronically.

## 2024-04-24 NOTE — Op Note (Signed)
 [facility] Patient Name: [name] Procedure Date: 04/24/2024 2:02 PM MRN: [id number] Endoscopist: Sergio Dandy , MD, [id number] Age: 83 Referring MD:  Date of Birth: 03/24/1941 Gender: Female Account #: [id number] Procedure:                Upper GI endoscopy Indications:              Dysphagia Medicines:                Monitored Anesthesia Care Procedure:                Pre-Anesthesia Assessment:                           - Prior to the procedure, a History and Physical                            was performed, and patient medications and                            allergies were reviewed. The patient's tolerance of                            previous anesthesia was also reviewed. The risks                            and benefits of the procedure and the sedation                            options and risks were discussed with the patient.                            All questions were answered, and informed consent                            was obtained. ASA Grade Assessment: III - A patient                            with severe systemic disease. After reviewing the                            risks and benefits, the patient was deemed in                            satisfactory condition to undergo the procedure.                           After obtaining informed consent, the endoscope was                            passed under direct vision. Throughout the                            procedure, the patient's blood pressure, pulse, and  oxygen saturations were monitored continuously. The                            Olympus Scope SN [id number] was introduced through the                            mouth, and advanced to the second part of duodenum.                            The upper GI endoscopy was accomplished without                            difficulty. The patient tolerated the procedure                            well. Scope In: Scope  Out: Findings:                 The Z-line was regular and was found 38 cm from the                            incisors.                           No endoscopic abnormality was evident in the                            esophagus to explain the patient's complaint of                            dysphagia.                           A small hiatal hernia was present.                           The stomach was normal.                           The cardia and gastric fundus were normal on                            retroflexion.                           The examined duodenum was normal. Complications:            No immediate complications. Estimated Blood Loss:     Estimated blood loss was minimal. Impression:               - Z-line regular, 38 cm from the incisors.                           - No endoscopic esophageal abnormality to explain                            patient's dysphagia.                           -  Small hiatal hernia.                           - Normal stomach.                           - Normal examined duodenum.                           - No specimens collected. Recommendation:           - Resume previous diet.                           - Continue present medications.                           - Follow an antireflux regimen. Jejuan Scala V. Harshaan Whang, MD 04/24/2024 2:47:59 PM This report has been signed electronically.

## 2024-04-24 NOTE — Patient Instructions (Signed)
 Resume previous diet Continue present medications Await pathology results  You will NOT need another screening colonoscopy. However,   Please call us  at [phone] if you have a change in bowel habits, change in family history of colo-rectal cancer, rectal bleeding or other GI concern in the future.  Handouts/information given for GERD, Hiatal Hernia, polyps, diverticulosis and hemorrhoids  YOU HAD AN ENDOSCOPIC PROCEDURE TODAY AT THE [facility]:   Refer to the procedure report that was given to you for any specific questions about what was found during the examination.  If the procedure report does not answer your questions, please call your gastroenterologist to clarify.  If you requested that your care partner not be given the details of your procedure findings, then the procedure report has been included in a sealed envelope for you to review at your convenience later.  YOU SHOULD EXPECT: Some feelings of bloating in the abdomen. Passage of more gas than usual.  Walking can help get rid of the air that was put into your GI tract during the procedure and reduce the bloating. If you had a lower endoscopy (such as a colonoscopy or flexible sigmoidoscopy) you may notice spotting of blood in your stool or on the toilet paper. If you underwent a bowel prep for your procedure, you may not have a normal bowel movement for a few days.  Please Note:  You might notice some irritation and congestion in your nose or some drainage.  This is from the oxygen used during your procedure.  There is no need for concern and it should clear up in a day or so.  SYMPTOMS TO REPORT IMMEDIATELY:  Following lower endoscopy (colonoscopy):  Excessive amounts of blood in the stool  Significant tenderness or worsening of abdominal pains  Swelling of the abdomen that is new, acute  Fever of 100F or higher Following upper endoscopy (EGD)  Vomiting of blood or coffee ground material  New chest pain or  pain under the shoulder blades  Painful or persistently difficult swallowing  New shortness of breath  Black, tarry-looking stools For urgent or emergent issues, a gastroenterologist can be reached at any hour by calling [phone]. Do not use MyChart messaging for urgent concerns.   DIET:  We do recommend a small meal at first, but then you may proceed to your regular diet.  Drink plenty of fluids but you should avoid alcoholic beverages for 24 hours.  ACTIVITY:  You should plan to take it easy for the rest of today and you should NOT DRIVE or use heavy machinery until tomorrow (because of the sedation medicines used during the test).    FOLLOW UP: Our staff will call the number listed on your records the next business day following your procedure.  We will call around 7:15- 8:00 am to check on you and address any questions or concerns that you may have regarding the information given to you following your procedure. If we do not reach you, we will leave a message.     If any biopsies were taken you will be contacted by phone or by letter within the next 1-3 weeks.  Please call us  at [phone] if you have not heard about the biopsies in 3 weeks.   SIGNATURES/CONFIDENTIALITY: You and/or your care partner have signed paperwork which will be entered into your electronic medical record.  These signatures attest to the fact that that the information above on your After Visit Summary has been reviewed and  is understood.  Full responsibility of the confidentiality of this discharge information lies with you and/or your care-partner.

## 2024-04-24 NOTE — Progress Notes (Signed)
 Pt awake, alert and oriented. VSS. Airway intact. SBAR complete to RN. All questions answered.

## 2024-04-24 NOTE — Progress Notes (Signed)
 Vitals-Autumn  Pt's states no medical or surgical changes since previsit or office visit.

## 2024-04-24 NOTE — Progress Notes (Signed)
 Called to room to assist during endoscopic procedure.  Patient ID and intended procedure confirmed with present staff. Received instructions for my participation in the procedure from the performing physician.

## 2024-04-24 NOTE — Progress Notes (Signed)
 [facility] History and Physical   Primary Care Physician:  Aldo Hun, MD   Reason for Procedure:  Dysphagia, GERD, constipation, change in bowel habits  Plan:    EGD and colonoscopy with possible interventions as needed     HPI: Liliani Wojtas is a very pleasant 83 y.o. female here for EGD and colonoscopy for Dysphagia, GERD, constipation, change in bowel habits. Please refer to office visit note by Toy Freund for additional details The risks and benefits as well as alternatives of endoscopic procedure(s) have been discussed and reviewed. All questions answered. The patient agrees to proceed.    Past Medical History:  Diagnosis Date   Anemia    history of   Arthritis    osteo - s/p right total knee   Asthma    daily and prn inhalers   Boil, axilla    Breast cancer (HCC) 10/28/11   right breast   Breast cancer of upper-outer quadrant of right female breast (HCC) 11/04/2011   2.5 x 2.4 x 1.7 cm biopsy-proven invasive ductal carcinoma and ductal carcinoma in situ in the anterior aspect of the upper outer  quadrant of the right breast. There are associated linear and nodular enhancing masses extending from this mass to the  nipple/areolar complex with abnormal thickening and enhancement of the nipple and areola, compatible with extension of malignancy into  the nipple/ar   CAD (coronary artery disease)    Colon polyp    GERD (gastroesophageal reflux disease)    Grave's disease    no current meds.   H pylori ulcer    Heart murmur    History of blood transfusion 2006   History of chemotherapy    neoadjuvant: 5 cycles of Taxol/carboplatinum:  last dose 02/24/12   History of hiatal hernia    Hyperlipidemia    Hypertension    under control with med.   Hypothyroid    Liver cyst    History of    Pre-diabetes    RA (rheumatoid arthritis) (HCC)    S/P chemotherapy, time since 4-12 weeks finished 02/20/2011   S/P radiation therapy    Right Breast High Axilla and  Supraclavicular gegion: 4500 cGy/25 Fractions with boost for a total of 6300 cGy   Sleep apnea sleep study 09/18/2005   uses CPAP occ. - to bring machine DOS   Spondylosis    cervical and lumbar   Use of letrozole  (Femara ) start 07/21/12    Past Surgical History:  Procedure Laterality Date   ABDOMINAL HYSTERECTOMY     BILATERAL OOPHORECTOMY  2009   Boil removal under arm Right    BREAST BIOPSY Bilateral 1998   BREAST BIOPSY Right 08/11/2018   FIBROADENOMATOID NODULE WITH CALCIFICATIONS    BREAST LUMPECTOMY Right 03/2012   CARDIAC CATHETERIZATION  04/10/2010, 05/07/2004   LAD lesion, blood flow OK, per pt.   CARDIOVASCULAR STRESS TEST  02/15/2018   CARPAL TUNNEL RELEASE     left   CARPAL TUNNEL RELEASE Right    CATARACT EXTRACTION, BILATERAL     COLONOSCOPY  12/20/2012   Procedure: COLONOSCOPY;  Surgeon: Pietro Bridegroom, MD;  Location: [facility];  Service: Endoscopy;  Laterality: N/A;   ESOPHAGOGASTRODUODENOSCOPY  12/20/2012   Procedure: ESOPHAGOGASTRODUODENOSCOPY (EGD);  Surgeon: Pietro Bridegroom, MD;  Location: [facility];  Service: Endoscopy;  Laterality: N/A;   LESION REMOVAL  11/27/2011   Procedure: LESION REMOVAL;  Surgeon: Levert Ready, MD;  Location:  SURGERY CENTER;  Service: General;  Laterality: Right;  Skin tag removed from under right eye. No specimen   LUMBAR LAMINECTOMY     back surg. x 2 - 1980's and 1999   NM MYOVIEW  LTD  08/02/2012   no ischemia   PARTIAL HYSTERECTOMY     BSO   PORTACATH PLACEMENT  11/27/2011   Procedure: INSERTION PORT-A-CATH;  Surgeon: Levert Ready, MD;  Location: [facility];  Service: General;  Laterality: Right;   TOTAL HIP ARTHROPLASTY Right 03/17/2018   Procedure: RIGHT TOTAL HIP ARTHROPLASTY ANTERIOR APPROACH;  Surgeon: Adonica Hoose, MD;  Location: [facility];  Service: Orthopedics;  Laterality: Right;  Needs RNFA   TOTAL KNEE ARTHROPLASTY  07/28/2006   right   US  ECHOCARDIOGRAPHY  08/14/2008   technically  difficult-mild LVH, EF =>55%,mild annular ca+,AOV mildly sclerotic    Prior to Admission medications   Medication Sig Start Date End Date Taking? Authorizing Provider  albuterol (VENTOLIN HFA) 108 (90 Base) MCG/ACT inhaler Inhale 2 puffs into the lungs every 4 (four) hours as needed. 02/14/24  Yes [provider]  aspirin  81 MG EC tablet Take 81 mg by mouth daily. 11/11/09  Yes [provider]  Cholecalciferol  (VITAMIN D -3) 5000 units TABS Take 5,000 Units by mouth daily.   Yes [provider]  Cyanocobalamin (B-12) 1000 MCG TABS Take 1,000 mcg by mouth daily. 08/03/19  Yes [provider]  ezetimibe  (ZETIA ) 10 MG tablet Take 10 mg by mouth every evening.  04/27/17  Yes [provider]  Fluticasone-Salmeterol (ADVAIR) 100-50 MCG/DOSE AEPB Inhale 1 puff into the lungs daily.    Yes [provider]  gabapentin  (NEURONTIN ) 100 MG capsule Take 3 capsules (300 mg total) by mouth at bedtime. Patient taking differently: Take 100 mg by mouth at bedtime. 08/04/19  Yes Gudena, Vinay, MD  ipratropium (ATROVENT HFA) 17 MCG/ACT inhaler Inhale 2 puffs into the lungs every 6 (six) hours.   Yes [provider]  levothyroxine  (SYNTHROID , LEVOTHROID) 75 MCG tablet Take 75 mcg by mouth daily before breakfast. Taking 150 mcg on Sundays and Saturdays and 75 mcg Monday-Fridays. 04/29/15  Yes [provider]  Methotrexate (XATMEP PO) Take 0.6 mg by mouth once a week.   Yes [provider]  omeprazole  (PRILOSEC) 40 MG capsule Take 1 capsule (40 mg total) by mouth daily. 02/29/24  Yes McMichael, Bayley M, PA-C  TIADYLT  ER 360 MG 24 hr capsule Take 360 mg by mouth at bedtime. 01/09/20  Yes [provider]  Tiotropium Bromide  Monohydrate (SPIRIVA  RESPIMAT IN) Spiriva  with HandiHaler   Yes [provider]  triamterene -hydrochlorothiazide  (MAXZIDE -25) 37.5-25 MG tablet TAKE ONE TABLET BY MOUTH EVERY MORNING 02/11/23  Yes Croitoru, Mihai,  MD  diclofenac sodium (VOLTAREN) 1 % GEL Apply 2-4 g topically 4 (four) times daily as needed. For joint pain. 01/04/18   [provider]  Evolocumab (REPATHA SURECLICK) 140 MG/ML SOAJ as directed Subcutaneous every two weeks as directed 140 mg sub q with each dose for 90 days    [provider]  omeprazole  (PRILOSEC) 20 MG capsule Take 20 mg by mouth daily. 12/27/19   [provider]    Current Outpatient Medications  Medication Sig Dispense Refill   albuterol (VENTOLIN HFA) 108 (90 Base) MCG/ACT inhaler Inhale 2 puffs into the lungs every 4 (four) hours as needed.     aspirin  81 MG EC tablet Take 81 mg by mouth daily.     Cholecalciferol  (VITAMIN D -3) 5000 units TABS Take 5,000 Units by mouth daily.  Cyanocobalamin (B-12) 1000 MCG TABS Take 1,000 mcg by mouth daily.     ezetimibe  (ZETIA ) 10 MG tablet Take 10 mg by mouth every evening.      Fluticasone-Salmeterol (ADVAIR) 100-50 MCG/DOSE AEPB Inhale 1 puff into the lungs daily.      gabapentin  (NEURONTIN ) 100 MG capsule Take 3 capsules (300 mg total) by mouth at bedtime. (Patient taking differently: Take 100 mg by mouth at bedtime.)     ipratropium (ATROVENT HFA) 17 MCG/ACT inhaler Inhale 2 puffs into the lungs every 6 (six) hours.     levothyroxine  (SYNTHROID , LEVOTHROID) 75 MCG tablet Take 75 mcg by mouth daily before breakfast. Taking 150 mcg on Sundays and Saturdays and 75 mcg Monday-Fridays.  2   Methotrexate (XATMEP PO) Take 0.6 mg by mouth once a week.     omeprazole  (PRILOSEC) 40 MG capsule Take 1 capsule (40 mg total) by mouth daily. 30 capsule 3   TIADYLT  ER 360 MG 24 hr capsule Take 360 mg by mouth at bedtime.     Tiotropium Bromide  Monohydrate (SPIRIVA  RESPIMAT IN) Spiriva  with HandiHaler     triamterene -hydrochlorothiazide  (MAXZIDE -25) 37.5-25 MG tablet TAKE ONE TABLET BY MOUTH EVERY MORNING 90 tablet 3   diclofenac sodium (VOLTAREN) 1 % GEL Apply 2-4 g topically 4 (four) times daily as needed. For joint  pain.  3   Evolocumab (REPATHA SURECLICK) 140 MG/ML SOAJ as directed Subcutaneous every two weeks as directed 140 mg sub q with each dose for 90 days     omeprazole  (PRILOSEC) 20 MG capsule Take 20 mg by mouth daily.     Current Facility-Administered Medications  Medication Dose Route Frequency Provider Last Rate Last Admin   0.9 %  sodium chloride  infusion  500 mL Intravenous Once Lumir Demetriou V, MD        Allergies as of 04/24/2024 - Review Complete 04/24/2024  Allergen Reaction Noted   Crestor  [rosuvastatin ] Other (See Comments) 05/31/2013   Lactose intolerance (gi) Other (See Comments) 03/29/2012   Lipitor [atorvastatin calcium ] Other (See Comments) 03/29/2012   Zocor [simvastatin - high dose] Other (See Comments) 11/04/2011    Family History  Problem Relation Age of Onset   Pancreatic cancer Paternal Uncle    Cancer Maternal Grandfather        unknown type   Cancer Paternal Grandfather        unknown type   Anesthesia problems Cousin        pt. states she is not aware of any family anes. prob., does not know where this comment came from   Breast cancer Cousin    Ovarian cancer Cousin 40 - 50   Ovarian cancer Cousin    Ovarian cancer Niece 74   Colon cancer Neg Hx    Esophageal cancer Neg Hx    Rectal cancer Neg Hx    Stomach cancer Neg Hx     Social History   Socioeconomic History   Marital status: Single    Spouse name: Not on file   Number of children: Not on file   Years of education: Not on file   Highest education level: Not on file  Occupational History   Occupation: retired    Associate Professor: RETIRED  Tobacco Use   Smoking status: Never   Smokeless tobacco: Never  Vaping Use   Vaping status: Never Used  Substance and Sexual Activity   Alcohol  use: No    Alcohol /week: 0.0 standard drinks of alcohol    Drug use: No   Sexual  activity: Not Currently    Birth control/protection: Surgical  Other Topics Concern   Not on file  Social History Narrative    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  Physical Exam: Vital signs in last 24 hours: BP (!) 157/89 (BP Location: Right Arm, Patient Position: Sitting, Cuff Size: Normal)   Pulse 82   Temp 97.9 F (36.6 C) (Temporal)   Ht 5' 4.5" (1.638 m)   Wt 214 lb (97.1 kg)   SpO2 97%   BMI 36.17 kg/m  General:   Alert, NAD Lungs:  Clear .   Heart:  Regular rate and rhythm Abdomen:  Soft, nontender and nondistended. Neuro/Psych:  Alert and cooperative. Normal mood and affect. A and O x 3  Reviewed labs, radiology imaging, old records and pertinent past GI work up  Patient is appropriate for planned procedure(s) and anesthesia in an ambulatory setting   K. Veena Callen Vancuren , MD [phone]

## 2024-04-25 ENCOUNTER — Telehealth: Payer: Self-pay

## 2024-04-25 NOTE — Telephone Encounter (Signed)
  Follow up Call-     04/24/2024    1:21 PM 04/24/2024    1:16 PM  Call back number  Post procedure Call Back phone  # [phone]   Permission to leave phone message  Yes     Patient questions:  Do you have a fever, pain , or abdominal swelling? No. Pain Score  0 *  Have you tolerated food without any problems? Yes.    Have you been able to return to your normal activities? Yes.    Do you have any questions about your discharge instructions: Diet   No. Medications  No. Follow up visit  No.  Do you have questions or concerns about your Care? No.  Actions: * If pain score is 4 or above: No action needed, pain <4.

## 2024-04-27 LAB — SURGICAL PATHOLOGY

## 2024-04-28 ENCOUNTER — Encounter: Admitting: Gastroenterology

## 2024-05-15 DIAGNOSIS — G4733 Obstructive sleep apnea (adult) (pediatric): Secondary | ICD-10-CM | POA: Diagnosis not present

## 2024-05-26 DIAGNOSIS — G4733 Obstructive sleep apnea (adult) (pediatric): Secondary | ICD-10-CM | POA: Diagnosis not present

## 2024-05-28 ENCOUNTER — Other Ambulatory Visit: Payer: Self-pay | Admitting: Gastroenterology

## 2024-05-29 ENCOUNTER — Ambulatory Visit: Payer: Self-pay | Admitting: Gastroenterology

## 2024-06-15 DIAGNOSIS — G4733 Obstructive sleep apnea (adult) (pediatric): Secondary | ICD-10-CM | POA: Diagnosis not present

## 2024-07-11 ENCOUNTER — Encounter: Payer: Self-pay | Admitting: Adult Health

## 2024-07-11 ENCOUNTER — Ambulatory Visit: Admitting: Adult Health

## 2024-07-11 VITALS — BP 122/62 | HR 54 | Ht 64.0 in | Wt 219.6 lb

## 2024-07-11 DIAGNOSIS — Z6835 Body mass index (BMI) 35.0-35.9, adult: Secondary | ICD-10-CM

## 2024-07-11 DIAGNOSIS — J4530 Mild persistent asthma, uncomplicated: Secondary | ICD-10-CM | POA: Diagnosis not present

## 2024-07-11 DIAGNOSIS — G4733 Obstructive sleep apnea (adult) (pediatric): Secondary | ICD-10-CM

## 2024-07-11 NOTE — Patient Instructions (Addendum)
 Restart CPAP At bedtime, wear all night long for at least 6hr or more .  Healthy sleep regimen  Work on healthy weight  Saline nasal spray As needed   Saline nasal gel At bedtime  As needed    Continue on Advair 1 puff Twice daily , rinse after use  Continue on Spiriva  daily.  Albuterol inhaler As needed   Claritin 10mg  At bedtime  As needed   Activity as tolerated.  Follow up in 6 months with Dr. Neda and As needed  (30 min slot)

## 2024-07-11 NOTE — Progress Notes (Signed)
 @Patient  ID: [name], female    DOB: [birth year], 83 y.o.   MRN: [id number]  Chief Complaint  Patient presents with   Follow-up    Referring provider: Shayne Anes, MD  HPI: 83 yo female never smoker seen for consult July 2024 to establish for OSA and Asthma  Medical history significant for rheumatoid arthritis, history of breast cancer status postlumpectomy, chemo and radiation   TEST/EVENTS :  Home sleep study August 03, 2023 showed moderate sleep apnea with AHI 26.8/hour and SpO2 low at 71%.   PFTs were done November 25, 2023 that showed no significant airflow obstruction or restriction. FEV1 at 90%, ratio 83, FVC 81%, no significant bronchodilator response, DLCO slightly decreased at 78%.    07/11/2024 Follow up : OSA and Asthma  Discussed the use of AI scribe software for clinical note transcription with the patient, who gave verbal consent to proceed.  History of Present Illness   [name] is an 83 year old female with sleep apnea and asthma who presents for a checkup.  She has been inconsistently using her CPAP machine due to personal stressors, including the recent loss of her siblings. A previous sleep study in August 2024 showed moderate OSA with AHI 20.8/hour SpO2 71%.  She says she had a hard time getting into a routine.  We discussed the importance on CPAP compliance.  Discussed potential complications of untreated sleep apnea.  Regarding her asthma, she continues to use her inhalers, Advair and Spiriva , twice daily. Her breathing is affected by hot, humid weather and allergies, which cause coughing and congestion. She is allergic to aerosol sprays and experiences congestion after using her inhalers. She uses Claritin for allergy relief as needed.  Says overall breathing is doing well.  Does have some cough first thing in the morning.  Seems to improve as the day goes on.  She is on methotrexate injections once a week for rheumatoid arthritis, which she  feels controls her symptoms fairly well. She mentions a repaired knee and suspects a rheumatoid nodule may be causing pain under a plate in her knee.  She is up to date on her vaccinations, having received the RSV vaccine and previously the pneumonia vaccine.  She is up-to-date for flu and COVID vaccines.    Allergies  Allergen Reactions   Crestor  [Rosuvastatin ] Other (See Comments)    crampin of legs and feet--pt is currently taking.   Lactose Intolerance (Gi) Other (See Comments)    ABD. CRAMPS   Lipitor [Atorvastatin Calcium ] Other (See Comments)    LEG CRAMPS   Zocor [Simvastatin - High Dose] Other (See Comments)    Leg cramps    Immunization History  Administered Date(s) Administered   Fluad  Trivalent(High Dose 65+) 09/20/2023   Influenza, High Dose Seasonal PF 09/30/2016, 09/27/2019   Influenza-Unspecified 09/20/2017, 10/07/2022   PFIZER Comirnaty Alejos Top)Covid-19 Tri-Sucrose Vaccine 05/07/2021   PFIZER(Purple Top)SARS-COV-2 Vaccination 09/02/2020   Pfizer Covid-19 Vaccine Bivalent Booster 27yrs & up 12/24/2021   Pfizer(Comirnaty )Fall Seasonal Vaccine 12 years and older 10/06/2022, 09/20/2023   Respiratory Syncytial Virus Vaccine ,Recomb Aduvanted(Arexvy ) 11/23/2022   Zoster Recombinant(Shingrix ) 09/04/2021, 11/06/2021    Past Medical History:  Diagnosis Date   Anemia    history of   Arthritis    osteo - s/p right total knee   Asthma    daily and prn inhalers   Boil, axilla    Breast cancer (HCC) 10/28/11   right breast   Breast cancer of upper-outer quadrant of right  female breast (HCC) 11/04/2011   2.5 x 2.4 x 1.7 cm biopsy-proven invasive ductal carcinoma and ductal carcinoma in situ in the anterior aspect of the upper outer  quadrant of the right breast. There are associated linear and nodular enhancing masses extending from this mass to the  nipple/areolar complex with abnormal thickening and enhancement of the nipple and areola, compatible with extension of  malignancy into  the nipple/ar   CAD (coronary artery disease)    Colon polyp    GERD (gastroesophageal reflux disease)    Grave's disease    no current meds.   H pylori ulcer    Heart murmur    History of blood transfusion 2006   History of chemotherapy    neoadjuvant: 5 cycles of Taxol/carboplatinum:  last dose 02/24/12   History of hiatal hernia    Hyperlipidemia    Hypertension    under control with med.   Hypothyroid    Liver cyst    History of    Pre-diabetes    RA (rheumatoid arthritis) (HCC)    S/P chemotherapy, time since 4-12 weeks finished 02/20/2011   S/P radiation therapy    Right Breast High Axilla and Supraclavicular gegion: 4500 cGy/25 Fractions with boost for a total of 6300 cGy   Sleep apnea sleep study 09/18/2005   uses CPAP occ. - to bring machine DOS   Spondylosis    cervical and lumbar   Use of letrozole  (Femara ) start 07/21/12    Tobacco History: Social History   Tobacco Use  Smoking Status Never  Smokeless Tobacco Never   Counseling given: Not Answered   Outpatient Medications Prior to Visit  Medication Sig Dispense Refill   albuterol (VENTOLIN HFA) 108 (90 Base) MCG/ACT inhaler Inhale 2 puffs into the lungs every 4 (four) hours as needed.     aspirin  81 MG EC tablet Take 81 mg by mouth daily.     Cholecalciferol  (VITAMIN D -3) 5000 units TABS Take 5,000 Units by mouth daily.     Cyanocobalamin (B-12) 1000 MCG TABS Take 1,000 mcg by mouth daily.     diclofenac sodium (VOLTAREN) 1 % GEL Apply 2-4 g topically 4 (four) times daily as needed. For joint pain.  3   Evolocumab (REPATHA SURECLICK) 140 MG/ML SOAJ as directed Subcutaneous every two weeks as directed 140 mg sub q with each dose for 90 days     ezetimibe  (ZETIA ) 10 MG tablet Take 10 mg by mouth every evening.      Fluticasone-Salmeterol (ADVAIR) 100-50 MCG/DOSE AEPB Inhale 1 puff into the lungs daily.      gabapentin  (NEURONTIN ) 100 MG capsule Take 3 capsules (300 mg total) by mouth at bedtime.  (Patient taking differently: Take 100 mg by mouth at bedtime.)     ipratropium (ATROVENT HFA) 17 MCG/ACT inhaler Inhale 2 puffs into the lungs every 6 (six) hours.     levothyroxine  (SYNTHROID , LEVOTHROID) 75 MCG tablet Take 75 mcg by mouth daily before breakfast. Taking 150 mcg on Sundays and Saturdays and 75 mcg Monday-Fridays.  2   Methotrexate (XATMEP PO) Take 0.6 mg by mouth once a week.     omeprazole  (PRILOSEC) 40 MG capsule TAKE 1 CAPSULE (40 MG TOTAL) BY MOUTH DAILY. 90 capsule 1   TIADYLT  ER 360 MG 24 hr capsule Take 360 mg by mouth at bedtime.     Tiotropium Bromide  Monohydrate (SPIRIVA  RESPIMAT IN) Spiriva  with HandiHaler     triamterene -hydrochlorothiazide  (MAXZIDE -25) 37.5-25 MG tablet TAKE ONE TABLET BY MOUTH  EVERY MORNING 90 tablet 3   No facility-administered medications prior to visit.     Review of Systems:   Constitutional:   No  weight loss, night sweats,  Fevers, chills, +fatigue, or  lassitude.  HEENT:   No headaches,  Difficulty swallowing,  Tooth/dental problems, or  Sore throat,                No sneezing, itching, ear ache, nasal congestion, post nasal drip,   CV:  No chest pain,  Orthopnea, PND, swelling in lower extremities, anasarca, dizziness, palpitations, syncope.   GI  No heartburn, indigestion, abdominal pain, nausea, vomiting, diarrhea, change in bowel habits, loss of appetite, bloody stools.   Resp: No shortness of breath with exertion or at rest.  No excess mucus, no productive cough,  No non-productive cough,  No coughing up of blood.  No change in color of mucus.  No wheezing.  No chest wall deformity  Skin: no rash or lesions.  GU: no dysuria, change in color of urine, no urgency or frequency.  No flank pain, no hematuria   MS: Chronic joint pain.    Physical Exam  BP (!) 142/62 (BP Location: Left Arm, Patient Position: Sitting, Cuff Size: Large)   Pulse (!) 54   Ht 5' 4 (1.626 m)   Wt 219 lb 9.6 oz (99.6 kg)   SpO2 99%   BMI 37.69  kg/m   GEN: A/Ox3; pleasant , NAD, well nourished    HEENT:  [place]/AT, NOSE-clear, THROAT-clear, no lesions, no postnasal drip or exudate noted.   NECK:  Supple w/ fair ROM; no JVD; normal carotid impulses w/o bruits; no thyromegaly or nodules palpated; no lymphadenopathy.    RESP  Clear  P & A; w/o, wheezes/ rales/ or rhonchi. no accessory muscle use, no dullness to percussion  CARD:  RRR, no m/r/g, no peripheral edema, pulses intact, no cyanosis or clubbing.  GI:   Soft & nt; nml bowel sounds; no organomegaly or masses detected.   Musco: Warm bil, no deformities or joint swelling noted.   Neuro: alert, no focal deficits noted.    Skin: Warm, no lesions or rashes    Lab Results:    BNP   ProBNP No results found for: PROBNP  Imaging: No results found.  Administration History     None          Latest Ref Rng & Units 11/25/2023    2:38 PM  PFT Results  FVC-Pre L 2.40   FVC-Predicted Pre % 87   FVC-Post L 2.21   FVC-Predicted Post % 81   Pre FEV1/FVC % % 81   Post FEV1/FCV % % 83   FEV1-Pre L 1.95   FEV1-Predicted Pre % 95   FEV1-Post L 1.84   DLCO uncorrected ml/min/mmHg 15.20   DLCO UNC% % 78   DLCO corrected ml/min/mmHg 15.20   DLCO COR %Predicted % 78   DLVA Predicted % 98   TLC L 4.12   TLC % Predicted % 79   RV % Predicted % 76     No results found for: NITRICOXIDE      Assessment & Plan:   No problem-specific Assessment & Plan notes found for this encounter.  Assessment and Plan    Obstructive Sleep Apnea -moderate obstructive sleep apnea we discussed importance of CPAP compliance.  PGH given on potential complications of untreated sleep apnea. Non-compliance with CPAP therapy is due to personal stressors.  Support provided.  Encourage regular CPAP  use to reduce health risks and improve energy levels.  Asthma  -mild persistent-appears to be doing well.  Continue on current regimen Her asthma is exacerbated by hot, humid weather and  allergies, with congestion occurring after using Advair and Spiriva  due to the bronchodilator effect mobilizing mucus. Allergy symptoms are managed with Claritin as needed. Continue Advair and Spiriva  as prescribed. Use Claritin as needed for allergy symptoms. Consider saline nasal spray and gel for nasal congestion.  Allergic rhinitis continue on Claritin daily as needed  Morbid obesity.  Continue with healthy weight loss  Rheumatoid Arthritis  -appears to be stable.  Continue follow-up with rheumatology      Plan  Patient Instructions  Restart CPAP At bedtime, wear all night long for at least 6hr or more .  Healthy sleep regimen  Work on healthy weight  Saline nasal spray As needed   Saline nasal gel At bedtime  As needed    Continue on Advair 1 puff Twice daily , rinse after use  Continue on Spiriva  daily.  Albuterol inhaler As needed   Claritin 10mg  At bedtime  As needed   Activity as tolerated.  Follow up in 6 months with Dr. Neda and As needed  (30 min slot)    Madelin Stank, NP 07/11/2024

## 2024-07-15 DIAGNOSIS — G4733 Obstructive sleep apnea (adult) (pediatric): Secondary | ICD-10-CM | POA: Diagnosis not present

## 2024-08-04 DIAGNOSIS — N760 Acute vaginitis: Secondary | ICD-10-CM | POA: Diagnosis not present

## 2024-08-04 DIAGNOSIS — Z6838 Body mass index (BMI) 38.0-38.9, adult: Secondary | ICD-10-CM | POA: Diagnosis not present

## 2024-08-04 DIAGNOSIS — Z1231 Encounter for screening mammogram for malignant neoplasm of breast: Secondary | ICD-10-CM | POA: Diagnosis not present

## 2024-08-04 DIAGNOSIS — Z01419 Encounter for gynecological examination (general) (routine) without abnormal findings: Secondary | ICD-10-CM | POA: Diagnosis not present

## 2024-08-14 DIAGNOSIS — U071 COVID-19: Secondary | ICD-10-CM | POA: Diagnosis not present

## 2024-08-14 DIAGNOSIS — N1831 Chronic kidney disease, stage 3a: Secondary | ICD-10-CM | POA: Diagnosis not present

## 2024-08-14 DIAGNOSIS — Z1152 Encounter for screening for COVID-19: Secondary | ICD-10-CM | POA: Diagnosis not present

## 2024-08-14 DIAGNOSIS — M069 Rheumatoid arthritis, unspecified: Secondary | ICD-10-CM | POA: Diagnosis not present

## 2024-08-14 DIAGNOSIS — J4551 Severe persistent asthma with (acute) exacerbation: Secondary | ICD-10-CM | POA: Diagnosis not present

## 2024-08-14 DIAGNOSIS — I129 Hypertensive chronic kidney disease with stage 1 through stage 4 chronic kidney disease, or unspecified chronic kidney disease: Secondary | ICD-10-CM | POA: Diagnosis not present

## 2024-08-14 DIAGNOSIS — G43909 Migraine, unspecified, not intractable, without status migrainosus: Secondary | ICD-10-CM | POA: Diagnosis not present

## 2024-08-14 DIAGNOSIS — R0981 Nasal congestion: Secondary | ICD-10-CM | POA: Diagnosis not present

## 2024-08-14 DIAGNOSIS — J309 Allergic rhinitis, unspecified: Secondary | ICD-10-CM | POA: Diagnosis not present

## 2024-08-14 DIAGNOSIS — R051 Acute cough: Secondary | ICD-10-CM | POA: Diagnosis not present

## 2024-08-15 DIAGNOSIS — G4733 Obstructive sleep apnea (adult) (pediatric): Secondary | ICD-10-CM | POA: Diagnosis not present

## 2024-08-18 DIAGNOSIS — G4733 Obstructive sleep apnea (adult) (pediatric): Secondary | ICD-10-CM | POA: Diagnosis not present

## 2024-09-06 DIAGNOSIS — M0609 Rheumatoid arthritis without rheumatoid factor, multiple sites: Secondary | ICD-10-CM | POA: Diagnosis not present

## 2024-09-06 DIAGNOSIS — Z79899 Other long term (current) drug therapy: Secondary | ICD-10-CM | POA: Diagnosis not present

## 2024-09-06 DIAGNOSIS — Z23 Encounter for immunization: Secondary | ICD-10-CM | POA: Diagnosis not present

## 2024-09-06 DIAGNOSIS — M1990 Unspecified osteoarthritis, unspecified site: Secondary | ICD-10-CM | POA: Diagnosis not present

## 2024-09-11 DIAGNOSIS — R051 Acute cough: Secondary | ICD-10-CM | POA: Diagnosis not present

## 2024-09-11 DIAGNOSIS — N1831 Chronic kidney disease, stage 3a: Secondary | ICD-10-CM | POA: Diagnosis not present

## 2024-09-11 DIAGNOSIS — C50919 Malignant neoplasm of unspecified site of unspecified female breast: Secondary | ICD-10-CM | POA: Diagnosis not present

## 2024-09-11 DIAGNOSIS — E1169 Type 2 diabetes mellitus with other specified complication: Secondary | ICD-10-CM | POA: Diagnosis not present

## 2024-09-11 DIAGNOSIS — Z1152 Encounter for screening for COVID-19: Secondary | ICD-10-CM | POA: Diagnosis not present

## 2024-09-11 DIAGNOSIS — I129 Hypertensive chronic kidney disease with stage 1 through stage 4 chronic kidney disease, or unspecified chronic kidney disease: Secondary | ICD-10-CM | POA: Diagnosis not present

## 2024-09-11 DIAGNOSIS — K219 Gastro-esophageal reflux disease without esophagitis: Secondary | ICD-10-CM | POA: Diagnosis not present

## 2024-09-11 DIAGNOSIS — J4551 Severe persistent asthma with (acute) exacerbation: Secondary | ICD-10-CM | POA: Diagnosis not present

## 2024-09-11 DIAGNOSIS — J45909 Unspecified asthma, uncomplicated: Secondary | ICD-10-CM | POA: Diagnosis not present

## 2024-09-11 DIAGNOSIS — E039 Hypothyroidism, unspecified: Secondary | ICD-10-CM | POA: Diagnosis not present

## 2024-09-11 DIAGNOSIS — E785 Hyperlipidemia, unspecified: Secondary | ICD-10-CM | POA: Diagnosis not present

## 2024-09-11 DIAGNOSIS — M069 Rheumatoid arthritis, unspecified: Secondary | ICD-10-CM | POA: Diagnosis not present

## 2024-09-11 DIAGNOSIS — K279 Peptic ulcer, site unspecified, unspecified as acute or chronic, without hemorrhage or perforation: Secondary | ICD-10-CM | POA: Diagnosis not present

## 2024-09-19 ENCOUNTER — Inpatient Hospital Stay: Payer: PPO | Attending: Adult Health | Admitting: Adult Health

## 2024-09-19 ENCOUNTER — Encounter: Payer: Self-pay | Admitting: Adult Health

## 2024-09-19 VITALS — BP 156/74 | HR 86 | Temp 98.6°F | Resp 18 | Ht 64.0 in | Wt 215.0 lb

## 2024-09-19 DIAGNOSIS — Z853 Personal history of malignant neoplasm of breast: Secondary | ICD-10-CM | POA: Insufficient documentation

## 2024-09-19 DIAGNOSIS — Z9221 Personal history of antineoplastic chemotherapy: Secondary | ICD-10-CM | POA: Diagnosis not present

## 2024-09-19 DIAGNOSIS — Z1231 Encounter for screening mammogram for malignant neoplasm of breast: Secondary | ICD-10-CM

## 2024-09-19 DIAGNOSIS — Z923 Personal history of irradiation: Secondary | ICD-10-CM | POA: Diagnosis not present

## 2024-09-19 NOTE — Progress Notes (Signed)
 [facility] Cancer Follow up:    Jasmine Anes, MD [address] [place] [address]   DIAGNOSIS: history of right breast cancer   SUMMARY OF ONCOLOGIC HISTORY: Oncology History  History of right breast cancer  12/02/2011 -  Neo-Adjuvant Chemotherapy   Taxotere  and Cytoxan  4    04/20/2012 Initial Diagnosis   Right breast invasive ductal carcinoma with DCIS 2.5 cm with the MRI showing a maximum diameter of 7.5 cm ER/PR positive HER-2 negative Ki-67 15%    Surgery   Lumpectomy: IDC with lymphovascular invasion 1.9 cm, 2 sentinel lymph nodes negative    Radiation Therapy   Adjuvant radiation   07/21/2012 - 07/2019 Anti-estrogen oral therapy   Letrozole  2.5 mg daily    11/30/2022 Genetic Testing   Updated her genetic testing in 2023: No pathogenic mutations.  Genetic testing identified a variant of uncertain significance (VUS) in the MLH1 gene called c.1245T>G.    The Common Hereditary Cancers Panel offered by [facility] includes sequencing and/or deletion duplication testing of the following 48 genes: APC, ATM, AXIN2, BAP1, BARD1, BMPR1A, BRCA1, BRCA2, BRIP1, CDH1, CDK4, CDKN2A (p14ARF and p16INK4a only), CHEK2, CTNNA1, DICER1, EPCAM (Deletion/duplication testing only), FH, GREM1 (promoter region duplication testing only), HOXB13, KIT, MBD4, MEN1, MLH1, MSH2, MSH3, MSH6, MUTYH, NF1, NHTL1, PALB2, PDGFRA, PMS2, POLD1, POLE, PTEN, RAD51C, RAD51D, SDHA (sequencing analysis only except exon 14), SDHB, SDHC, SDHD, SMAD4, SMARCA4. STK11, TP53, TSC1, TSC2, and VHL.     CURRENT THERAPY: observation  INTERVAL HISTORY:  Discussed the use of AI scribe software for clinical note transcription with the patient, who gave verbal consent to proceed.  History of Present Illness [name] is an 83 year old female with a history of breast cancer who presents for follow-up and evaluation.  She was diagnosed with breast cancer in the right breast in 2012. Her treatment  included neoadjuvant chemotherapy with Taxotere  and Cytoxan , followed by a lumpectomy, adjuvant radiation, and seven years of antiestrogen therapy with letrozole , completed in 2020. She is currently under observation alone. Her most recent mammogram in June or July 2024 showed no evidence of malignancy and breast density category B.  She experiences back pain and discomfort after eating certain foods, which she associates with diverticulosis. She has not received antibiotics for this condition. She finds relief by drinking water  and using Miralax  daily.  She has rheumatoid arthritis, which sometimes causes knee pain. She remains active by walking in the mall.  She experienced a residual cough from a past COVID-19 infection. No recent shortness of breath, chest pain, or palpitations.    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 (HCC) 09/04/2021   Atherosclerotic heart disease of native coronary artery with other forms of angina pectoris 09/04/2021   Osteoarthritis of right hip 03/17/2018   Primary osteoarthritis of right hip 03/17/2018   Severe obesity (BMI 35.0-35.9 with comorbidity) (HCC) 04/30/2017   Essential hypertension 03/20/2016   CAD (coronary artery disease), native coronary artery 06/06/2013   Hyperlipidemia, poorly tolerant of statins 06/06/2013   Esophageal reflux 12/20/2012   Other dysphagia 12/20/2012   Benign neoplasm of colon 12/20/2012   GERD (gastroesophageal reflux disease) 12/06/2012   Diverticulosis 12/06/2012   Sleep apnea 12/06/2012   Asthma 12/06/2012  Thyroid  disease 12/06/2012   History of right breast cancer 04/20/2012    is allergic to crestor  [rosuvastatin ], lactose intolerance (gi), lipitor [atorvastatin calcium ],  and zocor [simvastatin - high dose].  MEDICAL HISTORY: Past Medical History:  Diagnosis Date   Anemia    history of   Arthritis    osteo - s/p right total knee   Asthma    daily and prn inhalers   Boil, axilla    Breast cancer (HCC) 10/28/11   right breast   Breast cancer of upper-outer quadrant of right female breast (HCC) 11/04/2011   2.5 x 2.4 x 1.7 cm biopsy-proven invasive ductal carcinoma and ductal carcinoma in situ in the anterior aspect of the upper outer  quadrant of the right breast. There are associated linear and nodular enhancing masses extending from this mass to the  nipple/areolar complex with abnormal thickening and enhancement of the nipple and areola, compatible with extension of malignancy into  the nipple/ar   CAD (coronary artery disease)    Colon polyp    GERD (gastroesophageal reflux disease)    Grave's disease    no current meds.   H pylori ulcer    Heart murmur    History of blood transfusion 2006   History of chemotherapy    neoadjuvant: 5 cycles of Taxol/carboplatinum:  last dose 02/24/12   History of hiatal hernia    Hyperlipidemia    Hypertension    under control with med.   Hypothyroid    Liver cyst    History of    Pre-diabetes    RA (rheumatoid arthritis) (HCC)    S/P chemotherapy, time since 4-12 weeks finished 02/20/2011   S/P radiation therapy    Right Breast High Axilla and Supraclavicular gegion: 4500 cGy/25 Fractions with boost for a total of 6300 cGy   Sleep apnea sleep study 09/18/2005   uses CPAP occ. - to bring machine DOS   Spondylosis    cervical and lumbar   Use of letrozole  (Femara ) start 07/21/12    SURGICAL HISTORY: Past Surgical History:  Procedure Laterality Date   ABDOMINAL HYSTERECTOMY     BILATERAL OOPHORECTOMY  2009   Boil removal under arm Right    BREAST BIOPSY Bilateral 1998   BREAST BIOPSY Right 08/11/2018   FIBROADENOMATOID NODULE WITH CALCIFICATIONS    BREAST LUMPECTOMY Right 03/2012   CARDIAC CATHETERIZATION   04/10/2010, 05/07/2004   LAD lesion, blood flow OK, per pt.   CARDIOVASCULAR STRESS TEST  02/15/2018   CARPAL TUNNEL RELEASE     left   CARPAL TUNNEL RELEASE Right    CATARACT EXTRACTION, BILATERAL     COLONOSCOPY  12/20/2012   Procedure: COLONOSCOPY;  Surgeon: Princella CHRISTELLA Nida, MD;  Location: [facility];  Service: Endoscopy;  Laterality: N/A;   ESOPHAGOGASTRODUODENOSCOPY  12/20/2012   Procedure: ESOPHAGOGASTRODUODENOSCOPY (EGD);  Surgeon: Princella CHRISTELLA Nida, MD;  Location: [facility];  Service: Endoscopy;  Laterality: N/A;   LESION REMOVAL  11/27/2011   Procedure: LESION REMOVAL;  Surgeon: Elon CHRISTELLA Pacini, MD;  Location: [facility];  Service: General;  Laterality: Right;  Skin tag removed from under right eye. No specimen   LUMBAR LAMINECTOMY     back surg. x 2 - 1980's and 1999   NM MYOVIEW  LTD  08/02/2012   no ischemia   PARTIAL HYSTERECTOMY     BSO   PORTACATH PLACEMENT  11/27/2011   Procedure: INSERTION PORT-A-CATH;  Surgeon: Elon CHRISTELLA Pacini, MD;  Location: [facility];  Service: General;  Laterality: Right;   TOTAL HIP ARTHROPLASTY Right 03/17/2018   Procedure: RIGHT TOTAL HIP ARTHROPLASTY ANTERIOR APPROACH;  Surgeon: Fidel Rogue, MD;  Location: [facility];  Service: Orthopedics;  Laterality: Right;  Needs RNFA   TOTAL KNEE ARTHROPLASTY  07/28/2006   right   US  ECHOCARDIOGRAPHY  08/14/2008   technically difficult-mild LVH, EF =>55%,mild annular ca+,AOV mildly sclerotic    SOCIAL HISTORY: Social History   Socioeconomic History   Marital status: Single    Spouse name: Not on file   Number of children: Not on file   Years of education: Not on file   Highest education level: Not on file  Occupational History   Occupation: retired    Associate Professor: RETIRED  Tobacco Use   Smoking status: Never   Smokeless tobacco: Never  Vaping Use   Vaping status: Never Used  Substance and Sexual Activity   Alcohol  use: No    Alcohol /week: 0.0 standard drinks of alcohol     Drug use: No   Sexual activity: Not Currently    Birth control/protection: Surgical  Other Topics Concern   Not on file  Social History Narrative   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 states she is not aware of any family Gutierrez. prob., does not know where this comment came from   Breast cancer Cousin    Ovarian cancer Cousin 40 - 50   Ovarian cancer Cousin    Ovarian cancer Niece 42   Colon cancer Neg Hx    Esophageal cancer Neg Hx    Rectal cancer Neg Hx    Stomach cancer Neg Hx     Review of Systems  Constitutional:  Negative for appetite change, chills, fatigue, fever and unexpected weight change.  HENT:   Negative for hearing loss, lump/mass and trouble swallowing.   Eyes:  Negative for eye problems and icterus.  Respiratory:  Negative for chest tightness, cough and shortness of breath.   Cardiovascular:  Negative for chest pain, leg swelling and palpitations.  Gastrointestinal:  Negative for abdominal distention, abdominal pain, constipation, diarrhea, nausea and vomiting.  Endocrine: Negative for hot flashes.  Genitourinary:  Negative for difficulty urinating.   Musculoskeletal:  Negative for arthralgias.  Skin:  Negative for itching and rash.  Neurological:  Negative for dizziness, extremity weakness, headaches and numbness.  Hematological:  Negative for adenopathy. Does not bruise/bleed easily.  Psychiatric/Behavioral:  Negative  for depression. The patient is not nervous/anxious.       PHYSICAL EXAMINATION    Vitals:   09/19/24 0952  BP: (!) 156/74  Pulse: 86  Resp: 18  Temp: 98.6 F (37 C)  SpO2: 94%    Physical Exam Constitutional:      General: She is not in acute distress.    Appearance: Normal appearance. She  is not toxic-appearing.  HENT:     Head: Normocephalic and atraumatic.     Mouth/Throat:     Mouth: Mucous membranes are moist.     Pharynx: Oropharynx is clear. No oropharyngeal exudate or posterior oropharyngeal erythema.  Eyes:     General: No scleral icterus. Cardiovascular:     Rate and Rhythm: Normal rate and regular rhythm.     Pulses: Normal pulses.     Heart sounds: Normal heart sounds.  Pulmonary:     Effort: Pulmonary effort is normal.     Breath sounds: Normal breath sounds.  Chest:     Comments: Right breast s/p lumpectomy and radiation, no sign of local recurrence; left breast benign Abdominal:     General: Abdomen is flat. Bowel sounds are normal. There is no distension.     Palpations: Abdomen is soft.     Tenderness: There is no abdominal tenderness.  Musculoskeletal:        General: No swelling.     Cervical back: Neck supple.  Lymphadenopathy:     Cervical: No cervical adenopathy.  Skin:    General: Skin is warm and dry.     Findings: No rash.  Neurological:     General: No focal deficit present.     Mental Status: She is alert.  Psychiatric:        Mood and Affect: Mood normal.        Behavior: Behavior normal.        ASSESSMENT and THERAPY PLAN:   Assessment and Plan Assessment & Plan History of malignant neoplasm of right breast Right breast cancer treated with chemotherapy, lumpectomy, radiation, and letrozole . No recurrence observed. Last mammogram in June 2024 negative for malignancy. - Order mammogram in next few weeks. - Encourage regular physical activity. - Advise intake of at least five servings of fruits and vegetables daily. -  Schedule follow-up in one year.    All questions were answered. The patient knows to call the clinic with any problems, questions or concerns. We can certainly see the patient much sooner if necessary.  Total encounter time:20 minutes*in face-to-face visit time, chart review, lab review, care coordination, order entry, and documentation of the encounter time.    Jasmine Kendall, NP 09/19/24 10:16 AM Medical Oncology and Hematology [facility] [address] [place], [place] [address] Tel. [phone]    Fax. [phone]  *Total Encounter Time as defined by the Centers for Medicare and Medicaid Services includes, in addition to the face-to-face time of a patient visit (documented in the note above) non-face-to-face time: obtaining and reviewing outside history, ordering and reviewing medications, tests or procedures, care coordination (communications with other health care professionals or caregivers) and documentation in the medical record.

## 2024-09-27 ENCOUNTER — Other Ambulatory Visit (HOSPITAL_BASED_OUTPATIENT_CLINIC_OR_DEPARTMENT_OTHER): Payer: Self-pay

## 2024-09-27 MED ORDER — COMIRNATY 30 MCG/0.3ML IM SUSY
0.3000 mL | PREFILLED_SYRINGE | Freq: Once | INTRAMUSCULAR | 0 refills | Status: AC
  Filled 2024-09-27: qty 0.3, 1d supply, fill #0

## 2024-10-15 DIAGNOSIS — G4733 Obstructive sleep apnea (adult) (pediatric): Secondary | ICD-10-CM | POA: Diagnosis not present

## 2024-10-16 DIAGNOSIS — H43813 Vitreous degeneration, bilateral: Secondary | ICD-10-CM | POA: Diagnosis not present

## 2024-10-16 DIAGNOSIS — Z961 Presence of intraocular lens: Secondary | ICD-10-CM | POA: Diagnosis not present

## 2024-10-16 DIAGNOSIS — R7303 Prediabetes: Secondary | ICD-10-CM | POA: Diagnosis not present

## 2024-10-30 ENCOUNTER — Ambulatory Visit
Admission: RE | Admit: 2024-10-30 | Discharge: 2024-10-30 | Disposition: A | Discharge status: Home / Self Care | Source: Ambulatory Visit | Attending: Adult Health | Admitting: Adult Health

## 2024-10-30 DIAGNOSIS — Z1231 Encounter for screening mammogram for malignant neoplasm of breast: Secondary | ICD-10-CM | POA: Diagnosis not present

## 2024-10-30 DIAGNOSIS — Z853 Personal history of malignant neoplasm of breast: Secondary | ICD-10-CM

## 2024-11-06 ENCOUNTER — Encounter: Payer: Self-pay | Admitting: Cardiovascular Disease

## 2024-11-06 ENCOUNTER — Ambulatory Visit: Attending: Cardiovascular Disease | Admitting: Cardiovascular Disease

## 2024-11-06 VITALS — BP 138/65 | HR 81 | Ht 65.0 in | Wt 215.0 lb

## 2024-11-06 DIAGNOSIS — R0602 Shortness of breath: Secondary | ICD-10-CM | POA: Diagnosis not present

## 2024-11-06 DIAGNOSIS — I2510 Atherosclerotic heart disease of native coronary artery without angina pectoris: Secondary | ICD-10-CM | POA: Diagnosis not present

## 2024-11-06 DIAGNOSIS — E782 Mixed hyperlipidemia: Secondary | ICD-10-CM

## 2024-11-06 DIAGNOSIS — T466X5A Adverse effect of antihyperlipidemic and antiarteriosclerotic drugs, initial encounter: Secondary | ICD-10-CM | POA: Diagnosis not present

## 2024-11-06 DIAGNOSIS — I10 Essential (primary) hypertension: Secondary | ICD-10-CM

## 2024-11-06 DIAGNOSIS — G720 Drug-induced myopathy: Secondary | ICD-10-CM

## 2024-11-06 DIAGNOSIS — G4733 Obstructive sleep apnea (adult) (pediatric): Secondary | ICD-10-CM

## 2024-11-06 DIAGNOSIS — Z6835 Body mass index (BMI) 35.0-35.9, adult: Secondary | ICD-10-CM | POA: Diagnosis not present

## 2024-11-06 DIAGNOSIS — R7303 Prediabetes: Secondary | ICD-10-CM | POA: Diagnosis not present

## 2024-11-06 NOTE — Patient Instructions (Signed)
 Medication Instructions:  Your physician recommends that you continue on your current medications as directed. Please refer to the Current Medication list given to you today.  *If you need a refill on your cardiac medications before your next appointment, please call your pharmacy*  Follow-Up: At [facility], you and your health needs are our priority.  As part of our continuing mission to provide you with exceptional heart care, our providers are all part of one team.  This team includes your primary Cardiologist (physician) and Advanced Practice Providers or APPs (Physician Assistants and Nurse Practitioners) who all work together to provide you with the care you need, when you need it.  Your next appointment:   1 year(s)   Provider:   Jerel Balding, MD

## 2024-11-06 NOTE — Progress Notes (Unsigned)
 Patient ID: [name], female   DOB: 09-14-1941, 83 y.o.   MRN: [id number]    Cardiology Office Note    Date:  11/07/2024   ID:  Tammie, Yanda 1941/04/24, MRN [id number]  PCP:  Shayne Anes, MD  Cardiologist:    Jerel Balding, MD   No chief complaint on file.   History of Present Illness:  [name] is a 83 y.o. female with a history of moderate coronary artery disease, hypertension, hyperlipidemia, severe obesity, reactive airway disease, right total hip replacement and treated hypothyroidism.  Showed evidence of calcified coronary atherosclerosis on a coronary CT angiogram from 2011, and had a moderate stenosis in the LAD artery by invasive coronary angiography in 2011, not hemodynamically significant by pressure wire analysis.  She had low risk nuclear stress test in 2016, 2019 and 2021, although the last study did show a small and mild reversible apical septal defect.  Normal left ventricular systolic function by echocardiogram in December 2023.  Generally doing okay.  She is using CPAP again and this has led to improved symptoms.  Has mild edema in the left ankle chronically.  Does not have orthopnea or PND and has stable NYHA functional class II exertional dyspnea.  Has not required additional diuretics.  Denies dizziness or syncope or palpitations.  Diltiazem  does a great job controlling her rhythm problems.  Occasionally has worsening shortness of breath, especially when the weather changes when in the springtime.  She has a history of statin myopathy but good LDL control on Repatha.  Most recent cholesterol was 196, HDL 85, LDL 93, triglycerides 91.  Hemoglobin A1c is in prediabetes range at 5.9%.  On methotrexate for rheumatoid arthritis.  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  She had an intermediate severity stenosis of the mid LAD artery. This was demonstrated by coronary angiography in 2011, but shown not to be  hemodynamically important by pressure wire analysis. She had some problems with intrascapular pressure in 2013 and in 2019 had nuclear stress tests that showed normal myocardial perfusion and normal left ventricular systolic function.  The nuclear stress test in 2021 showed a questionable small mild defect in the apical septal distribution, but the study was hard to interpret due to severe extracardiac tracer uptake.  Wall motion and EF were normal on echocardiogram December 2023.  Past Medical History:  Diagnosis Date   Anemia    history of   Arthritis    osteo - s/p right total knee   Asthma    daily and prn inhalers   Boil, axilla    Breast cancer (HCC) 10/28/11   right breast   Breast cancer of upper-outer quadrant of right female breast (HCC) 11/04/2011   2.5 x 2.4 x 1.7 cm biopsy-proven invasive ductal carcinoma and ductal carcinoma in situ in the anterior aspect of the upper outer  quadrant of the right breast. There are associated linear and nodular enhancing masses extending from this mass to the  nipple/areolar complex with abnormal thickening and enhancement of the nipple and areola, compatible with extension of malignancy into  the nipple/ar   CAD (coronary artery disease)    Colon polyp    GERD (gastroesophageal reflux disease)    Grave's disease    no current meds.   H pylori ulcer    Heart murmur    History of blood transfusion 2006   History of chemotherapy    neoadjuvant: 5 cycles of  Taxol/carboplatinum:  last dose 02/24/12   History of hiatal hernia    Hyperlipidemia    Hypertension    under control with med.   Hypothyroid    Liver cyst    History of    Pre-diabetes    RA (rheumatoid arthritis) (HCC)    S/P chemotherapy, time since 4-12 weeks finished 02/20/2011   S/P radiation therapy    Right Breast High Axilla and Supraclavicular gegion: 4500 cGy/25 Fractions with boost for a total of 6300 cGy   Sleep apnea sleep study 09/18/2005   uses CPAP occ. - to bring  machine DOS   Spondylosis    cervical and lumbar   Use of letrozole  (Femara ) start 07/21/12    Past Surgical History:  Procedure Laterality Date   ABDOMINAL HYSTERECTOMY     BILATERAL OOPHORECTOMY  2009   Boil removal under arm Right    BREAST BIOPSY Bilateral 1998   BREAST BIOPSY Right 08/11/2018   FIBROADENOMATOID NODULE WITH CALCIFICATIONS    BREAST LUMPECTOMY Right 03/2012   CARDIAC CATHETERIZATION  04/10/2010, 05/07/2004   LAD lesion, blood flow OK, per pt.   CARDIOVASCULAR STRESS TEST  02/15/2018   CARPAL TUNNEL RELEASE     left   CARPAL TUNNEL RELEASE Right    CATARACT EXTRACTION, BILATERAL     COLONOSCOPY  12/20/2012   Procedure: COLONOSCOPY;  Surgeon: Princella CHRISTELLA Nida, MD;  Location: [facility];  Service: Endoscopy;  Laterality: N/A;   ESOPHAGOGASTRODUODENOSCOPY  12/20/2012   Procedure: ESOPHAGOGASTRODUODENOSCOPY (EGD);  Surgeon: Princella CHRISTELLA Nida, MD;  Location: [facility];  Service: Endoscopy;  Laterality: N/A;   LESION REMOVAL  11/27/2011   Procedure: LESION REMOVAL;  Surgeon: Elon CHRISTELLA Pacini, MD;  Location: [facility];  Service: General;  Laterality: Right;  Skin tag removed from under right eye. No specimen   LUMBAR LAMINECTOMY     back surg. x 2 - 1980's and 1999   NM MYOVIEW  LTD  08/02/2012   no ischemia   PARTIAL HYSTERECTOMY     BSO   PORTACATH PLACEMENT  11/27/2011   Procedure: INSERTION PORT-A-CATH;  Surgeon: Elon CHRISTELLA Pacini, MD;  Location: [facility];  Service: General;  Laterality: Right;   TOTAL HIP ARTHROPLASTY Right 03/17/2018   Procedure: RIGHT TOTAL HIP ARTHROPLASTY ANTERIOR APPROACH;  Surgeon: Fidel Rogue, MD;  Location: [facility];  Service: Orthopedics;  Laterality: Right;  Needs RNFA   TOTAL KNEE ARTHROPLASTY  07/28/2006   right   US  ECHOCARDIOGRAPHY  08/14/2008   technically difficult-mild LVH, EF =>55%,mild annular ca+,AOV mildly sclerotic    Current Medications: Outpatient Medications Prior to Visit  Medication Sig  Dispense Refill   albuterol (VENTOLIN HFA) 108 (90 Base) MCG/ACT inhaler Inhale 2 puffs into the lungs every 4 (four) hours as needed.     aspirin  81 MG EC tablet Take 81 mg by mouth daily.     B-D TB SYRINGE 1CC/27GX1/2 27G X 1/2 1 ML MISC once a week.     Cholecalciferol  (VITAMIN D -3) 5000 units TABS Take 5,000 Units by mouth daily.     Cyanocobalamin (B-12) 1000 MCG TABS Take 1,000 mcg by mouth daily.     diclofenac sodium (VOLTAREN) 1 % GEL Apply 2-4 g topically 4 (four) times daily as needed. For joint pain.  3   Evolocumab (REPATHA SURECLICK) 140 MG/ML SOAJ as directed Subcutaneous every two weeks as directed 140 mg sub q with each dose for 90 days     ezetimibe  (ZETIA ) 10 MG  tablet Take 10 mg by mouth every evening.      Fluticasone-Salmeterol (ADVAIR) 100-50 MCG/DOSE AEPB Inhale 1 puff into the lungs daily.      gabapentin  (NEURONTIN ) 100 MG capsule Take 3 capsules (300 mg total) by mouth at bedtime. (Patient taking differently: Take 100 mg by mouth at bedtime.)     ipratropium (ATROVENT HFA) 17 MCG/ACT inhaler Inhale 2 puffs into the lungs every 6 (six) hours.     levothyroxine  (SYNTHROID , LEVOTHROID) 75 MCG tablet Take 75 mcg by mouth daily before breakfast. Taking 150 mcg on Sundays and Saturdays and 75 mcg Monday-Fridays.  2   Methotrexate (XATMEP PO) Take 0.6 mg by mouth once a week.     omeprazole  (PRILOSEC) 40 MG capsule TAKE 1 CAPSULE (40 MG TOTAL) BY MOUTH DAILY. 90 capsule 1   TIADYLT  ER 360 MG 24 hr capsule Take 360 mg by mouth at bedtime.     Tiotropium Bromide  Monohydrate (SPIRIVA  RESPIMAT IN) Spiriva  with HandiHaler     triamterene -hydrochlorothiazide  (MAXZIDE -25) 37.5-25 MG tablet TAKE ONE TABLET BY MOUTH EVERY MORNING 90 tablet 3   folic acid  (FOLVITE ) 1 MG tablet Take 1 mg by mouth daily.     traMADol  (ULTRAM ) 50 MG tablet Take 50 mg by mouth daily as needed. (Patient not taking: Reported on 11/06/2024)     No facility-administered medications prior to visit.      Allergies:   Crestor  [rosuvastatin ], Lactose intolerance (gi), Lipitor [atorvastatin calcium ], and Zocor [simvastatin - high dose]   Social History   Socioeconomic History   Marital status: Single    Spouse name: Not on file   Number of children: Not on file   Years of education: Not on file   Highest education level: Not on file  Occupational History   Occupation: retired    Associate Professor: RETIRED  Tobacco Use   Smoking status: Never   Smokeless tobacco: Never  Vaping Use   Vaping status: Never Used  Substance and Sexual Activity   Alcohol  use: No    Alcohol /week: 0.0 standard drinks of alcohol    Drug use: No   Sexual activity: Not Currently    Birth control/protection: Surgical  Other Topics Concern   Not on file  Social History Narrative   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, single, 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   ROS:   Please see the history of present illness.    ROS All other systems are reviewed and are negative.   PHYSICAL EXAM:   VS:  BP 138/65   Pulse 81   Ht 5' 5 (1.651 m)   Wt 215 lb (97.5 kg)   SpO2 97%   BMI 35.78 kg/m      General: Alert, oriented x3, no distress, moderate to severely obese Head: no evidence of trauma, PERRL, EOMI, no exophtalmos or lid lag, no myxedema, no xanthelasma; normal  ears, nose and oropharynx Neck: normal jugular venous pulsations and no hepatojugular  reflux; brisk carotid pulses without delay and no carotid bruits Chest: clear to auscultation, no signs of consolidation by percussion or palpation, normal fremitus, symmetrical and full respiratory excursions Cardiovascular: normal position and quality of the apical impulse, regular rhythm, normal first and second heart sounds, no murmurs, rubs or gallops Abdomen: no tenderness or distention, no masses by palpation, no abnormal pulsatility or arterial bruits, normal bowel sounds, no hepatosplenomegaly Extremities: no clubbing, cyanosis or edema; 2+ radial, ulnar and brachial pulses bilaterally; 2+ right femoral, posterior tibial and dorsalis pedis pulses; 2+ left femoral, posterior tibial and dorsalis pedis pulses; no subclavian or femoral bruits Neurological: grossly nonfocal Psych: Normal mood and affect     Wt Readings from Last 3 Encounters:  11/06/24 215 lb (97.5 kg)  09/19/24 215 lb (97.5 kg)  07/11/24 219 lb 9.6 oz (99.6 kg)      Studies/Labs Reviewed:   EKG:    EKG Interpretation Date/Time:  Monday November 06 2024 14:56:49 EST Ventricular Rate:  81 PR Interval:  220 QRS Duration:  90 QT Interval:  356 QTC Calculation: 413 R Axis:   -14  Text Interpretation: Sinus rhythm with 1st degree A-V block Cannot rule out Anterior infarct , age undetermined When compared with ECG of 27-Nov-2011 14:16, No significant change was found Confirmed by Jozey Janco [phone]) on 11/06/2024 3:25:24 PM        Recent Labs: 12/20/2019 Hemoglobin A1c 5.8%, TSH 0.28 12/01/2019 Creatinine 0.85, normal liver function tests   01/02/2021 Creatinine 1.13 02/28/2021 Potassium 3.9, normal liver function tests, TSH 2.25, hemoglobin A1c 5.9%  BMET  01/24/2024 Hemoglobin A1c 5.9% Cholesterol 196, HDL 85, LDL 93, triglycerides 91  90/77/7974 TSH 2.390, potassium 3.8, creatinine 0.89, estimated GFR 65, hemoglobin 12.8 ASSESSMENT:    1. SOB (shortness of breath)   2. Coronary artery  disease involving native coronary artery of native heart without angina pectoris   3. Mixed hyperlipidemia   4. Statin myopathy   5. Prediabetes   6. OSA (obstructive sleep apnea)   7. Severe obesity (BMI 35.0-35.9 with comorbidity) (HCC)   8. Essential hypertension      PLAN:  In order of problems listed above:  Exertional dyspnea: At risk for HFpEF due to her age and obesity.  It has always been difficult to distinguish pulmonary versus cardiac etiology.  Obesity is contributory and her symptoms are often seasonal and responsive to bronchodilators.  Last year we checked a BNP and it was completely normal and the echocardiogram showed no evidence of volume overload.  In addition her mitral annulus diastolic velocities are quite normal, showing better than expected diastolic function for age and her left atrium is not dilated.  Currently doing well without loop diuretics, without overt signs of hypervolemia.  She also has a history of chest radiation, ongoing treatment with methotrexate, history of sleep apnea with inconsistent use of CPAP.  No indication she requires treatment for diastolic heart failure at this time. CAD: Denies angina at this time.  Has a known moderate stenosis in the LAD artery.   HLP: Ideally would like her LDL cholesterol less than 70, but she is already on Repatha and ezetimibe  and is intolerant to multiple statins due to myopathy.  Untreated LDL cholesterol was around 141. preDM: Good glycemic control with hemoglobin A1c 5.9% without medications. OSA: Encouraged continued 100% compliance with CPAP.  Wearing CPAP has actually improved her symptoms. Obesity: Ideally would lose  quite a bit of weight to help with her breathing and her metabolic abnormalities. HTN: Well-controlled.  Continue same medications.  Diltiazem  has also been beneficial for her palpitations.  On combined diuretic as well.   Medication Adjustments/Labs and Tests Ordered: Current medicines are  reviewed at length with the patient today.  Concerns regarding medicines are outlined above.  Medication changes, Labs and Tests ordered today are listed in the Patient Instructions below. Patient Instructions  Medication Instructions:  Your physician recommends that you continue on your current medications as directed. Please refer to the Current Medication list given to you today.  *If you need a refill on your cardiac medications before your next appointment, please call your pharmacy*   Follow-Up: At [facility], you and your health needs are our priority.  As part of our continuing mission to provide you with exceptional heart care, our providers are all part of one team.  This team includes your primary Cardiologist (physician) and Advanced Practice Providers or APPs (Physician Assistants and Nurse Practitioners) who all work together to provide you with the care you need, when you need it.  Your next appointment:   1 year(s)  Provider:   Jerel Balding, MD             Signed, Jerel Balding, MD  11/07/2024 2:08 PM    [facility] [address] [place], [place], [place]  [address] Phone: [phone]; Fax: [phone]

## 2024-11-15 ENCOUNTER — Ambulatory Visit: Admitting: Cardiovascular Disease

## 2024-11-27 NOTE — Progress Notes (Signed)
 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 The change in variant classification was made as a result of re-review of the evidence in light of new variant interpretation guidelines and/or new information. The amended report date is 11/20/2024.

## 2024-12-03 ENCOUNTER — Other Ambulatory Visit: Payer: Self-pay | Admitting: Gastroenterology

## 2025-09-19 ENCOUNTER — Encounter: Admitting: Adult Health
